# Patient Record
Sex: Male | Born: 1950 | Race: Black or African American | Hispanic: No | Marital: Single | Smoking: Former smoker
Health system: Southern US, Community
[De-identification: ages and names within clinical notes are randomized; demographics above are authoritative.]

## PROBLEM LIST (undated history)

## (undated) DIAGNOSIS — N2889 Other specified disorders of kidney and ureter: Secondary | ICD-10-CM

## (undated) DIAGNOSIS — C3490 Malignant neoplasm of unspecified part of unspecified bronchus or lung: Secondary | ICD-10-CM

## (undated) DIAGNOSIS — C189 Malignant neoplasm of colon, unspecified: Secondary | ICD-10-CM

## (undated) DIAGNOSIS — E119 Type 2 diabetes mellitus without complications: Secondary | ICD-10-CM

## (undated) DIAGNOSIS — J449 Chronic obstructive pulmonary disease, unspecified: Secondary | ICD-10-CM

## (undated) DIAGNOSIS — I639 Cerebral infarction, unspecified: Secondary | ICD-10-CM

## (undated) DIAGNOSIS — K219 Gastro-esophageal reflux disease without esophagitis: Secondary | ICD-10-CM

## (undated) DIAGNOSIS — J9690 Respiratory failure, unspecified, unspecified whether with hypoxia or hypercapnia: Secondary | ICD-10-CM

## (undated) DIAGNOSIS — Z8709 Personal history of other diseases of the respiratory system: Secondary | ICD-10-CM

## (undated) DIAGNOSIS — H409 Unspecified glaucoma: Secondary | ICD-10-CM

## (undated) DIAGNOSIS — I10 Essential (primary) hypertension: Secondary | ICD-10-CM

## (undated) DIAGNOSIS — C801 Malignant (primary) neoplasm, unspecified: Secondary | ICD-10-CM

## (undated) DIAGNOSIS — C8590 Non-Hodgkin lymphoma, unspecified, unspecified site: Secondary | ICD-10-CM

## (undated) DIAGNOSIS — G479 Sleep disorder, unspecified: Secondary | ICD-10-CM

## (undated) DIAGNOSIS — IMO0001 Reserved for inherently not codable concepts without codable children: Secondary | ICD-10-CM

## (undated) DIAGNOSIS — I499 Cardiac arrhythmia, unspecified: Secondary | ICD-10-CM

## (undated) DIAGNOSIS — C649 Malignant neoplasm of unspecified kidney, except renal pelvis: Secondary | ICD-10-CM

## (undated) DIAGNOSIS — Z9981 Dependence on supplemental oxygen: Secondary | ICD-10-CM

## (undated) DIAGNOSIS — J45909 Unspecified asthma, uncomplicated: Secondary | ICD-10-CM

## (undated) HISTORY — DX: Malignant (primary) neoplasm, unspecified: C80.1

## (undated) HISTORY — DX: Essential (primary) hypertension: I10

## (undated) HISTORY — DX: Unspecified glaucoma: H40.9

## (undated) HISTORY — DX: Non-Hodgkin lymphoma, unspecified, unspecified site: C85.90

## (undated) HISTORY — DX: Respiratory failure, unspecified, unspecified whether with hypoxia or hypercapnia: J96.90

## (undated) HISTORY — DX: Malignant neoplasm of unspecified part of unspecified bronchus or lung: C34.90

## (undated) HISTORY — DX: Malignant neoplasm of colon, unspecified: C18.9

## (undated) HISTORY — DX: Chronic obstructive pulmonary disease, unspecified: J44.9

## (undated) HISTORY — DX: Malignant neoplasm of unspecified kidney, except renal pelvis: C64.9

## (undated) HISTORY — DX: Type 2 diabetes mellitus without complications: E11.9

## (undated) HISTORY — DX: Cerebral infarction, unspecified: I63.9

---

## 1989-06-15 DIAGNOSIS — I639 Cerebral infarction, unspecified: Secondary | ICD-10-CM

## 1989-06-15 HISTORY — DX: Cerebral infarction, unspecified: I63.9

## 2004-10-18 ENCOUNTER — Ambulatory Visit: Payer: Self-pay | Admitting: Internal Medicine

## 2004-10-31 ENCOUNTER — Emergency Department (HOSPITAL_COMMUNITY): Admission: EM | Admit: 2004-10-31 | Discharge: 2004-10-31 | Payer: Self-pay | Admitting: Emergency Medicine

## 2005-01-04 ENCOUNTER — Emergency Department: Payer: Self-pay | Admitting: Internal Medicine

## 2005-06-12 ENCOUNTER — Emergency Department: Payer: Self-pay | Admitting: Internal Medicine

## 2005-06-12 ENCOUNTER — Other Ambulatory Visit: Payer: Self-pay

## 2006-05-23 ENCOUNTER — Other Ambulatory Visit: Payer: Self-pay

## 2006-05-23 ENCOUNTER — Emergency Department: Payer: Self-pay | Admitting: Emergency Medicine

## 2007-03-31 ENCOUNTER — Ambulatory Visit: Payer: Self-pay | Admitting: Internal Medicine

## 2007-03-31 ENCOUNTER — Inpatient Hospital Stay: Payer: Self-pay | Admitting: Surgery

## 2007-07-30 ENCOUNTER — Emergency Department (HOSPITAL_COMMUNITY): Admission: EM | Admit: 2007-07-30 | Discharge: 2007-07-30 | Payer: Self-pay | Admitting: Emergency Medicine

## 2007-09-01 ENCOUNTER — Emergency Department: Payer: Self-pay | Admitting: Emergency Medicine

## 2007-11-07 ENCOUNTER — Ambulatory Visit: Payer: Self-pay | Admitting: Gastroenterology

## 2010-01-20 ENCOUNTER — Ambulatory Visit: Payer: Self-pay | Admitting: Family Medicine

## 2010-01-26 ENCOUNTER — Ambulatory Visit: Payer: Self-pay | Admitting: Family Medicine

## 2010-02-06 ENCOUNTER — Ambulatory Visit: Payer: Self-pay | Admitting: Gastroenterology

## 2010-02-22 ENCOUNTER — Ambulatory Visit: Payer: Self-pay | Admitting: Specialist

## 2010-03-15 ENCOUNTER — Ambulatory Visit: Payer: Self-pay | Admitting: Cardiothoracic Surgery

## 2010-04-10 ENCOUNTER — Ambulatory Visit: Payer: Self-pay | Admitting: Specialist

## 2010-04-12 ENCOUNTER — Ambulatory Visit: Payer: Self-pay | Admitting: Cardiothoracic Surgery

## 2010-04-14 ENCOUNTER — Ambulatory Visit: Payer: Self-pay | Admitting: Cardiothoracic Surgery

## 2010-04-19 ENCOUNTER — Ambulatory Visit: Payer: Self-pay | Admitting: Specialist

## 2010-04-19 ENCOUNTER — Ambulatory Visit: Payer: Self-pay | Admitting: Oncology

## 2010-04-20 ENCOUNTER — Ambulatory Visit: Payer: Self-pay | Admitting: Cardiothoracic Surgery

## 2010-04-25 ENCOUNTER — Ambulatory Visit: Payer: Self-pay | Admitting: Oncology

## 2010-05-15 ENCOUNTER — Ambulatory Visit: Payer: Self-pay | Admitting: Cardiothoracic Surgery

## 2010-05-15 ENCOUNTER — Ambulatory Visit: Payer: Self-pay | Admitting: Oncology

## 2010-05-22 ENCOUNTER — Ambulatory Visit: Payer: Self-pay | Admitting: Surgery

## 2010-05-26 ENCOUNTER — Ambulatory Visit: Payer: Self-pay | Admitting: Surgery

## 2010-06-14 ENCOUNTER — Emergency Department: Payer: Self-pay | Admitting: Emergency Medicine

## 2010-06-15 ENCOUNTER — Ambulatory Visit: Payer: Self-pay | Admitting: Oncology

## 2010-06-15 ENCOUNTER — Ambulatory Visit: Payer: Self-pay | Admitting: Cardiothoracic Surgery

## 2010-07-12 ENCOUNTER — Inpatient Hospital Stay: Payer: Self-pay | Admitting: Internal Medicine

## 2010-07-12 DIAGNOSIS — J9690 Respiratory failure, unspecified, unspecified whether with hypoxia or hypercapnia: Secondary | ICD-10-CM

## 2010-07-12 HISTORY — DX: Respiratory failure, unspecified, unspecified whether with hypoxia or hypercapnia: J96.90

## 2010-07-15 ENCOUNTER — Ambulatory Visit: Payer: Self-pay | Admitting: Oncology

## 2010-07-15 ENCOUNTER — Ambulatory Visit: Payer: Self-pay | Admitting: Cardiothoracic Surgery

## 2010-08-15 ENCOUNTER — Ambulatory Visit: Payer: Self-pay | Admitting: Oncology

## 2010-08-15 ENCOUNTER — Ambulatory Visit: Payer: Self-pay | Admitting: Cardiothoracic Surgery

## 2010-09-14 ENCOUNTER — Ambulatory Visit: Payer: Self-pay | Admitting: Cardiothoracic Surgery

## 2010-09-14 ENCOUNTER — Ambulatory Visit: Payer: Self-pay | Admitting: Oncology

## 2010-10-15 ENCOUNTER — Ambulatory Visit: Payer: Self-pay | Admitting: Cardiothoracic Surgery

## 2010-10-15 ENCOUNTER — Ambulatory Visit: Payer: Self-pay | Admitting: Oncology

## 2010-11-15 ENCOUNTER — Ambulatory Visit: Payer: Self-pay | Admitting: Oncology

## 2010-12-14 ENCOUNTER — Ambulatory Visit: Payer: Self-pay | Admitting: Oncology

## 2011-01-14 ENCOUNTER — Ambulatory Visit: Payer: Self-pay | Admitting: Oncology

## 2011-01-14 ENCOUNTER — Ambulatory Visit: Payer: Self-pay

## 2011-02-13 ENCOUNTER — Ambulatory Visit: Payer: Self-pay | Admitting: Oncology

## 2011-03-16 ENCOUNTER — Ambulatory Visit: Payer: Self-pay | Admitting: Oncology

## 2011-04-03 ENCOUNTER — Ambulatory Visit: Payer: Self-pay | Admitting: Oncology

## 2011-04-15 ENCOUNTER — Ambulatory Visit: Payer: Self-pay | Admitting: Oncology

## 2011-05-17 ENCOUNTER — Ambulatory Visit: Payer: Self-pay | Admitting: Oncology

## 2011-06-16 ENCOUNTER — Ambulatory Visit: Payer: Self-pay | Admitting: Oncology

## 2011-06-26 ENCOUNTER — Encounter: Payer: Self-pay | Admitting: Specialist

## 2011-07-16 ENCOUNTER — Ambulatory Visit: Payer: Self-pay | Admitting: Oncology

## 2011-07-16 ENCOUNTER — Encounter: Payer: Self-pay | Admitting: Specialist

## 2011-07-17 ENCOUNTER — Emergency Department: Payer: Self-pay | Admitting: Emergency Medicine

## 2011-08-16 ENCOUNTER — Ambulatory Visit: Payer: Self-pay | Admitting: Oncology

## 2011-08-16 ENCOUNTER — Encounter: Payer: Self-pay | Admitting: Specialist

## 2011-09-15 ENCOUNTER — Ambulatory Visit: Payer: Self-pay | Admitting: Oncology

## 2011-09-15 ENCOUNTER — Encounter: Payer: Self-pay | Admitting: Specialist

## 2011-10-16 ENCOUNTER — Ambulatory Visit: Payer: Self-pay | Admitting: Oncology

## 2011-10-16 ENCOUNTER — Encounter: Payer: Self-pay | Admitting: Specialist

## 2011-11-16 ENCOUNTER — Ambulatory Visit: Payer: Self-pay | Admitting: Oncology

## 2011-11-18 ENCOUNTER — Emergency Department: Payer: Self-pay | Admitting: *Deleted

## 2011-11-18 LAB — TROPONIN I: Troponin-I: <0.02 ng/mL

## 2011-11-18 LAB — CBC
HCT: 30.6 % — ABNORMAL LOW (ref 40.0–52.0)
HGB: 10.5 g/dL — ABNORMAL LOW (ref 13.0–18.0)
MCH: 28.7 pg (ref 26.0–34.0)
RBC: 3.65 10*6/uL — ABNORMAL LOW (ref 4.40–5.90)

## 2011-11-18 LAB — COMPREHENSIVE METABOLIC PANEL
Albumin: 2.9 g/dL — ABNORMAL LOW (ref 3.4–5.0)
BUN: 17 mg/dL (ref 7–18)
Bilirubin,Total: 0.3 mg/dL (ref 0.2–1.0)
Chloride: 101 mmol/L (ref 98–107)
EGFR (African American): >60
EGFR (Non-African Amer.): >60
Glucose: 154 mg/dL — ABNORMAL HIGH (ref 65–99)
Osmolality: 280 (ref 275–301)
Potassium: 3 mmol/L — ABNORMAL LOW (ref 3.5–5.1)
Sodium: 138 mmol/L (ref 136–145)
Total Protein: 6.7 g/dL (ref 6.4–8.2)

## 2011-11-18 LAB — CK TOTAL AND CKMB (NOT AT ARMC): CK, Total: 681 U/L — ABNORMAL HIGH (ref 35–232)

## 2011-11-18 LAB — PRO B NATRIURETIC PEPTIDE: B-Type Natriuretic Peptide: 1297 pg/mL — ABNORMAL HIGH (ref 0–125)

## 2011-12-27 ENCOUNTER — Ambulatory Visit: Payer: Self-pay | Admitting: Oncology

## 2011-12-27 LAB — COMPREHENSIVE METABOLIC PANEL
Alkaline Phosphatase: 89 U/L (ref 50–136)
Bilirubin,Total: 0.3 mg/dL (ref 0.2–1.0)
Calcium, Total: 8.7 mg/dL (ref 8.5–10.1)
Glucose: 119 mg/dL — ABNORMAL HIGH (ref 65–99)
Osmolality: 279 (ref 275–301)
Potassium: 3.7 mmol/L (ref 3.5–5.1)
SGOT(AST): 16 U/L (ref 15–37)
SGPT (ALT): 18 U/L
Sodium: 138 mmol/L (ref 136–145)

## 2011-12-27 LAB — CBC CANCER CENTER
Comment - H1-Com2: NORMAL
Eosinophil: 5 %
HGB: 10.4 g/dL — ABNORMAL LOW (ref 13.0–18.0)
MCV: 83 fL (ref 80–100)
Monocytes: 8 %
Platelet: 231 x10 3/mm (ref 150–440)
RBC: 3.85 10*6/uL — ABNORMAL LOW (ref 4.40–5.90)
Segmented Neutrophils: 60 %
Variant Lymphocyte: 3 %
WBC: 5.6 x10 3/mm (ref 3.8–10.6)

## 2011-12-31 ENCOUNTER — Ambulatory Visit: Payer: Self-pay | Admitting: Oncology

## 2012-01-01 LAB — FERRITIN: Ferritin (ARMC): 255 ng/mL (ref 8–388)

## 2012-01-01 LAB — IRON AND TIBC: Unbound Iron-Bind.Cap.: 225 ug/dL

## 2012-01-14 ENCOUNTER — Ambulatory Visit: Payer: Self-pay | Admitting: Oncology

## 2012-01-15 LAB — COMPREHENSIVE METABOLIC PANEL
Alkaline Phosphatase: 78 U/L (ref 50–136)
Bilirubin,Total: 0.4 mg/dL (ref 0.2–1.0)
Co2: 31 mmol/L (ref 21–32)
EGFR (African American): >60
EGFR (Non-African Amer.): >60
Potassium: 3.8 mmol/L (ref 3.5–5.1)
Sodium: 140 mmol/L (ref 136–145)
Total Protein: 7.3 g/dL (ref 6.4–8.2)

## 2012-01-15 LAB — CBC CANCER CENTER
Eosinophil: 3 %
HGB: 10.4 g/dL — ABNORMAL LOW (ref 13.0–18.0)
Lymphocytes: 20 %
MCH: 27.5 pg (ref 26.0–34.0)
MCHC: 33.4 g/dL (ref 32.0–36.0)
MCV: 83 fL (ref 80–100)
Platelet: 199 x10 3/mm (ref 150–440)
RDW: 16.6 % — ABNORMAL HIGH (ref 11.5–14.5)
Segmented Neutrophils: 69 %

## 2012-01-29 LAB — CBC CANCER CENTER
Basophil #: 0 x10 3/mm (ref 0.0–0.1)
HGB: 10.9 g/dL — ABNORMAL LOW (ref 13.0–18.0)
Lymphocyte %: 43.9 %
MCHC: 31.9 g/dL — ABNORMAL LOW (ref 32.0–36.0)
Monocyte #: 0.2 x10 3/mm (ref 0.2–1.0)
Monocyte %: 4.5 %
Platelet: 136 x10 3/mm — ABNORMAL LOW (ref 150–440)

## 2012-02-05 LAB — CBC CANCER CENTER
Basophil #: 0 x10 3/mm (ref 0.0–0.1)
Eosinophil #: 0.1 x10 3/mm (ref 0.0–0.7)
Eosinophil %: 2.2 %
HCT: 33.2 % — ABNORMAL LOW (ref 40.0–52.0)
HGB: 10.6 g/dL — ABNORMAL LOW (ref 13.0–18.0)
Lymphocyte #: 1.3 x10 3/mm (ref 1.0–3.6)
Lymphocyte %: 32.4 %
MCHC: 32 g/dL (ref 32.0–36.0)
MCV: 84 fL (ref 80–100)
Neutrophil #: 2.1 x10 3/mm (ref 1.4–6.5)
Neutrophil %: 52 %
Platelet: 116 x10 3/mm — ABNORMAL LOW (ref 150–440)
RDW: 17.2 % — ABNORMAL HIGH (ref 11.5–14.5)
WBC: 4.1 x10 3/mm (ref 3.8–10.6)

## 2012-02-05 LAB — COMPREHENSIVE METABOLIC PANEL WITH GFR
Albumin: 3.5 g/dL
Alkaline Phosphatase: 87 U/L
Anion Gap: 10
BUN: 20 mg/dL — ABNORMAL HIGH
Bilirubin,Total: 0.2 mg/dL
Calcium, Total: 8.6 mg/dL
Chloride: 101 mmol/L
Co2: 30 mmol/L
Creatinine: 1.38 mg/dL — ABNORMAL HIGH
EGFR (African American): >60
EGFR (Non-African Amer.): 55 — ABNORMAL LOW
Glucose: 121 mg/dL — ABNORMAL HIGH
Osmolality: 285
Potassium: 3.7 mmol/L
SGOT(AST): 14 U/L — ABNORMAL LOW
SGPT (ALT): 19 U/L
Sodium: 141 mmol/L
Total Protein: 7.2 g/dL

## 2012-02-05 LAB — HEMOGLOBIN A1C: Hemoglobin A1C: 6.9 % — ABNORMAL HIGH

## 2012-02-12 LAB — CBC CANCER CENTER
Basophil #: 0 x10 3/mm (ref 0.0–0.1)
Basophil %: 0.9 %
Eosinophil #: 0.1 x10 3/mm (ref 0.0–0.7)
Eosinophil %: 1.8 %
HCT: 31.9 % — ABNORMAL LOW (ref 40.0–52.0)
HGB: 10.3 g/dL — ABNORMAL LOW (ref 13.0–18.0)
Lymphocyte #: 1.4 x10 3/mm (ref 1.0–3.6)
Lymphocyte %: 40.2 %
MCH: 26.9 pg (ref 26.0–34.0)
MCHC: 32.2 g/dL (ref 32.0–36.0)
MCV: 84 fL (ref 80–100)
Monocyte #: 0.4 x10 3/mm (ref 0.2–1.0)
Monocyte %: 11.9 %
Neutrophil #: 1.5 x10 3/mm (ref 1.4–6.5)
Neutrophil %: 45.2 %
Platelet: 247 x10 3/mm (ref 150–440)
RBC: 3.81 10*6/uL — ABNORMAL LOW (ref 4.40–5.90)
RDW: 17.2 % — ABNORMAL HIGH (ref 11.5–14.5)
WBC: 3.4 x10 3/mm — ABNORMAL LOW (ref 3.8–10.6)

## 2012-02-12 LAB — BASIC METABOLIC PANEL
Anion Gap: 7 (ref 7–16)
BUN: 16 mg/dL (ref 7–18)
Calcium, Total: 8.7 mg/dL (ref 8.5–10.1)
Chloride: 102 mmol/L (ref 98–107)
Co2: 32 mmol/L (ref 21–32)
Creatinine: 1.18 mg/dL (ref 0.60–1.30)
EGFR (African American): >60
EGFR (Non-African Amer.): >60
Glucose: 141 mg/dL — ABNORMAL HIGH (ref 65–99)
Osmolality: 285 (ref 275–301)
Potassium: 3.7 mmol/L (ref 3.5–5.1)
Sodium: 141 mmol/L (ref 136–145)

## 2012-02-13 ENCOUNTER — Ambulatory Visit: Payer: Self-pay | Admitting: Oncology

## 2012-02-19 ENCOUNTER — Emergency Department: Payer: Self-pay | Admitting: Emergency Medicine

## 2012-02-19 LAB — CBC CANCER CENTER
HGB: 9.8 g/dL — ABNORMAL LOW (ref 13.0–18.0)
Lymphocyte #: 1 x10 3/mm (ref 1.0–3.6)
Lymphocyte %: 26.7 %
MCV: 85 fL (ref 80–100)
Monocyte #: 0.5 x10 3/mm (ref 0.2–1.0)
Monocyte %: 13 %
RBC: 3.62 10*6/uL — ABNORMAL LOW (ref 4.40–5.90)
RDW: 18.2 % — ABNORMAL HIGH (ref 11.5–14.5)

## 2012-02-19 LAB — COMPREHENSIVE METABOLIC PANEL
Anion Gap: 8 (ref 7–16)
BUN: 17 mg/dL (ref 7–18)
Bilirubin,Total: 0.2 mg/dL (ref 0.2–1.0)
Creatinine: 1.06 mg/dL (ref 0.60–1.30)
EGFR (African American): >60
EGFR (Non-African Amer.): >60
Glucose: 137 mg/dL — ABNORMAL HIGH (ref 65–99)
SGOT(AST): 20 U/L (ref 15–37)
SGPT (ALT): 25 U/L

## 2012-02-26 LAB — CBC CANCER CENTER
Basophil %: 0.4 %
Eosinophil #: 0.1 x10 3/mm (ref 0.0–0.7)
HGB: 10 g/dL — ABNORMAL LOW (ref 13.0–18.0)
Lymphocyte #: 0.7 x10 3/mm — ABNORMAL LOW (ref 1.0–3.6)
Lymphocyte %: 12.9 %
MCH: 27.1 pg (ref 26.0–34.0)
MCHC: 32.2 g/dL (ref 32.0–36.0)
MCV: 84 fL (ref 80–100)
Monocyte %: 5.9 %
Platelet: 121 x10 3/mm — ABNORMAL LOW (ref 150–440)
RBC: 3.71 10*6/uL — ABNORMAL LOW (ref 4.40–5.90)
RDW: 18.3 % — ABNORMAL HIGH (ref 11.5–14.5)
WBC: 5.1 x10 3/mm (ref 3.8–10.6)

## 2012-03-04 LAB — CBC CANCER CENTER
Basophil #: 0 x10 3/mm (ref 0.0–0.1)
Basophil %: 0.2 %
Eosinophil %: 1 %
HCT: 30.8 % — ABNORMAL LOW (ref 40.0–52.0)
Lymphocyte #: 1.3 x10 3/mm (ref 1.0–3.6)
Lymphocyte %: 22 %
Monocyte #: 0.7 x10 3/mm (ref 0.2–1.0)
Neutrophil #: 3.8 x10 3/mm (ref 1.4–6.5)
Neutrophil %: 64.5 %
Platelet: 171 x10 3/mm (ref 150–440)
WBC: 5.9 x10 3/mm (ref 3.8–10.6)

## 2012-03-11 LAB — CBC CANCER CENTER
Basophil #: 0 x10 3/mm (ref 0.0–0.1)
Basophil %: 0.9 %
Neutrophil #: 1.8 x10 3/mm (ref 1.4–6.5)
Neutrophil %: 44.6 %
Platelet: 232 x10 3/mm (ref 150–440)

## 2012-03-15 ENCOUNTER — Ambulatory Visit: Payer: Self-pay | Admitting: Oncology

## 2012-03-18 LAB — CBC CANCER CENTER
Basophil #: 0.1 x10 3/mm (ref 0.0–0.1)
Basophil %: 2.4 %
Eosinophil #: 0.1 x10 3/mm (ref 0.0–0.7)
HCT: 32.1 % — ABNORMAL LOW (ref 40.0–52.0)
Lymphocyte %: 33.8 %
MCH: 27.4 pg (ref 26.0–34.0)
MCV: 86 fL (ref 80–100)
Monocyte #: 0.5 x10 3/mm (ref 0.2–1.0)
Monocyte %: 11.1 %
Neutrophil %: 50 %
Platelet: 190 x10 3/mm (ref 150–440)
RBC: 3.75 10*6/uL — ABNORMAL LOW (ref 4.40–5.90)
RDW: 19.2 % — ABNORMAL HIGH (ref 11.5–14.5)
WBC: 4.2 x10 3/mm (ref 3.8–10.6)

## 2012-03-25 LAB — CBC CANCER CENTER
Basophil %: 0.7 %
Eosinophil %: 2.4 %
HCT: 30.3 % — ABNORMAL LOW (ref 40.0–52.0)
Lymphocyte %: 36.2 %
MCH: 27.5 pg (ref 26.0–34.0)
MCHC: 31.9 g/dL — ABNORMAL LOW (ref 32.0–36.0)
MCV: 86 fL (ref 80–100)
Monocyte #: 0.2 x10 3/mm (ref 0.2–1.0)
Neutrophil %: 52.1 %
Platelet: 117 x10 3/mm — ABNORMAL LOW (ref 150–440)
RBC: 3.51 10*6/uL — ABNORMAL LOW (ref 4.40–5.90)
RDW: 18.5 % — ABNORMAL HIGH (ref 11.5–14.5)
WBC: 2.8 x10 3/mm — ABNORMAL LOW (ref 3.8–10.6)

## 2012-04-01 LAB — CBC CANCER CENTER
Basophil #: 0 x10 3/mm (ref 0.0–0.1)
Eosinophil %: 2.6 %
HCT: 27.2 % — ABNORMAL LOW (ref 40.0–52.0)
HGB: 8.9 g/dL — ABNORMAL LOW (ref 13.0–18.0)
Lymphocyte #: 1 x10 3/mm (ref 1.0–3.6)
MCHC: 32.7 g/dL (ref 32.0–36.0)
Monocyte #: 1 x10 3/mm (ref 0.2–1.0)
Neutrophil #: 2.3 x10 3/mm (ref 1.4–6.5)
Neutrophil %: 52.4 %
Platelet: 205 x10 3/mm (ref 150–440)
WBC: 4.5 x10 3/mm (ref 3.8–10.6)

## 2012-04-04 LAB — COMPREHENSIVE METABOLIC PANEL
Anion Gap: 9 (ref 7–16)
BUN: 17 mg/dL (ref 7–18)
Chloride: 99 mmol/L (ref 98–107)
EGFR (African American): 54 — ABNORMAL LOW
EGFR (Non-African Amer.): 46 — ABNORMAL LOW
Glucose: 164 mg/dL — ABNORMAL HIGH (ref 65–99)
Osmolality: 279 (ref 275–301)
Potassium: 3 mmol/L — ABNORMAL LOW (ref 3.5–5.1)
SGOT(AST): 26 U/L (ref 15–37)
SGPT (ALT): 19 U/L
Sodium: 137 mmol/L (ref 136–145)
Total Protein: 7.5 g/dL (ref 6.4–8.2)

## 2012-04-04 LAB — CBC
HCT: 28.6 % — ABNORMAL LOW (ref 40.0–52.0)
HGB: 9.1 g/dL — ABNORMAL LOW (ref 13.0–18.0)
MCH: 27.4 pg (ref 26.0–34.0)
MCHC: 31.8 g/dL — ABNORMAL LOW (ref 32.0–36.0)
Platelet: 322 10*3/uL (ref 150–440)
RBC: 3.32 10*6/uL — ABNORMAL LOW (ref 4.40–5.90)

## 2012-04-04 LAB — TROPONIN I: Troponin-I: 0.04 ng/mL

## 2012-04-05 ENCOUNTER — Inpatient Hospital Stay: Payer: Self-pay | Admitting: Specialist

## 2012-04-05 LAB — CK TOTAL AND CKMB (NOT AT ARMC)
CK, Total: 415 U/L — ABNORMAL HIGH (ref 35–232)
CK, Total: 430 U/L — ABNORMAL HIGH (ref 35–232)
CK-MB: 4.2 ng/mL — ABNORMAL HIGH (ref 0.5–3.6)
CK-MB: 7.3 ng/mL — ABNORMAL HIGH (ref 0.5–3.6)

## 2012-04-05 LAB — TROPONIN I
Troponin-I: <0.02 ng/mL
Troponin-I: <0.02 ng/mL

## 2012-04-06 LAB — CBC WITH DIFFERENTIAL/PLATELET
Basophil #: 0 10*3/uL (ref 0.0–0.1)
Basophil %: 0.3 %
Eosinophil #: 0 10*3/uL (ref 0.0–0.7)
Lymphocyte #: 0.1 10*3/uL — ABNORMAL LOW (ref 1.0–3.6)
Lymphocyte %: 0.9 %
MCH: 27.5 pg (ref 26.0–34.0)
MCV: 85 fL (ref 80–100)
Monocyte #: 1.1 x10 3/mm — ABNORMAL HIGH (ref 0.2–1.0)
Monocyte %: 12.2 %
Neutrophil #: 8 10*3/uL — ABNORMAL HIGH (ref 1.4–6.5)
Neutrophil %: 86.5 %
Platelet: 288 10*3/uL (ref 150–440)
RBC: 2.88 10*6/uL — ABNORMAL LOW (ref 4.40–5.90)
RDW: 18.3 % — ABNORMAL HIGH (ref 11.5–14.5)
WBC: 9.3 10*3/uL (ref 3.8–10.6)

## 2012-04-06 LAB — BASIC METABOLIC PANEL
BUN: 18 mg/dL (ref 7–18)
Calcium, Total: 7.6 mg/dL — ABNORMAL LOW (ref 8.5–10.1)
Chloride: 101 mmol/L (ref 98–107)
Glucose: 167 mg/dL — ABNORMAL HIGH (ref 65–99)
Osmolality: 283 (ref 275–301)
Sodium: 139 mmol/L (ref 136–145)

## 2012-04-06 LAB — MAGNESIUM: Magnesium: 1.2 mg/dL — ABNORMAL LOW

## 2012-04-10 LAB — CULTURE, BLOOD (SINGLE)

## 2012-04-14 ENCOUNTER — Ambulatory Visit: Payer: Self-pay | Admitting: Oncology

## 2012-04-15 LAB — CBC CANCER CENTER
Bands: 2 %
MCH: 27.9 pg (ref 26.0–34.0)
MCHC: 32 g/dL (ref 32.0–36.0)
MCV: 87 fL (ref 80–100)
Platelet: 190 x10 3/mm (ref 150–440)
RBC: 3.55 10*6/uL — ABNORMAL LOW (ref 4.40–5.90)
RDW: 19.8 % — ABNORMAL HIGH (ref 11.5–14.5)

## 2012-04-15 LAB — COMPREHENSIVE METABOLIC PANEL
Albumin: 3.3 g/dL — ABNORMAL LOW (ref 3.4–5.0)
Anion Gap: 7 (ref 7–16)
BUN: 11 mg/dL (ref 7–18)
Glucose: 121 mg/dL — ABNORMAL HIGH (ref 65–99)
Osmolality: 274 (ref 275–301)
Potassium: 3.4 mmol/L — ABNORMAL LOW (ref 3.5–5.1)
SGOT(AST): 16 U/L (ref 15–37)
Sodium: 137 mmol/L (ref 136–145)
Total Protein: 7.1 g/dL (ref 6.4–8.2)

## 2012-04-15 LAB — MAGNESIUM: Magnesium: 1.4 mg/dL — ABNORMAL LOW

## 2012-05-06 LAB — CBC CANCER CENTER
Basophil #: 0 10*3/uL
Basophil %: 0.8 %
Eosinophil #: 0.4 10*3/uL
Eosinophil %: 8.7 %
HCT: 29.9 % — ABNORMAL LOW
HGB: 9.5 g/dL — ABNORMAL LOW
Lymphocyte %: 33.3 %
Lymphs Abs: 1.4 10*3/uL
MCH: 28.2 pg
MCHC: 31.8 g/dL — ABNORMAL LOW
MCV: 89 fL
Monocyte #: 0.4 10*3/uL
Monocyte %: 9.8 %
Neutrophil #: 2 10*3/uL
Neutrophil %: 47.4 %
Platelet: 228 10*3/uL
RBC: 3.36 10*6/uL — ABNORMAL LOW
RDW: 17.8 % — ABNORMAL HIGH
WBC: 4.1 10*3/uL

## 2012-05-06 LAB — COMPREHENSIVE METABOLIC PANEL
Albumin: 3.5 g/dL (ref 3.4–5.0)
BUN: 16 mg/dL (ref 7–18)
Calcium, Total: 9.1 mg/dL (ref 8.5–10.1)
EGFR (African American): >60
EGFR (Non-African Amer.): 54 — ABNORMAL LOW
Glucose: 183 mg/dL — ABNORMAL HIGH (ref 65–99)
SGOT(AST): 19 U/L (ref 15–37)
SGPT (ALT): 16 U/L
Total Protein: 7.4 g/dL (ref 6.4–8.2)

## 2012-05-15 ENCOUNTER — Ambulatory Visit: Payer: Self-pay | Admitting: Oncology

## 2012-06-03 LAB — COMPREHENSIVE METABOLIC PANEL
Anion Gap: 8 (ref 7–16)
Bilirubin,Total: 0.2 mg/dL (ref 0.2–1.0)
Chloride: 100 mmol/L (ref 98–107)
EGFR (African American): >60
EGFR (Non-African Amer.): 57 — ABNORMAL LOW
Osmolality: 282 (ref 275–301)
Potassium: 3.7 mmol/L (ref 3.5–5.1)
Sodium: 140 mmol/L (ref 136–145)
Total Protein: 7.8 g/dL (ref 6.4–8.2)

## 2012-06-03 LAB — CBC CANCER CENTER
Basophil #: 0 x10 3/mm (ref 0.0–0.1)
Eosinophil #: 0.2 x10 3/mm (ref 0.0–0.7)
HGB: 10.2 g/dL — ABNORMAL LOW (ref 13.0–18.0)
Lymphocyte %: 27.2 %
MCHC: 31.4 g/dL — ABNORMAL LOW (ref 32.0–36.0)
Neutrophil %: 58 %
Platelet: 181 x10 3/mm (ref 150–440)
RDW: 15.7 % — ABNORMAL HIGH (ref 11.5–14.5)

## 2012-06-15 ENCOUNTER — Ambulatory Visit: Payer: Self-pay | Admitting: Oncology

## 2012-07-15 ENCOUNTER — Ambulatory Visit: Payer: Self-pay | Admitting: Oncology

## 2012-08-15 ENCOUNTER — Ambulatory Visit: Payer: Self-pay | Admitting: Oncology

## 2012-09-03 LAB — CBC CANCER CENTER
Basophil #: 0 x10 3/mm (ref 0.0–0.1)
HCT: 34.4 % — ABNORMAL LOW (ref 40.0–52.0)
Lymphocyte %: 41.2 %
Monocyte %: 10.1 %
Platelet: 194 x10 3/mm (ref 150–440)
RDW: 16.2 % — ABNORMAL HIGH (ref 11.5–14.5)
WBC: 5.4 x10 3/mm (ref 3.8–10.6)

## 2012-09-03 LAB — COMPREHENSIVE METABOLIC PANEL
Anion Gap: 10 (ref 7–16)
BUN: 18 mg/dL (ref 7–18)
Bilirubin,Total: 0.3 mg/dL (ref 0.2–1.0)
Calcium, Total: 9 mg/dL (ref 8.5–10.1)
Chloride: 99 mmol/L (ref 98–107)
Co2: 28 mmol/L (ref 21–32)
EGFR (African American): 57 — ABNORMAL LOW
EGFR (Non-African Amer.): 49 — ABNORMAL LOW
Glucose: 190 mg/dL — ABNORMAL HIGH (ref 65–99)
Osmolality: 281 (ref 275–301)

## 2012-09-14 ENCOUNTER — Ambulatory Visit: Payer: Self-pay | Admitting: Oncology

## 2012-10-15 ENCOUNTER — Ambulatory Visit: Payer: Self-pay | Admitting: Oncology

## 2012-10-22 ENCOUNTER — Observation Stay: Payer: Self-pay | Admitting: Internal Medicine

## 2012-10-22 LAB — COMPREHENSIVE METABOLIC PANEL
Alkaline Phosphatase: 85 U/L (ref 50–136)
Anion Gap: 7 (ref 7–16)
BUN: 19 mg/dL — ABNORMAL HIGH (ref 7–18)
Bilirubin,Total: 0.2 mg/dL (ref 0.2–1.0)
Co2: 29 mmol/L (ref 21–32)
Creatinine: 1.17 mg/dL (ref 0.60–1.30)
EGFR (Non-African Amer.): >60
Osmolality: 282 (ref 275–301)
Potassium: 4.1 mmol/L (ref 3.5–5.1)

## 2012-10-22 LAB — CBC
MCV: 83 fL (ref 80–100)
Platelet: 182 10*3/uL (ref 150–440)
RBC: 4.02 10*6/uL — ABNORMAL LOW (ref 4.40–5.90)
WBC: 11.4 10*3/uL — ABNORMAL HIGH (ref 3.8–10.6)

## 2012-10-23 LAB — THEOPHYLLINE LEVEL: Theophylline: 3.3 ug/mL — ABNORMAL LOW (ref 10.0–20.0)

## 2012-11-15 ENCOUNTER — Ambulatory Visit: Payer: Self-pay | Admitting: Oncology

## 2012-11-17 LAB — CBC CANCER CENTER
Basophil %: 0.1 %
Eosinophil #: 0 x10 3/mm (ref 0.0–0.7)
Eosinophil %: 0.1 %
HGB: 11.6 g/dL — ABNORMAL LOW (ref 13.0–18.0)
MCH: 27.6 pg (ref 26.0–34.0)
MCV: 84 fL (ref 80–100)
Monocyte #: 1.1 x10 3/mm — ABNORMAL HIGH (ref 0.2–1.0)
Platelet: 216 x10 3/mm (ref 150–440)
RDW: 17.5 % — ABNORMAL HIGH (ref 11.5–14.5)
WBC: 13.3 x10 3/mm — ABNORMAL HIGH (ref 3.8–10.6)

## 2012-11-17 LAB — COMPREHENSIVE METABOLIC PANEL
Albumin: 3.4 g/dL (ref 3.4–5.0)
Alkaline Phosphatase: 77 U/L (ref 50–136)
BUN: 21 mg/dL — ABNORMAL HIGH (ref 7–18)
Bilirubin,Total: 0.4 mg/dL (ref 0.2–1.0)
Co2: 33 mmol/L — ABNORMAL HIGH (ref 21–32)
Creatinine: 1.18 mg/dL (ref 0.60–1.30)
EGFR (Non-African Amer.): >60
Glucose: 147 mg/dL — ABNORMAL HIGH (ref 65–99)
Osmolality: 281 (ref 275–301)
Potassium: 4 mmol/L (ref 3.5–5.1)
SGPT (ALT): 20 U/L (ref 12–78)
Sodium: 138 mmol/L (ref 136–145)
Total Protein: 7.4 g/dL (ref 6.4–8.2)

## 2012-12-13 ENCOUNTER — Ambulatory Visit: Payer: Self-pay | Admitting: Oncology

## 2012-12-23 ENCOUNTER — Emergency Department: Payer: Self-pay | Admitting: Emergency Medicine

## 2012-12-23 LAB — CBC CANCER CENTER
HCT: 33.8 % — ABNORMAL LOW (ref 40.0–52.0)
HGB: 11.2 g/dL — ABNORMAL LOW (ref 13.0–18.0)
MCH: 28.3 pg (ref 26.0–34.0)
MCV: 85 fL (ref 80–100)
Monocyte #: 0.6 x10 3/mm (ref 0.2–1.0)
Neutrophil #: 3.9 x10 3/mm (ref 1.4–6.5)
Neutrophil %: 51.3 %
WBC: 7.5 x10 3/mm (ref 3.8–10.6)

## 2012-12-23 LAB — COMPREHENSIVE METABOLIC PANEL
Albumin: 3.4 g/dL (ref 3.4–5.0)
Alkaline Phosphatase: 72 U/L (ref 50–136)
BUN: 9 mg/dL (ref 7–18)
Bilirubin,Total: 0.3 mg/dL (ref 0.2–1.0)
Calcium, Total: 8.8 mg/dL (ref 8.5–10.1)
Chloride: 100 mmol/L (ref 98–107)
Creatinine: 1.3 mg/dL (ref 0.60–1.30)
EGFR (African American): >60
EGFR (Non-African Amer.): 59 — ABNORMAL LOW
Glucose: 129 mg/dL — ABNORMAL HIGH (ref 65–99)
Sodium: 140 mmol/L (ref 136–145)
Total Protein: 6.9 g/dL (ref 6.4–8.2)

## 2013-01-13 ENCOUNTER — Ambulatory Visit: Payer: Self-pay | Admitting: Oncology

## 2013-01-14 LAB — CBC CANCER CENTER
Eosinophil %: 0.3 %
HGB: 11.3 g/dL — ABNORMAL LOW (ref 13.0–18.0)
Lymphocyte #: 3.4 x10 3/mm (ref 1.0–3.6)
Lymphocyte %: 42.7 %
MCH: 27.5 pg (ref 26.0–34.0)
MCV: 86 fL (ref 80–100)
Monocyte %: 5.6 %
Neutrophil %: 51.3 %
Platelet: 177 x10 3/mm (ref 150–440)
RBC: 4.12 10*6/uL — ABNORMAL LOW (ref 4.40–5.90)
RDW: 16.1 % — ABNORMAL HIGH (ref 11.5–14.5)
WBC: 7.8 x10 3/mm (ref 3.8–10.6)

## 2013-01-14 LAB — COMPREHENSIVE METABOLIC PANEL
Alkaline Phosphatase: 73 U/L (ref 50–136)
BUN: 15 mg/dL (ref 7–18)
Bilirubin,Total: 0.4 mg/dL (ref 0.2–1.0)
Calcium, Total: 8.7 mg/dL (ref 8.5–10.1)
Chloride: 98 mmol/L (ref 98–107)
Creatinine: 1.45 mg/dL — ABNORMAL HIGH (ref 0.60–1.30)
EGFR (African American): 60 — ABNORMAL LOW
EGFR (Non-African Amer.): 52 — ABNORMAL LOW
Osmolality: 274 (ref 275–301)
SGOT(AST): 15 U/L (ref 15–37)
SGPT (ALT): 20 U/L (ref 12–78)
Total Protein: 7.3 g/dL (ref 6.4–8.2)

## 2013-02-12 ENCOUNTER — Ambulatory Visit: Payer: Self-pay | Admitting: Oncology

## 2013-02-12 LAB — COMPREHENSIVE METABOLIC PANEL
Albumin: 3.5 g/dL (ref 3.4–5.0)
Anion Gap: 10 (ref 7–16)
Calcium, Total: 9.5 mg/dL (ref 8.5–10.1)
Chloride: 97 mmol/L — ABNORMAL LOW (ref 98–107)
Co2: 30 mmol/L (ref 21–32)
Osmolality: 281 (ref 275–301)
SGPT (ALT): 18 U/L (ref 12–78)
Sodium: 137 mmol/L (ref 136–145)

## 2013-02-12 LAB — CBC CANCER CENTER
Basophil %: 0.8 %
Lymphocyte %: 38.7 %
MCH: 27.5 pg (ref 26.0–34.0)
Monocyte #: 0.4 x10 3/mm (ref 0.2–1.0)
Monocyte %: 4.6 %
Neutrophil %: 55.4 %
RDW: 16.6 % — ABNORMAL HIGH (ref 11.5–14.5)
WBC: 8.7 x10 3/mm (ref 3.8–10.6)

## 2013-03-10 ENCOUNTER — Ambulatory Visit: Payer: Self-pay | Admitting: Oncology

## 2013-03-12 LAB — CBC CANCER CENTER
Basophil #: 0.1 x10 3/mm (ref 0.0–0.1)
Eosinophil %: 0.3 %
HGB: 11.6 g/dL — ABNORMAL LOW (ref 13.0–18.0)
MCH: 28.4 pg (ref 26.0–34.0)
Monocyte %: 4.9 %
Neutrophil %: 67.1 %
Platelet: 197 x10 3/mm (ref 150–440)
RBC: 4.07 10*6/uL — ABNORMAL LOW (ref 4.40–5.90)
RDW: 16.4 % — ABNORMAL HIGH (ref 11.5–14.5)

## 2013-03-12 LAB — COMPREHENSIVE METABOLIC PANEL
Albumin: 3.4 g/dL (ref 3.4–5.0)
BUN: 16 mg/dL (ref 7–18)
Co2: 31 mmol/L (ref 21–32)
Osmolality: 278 (ref 275–301)
Potassium: 3.9 mmol/L (ref 3.5–5.1)
SGPT (ALT): 18 U/L (ref 12–78)
Sodium: 135 mmol/L — ABNORMAL LOW (ref 136–145)

## 2013-03-15 ENCOUNTER — Ambulatory Visit: Payer: Self-pay | Admitting: Oncology

## 2013-04-14 ENCOUNTER — Ambulatory Visit: Payer: Self-pay | Admitting: Oncology

## 2013-04-30 LAB — COMPREHENSIVE METABOLIC PANEL
Anion Gap: 4 — ABNORMAL LOW (ref 7–16)
BUN: 19 mg/dL — ABNORMAL HIGH (ref 7–18)
Bilirubin,Total: 0.3 mg/dL (ref 0.2–1.0)
Calcium, Total: 9.3 mg/dL (ref 8.5–10.1)
Chloride: 101 mmol/L (ref 98–107)
EGFR (African American): >60
Glucose: 165 mg/dL — ABNORMAL HIGH (ref 65–99)
Osmolality: 280 (ref 275–301)
Potassium: 3.4 mmol/L — ABNORMAL LOW (ref 3.5–5.1)
SGOT(AST): 11 U/L — ABNORMAL LOW (ref 15–37)
SGPT (ALT): 15 U/L (ref 12–78)
Sodium: 137 mmol/L (ref 136–145)
Total Protein: 7.2 g/dL (ref 6.4–8.2)

## 2013-04-30 LAB — CBC CANCER CENTER
Basophil #: 0.1 x10 3/mm (ref 0.0–0.1)
Eosinophil %: 0.7 %
HGB: 10.9 g/dL — ABNORMAL LOW (ref 13.0–18.0)
Lymphocyte #: 2.6 x10 3/mm (ref 1.0–3.6)
MCH: 27.7 pg (ref 26.0–34.0)
MCV: 85 fL (ref 80–100)
Monocyte #: 0.9 x10 3/mm (ref 0.2–1.0)
Monocyte %: 6.7 %
Neutrophil #: 9.4 x10 3/mm — ABNORMAL HIGH (ref 1.4–6.5)
Neutrophil %: 72.2 %
Platelet: 272 x10 3/mm (ref 150–440)
RBC: 3.95 10*6/uL — ABNORMAL LOW (ref 4.40–5.90)
WBC: 13.1 x10 3/mm — ABNORMAL HIGH (ref 3.8–10.6)

## 2013-05-15 ENCOUNTER — Ambulatory Visit: Payer: Self-pay | Admitting: Oncology

## 2013-06-11 LAB — COMPREHENSIVE METABOLIC PANEL
Albumin: 3.1 g/dL — ABNORMAL LOW (ref 3.4–5.0)
Alkaline Phosphatase: 87 U/L (ref 50–136)
Anion Gap: 6 — ABNORMAL LOW (ref 7–16)
BUN: 14 mg/dL (ref 7–18)
Bilirubin,Total: 0.3 mg/dL (ref 0.2–1.0)
Calcium, Total: 9 mg/dL (ref 8.5–10.1)
Chloride: 102 mmol/L (ref 98–107)
Co2: 33 mmol/L — ABNORMAL HIGH (ref 21–32)
Creatinine: 1.27 mg/dL (ref 0.60–1.30)
EGFR (African American): >60
EGFR (Non-African Amer.): >60
Glucose: 165 mg/dL — ABNORMAL HIGH (ref 65–99)
Osmolality: 285 (ref 275–301)
Potassium: 3.3 mmol/L — ABNORMAL LOW (ref 3.5–5.1)
SGOT(AST): 11 U/L — ABNORMAL LOW (ref 15–37)
SGPT (ALT): 18 U/L (ref 12–78)
Sodium: 141 mmol/L (ref 136–145)
Total Protein: 6.7 g/dL (ref 6.4–8.2)

## 2013-06-11 LAB — CBC CANCER CENTER
Basophil #: 0 x10 3/mm (ref 0.0–0.1)
Basophil %: 0.3 %
Eosinophil #: 0.2 x10 3/mm (ref 0.0–0.7)
Eosinophil %: 1.9 %
HCT: 33.2 % — ABNORMAL LOW (ref 40.0–52.0)
HGB: 11.1 g/dL — ABNORMAL LOW (ref 13.0–18.0)
Lymphocyte #: 3.5 x10 3/mm (ref 1.0–3.6)
Lymphocyte %: 35.8 %
MCH: 28.5 pg (ref 26.0–34.0)
MCHC: 33.5 g/dL (ref 32.0–36.0)
MCV: 85 fL (ref 80–100)
Monocyte #: 0.8 x10 3/mm (ref 0.2–1.0)
Monocyte %: 8.6 %
Neutrophil #: 5.2 x10 3/mm (ref 1.4–6.5)
Neutrophil %: 53.4 %
Platelet: 231 x10 3/mm (ref 150–440)
RBC: 3.91 10*6/uL — ABNORMAL LOW (ref 4.40–5.90)
RDW: 16.2 % — ABNORMAL HIGH (ref 11.5–14.5)
WBC: 9.7 x10 3/mm (ref 3.8–10.6)

## 2013-06-15 ENCOUNTER — Ambulatory Visit: Payer: Self-pay | Admitting: Oncology

## 2013-06-15 ENCOUNTER — Ambulatory Visit: Payer: Self-pay

## 2013-07-15 ENCOUNTER — Ambulatory Visit: Payer: Self-pay | Admitting: Oncology

## 2013-08-15 ENCOUNTER — Ambulatory Visit: Payer: Self-pay | Admitting: Oncology

## 2013-08-17 LAB — CBC CANCER CENTER
Basophil #: 0 x10 3/mm (ref 0.0–0.1)
Basophil %: 0.4 %
Eosinophil %: 0.4 %
HCT: 34.8 % — ABNORMAL LOW (ref 40.0–52.0)
HGB: 11.1 g/dL — ABNORMAL LOW (ref 13.0–18.0)
Lymphocyte #: 3 x10 3/mm (ref 1.0–3.6)
MCH: 26.9 pg (ref 26.0–34.0)
Monocyte #: 0.6 x10 3/mm (ref 0.2–1.0)
Monocyte %: 6 %
Platelet: 242 x10 3/mm (ref 150–440)

## 2013-09-08 LAB — COMPREHENSIVE METABOLIC PANEL
Albumin: 3.5 g/dL (ref 3.4–5.0)
Anion Gap: 9 (ref 7–16)
BUN: 18 mg/dL (ref 7–18)
Bilirubin,Total: 0.2 mg/dL (ref 0.2–1.0)
Calcium, Total: 9.7 mg/dL (ref 8.5–10.1)
Co2: 31 mmol/L (ref 21–32)
EGFR (African American): >60
EGFR (Non-African Amer.): >60
Glucose: 182 mg/dL — ABNORMAL HIGH (ref 65–99)
Osmolality: 280 (ref 275–301)
Potassium: 3.6 mmol/L (ref 3.5–5.1)
SGPT (ALT): 19 U/L (ref 12–78)
Sodium: 137 mmol/L (ref 136–145)

## 2013-09-08 LAB — CBC CANCER CENTER
Basophil #: 0 x10 3/mm (ref 0.0–0.1)
Eosinophil #: 0 x10 3/mm (ref 0.0–0.7)
HCT: 32.1 % — ABNORMAL LOW (ref 40.0–52.0)
Lymphocyte #: 4.6 x10 3/mm — ABNORMAL HIGH (ref 1.0–3.6)
MCH: 27 pg (ref 26.0–34.0)
MCHC: 32 g/dL (ref 32.0–36.0)
MCV: 84 fL (ref 80–100)
Monocyte #: 1 x10 3/mm (ref 0.2–1.0)
Monocyte %: 7.3 %
Neutrophil #: 8.1 x10 3/mm — ABNORMAL HIGH (ref 1.4–6.5)
Neutrophil %: 59.1 %
Platelet: 273 x10 3/mm (ref 150–440)
RBC: 3.8 10*6/uL — ABNORMAL LOW (ref 4.40–5.90)

## 2013-09-14 ENCOUNTER — Ambulatory Visit: Payer: Self-pay | Admitting: Oncology

## 2013-09-24 LAB — CBC CANCER CENTER
Basophil #: 0 x10 3/mm (ref 0.0–0.1)
HCT: 34 % — ABNORMAL LOW (ref 40.0–52.0)
HGB: 10.9 g/dL — ABNORMAL LOW (ref 13.0–18.0)
Lymphocyte %: 30.1 %
MCHC: 32 g/dL (ref 32.0–36.0)
MCV: 85 fL (ref 80–100)
Neutrophil #: 7.9 x10 3/mm — ABNORMAL HIGH (ref 1.4–6.5)
Neutrophil %: 60.1 %
RBC: 4 10*6/uL — ABNORMAL LOW (ref 4.40–5.90)
RDW: 16.8 % — ABNORMAL HIGH (ref 11.5–14.5)
WBC: 13.2 x10 3/mm — ABNORMAL HIGH (ref 3.8–10.6)

## 2013-09-24 LAB — COMPREHENSIVE METABOLIC PANEL
Albumin: 3.1 g/dL — ABNORMAL LOW (ref 3.4–5.0)
Alkaline Phosphatase: 78 U/L
Anion Gap: 7 (ref 7–16)
BUN: 13 mg/dL (ref 7–18)
Bilirubin,Total: 0.5 mg/dL (ref 0.2–1.0)
Calcium, Total: 9 mg/dL (ref 8.5–10.1)
Co2: 31 mmol/L (ref 21–32)
Creatinine: 1.13 mg/dL (ref 0.60–1.30)
EGFR (African American): >60
EGFR (Non-African Amer.): >60
Glucose: 181 mg/dL — ABNORMAL HIGH (ref 65–99)
Osmolality: 277 (ref 275–301)
SGOT(AST): 10 U/L — ABNORMAL LOW (ref 15–37)
SGPT (ALT): 16 U/L (ref 12–78)
Total Protein: 7.2 g/dL (ref 6.4–8.2)

## 2013-10-15 ENCOUNTER — Ambulatory Visit: Payer: Self-pay | Admitting: Oncology

## 2013-10-22 LAB — COMPREHENSIVE METABOLIC PANEL
ALK PHOS: 86 U/L
ALT: 15 U/L (ref 12–78)
Albumin: 3.3 g/dL — ABNORMAL LOW (ref 3.4–5.0)
Anion Gap: 7 (ref 7–16)
BILIRUBIN TOTAL: 0.2 mg/dL (ref 0.2–1.0)
BUN: 19 mg/dL — ABNORMAL HIGH (ref 7–18)
CALCIUM: 9.1 mg/dL (ref 8.5–10.1)
CO2: 32 mmol/L (ref 21–32)
Chloride: 99 mmol/L (ref 98–107)
Creatinine: 1.25 mg/dL (ref 0.60–1.30)
EGFR (African American): >60
Glucose: 240 mg/dL — ABNORMAL HIGH (ref 65–99)
OSMOLALITY: 286 (ref 275–301)
POTASSIUM: 4 mmol/L (ref 3.5–5.1)
SGOT(AST): 10 U/L — ABNORMAL LOW (ref 15–37)
SODIUM: 138 mmol/L (ref 136–145)
TOTAL PROTEIN: 6.7 g/dL (ref 6.4–8.2)

## 2013-10-22 LAB — CBC CANCER CENTER
BASOS PCT: 0.1 %
Basophil #: 0 x10 3/mm (ref 0.0–0.1)
EOS PCT: 0.1 %
Eosinophil #: 0 x10 3/mm (ref 0.0–0.7)
HCT: 30.1 % — ABNORMAL LOW (ref 40.0–52.0)
HGB: 9.3 g/dL — AB (ref 13.0–18.0)
Lymphocyte #: 4.1 x10 3/mm — ABNORMAL HIGH (ref 1.0–3.6)
Lymphocyte %: 26.7 %
MCH: 26.2 pg (ref 26.0–34.0)
MCHC: 31 g/dL — ABNORMAL LOW (ref 32.0–36.0)
MCV: 85 fL (ref 80–100)
MONO ABS: 0.7 x10 3/mm (ref 0.2–1.0)
Monocyte %: 4.2 %
NEUTROS ABS: 10.6 x10 3/mm — AB (ref 1.4–6.5)
Neutrophil %: 68.9 %
PLATELETS: 262 x10 3/mm (ref 150–440)
RBC: 3.56 10*6/uL — ABNORMAL LOW (ref 4.40–5.90)
RDW: 17.4 % — ABNORMAL HIGH (ref 11.5–14.5)
WBC: 15.4 x10 3/mm — AB (ref 3.8–10.6)

## 2013-10-29 LAB — CBC CANCER CENTER
BASOS PCT: 0.2 %
Basophil #: 0 x10 3/mm (ref 0.0–0.1)
EOS ABS: 0 x10 3/mm (ref 0.0–0.7)
Eosinophil %: 0.2 %
HCT: 28.6 % — ABNORMAL LOW (ref 40.0–52.0)
HGB: 9.2 g/dL — ABNORMAL LOW (ref 13.0–18.0)
Lymphocyte #: 4.4 x10 3/mm — ABNORMAL HIGH (ref 1.0–3.6)
Lymphocyte %: 43.2 %
MCH: 26.6 pg (ref 26.0–34.0)
MCHC: 32 g/dL (ref 32.0–36.0)
MCV: 83 fL (ref 80–100)
MONO ABS: 0.4 x10 3/mm (ref 0.2–1.0)
Monocyte %: 3.4 %
Neutrophil #: 5.4 x10 3/mm (ref 1.4–6.5)
Neutrophil %: 53 %
PLATELETS: 245 x10 3/mm (ref 150–440)
RBC: 3.44 10*6/uL — ABNORMAL LOW (ref 4.40–5.90)
RDW: 17.3 % — ABNORMAL HIGH (ref 11.5–14.5)
WBC: 10.3 x10 3/mm (ref 3.8–10.6)

## 2013-10-29 LAB — COMPREHENSIVE METABOLIC PANEL
ALT: 15 U/L (ref 12–78)
ANION GAP: 9 (ref 7–16)
AST: 10 U/L — AB (ref 15–37)
Albumin: 3.2 g/dL — ABNORMAL LOW (ref 3.4–5.0)
Alkaline Phosphatase: 76 U/L
BILIRUBIN TOTAL: 0.3 mg/dL (ref 0.2–1.0)
BUN: 20 mg/dL — ABNORMAL HIGH (ref 7–18)
Calcium, Total: 8.8 mg/dL (ref 8.5–10.1)
Chloride: 98 mmol/L (ref 98–107)
Co2: 30 mmol/L (ref 21–32)
Creatinine: 1.15 mg/dL (ref 0.60–1.30)
EGFR (African American): >60
EGFR (Non-African Amer.): >60
GLUCOSE: 199 mg/dL — AB (ref 65–99)
Osmolality: 282 (ref 275–301)
Potassium: 3.9 mmol/L (ref 3.5–5.1)
Sodium: 137 mmol/L (ref 136–145)
TOTAL PROTEIN: 6.9 g/dL (ref 6.4–8.2)

## 2013-11-05 ENCOUNTER — Ambulatory Visit: Payer: Self-pay | Admitting: Gastroenterology

## 2013-11-10 LAB — PATHOLOGY REPORT

## 2013-11-12 LAB — COMPREHENSIVE METABOLIC PANEL
ALK PHOS: 64 U/L
ALT: 11 U/L — AB (ref 12–78)
AST: 10 U/L — AB (ref 15–37)
Albumin: 2.8 g/dL — ABNORMAL LOW (ref 3.4–5.0)
Anion Gap: 7 (ref 7–16)
BUN: 9 mg/dL (ref 7–18)
Bilirubin,Total: 0.2 mg/dL (ref 0.2–1.0)
CALCIUM: 8 mg/dL — AB (ref 8.5–10.1)
CO2: 32 mmol/L (ref 21–32)
CREATININE: 1.14 mg/dL (ref 0.60–1.30)
Chloride: 100 mmol/L (ref 98–107)
EGFR (African American): >60
Glucose: 257 mg/dL — ABNORMAL HIGH (ref 65–99)
Osmolality: 285 (ref 275–301)
Potassium: 3.3 mmol/L — ABNORMAL LOW (ref 3.5–5.1)
Sodium: 139 mmol/L (ref 136–145)
Total Protein: 6.3 g/dL — ABNORMAL LOW (ref 6.4–8.2)

## 2013-11-12 LAB — CBC CANCER CENTER
BASOS ABS: 0 x10 3/mm (ref 0.0–0.1)
BASOS PCT: 0.2 %
Eosinophil #: 0 x10 3/mm (ref 0.0–0.7)
Eosinophil %: 0.2 %
HCT: 28.4 % — ABNORMAL LOW (ref 40.0–52.0)
HGB: 9.1 g/dL — AB (ref 13.0–18.0)
LYMPHS PCT: 24.4 %
Lymphocyte #: 3.8 x10 3/mm — ABNORMAL HIGH (ref 1.0–3.6)
MCH: 26.8 pg (ref 26.0–34.0)
MCHC: 32.1 g/dL (ref 32.0–36.0)
MCV: 84 fL (ref 80–100)
MONOS PCT: 4.1 %
Monocyte #: 0.6 x10 3/mm (ref 0.2–1.0)
Neutrophil #: 11.1 x10 3/mm — ABNORMAL HIGH (ref 1.4–6.5)
Neutrophil %: 71.1 %
Platelet: 303 x10 3/mm (ref 150–440)
RBC: 3.4 10*6/uL — ABNORMAL LOW (ref 4.40–5.90)
RDW: 17.8 % — ABNORMAL HIGH (ref 11.5–14.5)
WBC: 15.6 x10 3/mm — AB (ref 3.8–10.6)

## 2013-11-15 ENCOUNTER — Ambulatory Visit: Payer: Self-pay | Admitting: Oncology

## 2013-11-30 ENCOUNTER — Ambulatory Visit: Payer: Self-pay | Admitting: Oncology

## 2013-12-03 LAB — CBC CANCER CENTER
Basophil #: 0 x10 3/mm (ref 0.0–0.1)
Basophil %: 0.3 %
Eosinophil #: 0 x10 3/mm (ref 0.0–0.7)
Eosinophil %: 0.4 %
HCT: 30.8 % — ABNORMAL LOW (ref 40.0–52.0)
HGB: 9.6 g/dL — ABNORMAL LOW (ref 13.0–18.0)
Lymphocyte #: 4.4 x10 3/mm — ABNORMAL HIGH (ref 1.0–3.6)
Lymphocyte %: 34.7 %
MCH: 26.4 pg (ref 26.0–34.0)
MCHC: 31.3 g/dL — ABNORMAL LOW (ref 32.0–36.0)
MCV: 85 fL (ref 80–100)
Monocyte #: 1.3 x10 3/mm — ABNORMAL HIGH (ref 0.2–1.0)
Monocyte %: 10.2 %
Neutrophil #: 6.9 x10 3/mm — ABNORMAL HIGH (ref 1.4–6.5)
Neutrophil %: 54.4 %
Platelet: 267 x10 3/mm (ref 150–440)
RBC: 3.64 10*6/uL — ABNORMAL LOW (ref 4.40–5.90)
RDW: 17.8 % — ABNORMAL HIGH (ref 11.5–14.5)
WBC: 12.7 x10 3/mm — ABNORMAL HIGH (ref 3.8–10.6)

## 2013-12-03 LAB — COMPREHENSIVE METABOLIC PANEL
Albumin: 3.1 g/dL — ABNORMAL LOW (ref 3.4–5.0)
Alkaline Phosphatase: 73 U/L
Anion Gap: 12 (ref 7–16)
BUN: 11 mg/dL (ref 7–18)
Bilirubin,Total: 0.4 mg/dL (ref 0.2–1.0)
Calcium, Total: 9.1 mg/dL (ref 8.5–10.1)
Chloride: 101 mmol/L (ref 98–107)
Co2: 27 mmol/L (ref 21–32)
Creatinine: 1.18 mg/dL (ref 0.60–1.30)
EGFR (African American): >60
EGFR (Non-African Amer.): >60
Glucose: 211 mg/dL — ABNORMAL HIGH (ref 65–99)
Osmolality: 285 (ref 275–301)
Potassium: 3.4 mmol/L — ABNORMAL LOW (ref 3.5–5.1)
SGOT(AST): 11 U/L — ABNORMAL LOW (ref 15–37)
SGPT (ALT): 13 U/L (ref 12–78)
Sodium: 140 mmol/L (ref 136–145)
Total Protein: 6.8 g/dL (ref 6.4–8.2)

## 2013-12-13 ENCOUNTER — Ambulatory Visit: Payer: Self-pay | Admitting: Oncology

## 2013-12-31 LAB — CBC CANCER CENTER
Basophil #: 0 x10 3/mm (ref 0.0–0.1)
Basophil %: 0.3 %
EOS ABS: 0 x10 3/mm (ref 0.0–0.7)
EOS PCT: 0.3 %
HCT: 29.8 % — AB (ref 40.0–52.0)
HGB: 9.3 g/dL — AB (ref 13.0–18.0)
LYMPHS ABS: 3.9 x10 3/mm — AB (ref 1.0–3.6)
LYMPHS PCT: 30.4 %
MCH: 26.4 pg (ref 26.0–34.0)
MCHC: 31.2 g/dL — ABNORMAL LOW (ref 32.0–36.0)
MCV: 85 fL (ref 80–100)
MONO ABS: 0.7 x10 3/mm (ref 0.2–1.0)
Monocyte %: 5.4 %
NEUTROS PCT: 63.6 %
Neutrophil #: 8.2 x10 3/mm — ABNORMAL HIGH (ref 1.4–6.5)
PLATELETS: 261 x10 3/mm (ref 150–440)
RBC: 3.53 10*6/uL — ABNORMAL LOW (ref 4.40–5.90)
RDW: 17.8 % — AB (ref 11.5–14.5)
WBC: 12.9 x10 3/mm — ABNORMAL HIGH (ref 3.8–10.6)

## 2013-12-31 LAB — COMPREHENSIVE METABOLIC PANEL
ALT: 10 U/L — AB (ref 12–78)
AST: 7 U/L — AB (ref 15–37)
Albumin: 2.9 g/dL — ABNORMAL LOW (ref 3.4–5.0)
Alkaline Phosphatase: 86 U/L
Anion Gap: 9 (ref 7–16)
BUN: 20 mg/dL — ABNORMAL HIGH (ref 7–18)
Bilirubin,Total: 0.2 mg/dL (ref 0.2–1.0)
CHLORIDE: 98 mmol/L (ref 98–107)
Calcium, Total: 8.7 mg/dL (ref 8.5–10.1)
Co2: 30 mmol/L (ref 21–32)
Creatinine: 1.25 mg/dL (ref 0.60–1.30)
EGFR (Non-African Amer.): >60
Glucose: 313 mg/dL — ABNORMAL HIGH (ref 65–99)
Osmolality: 288 (ref 275–301)
Potassium: 3.5 mmol/L (ref 3.5–5.1)
Sodium: 137 mmol/L (ref 136–145)
TOTAL PROTEIN: 6.7 g/dL (ref 6.4–8.2)

## 2014-01-13 ENCOUNTER — Ambulatory Visit: Payer: Self-pay | Admitting: Oncology

## 2014-01-28 LAB — COMPREHENSIVE METABOLIC PANEL
ALT: 9 U/L — AB (ref 12–78)
AST: 6 U/L — AB (ref 15–37)
Albumin: 3 g/dL — ABNORMAL LOW (ref 3.4–5.0)
Alkaline Phosphatase: 102 U/L
Anion Gap: 8 (ref 7–16)
BUN: 14 mg/dL (ref 7–18)
Bilirubin,Total: 0.5 mg/dL (ref 0.2–1.0)
Calcium, Total: 9.4 mg/dL (ref 8.5–10.1)
Chloride: 98 mmol/L (ref 98–107)
Co2: 33 mmol/L — ABNORMAL HIGH (ref 21–32)
Creatinine: 1.06 mg/dL (ref 0.60–1.30)
Glucose: 175 mg/dL — ABNORMAL HIGH (ref 65–99)
Osmolality: 282 (ref 275–301)
Potassium: 4 mmol/L (ref 3.5–5.1)
Sodium: 139 mmol/L (ref 136–145)
TOTAL PROTEIN: 7.1 g/dL (ref 6.4–8.2)

## 2014-01-28 LAB — CBC CANCER CENTER
BASOS PCT: 1.1 %
Basophil #: 0.1 x10 3/mm (ref 0.0–0.1)
Eosinophil #: 0 x10 3/mm (ref 0.0–0.7)
Eosinophil %: 0.4 %
HCT: 30.5 % — ABNORMAL LOW (ref 40.0–52.0)
HGB: 9.5 g/dL — AB (ref 13.0–18.0)
LYMPHS PCT: 24.4 %
Lymphocyte #: 2.8 x10 3/mm (ref 1.0–3.6)
MCH: 26.1 pg (ref 26.0–34.0)
MCHC: 31.2 g/dL — ABNORMAL LOW (ref 32.0–36.0)
MCV: 84 fL (ref 80–100)
Monocyte #: 1.2 x10 3/mm — ABNORMAL HIGH (ref 0.2–1.0)
Monocyte %: 10.5 %
NEUTROS PCT: 63.6 %
Neutrophil #: 7.4 x10 3/mm — ABNORMAL HIGH (ref 1.4–6.5)
Platelet: 298 x10 3/mm (ref 150–440)
RBC: 3.65 10*6/uL — ABNORMAL LOW (ref 4.40–5.90)
RDW: 17.1 % — AB (ref 11.5–14.5)
WBC: 11.6 x10 3/mm — AB (ref 3.8–10.6)

## 2014-02-12 ENCOUNTER — Ambulatory Visit: Payer: Self-pay | Admitting: Oncology

## 2014-03-25 ENCOUNTER — Ambulatory Visit: Payer: Self-pay | Admitting: Oncology

## 2014-03-25 LAB — CBC CANCER CENTER
Basophil #: 0 x10 3/mm (ref 0.0–0.1)
Basophil %: 0.2 %
EOS ABS: 0 x10 3/mm (ref 0.0–0.7)
Eosinophil %: 0.3 %
HCT: 29.2 % — ABNORMAL LOW (ref 40.0–52.0)
HGB: 9.1 g/dL — ABNORMAL LOW (ref 13.0–18.0)
LYMPHS ABS: 3.4 x10 3/mm (ref 1.0–3.6)
Lymphocyte %: 28.6 %
MCH: 25.5 pg — ABNORMAL LOW (ref 26.0–34.0)
MCHC: 31.3 g/dL — AB (ref 32.0–36.0)
MCV: 82 fL (ref 80–100)
MONO ABS: 0.4 x10 3/mm (ref 0.2–1.0)
Monocyte %: 3.3 %
Neutrophil #: 8.1 x10 3/mm — ABNORMAL HIGH (ref 1.4–6.5)
Neutrophil %: 67.6 %
PLATELETS: 296 x10 3/mm (ref 150–440)
RBC: 3.58 10*6/uL — AB (ref 4.40–5.90)
RDW: 18.3 % — ABNORMAL HIGH (ref 11.5–14.5)
WBC: 12 x10 3/mm — ABNORMAL HIGH (ref 3.8–10.6)

## 2014-03-25 LAB — COMPREHENSIVE METABOLIC PANEL
ALK PHOS: 118 U/L — AB
ANION GAP: 10 (ref 7–16)
Albumin: 3 g/dL — ABNORMAL LOW (ref 3.4–5.0)
BILIRUBIN TOTAL: 0.3 mg/dL (ref 0.2–1.0)
BUN: 23 mg/dL — ABNORMAL HIGH (ref 7–18)
CO2: 27 mmol/L (ref 21–32)
CREATININE: 1.26 mg/dL (ref 0.60–1.30)
Calcium, Total: 9.3 mg/dL (ref 8.5–10.1)
Chloride: 99 mmol/L (ref 98–107)
EGFR (African American): >60
GLUCOSE: 301 mg/dL — AB (ref 65–99)
Osmolality: 287 (ref 275–301)
Potassium: 3.9 mmol/L (ref 3.5–5.1)
SGOT(AST): 7 U/L — ABNORMAL LOW (ref 15–37)
SGPT (ALT): 14 U/L (ref 12–78)
SODIUM: 136 mmol/L (ref 136–145)
Total Protein: 7.1 g/dL (ref 6.4–8.2)

## 2014-04-14 ENCOUNTER — Ambulatory Visit: Payer: Self-pay | Admitting: Oncology

## 2014-05-06 LAB — CBC CANCER CENTER
BASOS ABS: 0 x10 3/mm (ref 0.0–0.1)
Basophil %: 0.4 %
EOS ABS: 0.1 x10 3/mm (ref 0.0–0.7)
Eosinophil %: 1.1 %
HCT: 29 % — ABNORMAL LOW (ref 40.0–52.0)
HGB: 9 g/dL — ABNORMAL LOW (ref 13.0–18.0)
LYMPHS ABS: 3.6 x10 3/mm (ref 1.0–3.6)
LYMPHS PCT: 31.6 %
MCH: 25 pg — ABNORMAL LOW (ref 26.0–34.0)
MCHC: 31 g/dL — ABNORMAL LOW (ref 32.0–36.0)
MCV: 81 fL (ref 80–100)
MONOS PCT: 6 %
Monocyte #: 0.7 x10 3/mm (ref 0.2–1.0)
NEUTROS PCT: 60.9 %
Neutrophil #: 6.9 x10 3/mm — ABNORMAL HIGH (ref 1.4–6.5)
Platelet: 313 x10 3/mm (ref 150–440)
RBC: 3.59 10*6/uL — ABNORMAL LOW (ref 4.40–5.90)
RDW: 18.7 % — ABNORMAL HIGH (ref 11.5–14.5)
WBC: 11.3 x10 3/mm — AB (ref 3.8–10.6)

## 2014-05-06 LAB — COMPREHENSIVE METABOLIC PANEL
AST: 7 U/L — AB (ref 15–37)
Albumin: 2.9 g/dL — ABNORMAL LOW (ref 3.4–5.0)
Alkaline Phosphatase: 98 U/L
Anion Gap: 5 — ABNORMAL LOW (ref 7–16)
BUN: 17 mg/dL (ref 7–18)
Bilirubin,Total: 0.2 mg/dL (ref 0.2–1.0)
CREATININE: 0.96 mg/dL (ref 0.60–1.30)
Calcium, Total: 9.2 mg/dL (ref 8.5–10.1)
Chloride: 102 mmol/L (ref 98–107)
Co2: 34 mmol/L — ABNORMAL HIGH (ref 21–32)
EGFR (African American): >60
Glucose: 192 mg/dL — ABNORMAL HIGH (ref 65–99)
Osmolality: 288 (ref 275–301)
POTASSIUM: 3.6 mmol/L (ref 3.5–5.1)
SGPT (ALT): 15 U/L
Sodium: 141 mmol/L (ref 136–145)
TOTAL PROTEIN: 7.2 g/dL (ref 6.4–8.2)

## 2014-05-15 ENCOUNTER — Ambulatory Visit: Payer: Self-pay | Admitting: Oncology

## 2014-06-03 LAB — CBC CANCER CENTER
Basophil #: 0 x10 3/mm (ref 0.0–0.1)
Basophil %: 0.2 %
EOS ABS: 0.1 x10 3/mm (ref 0.0–0.7)
EOS PCT: 1 %
HCT: 29 % — ABNORMAL LOW (ref 40.0–52.0)
HGB: 9.1 g/dL — ABNORMAL LOW (ref 13.0–18.0)
LYMPHS PCT: 31 %
Lymphocyte #: 3.9 x10 3/mm — ABNORMAL HIGH (ref 1.0–3.6)
MCH: 25.4 pg — AB (ref 26.0–34.0)
MCHC: 31.5 g/dL — AB (ref 32.0–36.0)
MCV: 81 fL (ref 80–100)
Monocyte #: 0.8 x10 3/mm (ref 0.2–1.0)
Monocyte %: 6.1 %
Neutrophil #: 7.7 x10 3/mm — ABNORMAL HIGH (ref 1.4–6.5)
Neutrophil %: 61.7 %
PLATELETS: 341 x10 3/mm (ref 150–440)
RBC: 3.6 10*6/uL — ABNORMAL LOW (ref 4.40–5.90)
RDW: 18.5 % — ABNORMAL HIGH (ref 11.5–14.5)
WBC: 12.5 x10 3/mm — ABNORMAL HIGH (ref 3.8–10.6)

## 2014-06-03 LAB — COMPREHENSIVE METABOLIC PANEL
Albumin: 2.6 g/dL — ABNORMAL LOW (ref 3.4–5.0)
Alkaline Phosphatase: 81 U/L
Anion Gap: 8 (ref 7–16)
BUN: 18 mg/dL (ref 7–18)
Bilirubin,Total: 0.2 mg/dL (ref 0.2–1.0)
CHLORIDE: 99 mmol/L (ref 98–107)
Calcium, Total: 9 mg/dL (ref 8.5–10.1)
Co2: 32 mmol/L (ref 21–32)
Creatinine: 1.2 mg/dL (ref 0.60–1.30)
EGFR (African American): >60
EGFR (Non-African Amer.): >60
Glucose: 175 mg/dL — ABNORMAL HIGH (ref 65–99)
Osmolality: 284 (ref 275–301)
Potassium: 3.7 mmol/L (ref 3.5–5.1)
SGOT(AST): 12 U/L — ABNORMAL LOW (ref 15–37)
SGPT (ALT): 18 U/L
Sodium: 139 mmol/L (ref 136–145)
TOTAL PROTEIN: 7.1 g/dL (ref 6.4–8.2)

## 2014-06-15 ENCOUNTER — Ambulatory Visit: Payer: Self-pay | Admitting: Oncology

## 2014-07-15 ENCOUNTER — Ambulatory Visit: Payer: Self-pay | Admitting: Oncology

## 2014-08-03 ENCOUNTER — Ambulatory Visit: Payer: Self-pay | Admitting: Oncology

## 2014-08-03 LAB — CBC WITH DIFFERENTIAL/PLATELET
BASOS PCT: 0.3 %
Basophil #: 0 10*3/uL (ref 0.0–0.1)
Eosinophil #: 0.1 10*3/uL (ref 0.0–0.7)
Eosinophil %: 1 %
HCT: 28.1 % — AB (ref 40.0–52.0)
HGB: 9 g/dL — AB (ref 13.0–18.0)
LYMPHS ABS: 3.8 10*3/uL — AB (ref 1.0–3.6)
Lymphocyte %: 36.8 %
MCH: 26.5 pg (ref 26.0–34.0)
MCHC: 32 g/dL (ref 32.0–36.0)
MCV: 83 fL (ref 80–100)
Monocyte #: 0.8 x10 3/mm (ref 0.2–1.0)
Monocyte %: 7.6 %
NEUTROS PCT: 54.3 %
Neutrophil #: 5.6 10*3/uL (ref 1.4–6.5)
Platelet: 246 10*3/uL (ref 150–440)
RBC: 3.39 10*6/uL — AB (ref 4.40–5.90)
RDW: 18.2 % — ABNORMAL HIGH (ref 11.5–14.5)
WBC: 10.3 10*3/uL (ref 3.8–10.6)

## 2014-08-03 LAB — BASIC METABOLIC PANEL
Anion Gap: 9 (ref 7–16)
BUN: 20 mg/dL — ABNORMAL HIGH (ref 7–18)
CO2: 31 mmol/L (ref 21–32)
CREATININE: 0.96 mg/dL (ref 0.60–1.30)
Calcium, Total: 8.4 mg/dL — ABNORMAL LOW (ref 8.5–10.1)
Chloride: 104 mmol/L (ref 98–107)
EGFR (Non-African Amer.): >60
Glucose: 167 mg/dL — ABNORMAL HIGH (ref 65–99)
Osmolality: 293 (ref 275–301)
Potassium: 3.5 mmol/L (ref 3.5–5.1)
Sodium: 144 mmol/L (ref 136–145)

## 2014-08-03 LAB — PROTIME-INR
INR: 0.9
Prothrombin Time: 12.4 secs (ref 11.5–14.7)

## 2014-08-03 LAB — PATHOLOGY REPORT

## 2014-08-09 LAB — COMPREHENSIVE METABOLIC PANEL
ALT: 15 U/L
Albumin: 2.8 g/dL — ABNORMAL LOW (ref 3.4–5.0)
Alkaline Phosphatase: 86 U/L
Anion Gap: 7 (ref 7–16)
BUN: 21 mg/dL — AB (ref 7–18)
Bilirubin,Total: 0.2 mg/dL (ref 0.2–1.0)
CALCIUM: 8.8 mg/dL (ref 8.5–10.1)
CHLORIDE: 101 mmol/L (ref 98–107)
CREATININE: 1.17 mg/dL (ref 0.60–1.30)
Co2: 32 mmol/L (ref 21–32)
Glucose: 330 mg/dL — ABNORMAL HIGH (ref 65–99)
OSMOLALITY: 295 (ref 275–301)
POTASSIUM: 3.6 mmol/L (ref 3.5–5.1)
SGOT(AST): 9 U/L — ABNORMAL LOW (ref 15–37)
Sodium: 140 mmol/L (ref 136–145)
TOTAL PROTEIN: 6.8 g/dL (ref 6.4–8.2)

## 2014-08-09 LAB — CBC CANCER CENTER
Basophil #: 0 x10 3/mm (ref 0.0–0.1)
Basophil %: 0.2 %
EOS ABS: 0 x10 3/mm (ref 0.0–0.7)
EOS PCT: 0.2 %
HCT: 29.1 % — ABNORMAL LOW (ref 40.0–52.0)
HGB: 9 g/dL — AB (ref 13.0–18.0)
Lymphocyte #: 3.3 x10 3/mm (ref 1.0–3.6)
Lymphocyte %: 24.3 %
MCH: 25.7 pg — AB (ref 26.0–34.0)
MCHC: 31 g/dL — ABNORMAL LOW (ref 32.0–36.0)
MCV: 83 fL (ref 80–100)
Monocyte #: 0.7 x10 3/mm (ref 0.2–1.0)
Monocyte %: 5.3 %
NEUTROS ABS: 9.6 x10 3/mm — AB (ref 1.4–6.5)
Neutrophil %: 70 %
Platelet: 294 x10 3/mm (ref 150–440)
RBC: 3.5 10*6/uL — AB (ref 4.40–5.90)
RDW: 18.7 % — ABNORMAL HIGH (ref 11.5–14.5)
WBC: 13.7 x10 3/mm — AB (ref 3.8–10.6)

## 2014-08-15 ENCOUNTER — Ambulatory Visit: Payer: Self-pay | Admitting: Oncology

## 2014-08-23 LAB — COMPREHENSIVE METABOLIC PANEL
ALK PHOS: 87 U/L
Albumin: 2.9 g/dL — ABNORMAL LOW (ref 3.4–5.0)
Anion Gap: 8 (ref 7–16)
BUN: 23 mg/dL — AB (ref 7–18)
Bilirubin,Total: 0.2 mg/dL (ref 0.2–1.0)
Calcium, Total: 9 mg/dL (ref 8.5–10.1)
Chloride: 101 mmol/L (ref 98–107)
Co2: 32 mmol/L (ref 21–32)
Creatinine: 1.27 mg/dL (ref 0.60–1.30)
EGFR (African American): >60
EGFR (Non-African Amer.): >60
Glucose: 260 mg/dL — ABNORMAL HIGH (ref 65–99)
OSMOLALITY: 294 (ref 275–301)
Potassium: 3.5 mmol/L (ref 3.5–5.1)
SGOT(AST): 9 U/L — ABNORMAL LOW (ref 15–37)
SGPT (ALT): 17 U/L
Sodium: 141 mmol/L (ref 136–145)
TOTAL PROTEIN: 7 g/dL (ref 6.4–8.2)

## 2014-08-23 LAB — CBC CANCER CENTER
Basophil #: 0 x10 3/mm (ref 0.0–0.1)
Basophil %: 0.1 %
EOS ABS: 0.1 x10 3/mm (ref 0.0–0.7)
Eosinophil %: 0.7 %
HCT: 30.8 % — AB (ref 40.0–52.0)
HGB: 9.7 g/dL — ABNORMAL LOW (ref 13.0–18.0)
LYMPHS ABS: 2.6 x10 3/mm (ref 1.0–3.6)
Lymphocyte %: 19.7 %
MCH: 26.3 pg (ref 26.0–34.0)
MCHC: 31.5 g/dL — ABNORMAL LOW (ref 32.0–36.0)
MCV: 84 fL (ref 80–100)
Monocyte #: 0.8 x10 3/mm (ref 0.2–1.0)
Monocyte %: 6.3 %
Neutrophil #: 9.5 x10 3/mm — ABNORMAL HIGH (ref 1.4–6.5)
Neutrophil %: 73.2 %
PLATELETS: 263 x10 3/mm (ref 150–440)
RBC: 3.68 10*6/uL — AB (ref 4.40–5.90)
RDW: 18.6 % — ABNORMAL HIGH (ref 11.5–14.5)
WBC: 12.9 x10 3/mm — ABNORMAL HIGH (ref 3.8–10.6)

## 2014-08-26 ENCOUNTER — Inpatient Hospital Stay: Payer: Self-pay | Admitting: Internal Medicine

## 2014-08-26 LAB — URINALYSIS, COMPLETE
BILIRUBIN, UR: NEGATIVE
Bacteria: NONE SEEN
Glucose,UR: NEGATIVE mg/dL (ref 0–75)
Ketone: NEGATIVE
Leukocyte Esterase: NEGATIVE
Nitrite: NEGATIVE
PROTEIN: NEGATIVE
Ph: 5 (ref 4.5–8.0)
RBC,UR: 5 /HPF (ref 0–5)
Specific Gravity: 1.013 (ref 1.003–1.030)

## 2014-08-26 LAB — CBC
HCT: 31.2 % — ABNORMAL LOW (ref 40.0–52.0)
HGB: 9.9 g/dL — AB (ref 13.0–18.0)
MCH: 26.6 pg (ref 26.0–34.0)
MCHC: 31.7 g/dL — AB (ref 32.0–36.0)
MCV: 84 fL (ref 80–100)
PLATELETS: 216 10*3/uL (ref 150–440)
RBC: 3.71 10*6/uL — ABNORMAL LOW (ref 4.40–5.90)
RDW: 17.9 % — AB (ref 11.5–14.5)
WBC: 14.7 10*3/uL — ABNORMAL HIGH (ref 3.8–10.6)

## 2014-08-26 LAB — BASIC METABOLIC PANEL
ANION GAP: 9 (ref 7–16)
BUN: 14 mg/dL (ref 7–18)
CO2: 29 mmol/L (ref 21–32)
CREATININE: 0.97 mg/dL (ref 0.60–1.30)
Calcium, Total: 8.5 mg/dL (ref 8.5–10.1)
Chloride: 101 mmol/L (ref 98–107)
GLUCOSE: 239 mg/dL — AB (ref 65–99)
Osmolality: 286 (ref 275–301)
Potassium: 3.4 mmol/L — ABNORMAL LOW (ref 3.5–5.1)
Sodium: 139 mmol/L (ref 136–145)

## 2014-08-26 LAB — TROPONIN I: Troponin-I: <0.02 ng/mL

## 2014-08-27 LAB — CBC WITH DIFFERENTIAL/PLATELET
Basophil #: 0 10*3/uL (ref 0.0–0.1)
Basophil %: 0.1 %
EOS ABS: 0 10*3/uL (ref 0.0–0.7)
EOS PCT: 0 %
HCT: 25.5 % — ABNORMAL LOW (ref 40.0–52.0)
HGB: 7.9 g/dL — ABNORMAL LOW (ref 13.0–18.0)
Lymphocyte #: 2 10*3/uL (ref 1.0–3.6)
Lymphocyte %: 16.2 %
MCH: 26.2 pg (ref 26.0–34.0)
MCHC: 31.2 g/dL — AB (ref 32.0–36.0)
MCV: 84 fL (ref 80–100)
Monocyte #: 0.3 x10 3/mm (ref 0.2–1.0)
Monocyte %: 2.7 %
NEUTROS PCT: 81 %
Neutrophil #: 9.7 10*3/uL — ABNORMAL HIGH (ref 1.4–6.5)
Platelet: 188 10*3/uL (ref 150–440)
RBC: 3.03 10*6/uL — ABNORMAL LOW (ref 4.40–5.90)
RDW: 18.2 % — ABNORMAL HIGH (ref 11.5–14.5)
WBC: 12 10*3/uL — ABNORMAL HIGH (ref 3.8–10.6)

## 2014-08-27 LAB — BASIC METABOLIC PANEL
Anion Gap: 7 (ref 7–16)
BUN: 22 mg/dL — ABNORMAL HIGH (ref 7–18)
CALCIUM: 7.8 mg/dL — AB (ref 8.5–10.1)
CREATININE: 1.29 mg/dL (ref 0.60–1.30)
Chloride: 101 mmol/L (ref 98–107)
Co2: 28 mmol/L (ref 21–32)
EGFR (African American): >60
EGFR (Non-African Amer.): 60 — ABNORMAL LOW
Glucose: 278 mg/dL — ABNORMAL HIGH (ref 65–99)
OSMOLALITY: 285 (ref 275–301)
Potassium: 4.4 mmol/L (ref 3.5–5.1)
Sodium: 136 mmol/L (ref 136–145)

## 2014-08-27 LAB — MAGNESIUM: Magnesium: 1 mg/dL — ABNORMAL LOW

## 2014-08-27 LAB — HEMOGLOBIN A1C: Hemoglobin A1C: 9.8 % — ABNORMAL HIGH (ref 4.2–6.3)

## 2014-08-28 LAB — CBC WITH DIFFERENTIAL/PLATELET
Basophil #: 0 10*3/uL (ref 0.0–0.1)
Basophil %: 0 %
Eosinophil #: 0 10*3/uL (ref 0.0–0.7)
Eosinophil %: 0 %
HCT: 25.4 % — ABNORMAL LOW (ref 40.0–52.0)
HGB: 8.2 g/dL — ABNORMAL LOW (ref 13.0–18.0)
LYMPHS ABS: 2.9 10*3/uL (ref 1.0–3.6)
Lymphocyte %: 19.5 %
MCH: 26.6 pg (ref 26.0–34.0)
MCHC: 32.1 g/dL (ref 32.0–36.0)
MCV: 83 fL (ref 80–100)
MONO ABS: 0.3 x10 3/mm (ref 0.2–1.0)
MONOS PCT: 1.8 %
Neutrophil #: 11.5 10*3/uL — ABNORMAL HIGH (ref 1.4–6.5)
Neutrophil %: 78.7 %
Platelet: 190 10*3/uL (ref 150–440)
RBC: 3.07 10*6/uL — AB (ref 4.40–5.90)
RDW: 18.3 % — ABNORMAL HIGH (ref 11.5–14.5)
WBC: 14.7 10*3/uL — AB (ref 3.8–10.6)

## 2014-08-28 LAB — BASIC METABOLIC PANEL
Anion Gap: 8 (ref 7–16)
BUN: 28 mg/dL — ABNORMAL HIGH (ref 7–18)
Calcium, Total: 8 mg/dL — ABNORMAL LOW (ref 8.5–10.1)
Chloride: 101 mmol/L (ref 98–107)
Co2: 29 mmol/L (ref 21–32)
Creatinine: 1.37 mg/dL — ABNORMAL HIGH (ref 0.60–1.30)
GFR CALC NON AF AMER: 56 — AB
Glucose: 173 mg/dL — ABNORMAL HIGH (ref 65–99)
OSMOLALITY: 285 (ref 275–301)
POTASSIUM: 4.1 mmol/L (ref 3.5–5.1)
SODIUM: 138 mmol/L (ref 136–145)

## 2014-08-28 LAB — MAGNESIUM: Magnesium: 1.8 mg/dL

## 2014-08-31 LAB — CULTURE, BLOOD (SINGLE)

## 2014-09-13 LAB — CBC CANCER CENTER
BASOS PCT: 0.9 %
Basophil #: 0.1 x10 3/mm (ref 0.0–0.1)
EOS ABS: 0.1 x10 3/mm (ref 0.0–0.7)
EOS PCT: 0.7 %
HCT: 27.3 % — AB (ref 40.0–52.0)
HGB: 8.7 g/dL — ABNORMAL LOW (ref 13.0–18.0)
LYMPHS ABS: 3.4 x10 3/mm (ref 1.0–3.6)
Lymphocyte %: 26.8 %
MCH: 26.7 pg (ref 26.0–34.0)
MCHC: 32 g/dL (ref 32.0–36.0)
MCV: 83 fL (ref 80–100)
MONO ABS: 1.1 x10 3/mm — AB (ref 0.2–1.0)
Monocyte %: 8.6 %
Neutrophil #: 7.9 x10 3/mm — ABNORMAL HIGH (ref 1.4–6.5)
Neutrophil %: 63 %
Platelet: 275 x10 3/mm (ref 150–440)
RBC: 3.28 10*6/uL — AB (ref 4.40–5.90)
RDW: 17 % — ABNORMAL HIGH (ref 11.5–14.5)
WBC: 12.6 x10 3/mm — ABNORMAL HIGH (ref 3.8–10.6)

## 2014-09-13 LAB — COMPREHENSIVE METABOLIC PANEL
ALBUMIN: 2.6 g/dL — AB (ref 3.4–5.0)
ALT: 13 U/L — AB
ANION GAP: 7 (ref 7–16)
Alkaline Phosphatase: 68 U/L
BILIRUBIN TOTAL: 0.3 mg/dL (ref 0.2–1.0)
BUN: 19 mg/dL — AB (ref 7–18)
CALCIUM: 8.6 mg/dL (ref 8.5–10.1)
Chloride: 98 mmol/L (ref 98–107)
Co2: 32 mmol/L (ref 21–32)
Creatinine: 1.1 mg/dL (ref 0.60–1.30)
EGFR (African American): >60
EGFR (Non-African Amer.): >60
Glucose: 180 mg/dL — ABNORMAL HIGH (ref 65–99)
Osmolality: 281 (ref 275–301)
POTASSIUM: 3.4 mmol/L — AB (ref 3.5–5.1)
SGOT(AST): 9 U/L — ABNORMAL LOW (ref 15–37)
SODIUM: 137 mmol/L (ref 136–145)
Total Protein: 6.7 g/dL (ref 6.4–8.2)

## 2014-09-14 ENCOUNTER — Ambulatory Visit: Payer: Self-pay | Admitting: Oncology

## 2014-09-20 LAB — COMPREHENSIVE METABOLIC PANEL
ALT: 14 U/L
ANION GAP: 7 (ref 7–16)
Albumin: 2.6 g/dL — ABNORMAL LOW (ref 3.4–5.0)
Alkaline Phosphatase: 88 U/L
BILIRUBIN TOTAL: 0.2 mg/dL (ref 0.2–1.0)
BUN: 19 mg/dL — AB (ref 7–18)
CO2: 32 mmol/L (ref 21–32)
Calcium, Total: 8.6 mg/dL (ref 8.5–10.1)
Chloride: 99 mmol/L (ref 98–107)
Creatinine: 1.26 mg/dL (ref 0.60–1.30)
EGFR (African American): >60
EGFR (Non-African Amer.): >60
Glucose: 251 mg/dL — ABNORMAL HIGH (ref 65–99)
Osmolality: 286 (ref 275–301)
Potassium: 3.8 mmol/L (ref 3.5–5.1)
SGOT(AST): 10 U/L — ABNORMAL LOW (ref 15–37)
Sodium: 138 mmol/L (ref 136–145)
Total Protein: 6.6 g/dL (ref 6.4–8.2)

## 2014-09-20 LAB — CBC CANCER CENTER
BASOS ABS: 0.1 x10 3/mm (ref 0.0–0.1)
Basophil %: 0.5 %
Eosinophil #: 0 x10 3/mm (ref 0.0–0.7)
Eosinophil %: 0.3 %
HCT: 29.7 % — ABNORMAL LOW (ref 40.0–52.0)
HGB: 9.6 g/dL — ABNORMAL LOW (ref 13.0–18.0)
Lymphocyte #: 3.3 x10 3/mm (ref 1.0–3.6)
Lymphocyte %: 27.4 %
MCH: 26.2 pg (ref 26.0–34.0)
MCHC: 32.3 g/dL (ref 32.0–36.0)
MCV: 81 fL (ref 80–100)
MONO ABS: 0.8 x10 3/mm (ref 0.2–1.0)
Monocyte %: 6.7 %
NEUTROS ABS: 7.7 x10 3/mm — AB (ref 1.4–6.5)
NEUTROS PCT: 65.1 %
Platelet: 404 x10 3/mm (ref 150–440)
RBC: 3.67 10*6/uL — AB (ref 4.40–5.90)
RDW: 17.6 % — AB (ref 11.5–14.5)
WBC: 11.9 x10 3/mm — ABNORMAL HIGH (ref 3.8–10.6)

## 2014-10-04 LAB — BASIC METABOLIC PANEL
Anion Gap: 11 (ref 7–16)
BUN: 19 mg/dL — ABNORMAL HIGH (ref 7–18)
CALCIUM: 8.6 mg/dL (ref 8.5–10.1)
CHLORIDE: 99 mmol/L (ref 98–107)
Co2: 31 mmol/L (ref 21–32)
Creatinine: 1.24 mg/dL (ref 0.60–1.30)
GLUCOSE: 226 mg/dL — AB (ref 65–99)
OSMOLALITY: 291 (ref 275–301)
Potassium: 3.9 mmol/L (ref 3.5–5.1)
Sodium: 141 mmol/L (ref 136–145)

## 2014-10-04 LAB — CBC CANCER CENTER
Basophil #: 0 x10 3/mm (ref 0.0–0.1)
Basophil %: 0.3 %
Eosinophil #: 0 x10 3/mm (ref 0.0–0.7)
Eosinophil %: 0.2 %
HCT: 30.1 % — ABNORMAL LOW (ref 40.0–52.0)
HGB: 9.6 g/dL — AB (ref 13.0–18.0)
Lymphocyte #: 3.2 x10 3/mm (ref 1.0–3.6)
Lymphocyte %: 29.8 %
MCH: 25.8 pg — ABNORMAL LOW (ref 26.0–34.0)
MCHC: 31.8 g/dL — AB (ref 32.0–36.0)
MCV: 81 fL (ref 80–100)
MONOS PCT: 6.6 %
Monocyte #: 0.7 x10 3/mm (ref 0.2–1.0)
Neutrophil #: 6.7 x10 3/mm — ABNORMAL HIGH (ref 1.4–6.5)
Neutrophil %: 63.1 %
PLATELETS: 312 x10 3/mm (ref 150–440)
RBC: 3.71 10*6/uL — ABNORMAL LOW (ref 4.40–5.90)
RDW: 17.2 % — AB (ref 11.5–14.5)
WBC: 10.6 x10 3/mm (ref 3.8–10.6)

## 2014-10-15 ENCOUNTER — Ambulatory Visit: Payer: Self-pay | Admitting: Oncology

## 2014-10-18 LAB — CBC CANCER CENTER
BASOS ABS: 0 x10 3/mm (ref 0.0–0.1)
Basophil %: 0.2 %
Eosinophil #: 0.1 x10 3/mm (ref 0.0–0.7)
Eosinophil %: 0.8 %
HCT: 32.5 % — ABNORMAL LOW (ref 40.0–52.0)
HGB: 10.2 g/dL — AB (ref 13.0–18.0)
LYMPHS ABS: 3.1 x10 3/mm (ref 1.0–3.6)
LYMPHS PCT: 25 %
MCH: 25.1 pg — ABNORMAL LOW (ref 26.0–34.0)
MCHC: 31.4 g/dL — AB (ref 32.0–36.0)
MCV: 80 fL (ref 80–100)
Monocyte #: 0.9 x10 3/mm (ref 0.2–1.0)
Monocyte %: 7.2 %
NEUTROS PCT: 66.8 %
Neutrophil #: 8.4 x10 3/mm — ABNORMAL HIGH (ref 1.4–6.5)
Platelet: 340 x10 3/mm (ref 150–440)
RBC: 4.07 10*6/uL — ABNORMAL LOW (ref 4.40–5.90)
RDW: 17.2 % — AB (ref 11.5–14.5)
WBC: 12.5 x10 3/mm — ABNORMAL HIGH (ref 3.8–10.6)

## 2014-10-18 LAB — COMPREHENSIVE METABOLIC PANEL
ANION GAP: 9 (ref 7–16)
Albumin: 2.7 g/dL — ABNORMAL LOW (ref 3.4–5.0)
Alkaline Phosphatase: 85 U/L
BUN: 29 mg/dL — ABNORMAL HIGH (ref 7–18)
Bilirubin,Total: 0.3 mg/dL (ref 0.2–1.0)
CALCIUM: 8.8 mg/dL (ref 8.5–10.1)
CHLORIDE: 95 mmol/L — AB (ref 98–107)
CO2: 32 mmol/L (ref 21–32)
CREATININE: 1.34 mg/dL — AB (ref 0.60–1.30)
EGFR (African American): >60
GFR CALC NON AF AMER: 57 — AB
Glucose: 261 mg/dL — ABNORMAL HIGH (ref 65–99)
OSMOLALITY: 287 (ref 275–301)
POTASSIUM: 3.8 mmol/L (ref 3.5–5.1)
SGOT(AST): 11 U/L — ABNORMAL LOW (ref 15–37)
SGPT (ALT): 16 U/L
Sodium: 136 mmol/L (ref 136–145)
Total Protein: 7.2 g/dL (ref 6.4–8.2)

## 2014-11-15 ENCOUNTER — Ambulatory Visit: Payer: Self-pay | Admitting: Oncology

## 2014-11-15 LAB — COMPREHENSIVE METABOLIC PANEL
ALK PHOS: 89 U/L (ref 46–116)
ANION GAP: 7 (ref 7–16)
Albumin: 3.1 g/dL — ABNORMAL LOW (ref 3.4–5.0)
BUN: 24 mg/dL — ABNORMAL HIGH (ref 7–18)
Bilirubin,Total: 0.3 mg/dL (ref 0.2–1.0)
CALCIUM: 8.6 mg/dL (ref 8.5–10.1)
CO2: 34 mmol/L — AB (ref 21–32)
Chloride: 99 mmol/L (ref 98–107)
Creatinine: 1.13 mg/dL (ref 0.60–1.30)
EGFR (African American): >60
EGFR (Non-African Amer.): >60
GLUCOSE: 228 mg/dL — AB (ref 65–99)
OSMOLALITY: 291 (ref 275–301)
POTASSIUM: 3.9 mmol/L (ref 3.5–5.1)
SGOT(AST): 12 U/L — ABNORMAL LOW (ref 15–37)
SGPT (ALT): 16 U/L (ref 14–63)
SODIUM: 140 mmol/L (ref 136–145)
Total Protein: 7.2 g/dL (ref 6.4–8.2)

## 2014-11-15 LAB — CBC CANCER CENTER
Basophil #: 0 x10 3/mm (ref 0.0–0.1)
Basophil %: 0.3 %
Eosinophil #: 0.1 x10 3/mm (ref 0.0–0.7)
Eosinophil %: 0.9 %
HCT: 33 % — AB (ref 40.0–52.0)
HGB: 10.6 g/dL — ABNORMAL LOW (ref 13.0–18.0)
LYMPHS ABS: 3.1 x10 3/mm (ref 1.0–3.6)
LYMPHS PCT: 30.7 %
MCH: 25.9 pg — ABNORMAL LOW (ref 26.0–34.0)
MCHC: 32.2 g/dL (ref 32.0–36.0)
MCV: 81 fL (ref 80–100)
MONOS PCT: 5.1 %
Monocyte #: 0.5 x10 3/mm (ref 0.2–1.0)
NEUTROS ABS: 6.3 x10 3/mm (ref 1.4–6.5)
Neutrophil %: 63 %
Platelet: 285 x10 3/mm (ref 150–440)
RBC: 4.1 10*6/uL — AB (ref 4.40–5.90)
RDW: 18.1 % — ABNORMAL HIGH (ref 11.5–14.5)
WBC: 10 x10 3/mm (ref 3.8–10.6)

## 2014-12-13 ENCOUNTER — Ambulatory Visit: Payer: Self-pay | Admitting: Oncology

## 2014-12-14 ENCOUNTER — Ambulatory Visit
Admit: 2014-12-14 | Disposition: A | Discharge status: Home / Self Care | Payer: Self-pay | Attending: Oncology | Admitting: Oncology

## 2015-01-14 ENCOUNTER — Ambulatory Visit
Admit: 2015-01-14 | Disposition: A | Discharge status: Home / Self Care | Payer: Self-pay | Attending: Oncology | Admitting: Oncology

## 2015-01-17 LAB — COMPREHENSIVE METABOLIC PANEL
ALBUMIN: 3.7 g/dL
ALK PHOS: 76 U/L
ALT: 15 U/L — AB
ANION GAP: 5 — AB (ref 7–16)
BUN: 15 mg/dL
Bilirubin,Total: 0.5 mg/dL
CALCIUM: 8.8 mg/dL — AB
Chloride: 99 mmol/L — ABNORMAL LOW
Co2: 35 mmol/L — ABNORMAL HIGH
Creatinine: 1.23 mg/dL
EGFR (African American): >60
EGFR (Non-African Amer.): >60
Glucose: 169 mg/dL — ABNORMAL HIGH
Potassium: 3.8 mmol/L
SGOT(AST): 20 U/L
SODIUM: 139 mmol/L
Total Protein: 6.6 g/dL

## 2015-01-17 LAB — MAGNESIUM: MAGNESIUM: 1.3 mg/dL — AB

## 2015-01-17 LAB — CBC CANCER CENTER
BASOS PCT: 0.4 %
Basophil #: 0 x10 3/mm (ref 0.0–0.1)
Eosinophil #: 0.2 x10 3/mm (ref 0.0–0.7)
Eosinophil %: 2.9 %
HCT: 32.6 % — ABNORMAL LOW (ref 40.0–52.0)
HGB: 10.5 g/dL — AB (ref 13.0–18.0)
LYMPHS PCT: 32.1 %
Lymphocyte #: 2.2 x10 3/mm (ref 1.0–3.6)
MCH: 26.8 pg (ref 26.0–34.0)
MCHC: 32.2 g/dL (ref 32.0–36.0)
MCV: 83 fL (ref 80–100)
MONO ABS: 0.6 x10 3/mm (ref 0.2–1.0)
Monocyte %: 9.5 %
NEUTROS ABS: 3.7 x10 3/mm (ref 1.4–6.5)
NEUTROS PCT: 55.1 %
Platelet: 157 x10 3/mm (ref 150–440)
RBC: 3.91 10*6/uL — ABNORMAL LOW (ref 4.40–5.90)
RDW: 18.6 % — AB (ref 11.5–14.5)
WBC: 6.8 x10 3/mm (ref 3.8–10.6)

## 2015-01-24 LAB — CREATININE, SERUM: Creatine, Serum: 1.23

## 2015-02-03 ENCOUNTER — Other Ambulatory Visit: Payer: Self-pay | Admitting: Family Medicine

## 2015-02-03 DIAGNOSIS — C641 Malignant neoplasm of right kidney, except renal pelvis: Secondary | ICD-10-CM

## 2015-02-03 DIAGNOSIS — C8300 Small cell B-cell lymphoma, unspecified site: Secondary | ICD-10-CM | POA: Insufficient documentation

## 2015-02-03 DIAGNOSIS — C3411 Malignant neoplasm of upper lobe, right bronchus or lung: Secondary | ICD-10-CM

## 2015-02-03 DIAGNOSIS — C649 Malignant neoplasm of unspecified kidney, except renal pelvis: Secondary | ICD-10-CM | POA: Insufficient documentation

## 2015-02-03 DIAGNOSIS — C3410 Malignant neoplasm of upper lobe, unspecified bronchus or lung: Secondary | ICD-10-CM | POA: Insufficient documentation

## 2015-02-04 NOTE — Consult Note (Signed)
History of Present Illness:   Reason for Consult Hemoptysis and shortness of breath he the patient with carcinoma of lung1. Right upper lobe lung mass biopsies positive for adenocarcinoma.   Based on PET scan patient has T2, N2, M0 tumor stage III a 2. Poor pulmonary function 3. Patient was started on carboplatinum Alimta and Avastin   on april  9th of 2013 4. Patient had an allergic reaction to carboplatinum with acute bronchospasm in May of 2013.  Patient is now being taken off carboplatinum. 5. Started on maintenance Alimta and Avastin  from June of 2013 hold chemotherapy because of increasing shortness of breath (in July, 2013)    HPI   patient came to emergency room with history of increasing shortnes.  Hemoptysis which is gradually   increasing .admitted in the hospital the diagnosis of pneumonia and started on IV antibiotics.  CT scan being planned.  I was asked to  reevaluate patient .antibiotic   and  steroid  arted   was stpatient's general condition is improved.also has been seen by a pulmonologist.  PFSH:   Comments history of colon cancer, doubt, diabetes, tuberculosis    Comments has been a chronic smoker for more than 25 years pack a day history does not drink.  Works in the [employer]    Additional Past Medical and Surgical History diabetes, type II,.  Hypertensin, history of stroke, chronic obstructive pulmonary disease,.  glaucoma.  History of pancreatitis and cholecystitis   Review of Systems:   General chills  weakness    Performance Status (ECOG) 1    HEENT no complaints    Lungs cough  SOB    Cardiac no complaints    GI no complaints    GU no complaints    Musculoskeletal no complaints    Extremities no complaints    Skin no complaints    Neuro no complaints    Endocrine no complaints    Psych no complaints    Review of Systems   general condition has improved after patient was admitted in the hospital  NURSING NOTES: Interdisciplinary  Discharge Planning:   08-Jan-14 14:00    Advance Directive ([place]): no    Advance Directive Information Given: patient refused  NURSING NOTES: **Vital Signs.:   09-Jan-14 08:29    Vital Signs Type: Q 4hr    Temperature Temperature (F): 97.8    Celsius: 36.5    Pulse Pulse: 99    Respirations Respirations: 22    Systolic BP Systolic BP: 251    Diastolic BP (mmHg) Diastolic BP (mmHg): 87    Mean BP: 103    Pulse Ox % Pulse Ox %: 99    Pulse Ox Activity Level: At rest    Oxygen Delivery: 2L   Physical Exam:   General Patient is alert and oriented not in any acute distress    HEENT: normal    Lungs: rales  crepitations  wheezing    Cardiac: regular rate, rhythm    Abdomen: soft  nontender  positive bowel sounds    Skin: intact    Musculoskeletal: swollen/deformed joints    Extremities: No edema, rash or cyanosis    Neuro: AAOx3  cranial nerves intact    Psych: normal appearance    Physical Exam LYMPHATICS:   No cervical, axillary, or inguinal lymphadenopathy     acute resp failure: 12-Jul-2010   lung ca:    CVA/Stroke:    HTN:    Diabetes Mellitus, Type II (NIDD):  Nexium: Headaches  Iodine: N/V/Diarrhea, Resp. Distress    predniSONE 10 mg oral tablet: tab(s) orally once a day tapering per dosepak instructions for 12 days, Active, 12, None   pioglitazone 30 mg oral tablet: 1 tab(s) orally once a day, Active, 0, None   zolpidem 10 mg oral tablet: 1 tab(s) orally once a day (at bedtime), Active, 0, None   theophylline 200 mg oral tablet, extended release: 1 tab(s) orally 2 times a day, Active, 0, None   albuterol 2.5 mg/3 mL (0.083%) solution: 3 milliliter(s) inhaled every 6 hours, As Needed- for Shortness of Breath , Active, 144, None   albuterol CFC free 90 mcg/inh aerosol: 2 puff(s) inhaled every 6 hours, As Needed- for Shortness of Breath , Active, 240, None   Flonase 50 mcg/inh spray: 1 spray(s) nasal once a day, As  Needed for rhinitis, Active, 30, 4   hydrochlorothiazide-losartan 12.5 mg-50 mg oral tablet: 1 tab(s) orally once a day, Active, 0, None   Spiriva 18 mcg inhalation capsule: 1 each inhaled once a day, Active, 0, None   Symbicort 160 mcg-4.5 mcg/inh inhalation aerosol: 2 puff(s) inhaled 2 times a day, Active, 0, None   Travatan Z 0.004% ophthalmic solution: 1 drop(s) to each eye once a day (in the evening), Active, 0, None  Laboratory Results: Hepatic:  08-Jan-14 08:52    Bilirubin, Total 0.2   Alkaline Phosphatase 85   SGPT (ALT) 16   SGOT (AST)  13   Total Protein, Serum 7.6   Albumin, Serum 3.5  TDMs:  09-Jan-14 05:26    Theophylline, Serum  3.3 (Result(s) reported on 23 Oct 2012 at 06:16AM.)  Routine Chem:  08-Jan-14 08:52    BUN  19   Creatinine (comp) 1.17   Sodium, Serum 139   Potassium, Serum 4.1   Chloride, Serum 103   CO2, Serum 29   Calcium (Total), Serum 9.1   Osmolality (calc) 282   eGFR (African American) >60   eGFR (Non-African American) >60 (eGFR values <62mL/min/1.73 m2 may be an indication of chronic kidney disease (CKD). Calculated eGFR is useful in patients with stable renal function. The eGFR calculation will not be reliable in acutely ill patients when serum creatinine is changing rapidly. It is not useful in  patients on dialysis. The eGFR calculation may not be applicable to patients at the low and high extremes of body sizes, pregnant women, and vegetarians.)   Anion Gap 7  Routine Hem:  08-Jan-14 08:52    WBC (CBC)  11.4   RBC (CBC)  4.02   Hemoglobin (CBC)  10.9   Hematocrit (CBC)  33.5   Platelet Count (CBC) 182 (Result(s) reported on 22 Oct 2012 at 09:06AM.)   MCV 83   MCH 27.2   MCHC 32.6   RDW  17.5   Assessment and Plan:  Impression:   1. Carcinoma of lung, adenocarcinoma.  Progressing disease.discuss possibility of radiation thcontinue antibiotics  Hypoxia. Recently discharged from the hospital with acute bronchitis. Patient  remains on 2L oxygen. Will continue previously ordered prednisone and antibiotics. He is also to continue with SVNs around the clock, symbicort and spiriva inhalers.   2.anemia we will follow carefullyis 10.9 stablescan of the chest has been  reviewed  independently. right lower lobe mass has been progressing.mass is stable and may need further evaluation   Plan:   continue IV steroid IV antibioticsfollowed this patient as outpatie further options of treatment as well evaluation regarding kidney mass  Electronic Signatures: Kamoria Lucien,  Martie Lee (MD)  (Signed 09-Jan-14 19:47)  Authored: HISTORY OF PRESENT ILLNESS, PFSH, ROS, NURSING NOTES, PE, PAST MEDICAL HISTORY, ALLERGIES, HOME MEDICATIONS, LABS, ASSESSMENT AND PLAN   Last Updated: 09-Jan-14 19:47 by Jobe Gibbon (MD)

## 2015-02-04 NOTE — H&P (Signed)
PATIENT NAME:  Craig [name], Craig [name] MR#:  [id number] DATE OF BIRTH:  1951/05/10  DATE OF ADMISSION:  10/22/2012  PRIMARY CARE PHYSICIAN: [name], MD  PULMONOLOGIST: Wallene Huh, MD  ONCOLOGIST: Forest Gleason, MD  CHIEF COMPLAINT: Hemoptysis and shortness of breath.   HISTORY OF PRESENT ILLNESS: This is a 64 year old male who presents to the Emergency Room due to worsening shortness of breath and hemoptysis. The patient was treated with and a round of antibiotics and prednisone taper for some shortness of breath in earlier part of last year. He finished his antibiotics just before New Year's and just finished his prednisone taper a couple of days ago. He has noticed that his shortness of breath has not improved and ne now continues to have some hemoptysis where his sputum is mixed with some bright red blood. The hemoptysis continued to get worse so therefore he was a bit worried and he came to urgent care at [facility] and he was then referred to the ER for further evaluation. In the Emergency Room, the patient continued to have hemoptysis. Hemoglobin was noted to be stable, but he was noted to be significantly short of breath. Hospitalist services were contacted for further treatment and evaluation.   REVIEW OF SYSTEMS:  CONSTITUTIONAL: No documented fever. No weight gain. No weight loss.   EYES: No blurred or double vision.   ENT: No tinnitus. No postnasal drip. No redness of the oropharynx.   RESPIRATORY: Positive cough. Positive wheeze. Positive hemoptysis. Positive dyspnea on exertion. Positive COPD.  CARDIOVASCULAR: No chest pain. No orthopnea. No palpitations. No syncope.   GASTROINTESTINAL: No nausea. No vomiting. No diarrhea. No abdominal pain. No melena or hematochezia.   GENITOURINARY: No dysuria. No hematuria.   ENDOCRINE: No polyuria or nocturia. No heat or cold intolerance.   HEME/LYMPH: No anemia. No bruising. No bleeding.   INTEGUMENTARY: No rashes. No  lesions.   MUSCULOSKELETAL: No arthritis. No swelling. No gout.   NEUROLOGIC: No numbness. No tingling. No ataxia. No seizure-type activity.   PSYCH: No anxiety. No insomnia. No ADD.   PAST MEDICAL HISTORY: 1. Hypertension. 2. Diabetes. 3. History of adenocarcinoma of the lung.  4. History of COPD.  DRUG ALLERGIES: Nexium.   SOCIAL HISTORY: He used to be a smoker, quit about 3 to 4 years ago. Does have a 30 to 40 pack-year smoking history. No alcohol abuse. No illicit drug abuse. Lives at home by himself.   FAMILY HISTORY: The patient's mother is alive. She has diabetes and high blood pressure. Father died from complications of tuberculosis.   CURRENT MEDICATIONS: 1. Albuterol nebulizers q. 6 hours as needed.  2. Albuterol inhaler 2 puffs q. 6 hours as needed. 3. Flonase one spray to each nostril daily as needed. 4. HCTZ/losartan 12.5/50 mg 1 tab daily.  5. Actos 30 mg daily.  6. Spiriva 1 puff daily.  7. Symbicort 2 puffs 2 times a day, which is 160 mcg/4.5 mcg.  8. Theophylline 200 mg 2 times a day. 9. Travatan eyedrops 0.004% at bedtime to both eyes. 10. Ambien 10 mg at bedtime as needed.   PHYSICAL EXAMINATION ON ADMISSION:   VITALS: Temperature 98.1, pulse 76, respirations 16, blood pressure 101/64 and saturations 98% on room air.   GENERAL: He is a pleasant appearing male in no apparent distress.   HEENT: Atraumatic, normocephalic. Extraocular muscles are intact. Pupils are equally round and reactive to light. Sclerae anicteric. No conjunctival injection. No pharyngeal erythema.   NECK: Supple. No jugular  venous distention. No bruits, no lymphadenopathy and no thyromegaly.   HEART: Regular rate and rhythm. No murmurs, no rubs and no clicks.   LUNGS: He has some coarse rhonchi and wheezing diffusely, positive end-expiratory wheezing. Negative use of accessory muscles. No dullness to percussion.   ABDOMEN: Soft, flat, nontender and nondistended. He has good bowel  sounds. No hepatosplenomegaly appreciated.   EXTREMITIES: No evidence of any cyanosis, clubbing or peripheral edema. Has +2 pedal and radial pulses bilaterally.   NEUROLOGICAL: The patient is alert, awake and oriented x 3 with no focal motor or sensory deficits appreciated bilaterally.   SKIN: Moist and warm with no rashes appreciated.   LYMPH: There is no cervical or axillary lymphadenopathy.   LABS/RADIOLOGIC STUDIES: Serum glucose 144, BUN 19, creatinine 1.17, sodium 139, potassium 4.1, chloride 103 and bicarbonate 29. LFTs are within normal limits. White cell count 11.4, hemoglobin 10.9, hematocrit 33.5 and platelet count 182.   The patient did have a chest x-ray done which showed right perihilar mass consistent with malignancy.   ASSESSMENT AND PLAN: This is a 64 year old male with a history of COPD, history of adeno CA of the lung, hypertension and diabetes who presents to the hospital with shortness of breath and hemoptysis.  1. Chronic obstructive pulmonary disease exacerbation, likely the cause of the patient's shortness of breath and this is likely the result of acute bronchitis. The patient just finished therapy with a prednisone taper and IV Levaquin, but has not improved. Therefore, I will admit the patient and start him on IV steroids, continue around-the-clock nebulizer treatments, continue his Symbicort and Spiriva, continue IV Levaquin, follow sputum and blood cultures and follow him clinically.  2. Hemoptysis. I think this is likely related to his underlying adeno CA of the lung. His hemoglobin is stable. His chest x-ray is suggestive of lung mass, which is slightly enlarged. I will get pulmonary and oncology consult, the patient is well known to Dr. Raul Del and also Dr. Oliva Bustard, to further evaluate his hemoptysis.  3. Hypertension. Continue HCTZ and losartan.  4. Diabetes. Continue Actos.   CODE STATUS: The patient is a FULL CODE.   TIME SPENT: 45 minutes.   ____________________________ Belia Heman. Verdell Carmine, MD vjs:sb D: 10/22/2012 13:01:19 ET T: 10/22/2012 13:34:58 ET JOB#: [id number]  cc: Belia Heman. Verdell Carmine, MD, <Dictator> Henreitta Leber MD ELECTRONICALLY SIGNED 10/22/2012 17:08

## 2015-02-04 NOTE — Discharge Summary (Signed)
PATIENT NAME:  Craig [name], Craig [name] MR#:  [id number] DATE OF BIRTH:  10/11/1951  DATE OF ADMISSION:  10/22/2012 DATE OF DISCHARGE:  10/24/2012  ADMISSION DIAGNOSIS: Hemoptysis.   DISCHARGE DIAGNOSES: 1. Hemoptysis.  2. Mild chronic obstructive pulmonary disease exacerbation.  3. Hypertension.  4. History of lung cancer.  5. Diabetes.   CONSULTANTS: 1. Wallene Huh, MD. 2. Forest Gleason, MD.   RADIOLOGIC STUDIES: CT of chest without contrast showed slightly decreased size of a right paraspinous mass. There are findings of mild narrowing of the distal right main stem bronchus as well as the bronchus of the right lower lobe and possibly right middle lobe. There is a persistent solid nodule in the right kidney.   HOSPITAL COURSE: This is a 64 year old male who presented with hemoptysis and history of known lung cancer. For further details, please refer to the H and P. 1. Hemoptysis, nonmassive, likely from bronchitis versus lung mass. Pulmonary and oncology were consulted for evaluation. They did not feel that the hemoptysis was largely due to the lung mass. His hemoptysis actually resolved prior to admission. He was treated for COPD and bronchitis.  2. COPD, mild exacerbation. The patient was started on IV steroids, Levaquin and Oxygen. He is actually doing quite well. He has good air movement with minimal wheezing at discharge. He will be discharged with his home oxygen.  3. Hypertension, well controlled and stable.  4. Diabetes. The patient was continued on his outpatient medications.  5. History of lung cancer and CT scan findings of the kidney as above. Dr. Oliva Bustard will see the patient as an outpatient for continuing followup.   DISCHARGE MEDICATIONS: 1. Actos 30 mg daily.  2. Ambien 10 mg at bedtime.  3. Theophylline 200 mg 2 times a day.  4. Albuterol 3 mL q. 6 hours p.r.n. shortness of breath.  5. Albuterol CFC 90 mcg two puffs inhaled q. 6 hours as needed for shortness of breath.   6. Flonase 50 mcg daily as needed for rhinitis.  7. HCTZ/losartan 12.5/50 mg daily.  8. Spiriva 18 mcg daily.  9. Symbicort 2 puffs 2 times a day. 10. Travatan 0.004% one drop to each eye in the evening.  11. Prednisone taper 10 mg tablets 6 tablets daily x 3 days and taper x 10 mg every 3 days.  12. Levaquin 750 mg every 24 hours x 5 days.  Prescriptions were electronically submitted as well as printed and placed on the chart, the prednisone was.   DISCHARGE OXYGEN: 2 liters nasal cannula.   DISCHARGE DIET: Low sodium.  DISCHARGE FOLLOW-UP:  The patient will follow up next week with Dr. Oliva Bustard.  TIME SPENT: Approximately 35 minutes.  ____________________________ Donell Beers. Benjie Karvonen, MD spm:sb D: 10/24/2012 13:06:49 ET T: 10/24/2012 14:42:35 ET JOB#: [id number]  cc: Joelys Staubs P. Benjie Karvonen, MD, <Dictator> Martie Lee. Oliva Bustard, MD Donell Beers Rudolph Daoust MD ELECTRONICALLY SIGNED 10/26/2012 19:47

## 2015-02-05 NOTE — H&P (Signed)
PATIENT NAME:  Craig [name], Craig [name] MR#:  [id number] DATE OF BIRTH:  01-16-1951  DATE OF ADMISSION:  08/26/2014  PRIMARY CARE PHYSICIAN:  Dr. Lovie Macadamia.    REFERRING PHYSICIAN:  Dr. Edd Fabian.   CHIEF COMPLAINT: Shortness of breath, cough for 1 week, worsening for 2 days.   HISTORY OF PRESENT ILLNESS: A 64 year old African-American male with a history of hypertension, diabetes, COPD, lung cancer, presented to the ED with above chief complaint. The patient is alert, awake, oriented, in no acute distress. The patient said that he started to have shortness of breath several weeks ago, but worsening for the past 1-2 days. In addition the patient has a cough, sputum, and wheezing, but the patient denies any fever or chills. The patient denies any chest pain, palpitation, orthopnea, or nocturnal dyspnea. He denies any leg edema. The patient said that he is taking prednisone, the dose was decreased to 10 mg p.o. daily. The patient has high blood sugar above 300 for the past 2 days. The patient denies any other symptoms.   PAST MEDICAL HISTORY: Hypertension, diabetes, COPD, lung cancer. The patient has history of CVA and chronic respiratory failure on home oxygen 2 liters.   SOCIAL HISTORY: Quit smoking 4 years ago. No alcohol drinking or illicit drugs.   FAMILY HISTORY: Mother had diabetes, hypertension. Father died of complication of tuberculosis   PAST SURGICAL HISTORY:  No.    ALLERGIES:  IODINE AND NEXIUM.   REVIEW OF SYSTEMS:  CONSTITUTIONAL: The patient denies any fever or chills. No headache or dizziness. No weakness.  EYES: No double vision or blurred vision.  EARS, NOSE, AND THROAT: No postnasal drip, slurred speech, or dysphagia.  CARDIOVASCULAR: No chest pain, palpitation, orthopnea, or nocturnal dyspnea. No leg edema.  PULMONARY: Positive for cough, sputum, shortness of breath. No hemoptysis.   GASTROINTESTINAL: No abdominal pain, nausea, vomiting, diarrhea. No melena or bloody stool.   GENITOURINARY: No dysuria, hematuria, or incontinence.  SKIN: No rash or jaundice.  NEUROLOGIC: No syncope, loss of consciousness, or seizure.  ENDOCRINE: No polyuria, polydipsia, heat or cold intolerance.  HEMATOLOGY: No easy bruising or bleeding.   PHYSICAL EXAMINATION:   VITAL SIGNS:  Temperature 98.2, blood pressure 134/92, pulse 145, respirations 27, O2 saturation 97% on oxygen 2 liters.  GENERAL: The patient is alert, awake, oriented, in no acute distress.  HEENT: Pupils round, equal, and reactive to light and accommodation. Moist oral mucosa. Clear oropharynx.  NECK: Supple. No JVD or carotid bruit. No lymphadenopathy. No thyromegaly.  CARDIOVASCULAR: S1, S2 regular rate, rhythm. No murmurs or gallops. Tachycardia.  PULMONARY: Bilateral air entry, severe expiratory wheezing and rhonchi. No rales. No use of accessory muscle to breathe.   ABDOMEN: Soft. No distention or tenderness. No organomegaly. Bowel sounds present.  EXTREMITIES: No edema, clubbing, or cyanosis. No calf tenderness. Bilateral pedal present.  SKIN: No rash or jaundice.  NEUROLOGIC: A and O x 3. No focal deficit. Power 5 out of 5. Sensory intact.    LABORATORY DATA:  Lung V/Q scan is low probability for PE, large right upper lobe ventilation perfusion defect consistent with known central right upper lobe mass. Urinalysis is negative. Lactic acid 1.0. Venous pH 7.48, pCO2 of 44. Chest x-ray shows stable right upper lobe mass, Port-A-Cath in stable position, no active cardiopulmonary disease. WBC 14.7, hemoglobin 9.9, platelets 216,000. Troponin less than 0.02. Glucose 239, BUN 14, creatinine 0.97, electrolyte normal except potassium 3.4.   EKG shows sinus tachycardia with frequent PVCs at 129 bpm.  IMPRESSIONS:  1.  Chronic obstructive pulmonary disease exacerbation.  2.  Sepsis with acute bronchitis, need to rule out pneumonia.  3.  Hypokalemia.  4.  Sinus tachycardia.  5.  Hypertension.  6.  Chronic respiratory  failure.  7.  Hypertension.  8.  Diabetes.  9.  History of cerebrovascular accident.   PLAN OF TREATMENT:  1.  The patient will be admitted to medical floor. We will continue telemonitor, start Solu-Medrol IV with Xopenex around-the-clock. In addition we will continue Spiriva and theophylline. 2.  For acute bronchitis with possible pneumonia the patient was treated with vancomycin and Zosyn in ED, we will continue Zosyn and start Levaquin. Follow up sputum culture, blood culture, and also follow up CBC.  3.  Hypertension. We will continue HCTZ and losartan.  4.  For diabetes we will hold glipizide and start a sliding scale and Levemir 10 units subcutaneous at bedtime, monitor blood sugar and adjust the dose depending on the patient's blood sugar.  5.  I discussed the patient's condition and plan of treatment with the patient.  The patient wants DNR.  He said he has DNR paper.    TIME SPENT: About 55 minutes.    ____________________________ Demetrios Loll, MD qc:bu D: 08/26/2014 20:02:18 ET T: 08/26/2014 20:56:46 ET JOB#: [id number]  cc: Demetrios Loll, MD, <Dictator> Demetrios Loll MD ELECTRONICALLY SIGNED 08/31/2014 1:00

## 2015-02-05 NOTE — Discharge Summary (Signed)
PATIENT NAME:  Craig [name], COOMES MR#:  [id number] DATE OF BIRTH:  10/16/1950  DATE OF ADMISSION:  08/26/2014 DATE OF DISCHARGE:  08/29/2014  ADMITTING PHYSICIAN: Demetrios Loll, MD  DISCHARGING PHYSICIAN: Gladstone Lighter, MD  PRIMARY CARE PHYSICIAN: Youlanda Roys. Lovie Macadamia, MD  PRIMARY PULMONOLOGIST: [place] Raul Del, MD  PRIMARY ONCOLOGIST: Martie Lee. Choksi, MD  [place]: Pulmonary consultation by Herbon E. Raul Del, MD.   DISCHARGE DIAGNOSES:  1.  Acute on chronic obstructive pulmonary disease exacerbation.  2.  Acute bronchitis.  3.  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 4.  Renal cell carcinoma.  5.  Hypertension.  6.  History of stroke.  7.  Diabetes mellitus.    DISCHARGE MEDICATIONS:  1.  Actos 30 mg p.o. daily.  2.  Ambien 10 mg p.o. at bedtime.  3.  Theophylline 200 mg p.o. b.i.d.  4.  Albuterol nebulizer 3 mL q. 6 hours p.r.n. for shortness of breath.  5.  Albuterol inhaler 2 puffs q. 6 hours p.r.n.  6.  Losartan/hydrochlorothiazide 50/12.5 mg 1 tablet p.o. daily.   7.  Spiriva inhalation capsule 1 capsule daily.  8.  Symbicort 160/4.5 mcg 2 puffs b.i.d.  9.  Travatan 0.004% ophthalmic solution 1 drop to each eye once a day.  10.  Glipizide 10 mg p.o. daily.  11.  Benadryl 25 mg p.o. daily p.r.n.  12.  Imodium 1 tablet 4 times a day as needed for diarrhea.  13.  Norco 5/325 mg 1 tablet q. 6 hours p.r.n. for pain.  14.  Prednisone taper.  15.  Prednisone 10 mg p.o. daily.  16.  Levaquin 500 mg p.o. daily for 5 days.  17.  Mucinex 600 mg p.o. b.i.d. p.r.n. for cough.  18.  Discharge home oxygen 2 liters.   DISCHARGE DIET: Low sodium and also ADA 1800-calorie diet.   DISCHARGE ACTIVITY: As tolerated.    FOLLOWUP INSTRUCTIONS:  1.  PCP followup in 2 weeks.  2.  Pulmonary followup in 1-2 weeks.  3.  Follow up with Dr. Oliva Bustard as prior scheduled.   LABORATORY AND IMAGING STUDIES: Prior to discharge,  WBC 14.7, hemoglobin 8.8, hematocrit 25.4, platelet count 190,000. Sodium 138, potassium 4.1, chloride 101, bicarbonate 29, BUN 28, creatinine 1.37, glucose 173, calcium of 8.0, magnesium 1.8. HbA1c 9.8. Lung V/Q scan showing low probability for PE, large right upper lobe ventilation perfusion defect consistent with central right upper lobe mass. Urinalysis negative for any infection. Chest x-ray on admission showed a stable right upper lobe mass.   BRIEF HOSPITAL COURSE: Mr. Strausser is a 64 year old African American male with known history of COPD, lung cancer, currently on chemotherapy, and chronic home oxygen 2 liters at home, diabetes, hypertension, presented to the hospital secondary to worsening shortness of breath and wheezing.  1.  Acute on chronic COPD exacerbation, significant wheezing on examination. Started on IV Solu-Medrol, nebulizers, and inhalers, and also started on Levaquin for bronchitis. Seen by pulmonary, Dr. Raul Del, in the hospital. The patient already on home inhalers and theophylline. Symptoms have improved on steroids, so he is being discharged home on p.o. steroid taper and also nebulizers, inhalers, and antibiotics. He was recommended to follow up with Dr. Raul Del as an outpatient.  2.  Lung cancer, right upper lobe lung mass, locally advanced recurrent non-small cell carcinoma of right lung, started on chemotherapy, following with Dr. Oliva Bustard as an outpatient.  3.  Renal cell carcinoma. Small renal mass diagnosed by biopsy  confirmed to be adenocarcinoma; being discussed in team conference about cryoablation.  4.  Non-insulin-dependent diabetes mellitus: Continue his home medications.  5.  Hypertension: Home medications.   His course has been otherwise uneventful in the hospital.   DISCHARGE CONDITION: Stable.   DISCHARGE DISPOSITION: Home.   TIME SPENT ON DISCHARGE: 40 minutes.    ____________________________ Gladstone Lighter, MD rk:ts D: 08/29/2014 11:57:00  ET T: 08/29/2014 18:48:30 ET JOB#: [id number]  cc: Gladstone Lighter, MD, <Dictator> Janak K. Choksi, MD Herbon E. Raul Del, MD Youlanda Roys. Lovie Macadamia, MD Gladstone Lighter MD ELECTRONICALLY SIGNED 09/04/2014 15:03

## 2015-02-06 NOTE — Discharge Summary (Signed)
PATIENT NAME:  Craig [name], Craig [name] MR#:  [id number] DATE OF BIRTH:  1950/11/15  DATE OF ADMISSION:  04/05/2012 DATE OF DISCHARGE:  04/07/2012  For a detailed note, please take a look at the history and physical done on admission by Dr. Inez Catalina.   DIAGNOSES AT DISCHARGE:  1. Chronic obstructive pulmonary disease exacerbation.  2. History of lung cancer.  3. Diabetes.  4. Hypertension.  5. Gastroesophageal reflux disease.   DIET: The patient is being discharged on an American Diabetic Association low sodium, low fat diet.   ACTIVITY: As tolerated.   FOLLOW-UP: Follow-up with Dr. Oliva Bustard in the next 1 to 2 weeks.   DISCHARGE MEDICATIONS:  1. Spiriva 1 puff daily.  2. Travatan eyedrops one drop to each eye daily.  3. Symbicort 2 puffs b.i.d.  4. Losartan/HCTZ 50/12.5 2 tabs daily.  5. Actos 30 mg daily.  6. Flonase one spray to each nostril daily. 7. Albuterol nebulizer q.6 hours as needed.  8. Albuterol inhaler 2 puffs q.4 hours as needed. 9. Folic acid 1 tab daily.  10. Ambien 10 mg at bedtime.  11. Prednisone taper starting at 50 mg to be tapered over the next 10 days.   PERTINENT STUDIES DONE DURING THE HOSPITAL COURSE: Chest x-ray done on admission showing COPD with right suprahilar ill-defined density at the site of the patient's known underlying malignancy.   Sputum cultures appeared to be consistent with normal flora. Blood cultures negative.   HOSPITAL COURSE: This is a 64 year old male with medical problems as mentioned above who presented to the hospital with shortness of breath and hypoxia.  1. Chronic obstructive pulmonary disease exacerbation. This was likely the cause of the patient's shortness of breath. The patient apparently was being treated as an outpatient on p.o. antibiotics and some nebulizer treatments but was not improving and, therefore, admitted and started on IV steroids and also Zithromax. After being in the hospital for the past few days, the patient's  clinical symptoms have significantly improved. He is now back down to nasal cannula. He was originally started on BiPAP when he first presented. His respiratory failure has significantly improved. He is, therefore, being discharged back on his maintenance inhalers along with home oxygen therapy and also a prednisone taper as stated. The patient did have a chest x-ray done which showed no evidence of any acute pneumonia but chronic changes consistent with chronic obstructive pulmonary disease and his previous lung cancer.  2. Acute renal failure. This was likely secondary to prerenal azotemia. It improved with some gentle IV fluid hydration. His losartan and hydrochlorothiazide were held but they can be resumed upon discharge.  3. Hypertension. As mentioned, the patient's antihypertensives were held due to acute renal failure. He can now resume that upon discharge as his renal function is improved.  4. Glaucoma. The patient was maintained on his Travatan eyedrops. He will resume that.  5. Diabetes. The patient was maintained on his Actos and sliding scale insulin and will resume his Actos upon discharge.   CODE STATUS: The patient is a FULL CODE.   DISPOSITION: He is being discharged back home with close follow-up with his oncologist, Dr. Oliva Bustard, and also Dr. Raul Del, his pulmonologist.   TIME SPENT WITH THE DISCHARGE: 35 minutes.   ____________________________ Belia Heman. Verdell Carmine, MD vjs:drc D: 04/07/2012 14:44:57 ET T: 04/08/2012 11:23:28 ET JOB#: [id number] cc: Belia Heman. Verdell Carmine, MD, <Dictator>, Martie Lee. Oliva Bustard, MD Henreitta Leber MD ELECTRONICALLY SIGNED 04/23/2012 12:23

## 2015-02-06 NOTE — H&P (Signed)
PATIENT NAME:  Craig [name], Craig [name] MR#:  [id number] DATE OF BIRTH:  1951-01-31  DATE OF ADMISSION:  04/05/2012  REFERRING PHYSICIAN: Dr. Beather Arbour PRIMARY CARE PHYSICIAN: Dr. Lovie Macadamia  PRIMARY ONCOLOGIST: Dr. Oliva Bustard  PRESENTING COMPLAINT: Cough, shortness of breath, chills.   HISTORY OF PRESENT ILLNESS: Craig [name] is a 64 year old gentleman with history of lung adenocarcinoma status post chemoradiation, prior history of tobacco use, CVA, hypertension, diabetes, chronic obstructive pulmonary disease on chronic oxygen of at least 2 liters who presents with reports of developing cough that has been productive yellow sputum since five days ago. He denies any chest pain, palpitations or syncope. Reports that he was seen at oncology clinic and was administered what sounds like Levaquin, however, patient has not yet completed Levaquin. His symptoms actually have not improved and was switched to a new antibiotic the name of which patient cannot recall.   PAST MEDICAL HISTORY:  1. Last admitted September 2011 for management of chronic obstructive pulmonary disease exacerbation.  2. Chronic obstructive pulmonary disease on chronic oxygen of at least 2 liters.  3. 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  4. History of CVA. 5. Hypertension. 6. Diabetes.  7. Glaucoma.  8. History of pancreatitis.   PAST SURGICAL HISTORY: Port-A-Cath placement.   ALLERGIES: Nexium, which causes headaches.   MEDICATIONS:  1. Albuterol nebulizers every six hours as needed.  2. Albuterol inhaler 2 puffs q.i.d. as needed.  3. Flonase 50 mcg inhaler spray daily.  4. Folic acid 0.8 mg daily.  5. Losartan/HCTZ 50/12.5 mg b.i.d.  6. Pioglitazone 30 mg daily.  7. ProAir 90 mcg inhaled daily.  8. Symbicort 160/4.5 b.i.d.  9. Travatan 0.004% one drop to each eye daily.  10. Zolpidem 10 mg daily.   SOCIAL HISTORY: Lives in [place] with a friend. He quit tobacco in 2011. No alcohol or drug  use.   FAMILY HISTORY: Leukemia, lung cancer, colon cancer.   REVIEW OF SYSTEMS: CONSTITUTIONAL: Endorses chills. No nausea, vomiting. EYES: History of glaucoma. ENT: No epistaxis, discharge. RESPIRATORY: As per history of present illness. CARDIOVASCULAR: No chest pain, orthopnea, palpitations, syncope. GASTROINTESTINAL: No nausea, vomiting, diarrhea. GENITOURINARY: No dysuria, hematuria. ENDO: No polyuria or polydipsia. HEMATOLOGIC: No bleeding. SKIN: No ulcers. NEUROLOGIC: No dysarthria or aphasia. Symmetrical strength. No focal deficits. PSYCH: He is alert and oriented. Patient is anxious.   LABORATORY, DIAGNOSTIC AND RADIOLOGICAL DATA: ABG with pH 7.37, pCO2 51, pO2 87 on BiPAP. WBC 8.1, hemoglobin 9.1, hematocrit 28.6, platelets 322, MCV 86, glucose 164, BUN 17, creatinine 1.59, sodium 137, potassium 3, chloride 99, carbon dioxide 29, calcium 8.2, alkaline phosphatase 74, ALT 19, AST 26, total protein 7.5. Troponin 0.04. EKG with sinus rate of 118. No ST elevation or depression. No Q waves noted.   ASSESSMENT AND PLAN: Craig [name] is a 64 year old gentleman with history of lung adenocarcinoma status post chemoradiation, prior tobacco use, chronic obstructive pulmonary disease on chronic oxygen, diabetes, CVA, hypertension presenting with complaints of productive cough, chills, shortness of breath. 1. Acute on chronic respiratory failure/chronic obstructive pulmonary disease exacerbation. No evidence of infection on x-ray. Will continue on BiPAP. Sputum culture, blood culture have been sent. Will continue azithromycin given his recent initiation on Levaquin. Continue Solu-Medrol, SVNs, Spiriva, Symbicort.  2. Dehydration/acute renal failure. Gentle IV fluids for the next 24 hours. Hold his losartan/HCT.  3. Hypokalemia. Will send magnesium level. Replace as needed.  4. Anemia, stable.  5. Diabetes. Resume pioglitazone and start sliding scale insulin.  6. Prophylaxis. Aspirin, TEDs and SCDs.   TIME  SPENT: Approximately 50 minutes spent on patient care.    ____________________________ Rita Ohara, MD ap:cms D: 04/05/2012 04:55:22 ET T: 04/05/2012 09:19:19 ET JOB#: [id number]  cc: Brien Few Kosisochukwu Goldberg, MD, <Dictator> Youlanda Roys. Lovie Macadamia, MD Rita Ohara MD ELECTRONICALLY SIGNED 04/08/2012 6:14

## 2015-02-07 LAB — SURGICAL PATHOLOGY

## 2015-02-08 ENCOUNTER — Other Ambulatory Visit: Payer: Self-pay | Admitting: Oncology

## 2015-02-09 ENCOUNTER — Other Ambulatory Visit: Payer: Self-pay

## 2015-02-09 DIAGNOSIS — C3411 Malignant neoplasm of upper lobe, right bronchus or lung: Secondary | ICD-10-CM

## 2015-02-14 ENCOUNTER — Inpatient Hospital Stay: Payer: Federal, State, Local not specified - PPO | Admitting: *Deleted

## 2015-02-14 ENCOUNTER — Inpatient Hospital Stay (HOSPITAL_BASED_OUTPATIENT_CLINIC_OR_DEPARTMENT_OTHER): Payer: Federal, State, Local not specified - PPO | Admitting: Oncology

## 2015-02-14 ENCOUNTER — Other Ambulatory Visit: Payer: Self-pay | Admitting: *Deleted

## 2015-02-14 ENCOUNTER — Inpatient Hospital Stay: Payer: Federal, State, Local not specified - PPO | Attending: Oncology | Admitting: *Deleted

## 2015-02-14 VITALS — BP 157/77 | HR 107 | Temp 98.3°F | Wt 160.1 lb

## 2015-02-14 DIAGNOSIS — Z79899 Other long term (current) drug therapy: Secondary | ICD-10-CM | POA: Insufficient documentation

## 2015-02-14 DIAGNOSIS — I10 Essential (primary) hypertension: Secondary | ICD-10-CM

## 2015-02-14 DIAGNOSIS — E119 Type 2 diabetes mellitus without complications: Secondary | ICD-10-CM | POA: Diagnosis not present

## 2015-02-14 DIAGNOSIS — C649 Malignant neoplasm of unspecified kidney, except renal pelvis: Secondary | ICD-10-CM | POA: Diagnosis not present

## 2015-02-14 DIAGNOSIS — C3411 Malignant neoplasm of upper lobe, right bronchus or lung: Secondary | ICD-10-CM | POA: Diagnosis not present

## 2015-02-14 DIAGNOSIS — Z9981 Dependence on supplemental oxygen: Secondary | ICD-10-CM | POA: Insufficient documentation

## 2015-02-14 DIAGNOSIS — C9110 Chronic lymphocytic leukemia of B-cell type not having achieved remission: Secondary | ICD-10-CM

## 2015-02-14 DIAGNOSIS — Z5111 Encounter for antineoplastic chemotherapy: Secondary | ICD-10-CM | POA: Insufficient documentation

## 2015-02-14 DIAGNOSIS — J449 Chronic obstructive pulmonary disease, unspecified: Secondary | ICD-10-CM

## 2015-02-14 DIAGNOSIS — Z8673 Personal history of transient ischemic attack (TIA), and cerebral infarction without residual deficits: Secondary | ICD-10-CM | POA: Insufficient documentation

## 2015-02-14 DIAGNOSIS — Z87891 Personal history of nicotine dependence: Secondary | ICD-10-CM | POA: Diagnosis present

## 2015-02-14 DIAGNOSIS — C3492 Malignant neoplasm of unspecified part of left bronchus or lung: Secondary | ICD-10-CM

## 2015-02-14 LAB — COMPREHENSIVE METABOLIC PANEL
ALT: 14 U/L — AB (ref 17–63)
AST: 17 U/L (ref 15–41)
Albumin: 3.8 g/dL (ref 3.5–5.0)
Alkaline Phosphatase: 60 U/L (ref 38–126)
Anion gap: 4 — ABNORMAL LOW (ref 5–15)
BILIRUBIN TOTAL: 0.5 mg/dL (ref 0.3–1.2)
BUN: 27 mg/dL — AB (ref 6–20)
CO2: 35 mmol/L — ABNORMAL HIGH (ref 22–32)
CREATININE: 1.05 mg/dL (ref 0.61–1.24)
Calcium: 8.9 mg/dL (ref 8.9–10.3)
Chloride: 99 mmol/L — ABNORMAL LOW (ref 101–111)
GFR calc non Af Amer: >60 mL/min (ref >60)
Glucose, Bld: 255 mg/dL — ABNORMAL HIGH (ref 65–99)
Potassium: 3.8 mmol/L (ref 3.5–5.1)
SODIUM: 138 mmol/L (ref 135–145)
Total Protein: 6.9 g/dL (ref 6.5–8.1)

## 2015-02-14 LAB — CBC WITH DIFFERENTIAL/PLATELET
BASOS ABS: 0 10*3/uL (ref 0–0.1)
Basophils Relative: 0 %
Eosinophils Absolute: 0.2 10*3/uL (ref 0–0.7)
HCT: 33.2 % — ABNORMAL LOW (ref 40.0–52.0)
Hemoglobin: 10.6 g/dL — ABNORMAL LOW (ref 13.0–18.0)
Lymphocytes Relative: 21 %
Lymphs Abs: 1.7 10*3/uL (ref 1.0–3.6)
MCH: 27.2 pg (ref 26.0–34.0)
MCHC: 31.8 g/dL — AB (ref 32.0–36.0)
MCV: 85.5 fL (ref 80.0–100.0)
MONO ABS: 0.5 10*3/uL (ref 0.2–1.0)
Monocytes Relative: 7 %
NEUTROS ABS: 5.7 10*3/uL (ref 1.4–6.5)
Neutrophils Relative %: 70 %
PLATELETS: 144 10*3/uL — AB (ref 150–440)
RBC: 3.88 MIL/uL — AB (ref 4.40–5.90)
RDW: 17.7 % — AB (ref 11.5–14.5)
WBC: 8.2 10*3/uL (ref 3.8–10.6)

## 2015-02-14 MED ORDER — SODIUM CHLORIDE 0.9 % IV SOLN
Freq: Once | INTRAVENOUS | Status: AC
  Administered 2015-02-14: 12:00:00 via INTRAVENOUS
  Filled 2015-02-14: qty 250

## 2015-02-14 MED ORDER — HYDROCODONE-ACETAMINOPHEN 5-325 MG PO TABS: 1.0000 | ORAL_TABLET | Freq: Four times a day (QID) | ORAL | Status: DC | PRN

## 2015-02-14 MED ORDER — FAMOTIDINE IN NACL 20-0.9 MG/50ML-% IV SOLN
20.0000 mg | Freq: Once | INTRAVENOUS | Status: AC | PRN
  Administered 2015-02-14: 20 mg via INTRAVENOUS
  Filled 2015-02-14: qty 50

## 2015-02-14 MED ORDER — HEPARIN SOD (PORK) LOCK FLUSH 100 UNIT/ML IV SOLN
500.0000 [IU] | Freq: Once | INTRAVENOUS | Status: AC | PRN
  Administered 2015-02-14: 500 [IU]
  Filled 2015-02-14: qty 5

## 2015-02-14 MED ORDER — DIPHENHYDRAMINE HCL 50 MG/ML IJ SOLN
25.0000 mg | Freq: Once | INTRAMUSCULAR | Status: AC | PRN
  Administered 2015-02-14: 25 mg via INTRAVENOUS
  Filled 2015-02-14: qty 1

## 2015-02-14 MED ORDER — SODIUM CHLORIDE 0.9 % IV SOLN
200.0000 mg | Freq: Once | INTRAVENOUS | Status: AC
  Administered 2015-02-14: 200 mg via INTRAVENOUS
  Filled 2015-02-14: qty 20

## 2015-02-26 ENCOUNTER — Encounter: Payer: Self-pay | Admitting: Oncology

## 2015-02-26 NOTE — Progress Notes (Signed)
[name] @ [facility] [phone]  [phone]     Craig [name]. OB: 12-17-1950  MR#: [id number]  NTI#:[id number]  Patient Care Team: Craig Pitch, MD as PCP - General (Family Medicine)  CHIEF COMPLAINT:  Chief Complaint  Patient presents with  . Follow-up    Lung cancer    Oncology History   1. Right upper lobe lung mass biopsies positive for adenocarcinoma.   Based on PET scan patient has T2, N2, M0 tumor stage III a 2. Poor pulmonary function 3. Patient was started on carboplatinum Alimta and Avastin   on april  9th of 2013 4. Patient had an allergic reaction to carboplatinum with acute bronchospasm in May of 2013.  Patient is now being taken off carboplatinum. 5. Started on maintenance Alimta and Avastin  from June of 2013 hold chemotherapy because of increasing shortness of breath (in July, 2013) 6.VARISTRAT test is good started on Tarceva February 3 about 2014 diarrhea 4 days after Tarceva was started.  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 9.bnormal CT and MRI scan of the right kidneywith solid massin the upper pole right kidney 10.biopsy of renal masses consistent with primary renal cell cancer 11.  Patient started on  [facility] August 09, 2014     Lung cancer, upper lobe   02/03/2015 Initial Diagnosis Lung cancer, upper lobe    Oncology Flowsheet 02/14/2015  nivolumab (OPDIVO) IV 200 mg    INTERVAL HISTORY: 64 year old gentleman with multiple malignancy presently being treated for recurrent carcinoma of lung.  On oxygen.  No diarrhea.  No rash.  Patient has been gradually improving.  Shortness of breath is improved.  No nausea or vomiting.  Appetite is improved.  Here for further follow-up and treatment consideration  REVIEW OF SYSTEMS:   Chest status: Improved performance status.  Lungs: Shortness of  breath on exertion dry hacking cough no hemoptysis HEENT: No difficulty swallowing.  No soreness in the mouth cardiac: No chest pain.  GI: No nausea no vomiting no diarrhea.  GU: No dysuria or hematuria.  Skin: No rash.  Neurological system no headache no dizziness.  Lower extremity no swelling.  Musculoskeletal system within normal limit.  As per HPI. Otherwise, a complete review of systems is negatve.  PAST MEDICAL HISTORY: Past Medical History  Diagnosis Date  . Lung cancer     right upper lobe mass positive for adenocarcinoma  . Cancer   . Renal cancer   . Lymphoma     low grade  . COPD (chronic obstructive pulmonary disease)   . Stroke   . Hypertension   . Diabetes mellitus without complication   . Respiratory failure 07/12/2010    acute  . Colon cancer   . Tuberculosis   . Gout   . Glaucoma   . Pancreatitis   . Cholecystitis     PAST SURGICAL HIST0RY   COPD:    acute resp failure: 12-Jul-2010   lung ca:    CVA/Stroke:    HTN:    Diabetes Mellitus, Type II (NIDD):   Smoking History: Smoking History 3(1)quit Sept. 2011(1)  PFSH: Comments: history of colon cancer, doubt, diabetes, tuberculosis  Comments: has been a chronic smoker for more than 25 years pack a day history does not drink.  Works in the post [employer]  Additional Past Medical and Surgical History: diabetes, type II,.  Hypertensin, history  of stroke, chronic obstructive pulmonary disease,.  glaucoma.  History of pancreatitis and cholecystitis       ADVANCED DIRECTIVES: Patient does have advanced care directive   HEALTH MAINTENANCE: History  Substance Use Topics  . Smoking status: Former Smoker -- 1.00 packs/day for 25 years    Quit date: 01/13/2010  . Smokeless tobacco: Not on file  . Alcohol Use: Not on file     Colonoscopy:  PAP:  Bone density:  Lipid panel:  Allergies  Allergen Reactions  . Iodine Shortness Of Breath, Diarrhea and Nausea And Vomiting  . Nexium [Esomeprazole  Magnesium] Other (See Comments)    headache    Current Outpatient Prescriptions  Medication Sig Dispense Refill  . albuterol (PROVENTIL HFA;VENTOLIN HFA) 108 (90 BASE) MCG/ACT inhaler Inhale 2 puffs into the lungs every 6 (six) hours as needed for wheezing or shortness of breath.    . budesonide-formoterol (SYMBICORT) 160-4.5 MCG/ACT inhaler Inhale 2 puffs into the lungs 2 (two) times daily.    . diphenhydrAMINE (BENADRYL) 12.5 MG chewable tablet Chew 25 mg by mouth daily as needed for allergies.    Marland Kitchen glipiZIDE (GLUCOTROL) 10 MG tablet Take 10 mg by mouth daily before breakfast.    . HYDROcodone-acetaminophen (NORCO/VICODIN) 5-325 MG per tablet Take 1-2 tablets by mouth every 6 (six) hours as needed for moderate pain. 30 tablet 0  . ipratropium-albuterol (DUONEB) 0.5-2.5 (3) MG/3ML SOLN Take 3 mLs by nebulization every 6 (six) hours as needed (shortness of breath).    . loperamide (IMODIUM A-D) 2 MG tablet Take 2 mg by mouth 4 (four) times daily as needed for diarrhea or loose stools.    Marland Kitchen losartan-hydrochlorothiazide (HYZAAR) 50-12.5 MG per tablet Take 1 tablet by mouth daily.    . pioglitazone (ACTOS) 30 MG tablet Take 30 mg by mouth daily.    Marland Kitchen tiotropium (SPIRIVA) 18 MCG inhalation capsule Place 18 mcg into inhaler and inhale daily.    . Travoprost, BAK Free, (TRAVATAN) 0.004 % SOLN ophthalmic solution 1 drop at bedtime.    Marland Kitchen zolpidem (AMBIEN) 10 MG tablet Take 10 mg by mouth at bedtime as needed for sleep.     No current facility-administered medications for this visit.    OBJECTIVE:  Filed Vitals:   02/14/15 1019  BP: 157/77  Pulse: 107  Temp: 98.3 F (36.8 C)     Body mass index is 24.34 kg/(m^2).    ECOG FS:1 - Symptomatic but completely ambulatory  PHYSICAL EXAM:  Gen. status: Patient is alert oriented not any acute distress using oxygen. HEENT: No evidence of stomatitis. Lymphatic system: No palpable supraclavicular cervical axillary lymphadenopathy Lungs: Diminished at  [facility] both sides O rhonchi coarse crepitation Cardiac: Soft systolic murmur.  Tachycardia Abdominal exam revealed normal bowel sounds. The abdomen was soft, non-tender, and without masses, organomegaly, or appreciable enlargement of the abdominal aorta. Skin: No ecchymosis no rash Neurological system no localizing sign All other systems has been examined no abnormality detected  LAB RESULTS:  Appointment on 02/14/2015  Component Date Value Ref Range Status  . WBC 02/14/2015 8.2  3.8 - 10.6 K/uL Final  . RBC 02/14/2015 3.88* 4.40 - 5.90 MIL/uL Final  . Hemoglobin 02/14/2015 10.6* 13.0 - 18.0 g/dL Final  . HCT 02/14/2015 33.2* 40.0 - 52.0 % Final  . MCV 02/14/2015 85.5  80.0 - 100.0 fL Final  . MCH 02/14/2015 27.2  26.0 - 34.0 pg Final  . MCHC 02/14/2015 31.8* 32.0 - 36.0 g/dL Final  . RDW 02/14/2015  17.7* 11.5 - 14.5 % Final  . Platelets 02/14/2015 144* 150 - 440 K/uL Final  . Neutrophils Relative % 02/14/2015 70%   Final  . Neutro Abs 02/14/2015 5.7  1.4 - 6.5 K/uL Final  . Lymphocytes Relative 02/14/2015 21%   Final  . Lymphs Abs 02/14/2015 1.7  1.0 - 3.6 K/uL Final  . Monocytes Relative 02/14/2015 7%   Final  . Monocytes Absolute 02/14/2015 0.5  0.2 - 1.0 K/uL Final  . Eosinophils Relative 02/14/2015 2%   Final  . Eosinophils Absolute 02/14/2015 0.2  0 - 0.7 K/uL Final  . Basophils Relative 02/14/2015 0%   Final  . Basophils Absolute 02/14/2015 0.0  0 - 0.1 K/uL Final  . Sodium 02/14/2015 138  135 - 145 mmol/L Final  . Potassium 02/14/2015 3.8  3.5 - 5.1 mmol/L Final  . Chloride 02/14/2015 99* 101 - 111 mmol/L Final  . CO2 02/14/2015 35* 22 - 32 mmol/L Final  . Glucose, Bld 02/14/2015 255* 65 - 99 mg/dL Final  . BUN 02/14/2015 27* 6 - 20 mg/dL Final  . Creatinine, Ser 02/14/2015 1.05  0.61 - 1.24 mg/dL Final  . Calcium 02/14/2015 8.9  8.9 - 10.3 mg/dL Final  . Total Protein 02/14/2015 6.9  6.5 - 8.1 g/dL Final  . Albumin 02/14/2015 3.8  3.5 - 5.0 g/dL Final  . AST  02/14/2015 17  15 - 41 U/L Final  . ALT 02/14/2015 14* 17 - 63 U/L Final  . Alkaline Phosphatase 02/14/2015 60  38 - 126 U/L Final  . Total Bilirubin 02/14/2015 0.5  0.3 - 1.2 mg/dL Final  . GFR calc non Af Amer 02/14/2015 >60  >60 mL/min Final  . GFR calc Af Amer 02/14/2015 >60  >60 mL/min Final   Comment: (NOTE) The eGFR has been calculated using the CKD EPI equation. This calculation has not been validated in all clinical situations. eGFR's persistently <90 mL/min signify possible Chronic Kidney Disease.   . Anion gap 02/14/2015 4* 5 - 15 Final      STUDIES: No results found.  ASSESSMENT: CARCINOMA of lung stage IV disease on NIVOLULAMAB stable disease based on CT scan and a PET scan Carcinoma of kidney Retroperitoneal lymphadenopathy low-grade lymphoma  COPD MEDICAL DECISION MAKING:  All lab data has been reviewed.  Patient does not have a significant side effect on NIVOLULAMAB.  Based on x-ray patient is responding to the treatment will continue NIVOLULAMAB reevaluate patient in one month   Patient expressed understanding and was in agreement with this plan. He also understands that He can call clinic at any time with any questions, concerns, or complaints.    Lung cancer, upper lobe   Staging form: Lung, AJCC 7th Edition     Clinical stage from 06/15/2010: yT2, N2, M0 - Signed by [name], NP on 02/03/2015     Pathologic stage from 04/04/2014: yT2, N2, M1 - Signed by [name], NP on 02/03/2015 Lymphoma, small lymphocytic   Staging form: Lymphoid Neoplasms, AJCC 6th Edition     Clinical stage from 10/15/2013: Stage II - Signed by [name], NP on 02/03/2015 Renal cell cancer   Staging form: Kidney, AJCC 7th Edition     Clinical stage from 02/03/2015: Stage I (yT1a, N0, M0) - Signed by [name], NP on 02/03/2015   Forest Gleason, MD   02/26/2015 3:40 PM

## 2015-02-28 ENCOUNTER — Emergency Department: Payer: Federal, State, Local not specified - PPO

## 2015-02-28 ENCOUNTER — Encounter: Payer: Self-pay | Admitting: General Practice

## 2015-02-28 ENCOUNTER — Inpatient Hospital Stay: Payer: Federal, State, Local not specified - PPO

## 2015-02-28 ENCOUNTER — Emergency Department
Admission: EM | Admit: 2015-02-28 | Discharge: 2015-02-28 | Discharge status: Against Medical Advice | Payer: Federal, State, Local not specified - PPO | Attending: Emergency Medicine | Admitting: Emergency Medicine

## 2015-02-28 DIAGNOSIS — Z87891 Personal history of nicotine dependence: Secondary | ICD-10-CM | POA: Insufficient documentation

## 2015-02-28 DIAGNOSIS — E119 Type 2 diabetes mellitus without complications: Secondary | ICD-10-CM | POA: Insufficient documentation

## 2015-02-28 DIAGNOSIS — C3490 Malignant neoplasm of unspecified part of unspecified bronchus or lung: Secondary | ICD-10-CM | POA: Insufficient documentation

## 2015-02-28 DIAGNOSIS — C3411 Malignant neoplasm of upper lobe, right bronchus or lung: Secondary | ICD-10-CM | POA: Diagnosis not present

## 2015-02-28 DIAGNOSIS — R0602 Shortness of breath: Secondary | ICD-10-CM | POA: Insufficient documentation

## 2015-02-28 DIAGNOSIS — I10 Essential (primary) hypertension: Secondary | ICD-10-CM | POA: Diagnosis not present

## 2015-02-28 LAB — BASIC METABOLIC PANEL
Anion gap: 7 (ref 5–15)
BUN: 15 mg/dL (ref 6–20)
CALCIUM: 9.5 mg/dL (ref 8.9–10.3)
CO2: 35 mmol/L — ABNORMAL HIGH (ref 22–32)
Chloride: 101 mmol/L (ref 101–111)
Creatinine, Ser: 1.08 mg/dL (ref 0.61–1.24)
GFR calc Af Amer: >60 mL/min (ref >60)
GLUCOSE: 125 mg/dL — AB (ref 65–99)
POTASSIUM: 3.6 mmol/L (ref 3.5–5.1)
Sodium: 143 mmol/L (ref 135–145)

## 2015-02-28 LAB — COMPREHENSIVE METABOLIC PANEL
ALBUMIN: 3.8 g/dL (ref 3.5–5.0)
ALT: 13 U/L — AB (ref 17–63)
AST: 16 U/L (ref 15–41)
Alkaline Phosphatase: 68 U/L (ref 38–126)
Anion gap: 5 (ref 5–15)
BILIRUBIN TOTAL: 0.4 mg/dL (ref 0.3–1.2)
BUN: 18 mg/dL (ref 6–20)
CALCIUM: 8.8 mg/dL — AB (ref 8.9–10.3)
CO2: 35 mmol/L — ABNORMAL HIGH (ref 22–32)
Chloride: 99 mmol/L — ABNORMAL LOW (ref 101–111)
Creatinine, Ser: 1.23 mg/dL (ref 0.61–1.24)
GFR calc Af Amer: >60 mL/min (ref >60)
Glucose, Bld: 124 mg/dL — ABNORMAL HIGH (ref 65–99)
Potassium: 3.5 mmol/L (ref 3.5–5.1)
Sodium: 139 mmol/L (ref 135–145)
TOTAL PROTEIN: 6.7 g/dL (ref 6.5–8.1)

## 2015-02-28 LAB — CBC WITH DIFFERENTIAL/PLATELET
Basophils Absolute: 0 10*3/uL (ref 0–0.1)
Basophils Relative: 1 %
EOS PCT: 7 %
Eosinophils Absolute: 0.4 10*3/uL (ref 0–0.7)
HCT: 34.4 % — ABNORMAL LOW (ref 40.0–52.0)
HEMOGLOBIN: 10.8 g/dL — AB (ref 13.0–18.0)
LYMPHS ABS: 1.8 10*3/uL (ref 1.0–3.6)
Lymphocytes Relative: 33 %
MCH: 26.7 pg (ref 26.0–34.0)
MCHC: 31.4 g/dL — ABNORMAL LOW (ref 32.0–36.0)
MCV: 85.1 fL (ref 80.0–100.0)
MONOS PCT: 8 %
Monocytes Absolute: 0.4 10*3/uL (ref 0.2–1.0)
Neutro Abs: 2.7 10*3/uL (ref 1.4–6.5)
Neutrophils Relative %: 51 %
Platelets: 183 10*3/uL (ref 150–440)
RBC: 4.04 MIL/uL — AB (ref 4.40–5.90)
RDW: 16.8 % — ABNORMAL HIGH (ref 11.5–14.5)
WBC: 5.3 10*3/uL (ref 3.8–10.6)

## 2015-02-28 LAB — CBC
HCT: 35.4 % — ABNORMAL LOW (ref 40.0–52.0)
Hemoglobin: 11.2 g/dL — ABNORMAL LOW (ref 13.0–18.0)
MCH: 27.2 pg (ref 26.0–34.0)
MCHC: 31.6 g/dL — ABNORMAL LOW (ref 32.0–36.0)
MCV: 86.2 fL (ref 80.0–100.0)
Platelets: 193 10*3/uL (ref 150–440)
RBC: 4.11 MIL/uL — ABNORMAL LOW (ref 4.40–5.90)
RDW: 16.6 % — ABNORMAL HIGH (ref 11.5–14.5)
WBC: 7.1 10*3/uL (ref 3.8–10.6)

## 2015-02-28 LAB — TROPONIN I

## 2015-02-28 MED ORDER — SODIUM CHLORIDE 0.9 % IV SOLN
200.0000 mg | Freq: Once | INTRAVENOUS | Status: AC
  Administered 2015-02-28: 200 mg via INTRAVENOUS
  Filled 2015-02-28: qty 20

## 2015-02-28 MED ORDER — SODIUM CHLORIDE 0.9 % IV SOLN
Freq: Once | INTRAVENOUS | Status: AC
  Administered 2015-02-28: 16:00:00 via INTRAVENOUS
  Filled 2015-02-28: qty 250

## 2015-02-28 MED ORDER — SODIUM CHLORIDE 0.9 % IJ SOLN
10.0000 mL | INTRAMUSCULAR | Status: DC | PRN
  Administered 2015-02-28: 10 mL
  Filled 2015-02-28: qty 10

## 2015-02-28 MED ORDER — HEPARIN SOD (PORK) LOCK FLUSH 100 UNIT/ML IV SOLN
500.0000 [IU] | Freq: Once | INTRAVENOUS | Status: AC | PRN
  Administered 2015-02-28: 500 [IU]
  Filled 2015-02-28: qty 5

## 2015-02-28 NOTE — ED Notes (Signed)
Pt. Arrived to ed from Cancer center due to SOB. Pt reports he was receiving chemo and started experiencing SOB. Pt. Reports he currently has Lung CA.  Wears 2L home oxygen. Pt reports "i dont usually feel that short of breath".  Pt alert and oriented. Denies Chest Pain at this time.

## 2015-03-02 ENCOUNTER — Telehealth: Payer: Self-pay | Admitting: Emergency Medicine

## 2015-03-02 ENCOUNTER — Telehealth: Payer: Self-pay | Admitting: *Deleted

## 2015-03-02 DIAGNOSIS — G4700 Insomnia, unspecified: Secondary | ICD-10-CM | POA: Insufficient documentation

## 2015-03-02 DIAGNOSIS — E119 Type 2 diabetes mellitus without complications: Secondary | ICD-10-CM | POA: Insufficient documentation

## 2015-03-02 DIAGNOSIS — I10 Essential (primary) hypertension: Secondary | ICD-10-CM | POA: Insufficient documentation

## 2015-03-02 NOTE — Telephone Encounter (Signed)
Patient had labs and xray done when sent to ER on 02/28/15, but left before being evaluated by MD.

## 2015-03-15 ENCOUNTER — Telehealth: Payer: Self-pay | Admitting: *Deleted

## 2015-03-15 DIAGNOSIS — C3492 Malignant neoplasm of unspecified part of left bronchus or lung: Secondary | ICD-10-CM

## 2015-03-15 MED ORDER — HYDROCODONE-ACETAMINOPHEN 5-325 MG PO TABS: 1.0000 | ORAL_TABLET | Freq: Four times a day (QID) | ORAL | Status: DC | PRN

## 2015-03-15 NOTE — Telephone Encounter (Signed)
Informed that prescription is ready to pick up  

## 2015-03-16 ENCOUNTER — Other Ambulatory Visit: Payer: Self-pay | Admitting: Oncology

## 2015-03-21 ENCOUNTER — Other Ambulatory Visit: Payer: Self-pay | Admitting: Oncology

## 2015-03-22 ENCOUNTER — Other Ambulatory Visit: Payer: Self-pay | Admitting: Oncology

## 2015-03-22 ENCOUNTER — Other Ambulatory Visit: Payer: Self-pay | Admitting: *Deleted

## 2015-03-22 DIAGNOSIS — C3411 Malignant neoplasm of upper lobe, right bronchus or lung: Secondary | ICD-10-CM

## 2015-03-24 ENCOUNTER — Inpatient Hospital Stay: Payer: Federal, State, Local not specified - PPO | Attending: Oncology

## 2015-03-24 VITALS — BP 127/69 | HR 108 | Temp 96.7°F | Resp 20

## 2015-03-24 DIAGNOSIS — Z7952 Long term (current) use of systemic steroids: Secondary | ICD-10-CM | POA: Diagnosis not present

## 2015-03-24 DIAGNOSIS — C649 Malignant neoplasm of unspecified kidney, except renal pelvis: Secondary | ICD-10-CM | POA: Insufficient documentation

## 2015-03-24 DIAGNOSIS — Z85038 Personal history of other malignant neoplasm of large intestine: Secondary | ICD-10-CM | POA: Diagnosis not present

## 2015-03-24 DIAGNOSIS — Z87891 Personal history of nicotine dependence: Secondary | ICD-10-CM | POA: Insufficient documentation

## 2015-03-24 DIAGNOSIS — J449 Chronic obstructive pulmonary disease, unspecified: Secondary | ICD-10-CM | POA: Insufficient documentation

## 2015-03-24 DIAGNOSIS — Z79899 Other long term (current) drug therapy: Secondary | ICD-10-CM | POA: Diagnosis not present

## 2015-03-24 DIAGNOSIS — C3411 Malignant neoplasm of upper lobe, right bronchus or lung: Secondary | ICD-10-CM

## 2015-03-24 DIAGNOSIS — E119 Type 2 diabetes mellitus without complications: Secondary | ICD-10-CM | POA: Insufficient documentation

## 2015-03-24 DIAGNOSIS — C9110 Chronic lymphocytic leukemia of B-cell type not having achieved remission: Secondary | ICD-10-CM | POA: Insufficient documentation

## 2015-03-24 DIAGNOSIS — Z8673 Personal history of transient ischemic attack (TIA), and cerebral infarction without residual deficits: Secondary | ICD-10-CM | POA: Diagnosis not present

## 2015-03-24 DIAGNOSIS — C3492 Malignant neoplasm of unspecified part of left bronchus or lung: Secondary | ICD-10-CM

## 2015-03-24 DIAGNOSIS — I10 Essential (primary) hypertension: Secondary | ICD-10-CM | POA: Insufficient documentation

## 2015-03-24 DIAGNOSIS — Z5111 Encounter for antineoplastic chemotherapy: Secondary | ICD-10-CM | POA: Insufficient documentation

## 2015-03-24 MED ORDER — SODIUM CHLORIDE 0.9 % IV SOLN
Freq: Once | INTRAVENOUS | Status: AC
  Administered 2015-03-24: 15:00:00 via INTRAVENOUS
  Filled 2015-03-24: qty 1000

## 2015-03-24 MED ORDER — SODIUM CHLORIDE 0.9 % IJ SOLN
10.0000 mL | INTRAMUSCULAR | Status: DC | PRN
  Administered 2015-03-24: 10 mL via INTRAVENOUS
  Filled 2015-03-24: qty 10

## 2015-03-24 MED ORDER — HYDROCODONE-ACETAMINOPHEN 5-325 MG PO TABS: 1.0000 | ORAL_TABLET | Freq: Four times a day (QID) | ORAL | Status: DC | PRN

## 2015-03-24 MED ORDER — SODIUM CHLORIDE 0.9 % IV SOLN
200.0000 mg | Freq: Once | INTRAVENOUS | Status: AC
  Administered 2015-03-24: 200 mg via INTRAVENOUS
  Filled 2015-03-24: qty 20

## 2015-03-24 MED ORDER — HEPARIN SOD (PORK) LOCK FLUSH 100 UNIT/ML IV SOLN
500.0000 [IU] | Freq: Once | INTRAVENOUS | Status: AC
  Administered 2015-03-24: 500 [IU] via INTRAVENOUS
  Filled 2015-03-24: qty 5

## 2015-04-04 ENCOUNTER — Other Ambulatory Visit: Payer: Self-pay | Admitting: *Deleted

## 2015-04-04 DIAGNOSIS — C3492 Malignant neoplasm of unspecified part of left bronchus or lung: Secondary | ICD-10-CM

## 2015-04-04 MED ORDER — HYDROCODONE-ACETAMINOPHEN 5-325 MG PO TABS: 1.0000 | ORAL_TABLET | Freq: Four times a day (QID) | ORAL | Status: DC | PRN

## 2015-04-04 NOTE — Telephone Encounter (Signed)
Informed that prescription is ready to pick up  

## 2015-04-07 ENCOUNTER — Inpatient Hospital Stay: Payer: Federal, State, Local not specified - PPO | Admitting: Oncology

## 2015-04-07 ENCOUNTER — Inpatient Hospital Stay: Payer: Federal, State, Local not specified - PPO

## 2015-04-14 ENCOUNTER — Inpatient Hospital Stay: Payer: Federal, State, Local not specified - PPO

## 2015-04-14 ENCOUNTER — Ambulatory Visit
Admission: RE | Admit: 2015-04-14 | Discharge: 2015-04-14 | Disposition: A | Discharge status: Home / Self Care | Payer: Federal, State, Local not specified - PPO | Source: Ambulatory Visit | Attending: Oncology | Admitting: Oncology

## 2015-04-14 ENCOUNTER — Inpatient Hospital Stay (HOSPITAL_BASED_OUTPATIENT_CLINIC_OR_DEPARTMENT_OTHER): Payer: Federal, State, Local not specified - PPO | Admitting: Oncology

## 2015-04-14 VITALS — BP 131/86 | HR 77 | Temp 96.8°F | Wt 149.5 lb

## 2015-04-14 DIAGNOSIS — Z8673 Personal history of transient ischemic attack (TIA), and cerebral infarction without residual deficits: Secondary | ICD-10-CM

## 2015-04-14 DIAGNOSIS — C9110 Chronic lymphocytic leukemia of B-cell type not having achieved remission: Secondary | ICD-10-CM

## 2015-04-14 DIAGNOSIS — Z79899 Other long term (current) drug therapy: Secondary | ICD-10-CM

## 2015-04-14 DIAGNOSIS — C3411 Malignant neoplasm of upper lobe, right bronchus or lung: Secondary | ICD-10-CM

## 2015-04-14 DIAGNOSIS — C649 Malignant neoplasm of unspecified kidney, except renal pelvis: Secondary | ICD-10-CM

## 2015-04-14 DIAGNOSIS — Z08 Encounter for follow-up examination after completed treatment for malignant neoplasm: Secondary | ICD-10-CM | POA: Diagnosis present

## 2015-04-14 DIAGNOSIS — J449 Chronic obstructive pulmonary disease, unspecified: Secondary | ICD-10-CM

## 2015-04-14 DIAGNOSIS — Z87891 Personal history of nicotine dependence: Secondary | ICD-10-CM

## 2015-04-14 DIAGNOSIS — Z85038 Personal history of other malignant neoplasm of large intestine: Secondary | ICD-10-CM

## 2015-04-14 DIAGNOSIS — C3492 Malignant neoplasm of unspecified part of left bronchus or lung: Secondary | ICD-10-CM

## 2015-04-14 DIAGNOSIS — E119 Type 2 diabetes mellitus without complications: Secondary | ICD-10-CM

## 2015-04-14 DIAGNOSIS — I10 Essential (primary) hypertension: Secondary | ICD-10-CM

## 2015-04-14 DIAGNOSIS — Z7952 Long term (current) use of systemic steroids: Secondary | ICD-10-CM

## 2015-04-14 LAB — CBC WITH DIFFERENTIAL/PLATELET
Basophils Absolute: 0 10*3/uL (ref 0–0.1)
Basophils Relative: 0 %
Eosinophils Absolute: 0.3 10*3/uL (ref 0–0.7)
Eosinophils Relative: 4 %
HCT: 34.5 % — ABNORMAL LOW (ref 40.0–52.0)
Hemoglobin: 11 g/dL — ABNORMAL LOW (ref 13.0–18.0)
Lymphocytes Relative: 28 %
Lymphs Abs: 2.1 10*3/uL (ref 1.0–3.6)
MCH: 27.5 pg (ref 26.0–34.0)
MCHC: 31.9 g/dL — AB (ref 32.0–36.0)
MCV: 86.3 fL (ref 80.0–100.0)
MONO ABS: 0.9 10*3/uL (ref 0.2–1.0)
MONOS PCT: 12 %
Neutro Abs: 4.2 10*3/uL (ref 1.4–6.5)
Neutrophils Relative %: 56 %
Platelets: 146 10*3/uL — ABNORMAL LOW (ref 150–440)
RBC: 4 MIL/uL — ABNORMAL LOW (ref 4.40–5.90)
RDW: 16 % — ABNORMAL HIGH (ref 11.5–14.5)
WBC: 7.6 10*3/uL (ref 3.8–10.6)

## 2015-04-14 LAB — COMPREHENSIVE METABOLIC PANEL
ALBUMIN: 4.1 g/dL (ref 3.5–5.0)
ALK PHOS: 64 U/L (ref 38–126)
ALT: 12 U/L — AB (ref 17–63)
AST: 17 U/L (ref 15–41)
Anion gap: 7 (ref 5–15)
BUN: 25 mg/dL — ABNORMAL HIGH (ref 6–20)
CO2: 33 mmol/L — ABNORMAL HIGH (ref 22–32)
Calcium: 8.8 mg/dL — ABNORMAL LOW (ref 8.9–10.3)
Chloride: 98 mmol/L — ABNORMAL LOW (ref 101–111)
Creatinine, Ser: 1.22 mg/dL (ref 0.61–1.24)
GFR calc Af Amer: >60 mL/min (ref >60)
GFR calc non Af Amer: >60 mL/min (ref >60)
Glucose, Bld: 150 mg/dL — ABNORMAL HIGH (ref 65–99)
POTASSIUM: 3.8 mmol/L (ref 3.5–5.1)
Sodium: 138 mmol/L (ref 135–145)
TOTAL PROTEIN: 7.3 g/dL (ref 6.5–8.1)
Total Bilirubin: 0.5 mg/dL (ref 0.3–1.2)

## 2015-04-14 LAB — TSH: TSH: 1.038 u[IU]/mL (ref 0.350–4.500)

## 2015-04-14 LAB — MAGNESIUM: MAGNESIUM: 1.5 mg/dL — AB (ref 1.7–2.4)

## 2015-04-14 MED ORDER — SODIUM CHLORIDE 0.9 % IV SOLN
Freq: Once | INTRAVENOUS | Status: AC
  Administered 2015-04-14: 15:00:00 via INTRAVENOUS
  Filled 2015-04-14: qty 1000

## 2015-04-14 MED ORDER — HEPARIN SOD (PORK) LOCK FLUSH 100 UNIT/ML IV SOLN
500.0000 [IU] | Freq: Once | INTRAVENOUS | Status: AC | PRN
  Administered 2015-04-14: 500 [IU]
  Filled 2015-04-14: qty 5

## 2015-04-14 MED ORDER — SODIUM CHLORIDE 0.9 % IV SOLN
200.0000 mg | Freq: Once | INTRAVENOUS | Status: AC
  Administered 2015-04-14: 200 mg via INTRAVENOUS
  Filled 2015-04-14: qty 20

## 2015-04-14 NOTE — Progress Notes (Signed)
Patient does have living will.  Former smoker.

## 2015-04-15 ENCOUNTER — Encounter: Payer: Self-pay | Admitting: Oncology

## 2015-04-15 DIAGNOSIS — H409 Unspecified glaucoma: Secondary | ICD-10-CM | POA: Insufficient documentation

## 2015-04-15 DIAGNOSIS — B029 Zoster without complications: Secondary | ICD-10-CM | POA: Insufficient documentation

## 2015-04-15 LAB — T4: T4, Total: 7.5 ug/dL (ref 4.5–12.0)

## 2015-04-15 NOTE — Progress Notes (Signed)
Informed that prescription is ready to pick up  Craig [name] @ [facility] [phone]  [phone] [place]. OB: 11/02/1950  MR#: [id number]  RKY#:[id number]  Patient Care Team: [name], MD as PCP - General (Family Medicine)  CHIEF COMPLAINT:  Chief Complaint  Patient presents with  . Follow-up    Oncology History   1. Right upper lobe lung mass biopsies positive for adenocarcinoma.   Based on PET scan patient has T2, N2, M0 tumor stage III a 2. Poor pulmonary function 3. Patient was started on carboplatinum Alimta and Avastin   on april  9th of 2013 4. Patient had an allergic reaction to carboplatinum with acute bronchospasm in May of 2013.  Patient is now being taken off carboplatinum. 5. Started on maintenance Alimta and Avastin  from June of 2013 hold chemotherapy because of increasing shortness of breath (in July, 2013) 6.VARISTRAT test is good started on Tarceva February 3 about 2014 diarrhea 4 days after Tarceva was started.  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 9.bnormal CT and MRI scan of the right kidneywith solid massin the upper pole right kidney 10.biopsy of renal masses consistent with primary renal cell cancer 11.  Patient started on  Navos August 09, 2014     Lung cancer, upper lobe   02/03/2015 Initial Diagnosis Lung cancer, upper lobe    Oncology Flowsheet 02/14/2015 02/28/2015 03/24/2015 04/14/2015  nivolumab (OPDIVO) IV 200 mg 200 mg 200 mg 200 mg    INTERVAL HISTORY: 64 year old gentleman with multiple malignancy presently being treated for recurrent carcinoma of lung.  On oxygen.  No diarrhea.  No rash.  Patient has been gradually improving.  Shortness of breath is improved.  No nausea or vomiting.  Appetite is improved.  Here for further follow-up and treatment consideration April 14, 2015 Patient is here for ongoing evaluation and continuation of chemotherapy withn ivolulamab.  Tolerating treatment very well.  No diarrhea.  No nausea no vomiting.  Had a chest x-ray which has been reviewed and was stable  REVIEW OF SYSTEMS:   Gen. status: Patient's performance status is stable Lungs: On oxygen no shortness of breath at rest shortness of breath on exertion no cough no hemoptysis Cardiac: No chest pain or paroxysmal nocturnal dyspnea Abdomen: No nausea no vomiting no diarrhea GU: No hematuria dysuria Lower extremity no swelling Skin: No rash Neurological system no headache no dizziness Pain has improved taking Vicodin  As per HPI. Otherwise, a complete review of systems is negatve.  PAST MEDICAL HISTORY: Past Medical History  Diagnosis Date  . Lung cancer     right upper lobe mass positive for adenocarcinoma  . Cancer   . Renal cancer   . Lymphoma     low grade  . COPD (chronic obstructive pulmonary disease)   . Stroke   . Hypertension   . Diabetes mellitus without complication   . Respiratory failure 07/12/2010    acute  . Colon cancer   . Tuberculosis   . Gout   . Glaucoma   . Pancreatitis   . Cholecystitis     PAST SURGICAL HIST0RY   COPD:    acute resp failure: 12-Jul-2010   lung ca:    CVA/Stroke:    HTN:    Diabetes Mellitus, Type II (NIDD):   Smoking History: Smoking History 3(1)quit Sept. 2011(1)  PFSH: Comments: history of colon cancer, doubt, diabetes, tuberculosis  Comments: has been a chronic smoker for more than 25 years pack a day history does not drink.  Works in the [employer]  Additional Past Medical and Surgical History: diabetes, type II,.  Hypertensin, history of stroke, chronic obstructive pulmonary disease,.  glaucoma.  History of pancreatitis and cholecystitis       ADVANCED DIRECTIVES: Patient does have advanced care directive   HEALTH MAINTENANCE: History  Substance Use Topics  . Smoking status: Former  Smoker -- 1.00 packs/day for 25 years    Quit date: 01/13/2010  . Smokeless tobacco: Not on file  . Alcohol Use: No       Allergies  Allergen Reactions  . Iodine Shortness Of Breath, Diarrhea and Nausea And Vomiting  . Nexium [Esomeprazole Magnesium] Other (See Comments)    headache    Current Outpatient Prescriptions  Medication Sig Dispense Refill  . albuterol (PROVENTIL HFA;VENTOLIN HFA) 108 (90 BASE) MCG/ACT inhaler Inhale 2 puffs into the lungs every 6 (six) hours as needed for wheezing or shortness of breath.    Marland Kitchen albuterol (PROVENTIL) (2.5 MG/3ML) 0.083% nebulizer solution USE 1 VIAL IN NEBULIZER EVERY 3-4 HOURS AS NEEDED 75 mL 3  . budesonide-formoterol (SYMBICORT) 160-4.5 MCG/ACT inhaler Inhale 2 puffs into the lungs 2 (two) times daily.    . diphenhydrAMINE (BENADRYL) 12.5 MG chewable tablet Chew 25 mg by mouth daily as needed for allergies.    Marland Kitchen glipiZIDE (GLUCOTROL) 10 MG tablet Take 10 mg by mouth daily before breakfast.    . HYDROcodone-acetaminophen (NORCO/VICODIN) 5-325 MG per tablet Take 1-2 tablets by mouth every 6 (six) hours as needed for moderate pain. 90 tablet 0  . ipratropium-albuterol (DUONEB) 0.5-2.5 (3) MG/3ML SOLN Take 3 mLs by nebulization every 6 (six) hours as needed (shortness of breath).    . loperamide (IMODIUM A-D) 2 MG tablet Take 2 mg by mouth 4 (four) times daily as needed for diarrhea or loose stools.    Marland Kitchen losartan-hydrochlorothiazide (HYZAAR) 50-12.5 MG per tablet Take 1 tablet by mouth daily.    . pioglitazone (ACTOS) 30 MG tablet Take 30 mg by mouth daily.    . predniSONE (DELTASONE) 10 MG tablet TAKE 1 TABLET ONCE A DAY 30 tablet 0  . predniSONE (STERAPRED UNI-PAK 21 TAB) 10 MG (21) TBPK tablet TAKE 6 TABLETS (60 MG) BY MOUTH ON DAY 1, AND DECREASE BY 1 TABLET (10 MG DAILY) 21 tablet 0  . tiotropium (SPIRIVA) 18 MCG inhalation capsule Place 18 mcg into inhaler and inhale daily.    . Travoprost, BAK Free, (TRAVATAN) 0.004 % SOLN ophthalmic  solution 1 drop at bedtime.    Marland Kitchen zolpidem (AMBIEN) 10 MG tablet Take 10 mg by mouth at bedtime as needed for sleep.     No current facility-administered medications for this visit.    OBJECTIVE:  Filed Vitals:   04/14/15 1414  BP: 131/86  Pulse: 77  Temp: 96.8 F (36 C)     Body mass index is 22.73 kg/(m^2).    ECOG FS:1 - Symptomatic but completely ambulatory  PHYSICAL EXAM:  Gen. status: Patient is alert oriented not any acute distress using oxygen. HEENT: No evidence of stomatitis. Lymphatic system: No palpable supraclavicular cervical axillary lymphadenopathy Lungs: Diminished at [facility] both sides O rhonchi coarse crepitation Cardiac: Soft systolic murmur.  Tachycardia Abdominal exam revealed normal bowel sounds. The abdomen was soft, non-tender, and without masses, organomegaly, or appreciable enlargement of the abdominal aorta. Skin: No  ecchymosis no rash Neurological system no localizing sign All other systems has been examined no abnormality detected  LAB RESULTS:  Infusion on 04/14/2015  Component Date Value Ref Range Status  . WBC 04/14/2015 7.6  3.8 - 10.6 K/uL Final   A-LINE DRAW  . RBC 04/14/2015 4.00* 4.40 - 5.90 MIL/uL Final  . Hemoglobin 04/14/2015 11.0* 13.0 - 18.0 g/dL Final  . HCT 04/14/2015 34.5* 40.0 - 52.0 % Final  . MCV 04/14/2015 86.3  80.0 - 100.0 fL Final  . MCH 04/14/2015 27.5  26.0 - 34.0 pg Final  . MCHC 04/14/2015 31.9* 32.0 - 36.0 g/dL Final  . RDW 04/14/2015 16.0* 11.5 - 14.5 % Final  . Platelets 04/14/2015 146* 150 - 440 K/uL Final  . Neutrophils Relative % 04/14/2015 56   Final  . Neutro Abs 04/14/2015 4.2  1.4 - 6.5 K/uL Final  . Lymphocytes Relative 04/14/2015 28   Final  . Lymphs Abs 04/14/2015 2.1  1.0 - 3.6 K/uL Final  . Monocytes Relative 04/14/2015 12   Final  . Monocytes Absolute 04/14/2015 0.9  0.2 - 1.0 K/uL Final  . Eosinophils Relative 04/14/2015 4   Final  . Eosinophils Absolute 04/14/2015 0.3  0 - 0.7 K/uL Final  .  Basophils Relative 04/14/2015 0   Final  . Basophils Absolute 04/14/2015 0.0  0 - 0.1 K/uL Final  . Sodium 04/14/2015 138  135 - 145 mmol/L Final  . Potassium 04/14/2015 3.8  3.5 - 5.1 mmol/L Final  . Chloride 04/14/2015 98* 101 - 111 mmol/L Final  . CO2 04/14/2015 33* 22 - 32 mmol/L Final  . Glucose, Bld 04/14/2015 150* 65 - 99 mg/dL Final  . BUN 04/14/2015 25* 6 - 20 mg/dL Final  . Creatinine, Ser 04/14/2015 1.22  0.61 - 1.24 mg/dL Final  . Calcium 04/14/2015 8.8* 8.9 - 10.3 mg/dL Final  . Total Protein 04/14/2015 7.3  6.5 - 8.1 g/dL Final  . Albumin 04/14/2015 4.1  3.5 - 5.0 g/dL Final  . AST 04/14/2015 17  15 - 41 U/L Final  . ALT 04/14/2015 12* 17 - 63 U/L Final  . Alkaline Phosphatase 04/14/2015 64  38 - 126 U/L Final  . Total Bilirubin 04/14/2015 0.5  0.3 - 1.2 mg/dL Final  . GFR calc non Af Amer 04/14/2015 >60  >60 mL/min Final  . GFR calc Af Amer 04/14/2015 >60  >60 mL/min Final   Comment: (NOTE) The eGFR has been calculated using the CKD EPI equation. This calculation has not been validated in all clinical situations. eGFR's persistently <60 mL/min signify possible Chronic Kidney Disease.   . Anion gap 04/14/2015 7  5 - 15 Final  . TSH 04/14/2015 1.038  0.350 - 4.500 uIU/mL Final  . Magnesium 04/14/2015 1.5* 1.7 - 2.4 mg/dL Final      STUDIES: Dg Chest 2 View  04/14/2015   CLINICAL DATA:  Malignant neoplasm of the right upper lobe. Followup.  EXAM: CHEST  2 VIEW  COMPARISON:  02/28/2015.  FINDINGS: Left chest wall port a catheter noted with tip in the SVC. Heart size and mediastinal contours appear normal. The lungs are hyperinflated with coarsened interstitial markings of emphysema. The right lung perihilar scarring and fibrosis is again noted. No superimposed airspace consolidation. No specific features identified to suggest progression of right lung cancer.  IMPRESSION: 1. No acute cardiopulmonary abnormalities and no significant change compared with 11/15/2014.    Electronically Signed   By: Kerby Moors M.D.   On: 04/14/2015  14:54    ASSESSMENT: CARCINOMA of lung stage IV disease on NIVOLULAMAB stable disease based on CT scan and a PET scan Carcinoma of kidney Retroperitoneal lymphadenopathy low-grade lymphoma  COPD MEDICAL DECISION MAKING:  Chest x-ray has been reviewed independently appears to be stable.  Proceed with another PET scan prior to next treatment continue nivolulamab2 h  Lab data has been reviewed Patient expressed understanding and was in agreement with this plan. He also understands that He can call clinic at any time with any questions, concerns, or complaints.    Lung cancer, upper lobe   Staging form: Lung, AJCC 7th Edition     Clinical stage from 06/15/2010: yT2, N2, M0 - Signed by [name], NP on 02/03/2015     Pathologic stage from 04/04/2014: yT2, N2, M1 - Signed by [name], NP on 02/03/2015 Lymphoma, small lymphocytic   Staging form: Lymphoid Neoplasms, AJCC 6th Edition     Clinical stage from 10/15/2013: Stage II - Signed by [name], NP on 02/03/2015 Renal cell cancer   Staging form: Kidney, AJCC 7th Edition     Clinical stage from 02/03/2015: Stage I (yT1a, N0, M0) - Signed by [name], NP on 02/03/2015   Forest Gleason, MD   04/15/2015 7:07 AM

## 2015-04-28 ENCOUNTER — Telehealth: Payer: Self-pay | Admitting: *Deleted

## 2015-04-28 ENCOUNTER — Inpatient Hospital Stay: Payer: Federal, State, Local not specified - PPO | Attending: Oncology

## 2015-04-28 ENCOUNTER — Other Ambulatory Visit: Payer: Self-pay | Admitting: Oncology

## 2015-04-28 VITALS — BP 117/74 | HR 106 | Temp 96.4°F | Resp 22

## 2015-04-28 DIAGNOSIS — J449 Chronic obstructive pulmonary disease, unspecified: Secondary | ICD-10-CM | POA: Insufficient documentation

## 2015-04-28 DIAGNOSIS — Z79899 Other long term (current) drug therapy: Secondary | ICD-10-CM | POA: Diagnosis not present

## 2015-04-28 DIAGNOSIS — Z5111 Encounter for antineoplastic chemotherapy: Secondary | ICD-10-CM | POA: Diagnosis not present

## 2015-04-28 DIAGNOSIS — C3411 Malignant neoplasm of upper lobe, right bronchus or lung: Secondary | ICD-10-CM | POA: Diagnosis not present

## 2015-04-28 DIAGNOSIS — C9110 Chronic lymphocytic leukemia of B-cell type not having achieved remission: Secondary | ICD-10-CM | POA: Insufficient documentation

## 2015-04-28 DIAGNOSIS — C649 Malignant neoplasm of unspecified kidney, except renal pelvis: Secondary | ICD-10-CM | POA: Insufficient documentation

## 2015-04-28 DIAGNOSIS — R05 Cough: Secondary | ICD-10-CM | POA: Diagnosis not present

## 2015-04-28 DIAGNOSIS — C3492 Malignant neoplasm of unspecified part of left bronchus or lung: Secondary | ICD-10-CM

## 2015-04-28 DIAGNOSIS — Z87891 Personal history of nicotine dependence: Secondary | ICD-10-CM | POA: Diagnosis not present

## 2015-04-28 DIAGNOSIS — R0602 Shortness of breath: Secondary | ICD-10-CM | POA: Diagnosis not present

## 2015-04-28 DIAGNOSIS — Z8673 Personal history of transient ischemic attack (TIA), and cerebral infarction without residual deficits: Secondary | ICD-10-CM | POA: Diagnosis not present

## 2015-04-28 DIAGNOSIS — I10 Essential (primary) hypertension: Secondary | ICD-10-CM | POA: Insufficient documentation

## 2015-04-28 DIAGNOSIS — E119 Type 2 diabetes mellitus without complications: Secondary | ICD-10-CM | POA: Insufficient documentation

## 2015-04-28 DIAGNOSIS — J209 Acute bronchitis, unspecified: Secondary | ICD-10-CM | POA: Diagnosis not present

## 2015-04-28 MED ORDER — NIVOLUMAB CHEMO INJECTION 100 MG/10ML
200.0000 mg | Freq: Once | INTRAVENOUS | Status: AC
  Administered 2015-04-28: 200 mg via INTRAVENOUS
  Filled 2015-04-28: qty 20

## 2015-04-28 MED ORDER — HYDROCODONE-ACETAMINOPHEN 5-325 MG PO TABS: 1.0000 | ORAL_TABLET | Freq: Four times a day (QID) | ORAL | Status: DC | PRN

## 2015-04-28 MED ORDER — SODIUM CHLORIDE 0.9 % IJ SOLN
10.0000 mL | INTRAMUSCULAR | Status: AC | PRN
  Administered 2015-04-28: 10 mL
  Filled 2015-04-28: qty 10

## 2015-04-28 MED ORDER — HEPARIN SOD (PORK) LOCK FLUSH 100 UNIT/ML IV SOLN
500.0000 [IU] | Freq: Once | INTRAVENOUS | Status: AC | PRN
  Administered 2015-04-28: 500 [IU]
  Filled 2015-04-28: qty 5

## 2015-04-28 MED ORDER — SODIUM CHLORIDE 0.9 % IV SOLN
Freq: Once | INTRAVENOUS | Status: AC
  Administered 2015-04-28: 14:00:00 via INTRAVENOUS
  Filled 2015-04-28: qty 1000

## 2015-04-28 NOTE — Telephone Encounter (Signed)
Informed that prescription is ready to pick up  

## 2015-05-05 ENCOUNTER — Ambulatory Visit
Admission: RE | Admit: 2015-05-05 | Discharge: 2015-05-05 | Disposition: A | Discharge status: Home / Self Care | Payer: Federal, State, Local not specified - PPO | Source: Ambulatory Visit | Attending: Oncology | Admitting: Oncology

## 2015-05-05 DIAGNOSIS — C3411 Malignant neoplasm of upper lobe, right bronchus or lung: Secondary | ICD-10-CM | POA: Diagnosis present

## 2015-05-05 DIAGNOSIS — N2889 Other specified disorders of kidney and ureter: Secondary | ICD-10-CM | POA: Insufficient documentation

## 2015-05-05 LAB — GLUCOSE, CAPILLARY: GLUCOSE-CAPILLARY: 109 mg/dL — AB (ref 65–99)

## 2015-05-05 MED ORDER — FLUDEOXYGLUCOSE F - 18 (FDG) INJECTION
12.9600 | Freq: Once | INTRAVENOUS | Status: AC | PRN
  Administered 2015-05-05: 12.96 via INTRAVENOUS

## 2015-05-12 ENCOUNTER — Inpatient Hospital Stay: Payer: Federal, State, Local not specified - PPO

## 2015-05-12 ENCOUNTER — Encounter: Payer: Self-pay | Admitting: Oncology

## 2015-05-12 ENCOUNTER — Inpatient Hospital Stay (HOSPITAL_BASED_OUTPATIENT_CLINIC_OR_DEPARTMENT_OTHER): Payer: Federal, State, Local not specified - PPO | Admitting: Oncology

## 2015-05-12 VITALS — BP 153/78 | HR 115 | Temp 97.1°F | Wt 155.0 lb

## 2015-05-12 DIAGNOSIS — J449 Chronic obstructive pulmonary disease, unspecified: Secondary | ICD-10-CM

## 2015-05-12 DIAGNOSIS — J209 Acute bronchitis, unspecified: Secondary | ICD-10-CM

## 2015-05-12 DIAGNOSIS — R0602 Shortness of breath: Secondary | ICD-10-CM

## 2015-05-12 DIAGNOSIS — Z79899 Other long term (current) drug therapy: Secondary | ICD-10-CM

## 2015-05-12 DIAGNOSIS — E119 Type 2 diabetes mellitus without complications: Secondary | ICD-10-CM

## 2015-05-12 DIAGNOSIS — C3411 Malignant neoplasm of upper lobe, right bronchus or lung: Secondary | ICD-10-CM

## 2015-05-12 DIAGNOSIS — Z8673 Personal history of transient ischemic attack (TIA), and cerebral infarction without residual deficits: Secondary | ICD-10-CM

## 2015-05-12 DIAGNOSIS — C9110 Chronic lymphocytic leukemia of B-cell type not having achieved remission: Secondary | ICD-10-CM

## 2015-05-12 DIAGNOSIS — C3492 Malignant neoplasm of unspecified part of left bronchus or lung: Secondary | ICD-10-CM

## 2015-05-12 DIAGNOSIS — R05 Cough: Secondary | ICD-10-CM

## 2015-05-12 DIAGNOSIS — Z87891 Personal history of nicotine dependence: Secondary | ICD-10-CM

## 2015-05-12 DIAGNOSIS — C649 Malignant neoplasm of unspecified kidney, except renal pelvis: Secondary | ICD-10-CM

## 2015-05-12 DIAGNOSIS — I10 Essential (primary) hypertension: Secondary | ICD-10-CM

## 2015-05-12 LAB — CBC WITH DIFFERENTIAL/PLATELET
BASOS PCT: 1 %
Basophils Absolute: 0.1 10*3/uL (ref 0–0.1)
EOS ABS: 0.4 10*3/uL (ref 0–0.7)
Eosinophils Relative: 5 %
HCT: 32.9 % — ABNORMAL LOW (ref 40.0–52.0)
HEMOGLOBIN: 10.3 g/dL — AB (ref 13.0–18.0)
Lymphocytes Relative: 20 %
Lymphs Abs: 1.6 10*3/uL (ref 1.0–3.6)
MCH: 27.2 pg (ref 26.0–34.0)
MCHC: 31.1 g/dL — ABNORMAL LOW (ref 32.0–36.0)
MCV: 87.3 fL (ref 80.0–100.0)
MONO ABS: 0.6 10*3/uL (ref 0.2–1.0)
MONOS PCT: 7 %
NEUTROS ABS: 5.5 10*3/uL (ref 1.4–6.5)
Neutrophils Relative %: 67 %
PLATELETS: 185 10*3/uL (ref 150–440)
RBC: 3.77 MIL/uL — ABNORMAL LOW (ref 4.40–5.90)
RDW: 15.9 % — AB (ref 11.5–14.5)
WBC: 8.2 10*3/uL (ref 3.8–10.6)

## 2015-05-12 LAB — COMPREHENSIVE METABOLIC PANEL
ALT: 14 U/L — AB (ref 17–63)
AST: 17 U/L (ref 15–41)
Albumin: 3.9 g/dL (ref 3.5–5.0)
Alkaline Phosphatase: 61 U/L (ref 38–126)
Anion gap: 5 (ref 5–15)
BUN: 19 mg/dL (ref 6–20)
CO2: 35 mmol/L — ABNORMAL HIGH (ref 22–32)
Calcium: 8.3 mg/dL — ABNORMAL LOW (ref 8.9–10.3)
Chloride: 98 mmol/L — ABNORMAL LOW (ref 101–111)
Creatinine, Ser: 1.11 mg/dL (ref 0.61–1.24)
GFR calc Af Amer: >60 mL/min (ref >60)
Glucose, Bld: 135 mg/dL — ABNORMAL HIGH (ref 65–99)
Potassium: 3.8 mmol/L (ref 3.5–5.1)
SODIUM: 138 mmol/L (ref 135–145)
TOTAL PROTEIN: 7 g/dL (ref 6.5–8.1)
Total Bilirubin: 0.6 mg/dL (ref 0.3–1.2)

## 2015-05-12 MED ORDER — SODIUM CHLORIDE 0.9 % IJ SOLN
10.0000 mL | INTRAMUSCULAR | Status: AC | PRN
  Administered 2015-05-12: 10 mL via INTRAVENOUS
  Filled 2015-05-12: qty 10

## 2015-05-12 MED ORDER — HEPARIN SOD (PORK) LOCK FLUSH 100 UNIT/ML IV SOLN
500.0000 [IU] | Freq: Once | INTRAVENOUS | Status: AC
Start: 2015-05-12 — End: 2015-05-12
  Administered 2015-05-12: 500 [IU] via INTRAVENOUS

## 2015-05-12 MED ORDER — AMOXICILLIN-POT CLAVULANATE 875-125 MG PO TABS: 1.0000 | ORAL_TABLET | Freq: Two times a day (BID) | ORAL | Status: DC

## 2015-05-12 MED ORDER — HYDROCODONE-ACETAMINOPHEN 5-325 MG PO TABS: 1.0000 | ORAL_TABLET | Freq: Four times a day (QID) | ORAL | Status: DC | PRN

## 2015-05-12 MED ORDER — PREDNISONE 10 MG PO TABS: ORAL_TABLET | ORAL | Status: DC

## 2015-05-14 NOTE — Progress Notes (Signed)
Informed that prescription is ready to pick up  [place] @ [facility] [phone]  [phone] [place]. OB: [birth year]  MR#: [id number]  KPV#:[id number]  Patient Care Team: [name], MD as PCP - General (Family Medicine)  CHIEF COMPLAINT:  Chief Complaint  Patient presents with  . Follow-up    lung cancer    Oncology History   1. Right upper lobe lung mass biopsies positive for adenocarcinoma.   Based on PET scan patient has T2, N2, M0 tumor stage III a 2. Poor pulmonary function 3. Patient was started on carboplatinum Alimta and Avastin   on april  9th of 2013 4. Patient had an allergic reaction to carboplatinum with acute bronchospasm in May of 2013.  Patient is now being taken off carboplatinum. 5. Started on maintenance Alimta and Avastin  from June of 2013 hold chemotherapy because of increasing shortness of breath (in July, 2013) 6.VARISTRAT test is good started on Tarceva February 3 about 2014 diarrhea 4 days after Tarceva was started.  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 9.bnormal CT and MRI scan of the right kidneywith solid massin the upper pole right kidney 10.biopsy of renal masses consistent with primary renal cell cancer 11.  Patient started on  [facility] August 09, 2014     Lung cancer, upper lobe   02/03/2015 Initial Diagnosis Lung cancer, upper lobe    Oncology Flowsheet 02/14/2015 02/28/2015 03/24/2015 04/14/2015 04/28/2015  nivolumab (OPDIVO) IV 200 mg 200 mg 200 mg 200 mg 200 mg    INTERVAL HISTORY: 64 year old gentleman with multiple malignancy presently being treated for recurrent carcinoma of lung.  On oxygen.  No diarrhea.  No rash.  Patient has been gradually improving.  Shortness of breath is improved.  No nausea or vomiting.  Appetite is improved.  Here for further follow-up and  treatment consideration April 14, 2015 Patient is here for ongoing evaluation and continuation of chemotherapy withn ivolulamab.  Tolerating treatment very well.  No diarrhea.  No nausea no vomiting.  Had a chest x-ray which has been reviewed and was stable . May 12, 2015 Patient is here for ongoing evaluation regarding carcinoma of lung, lymphoma, carcinoma of kidney complaints of congestion increasing shortness of breath cough yellowish expectoration no fever patient also had another CT scan for complete evaluation. REVIEW OF SYSTEMS:   Gen. status: Patient's performance status is stable Lungs: Increasing shortness of breath.  Cough.  Yellowish expectoration.  No chills or fever Cardiac: No chest pain or paroxysmal nocturnal dyspnea Abdomen: No nausea no vomiting no diarrhea GU: No hematuria dysuria Lower extremity no swelling Skin: No rash Neurological system no headache no dizziness Pain has improved taking Vicodin  As per HPI. Otherwise, a complete review of systems is negatve.  PAST MEDICAL HISTORY: Past Medical History  Diagnosis Date  . Lung cancer     right upper lobe mass positive for adenocarcinoma  . Cancer   . Renal cancer   . Lymphoma     low grade  . COPD (chronic obstructive pulmonary disease)   . Stroke   . Hypertension   . Diabetes mellitus without complication   . Respiratory failure 07/12/2010    acute  . Colon cancer   . Tuberculosis   . Gout   . Glaucoma   . Pancreatitis   . Cholecystitis  PAST SURGICAL HIST0RY   COPD:    acute resp failure: 12-Jul-2010   lung ca:    CVA/Stroke:    HTN:    Diabetes Mellitus, Type II (NIDD):   Smoking History: Smoking History 3(1)quit Sept. 2011(1)  PFSH: Comments: history of colon cancer, doubt, diabetes, tuberculosis  Comments: has been a chronic smoker for more than 25 years pack a day history does not drink.  Works in the [employer]  Additional Past Medical and Surgical History: diabetes, type  II,.  Hypertensin, history of stroke, chronic obstructive pulmonary disease,.  glaucoma.  History of pancreatitis and cholecystitis       ADVANCED DIRECTIVES: Patient does have advanced care directive   HEALTH MAINTENANCE: History  Substance Use Topics  . Smoking status: Former Smoker -- 1.00 packs/day for 25 years    Quit date: 01/13/2010  . Smokeless tobacco: Not on file  . Alcohol Use: No       Allergies  Allergen Reactions  . Iodine Shortness Of Breath, Diarrhea and Nausea And Vomiting  . Nexium [Esomeprazole Magnesium] Other (See Comments)    headache    Current Outpatient Prescriptions  Medication Sig Dispense Refill  . albuterol (PROVENTIL HFA;VENTOLIN HFA) 108 (90 BASE) MCG/ACT inhaler Inhale 2 puffs into the lungs every 6 (six) hours as needed for wheezing or shortness of breath.    Marland Kitchen albuterol (PROVENTIL) (2.5 MG/3ML) 0.083% nebulizer solution USE 1 VIAL IN NEBULIZER EVERY 3-4 HOURS AS NEEDED 75 mL 3  . amoxicillin-clavulanate (AUGMENTIN) 875-125 MG per tablet Take 1 tablet by mouth 2 (two) times daily. 14 tablet 0  . budesonide-formoterol (SYMBICORT) 160-4.5 MCG/ACT inhaler Inhale 2 puffs into the lungs 2 (two) times daily.    . diphenhydrAMINE (BENADRYL) 12.5 MG chewable tablet Chew 25 mg by mouth daily as needed for allergies.    Marland Kitchen glipiZIDE (GLUCOTROL) 10 MG tablet Take 10 mg by mouth daily before breakfast.    . HYDROcodone-acetaminophen (NORCO/VICODIN) 5-325 MG per tablet Take 1-2 tablets by mouth every 6 (six) hours as needed for moderate pain. 90 tablet 0  . ipratropium-albuterol (DUONEB) 0.5-2.5 (3) MG/3ML SOLN Take 3 mLs by nebulization every 6 (six) hours as needed (shortness of breath).    . latanoprost (XALATAN) 0.005 % ophthalmic solution     . loperamide (IMODIUM A-D) 2 MG tablet Take 2 mg by mouth 4 (four) times daily as needed for diarrhea or loose stools.    Marland Kitchen losartan-hydrochlorothiazide (HYZAAR) 50-12.5 MG per tablet Take 1 tablet by mouth daily.      . pioglitazone (ACTOS) 30 MG tablet Take 30 mg by mouth daily.    . predniSONE (DELTASONE) 10 MG tablet TAKE 1 TABLET BY MOUTH EVERY DAY 30 tablet 3  . predniSONE (DELTASONE) 10 MG tablet 26m taper by 150mPO daily to 0 21 tablet 0  . temazepam (RESTORIL) 7.5 MG capsule Take 1-2 capsules by mouth at bedtime as needed.  2  . tiotropium (SPIRIVA) 18 MCG inhalation capsule Place 18 mcg into inhaler and inhale daily.    . Travoprost, BAK Free, (TRAVATAN) 0.004 % SOLN ophthalmic solution 1 drop at bedtime.    . Marland Kitchenolpidem (AMBIEN) 10 MG tablet Take 10 mg by mouth at bedtime as needed for sleep.     No current facility-administered medications for this visit.   Facility-Administered Medications Ordered in Other Visits  Medication Dose Route Frequency Provider Last Rate Last Dose  . sodium chloride 0.9 % injection 10 mL  10 mL Intracatheter PRN JaDelorise Shiner  Delan Ksiazek, MD   10 mL at 04/28/15 1410  . sodium chloride 0.9 % injection 10 mL  10 mL Intravenous PRN Forest Gleason, MD   10 mL at 05/12/15 1350    OBJECTIVE:  Filed Vitals:   05/12/15 1426  BP: 153/78  Pulse: 115  Temp: 97.1 F (36.2 C)     Body mass index is 23.57 kg/(m^2).    ECOG FS:1 - Symptomatic but completely ambulatory  PHYSICAL EXAM:  Gen. status: Patient is alert oriented not any acute distress using oxygen. HEENT: No evidence of stomatitis. Lymphatic system: No palpable supraclavicular cervical axillary lymphadenopathy Lungs: Diminished at [facility] both sides O rhonchi coarse crepitation Bilateral rhonchi Cardiac: Soft systolic murmur.  Tachycardia Abdominal exam revealed normal bowel sounds. The abdomen was soft, non-tender, and without masses, organomegaly, or appreciable enlargement of the abdominal aorta. Skin: No ecchymosis no rash Neurological system no localizing sign All other systems has been examined no abnormality detected  LAB RESULTS:  Infusion on 05/12/2015  Component Date Value Ref Range Status  . WBC 05/12/2015  8.2  3.8 - 10.6 K/uL Final   A-LINE DRAW  . RBC 05/12/2015 3.77* 4.40 - 5.90 MIL/uL Final  . Hemoglobin 05/12/2015 10.3* 13.0 - 18.0 g/dL Final  . HCT 05/12/2015 32.9* 40.0 - 52.0 % Final  . MCV 05/12/2015 87.3  80.0 - 100.0 fL Final  . MCH 05/12/2015 27.2  26.0 - 34.0 pg Final  . MCHC 05/12/2015 31.1* 32.0 - 36.0 g/dL Final  . RDW 05/12/2015 15.9* 11.5 - 14.5 % Final  . Platelets 05/12/2015 185  150 - 440 K/uL Final  . Neutrophils Relative % 05/12/2015 67   Final  . Neutro Abs 05/12/2015 5.5  1.4 - 6.5 K/uL Final  . Lymphocytes Relative 05/12/2015 20   Final  . Lymphs Abs 05/12/2015 1.6  1.0 - 3.6 K/uL Final  . Monocytes Relative 05/12/2015 7   Final  . Monocytes Absolute 05/12/2015 0.6  0.2 - 1.0 K/uL Final  . Eosinophils Relative 05/12/2015 5   Final  . Eosinophils Absolute 05/12/2015 0.4  0 - 0.7 K/uL Final  . Basophils Relative 05/12/2015 1   Final  . Basophils Absolute 05/12/2015 0.1  0 - 0.1 K/uL Final  . Sodium 05/12/2015 138  135 - 145 mmol/L Final  . Potassium 05/12/2015 3.8  3.5 - 5.1 mmol/L Final  . Chloride 05/12/2015 98* 101 - 111 mmol/L Final  . CO2 05/12/2015 35* 22 - 32 mmol/L Final  . Glucose, Bld 05/12/2015 135* 65 - 99 mg/dL Final  . BUN 05/12/2015 19  6 - 20 mg/dL Final  . Creatinine, Ser 05/12/2015 1.11  0.61 - 1.24 mg/dL Final  . Calcium 05/12/2015 8.3* 8.9 - 10.3 mg/dL Final  . Total Protein 05/12/2015 7.0  6.5 - 8.1 g/dL Final  . Albumin 05/12/2015 3.9  3.5 - 5.0 g/dL Final  . AST 05/12/2015 17  15 - 41 U/L Final  . ALT 05/12/2015 14* 17 - 63 U/L Final  . Alkaline Phosphatase 05/12/2015 61  38 - 126 U/L Final  . Total Bilirubin 05/12/2015 0.6  0.3 - 1.2 mg/dL Final  . GFR calc non Af Amer 05/12/2015 >60  >60 mL/min Final  . GFR calc Af Amer 05/12/2015 >60  >60 mL/min Final   Comment: (NOTE) The eGFR has been calculated using the CKD EPI equation. This calculation has not been validated in all clinical situations. eGFR's persistently <60 mL/min signify  possible Chronic Kidney Disease.   Georgiann Hahn  gap 05/12/2015 5  5 - 15 Final      STUDIES: Nm Pet Image Restag (ps) Skull Base To Thigh  05/05/2015   CLINICAL DATA:  Subsequent treatment strategy for Lung cancer.  EXAM: NUCLEAR MEDICINE PET SKULL BASE TO THIGH  TECHNIQUE: 12.96 mCi F-18 FDG was injected intravenously. Full-ring PET imaging was performed from the skull base to thigh after the radiotracer. CT data was obtained and used for attenuation correction and anatomic localization.  FASTING BLOOD GLUCOSE:  Value: 109 mg/dl  COMPARISON:  12/13/2014  FINDINGS: NECK  No hypermetabolic lymph nodes in the neck.  CHEST  No hypermetabolic mediastinal or hilar lymph nodes. Tumor within the posterior and medial right upper lobe measures 5.5 cm and has an SUV max equal to 2.84. Previously this measured 5.6 cm and had an SUV max equal to 2.58. Previously referenced lingular opacity measures 1.6 cm, image 116 of/series 3. Unchanged from previous exam. No new hypermetabolic pulmonary nodules are mass is noted.  ABDOMEN/PELVIS  No abnormal hypermetabolic activity within the liver, pancreas, adrenal glands, or spleen. No hypermetabolic lymph nodes in the abdomen or pelvis. Prominent upper abdominal lymph nodes are again noted. Index periaortic lymph node measures 1.4 cm, image 171/series 3. Previously 1.2 cm. The index left periaortic lymph node measures 1.3 cm, image 182/series 3. Previously 1 cm. Right kidney mass is is difficult to visualized due to lack of IV contrast material. This exhibits intense FDG uptake within SUV max equal to 10.9. Previously 6.7.  SKELETON  No focal hypermetabolic activity to suggest skeletal metastasis.  IMPRESSION: 1. Stable size and degree of FDG uptake associated with the posterior right upper lobe tumor. 2. Mild increase in size of retroperitoneal adenopathy. This is likely related to patient's known lymphoma/ leukemia. 3. Lingular consolidation and volume loss is stable from previous  exam. 4. Right kidney mass exhibits increased FDG uptake which has progressed from previous study. This would be better assessed with followup contrast enhanced MR of the kidneys.   Electronically Signed   By: Kerby Moors M.D.   On: 05/05/2015 12:22    ASSESSMENT: CARCINOMA of lung stage IV disease on NIVOLULAMAB stable disease based on CT scan and a PET scan Carcinoma of kidney Retroperitoneal lymphadenopathy low-grade lymphoma  COPD MEDICAL DECISION MAKING:  All lab data has been reviewed.  PET scan has been reviewed independently. Lung mass has been stable Increase in retroperitoneal lymphadenopathy Increased activity with a kidney mass Patient also has acute bronchitis In view of that we will hold off any further chemotherapy Start patient on corticosteroid prednisone 60 mg taper by 10-0 Antibiotic We evaluated patient in 1 week or before if patient develops any fever     Lung cancer, upper lobe   Staging form: Lung, AJCC 7th Edition     Clinical stage from 06/15/2010: yT2, N2, M0 - Signed by [name], NP on 02/03/2015     Pathologic stage from 04/04/2014: Maureen Chatters, M1 - Signed by [name], NP on 02/03/2015 Lymphoma, small lymphocytic   Staging form: Lymphoid Neoplasms, AJCC 6th Edition     Clinical stage from 10/15/2013: Stage II - Signed by [name], NP on 02/03/2015 Renal cell cancer   Staging form: Kidney, AJCC 7th Edition     Clinical stage from 02/03/2015: Stage I (yT1a, N0, M0) - Signed by [name], NP on 02/03/2015   Forest Gleason, MD   05/14/2015 1:52 PM

## 2015-05-19 ENCOUNTER — Inpatient Hospital Stay: Payer: Federal, State, Local not specified - PPO

## 2015-05-19 ENCOUNTER — Encounter: Payer: Self-pay | Admitting: Oncology

## 2015-05-19 ENCOUNTER — Inpatient Hospital Stay: Payer: Federal, State, Local not specified - PPO | Attending: Oncology

## 2015-05-19 ENCOUNTER — Inpatient Hospital Stay (HOSPITAL_BASED_OUTPATIENT_CLINIC_OR_DEPARTMENT_OTHER): Payer: Federal, State, Local not specified - PPO | Admitting: Oncology

## 2015-05-19 VITALS — BP 124/82 | HR 111 | Temp 95.5°F | Resp 18 | Wt 151.5 lb

## 2015-05-19 DIAGNOSIS — C3410 Malignant neoplasm of upper lobe, unspecified bronchus or lung: Secondary | ICD-10-CM

## 2015-05-19 DIAGNOSIS — R0602 Shortness of breath: Secondary | ICD-10-CM

## 2015-05-19 DIAGNOSIS — E119 Type 2 diabetes mellitus without complications: Secondary | ICD-10-CM | POA: Insufficient documentation

## 2015-05-19 DIAGNOSIS — C9110 Chronic lymphocytic leukemia of B-cell type not having achieved remission: Secondary | ICD-10-CM

## 2015-05-19 DIAGNOSIS — Z7952 Long term (current) use of systemic steroids: Secondary | ICD-10-CM

## 2015-05-19 DIAGNOSIS — Z85038 Personal history of other malignant neoplasm of large intestine: Secondary | ICD-10-CM | POA: Insufficient documentation

## 2015-05-19 DIAGNOSIS — I10 Essential (primary) hypertension: Secondary | ICD-10-CM | POA: Insufficient documentation

## 2015-05-19 DIAGNOSIS — R05 Cough: Secondary | ICD-10-CM | POA: Diagnosis not present

## 2015-05-19 DIAGNOSIS — C649 Malignant neoplasm of unspecified kidney, except renal pelvis: Secondary | ICD-10-CM | POA: Diagnosis not present

## 2015-05-19 DIAGNOSIS — Z79899 Other long term (current) drug therapy: Secondary | ICD-10-CM | POA: Diagnosis not present

## 2015-05-19 DIAGNOSIS — C3411 Malignant neoplasm of upper lobe, right bronchus or lung: Secondary | ICD-10-CM

## 2015-05-19 DIAGNOSIS — Z87891 Personal history of nicotine dependence: Secondary | ICD-10-CM | POA: Diagnosis not present

## 2015-05-19 DIAGNOSIS — J449 Chronic obstructive pulmonary disease, unspecified: Secondary | ICD-10-CM

## 2015-05-19 DIAGNOSIS — Z9221 Personal history of antineoplastic chemotherapy: Secondary | ICD-10-CM | POA: Diagnosis not present

## 2015-05-19 DIAGNOSIS — Z8673 Personal history of transient ischemic attack (TIA), and cerebral infarction without residual deficits: Secondary | ICD-10-CM

## 2015-05-19 DIAGNOSIS — C3492 Malignant neoplasm of unspecified part of left bronchus or lung: Secondary | ICD-10-CM

## 2015-05-19 LAB — CBC WITH DIFFERENTIAL/PLATELET
BASOS ABS: 0 10*3/uL (ref 0–0.1)
Basophils Relative: 0 %
EOS PCT: 7 %
Eosinophils Absolute: 0.6 10*3/uL (ref 0–0.7)
HCT: 34.4 % — ABNORMAL LOW (ref 40.0–52.0)
Hemoglobin: 11 g/dL — ABNORMAL LOW (ref 13.0–18.0)
Lymphocytes Relative: 36 %
Lymphs Abs: 2.9 10*3/uL (ref 1.0–3.6)
MCH: 27.7 pg (ref 26.0–34.0)
MCHC: 32 g/dL (ref 32.0–36.0)
MCV: 86.5 fL (ref 80.0–100.0)
Monocytes Absolute: 0.7 10*3/uL (ref 0.2–1.0)
Monocytes Relative: 9 %
NEUTROS ABS: 3.7 10*3/uL (ref 1.4–6.5)
Neutrophils Relative %: 48 %
Platelets: 159 10*3/uL (ref 150–440)
RBC: 3.98 MIL/uL — AB (ref 4.40–5.90)
RDW: 16.3 % — ABNORMAL HIGH (ref 11.5–14.5)
WBC: 8 10*3/uL (ref 3.8–10.6)

## 2015-05-19 LAB — COMPREHENSIVE METABOLIC PANEL
ALBUMIN: 4 g/dL (ref 3.5–5.0)
ALT: 17 U/L (ref 17–63)
AST: 17 U/L (ref 15–41)
Alkaline Phosphatase: 59 U/L (ref 38–126)
Anion gap: 5 (ref 5–15)
BILIRUBIN TOTAL: 0.6 mg/dL (ref 0.3–1.2)
BUN: 28 mg/dL — AB (ref 6–20)
CO2: 38 mmol/L — AB (ref 22–32)
CREATININE: 1.14 mg/dL (ref 0.61–1.24)
Calcium: 8.8 mg/dL — ABNORMAL LOW (ref 8.9–10.3)
Chloride: 98 mmol/L — ABNORMAL LOW (ref 101–111)
GFR calc non Af Amer: >60 mL/min (ref >60)
Glucose, Bld: 132 mg/dL — ABNORMAL HIGH (ref 65–99)
POTASSIUM: 4.3 mmol/L (ref 3.5–5.1)
Sodium: 141 mmol/L (ref 135–145)
TOTAL PROTEIN: 7 g/dL (ref 6.5–8.1)

## 2015-05-19 MED ORDER — HEPARIN SOD (PORK) LOCK FLUSH 100 UNIT/ML IV SOLN
500.0000 [IU] | Freq: Once | INTRAVENOUS | Status: AC
  Administered 2015-05-19: 500 [IU] via INTRAVENOUS

## 2015-05-19 MED ORDER — PREDNISONE 10 MG PO TABS: 10.0000 mg | ORAL_TABLET | Freq: Every day | ORAL | Status: DC

## 2015-05-19 MED ORDER — SODIUM CHLORIDE 0.9 % IJ SOLN
10.0000 mL | INTRAMUSCULAR | Status: DC | PRN
  Filled 2015-05-19: qty 10

## 2015-05-19 MED ORDER — HEPARIN SOD (PORK) LOCK FLUSH 100 UNIT/ML IV SOLN
INTRAVENOUS | Status: AC
  Filled 2015-05-19: qty 5

## 2015-05-19 NOTE — Progress Notes (Signed)
Patient does have living will. Former smoker.

## 2015-05-24 NOTE — Progress Notes (Signed)
Informed that prescription is ready to pick up  Cancer Center @ [facility] [phone]  [phone]     Craig [name]. OB: June 15, 1951  MR#: [id number]  AJS#:[id number]  Patient Care Team: Dorothey Baseman, MD as PCP - General (Family Medicine)  CHIEF COMPLAINT:  Chief Complaint  Patient presents with  . Follow-up    Oncology History   1. Right upper lobe lung mass biopsies positive for adenocarcinoma.   Based on PET scan patient has T2, N2, M0 tumor stage III a 2. Poor pulmonary function 3. Patient was started on carboplatinum Alimta and Avastin   on april  9th of 2013 4. Patient had an allergic reaction to carboplatinum with acute bronchospasm in May of 2013.  Patient is now being taken off carboplatinum. 5. Started on maintenance Alimta and Avastin  from June of 2013 hold chemotherapy because of increasing shortness of breath (in July, 2013) 6.VARISTRAT test is good started on Tarceva February 3 about 2014 diarrhea 4 days after Tarceva was started.  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 9.bnormal CT and MRI scan of the right kidneywith solid massin the upper pole right kidney 10.biopsy of renal masses consistent with primary renal cell cancer 11.  Patient started on  [facility] August 09, 2014 12 NIVOLULAMAB has been put on hold from August of 2016 because of   PROGRESSIVE  respiratory difficulty     Lung cancer, upper lobe   02/03/2015 Initial Diagnosis Lung cancer, upper lobe    Oncology Flowsheet 02/14/2015 02/28/2015 03/24/2015 04/14/2015 04/28/2015  nivolumab (OPDIVO) IV 200 mg 200 mg 200 mg 200 mg 200 mg    INTERVAL HISTORY: 64 year old gentleman with multiple malignancy presently being treated for recurrent carcinoma of lung.  On oxygen.  No diarrhea.  No rash.  Patient has been gradually improving.  Shortness of breath is  improved.  No nausea or vomiting.  Appetite is improved.  Here for further follow-up and treatment consideration April 14, 2015 Patient is here for ongoing evaluation and continuation of chemotherapy withn ivolulamab.  Tolerating treatment very well.  No diarrhea.  No nausea no vomiting.  Had a chest x-ray which has been reviewed and was stable . May 12, 2015 Patient is here for ongoing evaluation regarding carcinoma of lung, lymphoma, carcinoma of kidney complaints of congestion increasing shortness of breath cough yellowish expectoration no fever patient also had another CT scan for complete evaluation. May 19, 2015 Recent has been gradually getting off prednisone on therapy.  Congestion is improved.  But performance status continues to remain poor. Shortness of breath persists cough persists.  Appetite has been stable.  No chills.  No fever.  No hemoptysis or chest pain REVIEW OF SYSTEMS:   Gen. status: Patient's performance status is stable Lungs: Increasing shortness of breath.  Cough.  Yellowish expectoration.  No chills or fever Cardiac: No chest pain or paroxysmal nocturnal dyspnea Abdomen: No nausea no vomiting no diarrhea GU: No hematuria dysuria Lower extremity no swelling Skin: No rash Neurological system no headache no dizziness Pain has improved taking Vicodin  As per HPI. Otherwise, a complete review of systems is negatve.  PAST MEDICAL HISTORY: Past Medical History  Diagnosis Date  . Lung cancer     right upper lobe mass positive for adenocarcinoma  . Cancer   . Renal cancer   . Lymphoma  low grade  . COPD (chronic obstructive pulmonary disease)   . Stroke   . Hypertension   . Diabetes mellitus without complication   . Respiratory failure 07/12/2010    acute  . Colon cancer   . Tuberculosis   . Gout   . Glaucoma   . Pancreatitis   . Cholecystitis     PAST SURGICAL HIST0RY   COPD:    acute resp failure: 12-Jul-2010   lung ca:    CVA/Stroke:      HTN:    Diabetes Mellitus, Type II (NIDD):   Smoking History: Smoking History 3(1)quit Sept. 2011(1)  PFSH: Comments: history of colon cancer, doubt, diabetes, tuberculosis  Comments: has been a chronic smoker for more than 25 years pack a day history does not drink.  Works in the [employer]  Additional Past Medical and Surgical History: diabetes, type II,.  Hypertensin, history of stroke, chronic obstructive pulmonary disease,.  glaucoma.  History of pancreatitis and cholecystitis       ADVANCED DIRECTIVES: Patient does have advanced care directive   HEALTH MAINTENANCE: History  Substance Use Topics  . Smoking status: Former Smoker -- 1.00 packs/day for 25 years    Quit date: 01/13/2010  . Smokeless tobacco: Not on file  . Alcohol Use: No       Allergies  Allergen Reactions  . Iodine Shortness Of Breath, Diarrhea and Nausea And Vomiting  . Nexium [Esomeprazole Magnesium] Other (See Comments)    headache    Current Outpatient Prescriptions  Medication Sig Dispense Refill  . albuterol (PROVENTIL HFA;VENTOLIN HFA) 108 (90 BASE) MCG/ACT inhaler Inhale 2 puffs into the lungs every 6 (six) hours as needed for wheezing or shortness of breath.    Marland Kitchen albuterol (PROVENTIL) (2.5 MG/3ML) 0.083% nebulizer solution USE 1 VIAL IN NEBULIZER EVERY 3-4 HOURS AS NEEDED 75 mL 3  . amoxicillin-clavulanate (AUGMENTIN) 875-125 MG per tablet Take 1 tablet by mouth 2 (two) times daily. 14 tablet 0  . budesonide-formoterol (SYMBICORT) 160-4.5 MCG/ACT inhaler Inhale 2 puffs into the lungs 2 (two) times daily.    . diphenhydrAMINE (BENADRYL) 12.5 MG chewable tablet Chew 25 mg by mouth daily as needed for allergies.    Marland Kitchen glipiZIDE (GLUCOTROL) 10 MG tablet Take 10 mg by mouth daily before breakfast.    . HYDROcodone-acetaminophen (NORCO/VICODIN) 5-325 MG per tablet Take 1-2 tablets by mouth every 6 (six) hours as needed for moderate pain. 90 tablet 0  . ipratropium-albuterol (DUONEB) 0.5-2.5 (3)  MG/3ML SOLN Take 3 mLs by nebulization every 6 (six) hours as needed (shortness of breath).    . latanoprost (XALATAN) 0.005 % ophthalmic solution     . loperamide (IMODIUM A-D) 2 MG tablet Take 2 mg by mouth 4 (four) times daily as needed for diarrhea or loose stools.    Marland Kitchen losartan-hydrochlorothiazide (HYZAAR) 50-12.5 MG per tablet Take 1 tablet by mouth daily.    . pioglitazone (ACTOS) 30 MG tablet Take 30 mg by mouth daily.    . predniSONE (DELTASONE) 10 MG tablet $RemoveB'60mg'NlkuwdsC$  taper by $Remove'10mg'ySNbYFX$  PO daily to 0 21 tablet 0  . predniSONE (DELTASONE) 10 MG tablet Take 1 tablet (10 mg total) by mouth daily. 30 tablet 3  . temazepam (RESTORIL) 7.5 MG capsule Take 1-2 capsules by mouth at bedtime as needed.  2  . tiotropium (SPIRIVA) 18 MCG inhalation capsule Place 18 mcg into inhaler and inhale daily.    . Travoprost, BAK Free, (TRAVATAN) 0.004 % SOLN ophthalmic solution 1 drop at bedtime.    Marland Kitchen  zolpidem (AMBIEN) 10 MG tablet Take 10 mg by mouth at bedtime as needed for sleep.    . theophylline (UNIPHYL) 400 MG 24 hr tablet      No current facility-administered medications for this visit.   Facility-Administered Medications Ordered in Other Visits  Medication Dose Route Frequency Provider Last Rate Last Dose  . sodium chloride 0.9 % injection 10 mL  10 mL Intracatheter PRN Forest Gleason, MD   10 mL at 04/28/15 1410  . sodium chloride 0.9 % injection 10 mL  10 mL Intravenous PRN Forest Gleason, MD   10 mL at 05/12/15 1350    OBJECTIVE:  Filed Vitals:   05/19/15 1408  BP: 124/82  Pulse: 111  Temp: 95.5 F (35.3 C)  Resp: 18     Body mass index is 23.04 kg/(m^2).    ECOG FS:1 - Symptomatic but completely ambulatory  PHYSICAL EXAM:  Gen. status: Patient is alert oriented not any acute distress using oxygen. HEENT: No evidence of stomatitis. Lymphatic system: No palpable supraclavicular cervical axillary lymphadenopathy Lungs: Diminished at [facility] both sides O rhonchi coarse crepitation Bilateral  rhonchi Cardiac: Soft systolic murmur.  Tachycardia Abdominal exam revealed normal bowel sounds. The abdomen was soft, non-tender, and without masses, organomegaly, or appreciable enlargement of the abdominal aorta. Skin: No ecchymosis no rash Neurological system no localizing sign All other systems has been examined no abnormality detected  LAB RESULTS:  Infusion on 05/19/2015  Component Date Value Ref Range Status  . WBC 05/19/2015 8.0  3.8 - 10.6 K/uL Final   A-LINE DRAW  . RBC 05/19/2015 3.98* 4.40 - 5.90 MIL/uL Final  . Hemoglobin 05/19/2015 11.0* 13.0 - 18.0 g/dL Final  . HCT 05/19/2015 34.4* 40.0 - 52.0 % Final  . MCV 05/19/2015 86.5  80.0 - 100.0 fL Final  . MCH 05/19/2015 27.7  26.0 - 34.0 pg Final  . MCHC 05/19/2015 32.0  32.0 - 36.0 g/dL Final  . RDW 05/19/2015 16.3* 11.5 - 14.5 % Final  . Platelets 05/19/2015 159  150 - 440 K/uL Final  . Neutrophils Relative % 05/19/2015 48   Final  . Neutro Abs 05/19/2015 3.7  1.4 - 6.5 K/uL Final  . Lymphocytes Relative 05/19/2015 36   Final  . Lymphs Abs 05/19/2015 2.9  1.0 - 3.6 K/uL Final  . Monocytes Relative 05/19/2015 9   Final  . Monocytes Absolute 05/19/2015 0.7  0.2 - 1.0 K/uL Final  . Eosinophils Relative 05/19/2015 7   Final  . Eosinophils Absolute 05/19/2015 0.6  0 - 0.7 K/uL Final  . Basophils Relative 05/19/2015 0   Final  . Basophils Absolute 05/19/2015 0.0  0 - 0.1 K/uL Final  . Sodium 05/19/2015 141  135 - 145 mmol/L Final  . Potassium 05/19/2015 4.3  3.5 - 5.1 mmol/L Final  . Chloride 05/19/2015 98* 101 - 111 mmol/L Final  . CO2 05/19/2015 38* 22 - 32 mmol/L Final  . Glucose, Bld 05/19/2015 132* 65 - 99 mg/dL Final  . BUN 05/19/2015 28* 6 - 20 mg/dL Final  . Creatinine, Ser 05/19/2015 1.14  0.61 - 1.24 mg/dL Final  . Calcium 05/19/2015 8.8* 8.9 - 10.3 mg/dL Final  . Total Protein 05/19/2015 7.0  6.5 - 8.1 g/dL Final  . Albumin 05/19/2015 4.0  3.5 - 5.0 g/dL Final  . AST 05/19/2015 17  15 - 41 U/L Final  . ALT  05/19/2015 17  17 - 63 U/L Final  . Alkaline Phosphatase 05/19/2015 59  38 - 126  U/L Final  . Total Bilirubin 05/19/2015 0.6  0.3 - 1.2 mg/dL Final  . GFR calc non Af Amer 05/19/2015 >60  >60 mL/min Final  . GFR calc Af Amer 05/19/2015 >60  >60 mL/min Final   Comment: (NOTE) The eGFR has been calculated using the CKD EPI equation. This calculation has not been validated in all clinical situations. eGFR's persistently <60 mL/min signify possible Chronic Kidney Disease.   . Anion gap 05/19/2015 5  5 - 15 Final      STUDIES: Nm Pet Image Restag (ps) Skull Base To Thigh  05/05/2015   CLINICAL DATA:  Subsequent treatment strategy for Lung cancer.  EXAM: NUCLEAR MEDICINE PET SKULL BASE TO THIGH  TECHNIQUE: 12.96 mCi F-18 FDG was injected intravenously. Full-ring PET imaging was performed from the skull base to thigh after the radiotracer. CT data was obtained and used for attenuation correction and anatomic localization.  FASTING BLOOD GLUCOSE:  Value: 109 mg/dl  COMPARISON:  12/13/2014  FINDINGS: NECK  No hypermetabolic lymph nodes in the neck.  CHEST  No hypermetabolic mediastinal or hilar lymph nodes. Tumor within the posterior and medial right upper lobe measures 5.5 cm and has an SUV max equal to 2.84. Previously this measured 5.6 cm and had an SUV max equal to 2.58. Previously referenced lingular opacity measures 1.6 cm, image 116 of/series 3. Unchanged from previous exam. No new hypermetabolic pulmonary nodules are mass is noted.  ABDOMEN/PELVIS  No abnormal hypermetabolic activity within the liver, pancreas, adrenal glands, or spleen. No hypermetabolic lymph nodes in the abdomen or pelvis. Prominent upper abdominal lymph nodes are again noted. Index periaortic lymph node measures 1.4 cm, image 171/series 3. Previously 1.2 cm. The index left periaortic lymph node measures 1.3 cm, image 182/series 3. Previously 1 cm. Right kidney mass is is difficult to visualized due to lack of IV contrast  material. This exhibits intense FDG uptake within SUV max equal to 10.9. Previously 6.7.  SKELETON  No focal hypermetabolic activity to suggest skeletal metastasis.  IMPRESSION: 1. Stable size and degree of FDG uptake associated with the posterior right upper lobe tumor. 2. Mild increase in size of retroperitoneal adenopathy. This is likely related to patient's known lymphoma/ leukemia. 3. Lingular consolidation and volume loss is stable from previous exam. 4. Right kidney mass exhibits increased FDG uptake which has progressed from previous study. This would be better assessed with followup contrast enhanced MR of the kidneys.   Electronically Signed   By: Kerby Moors M.D.   On: 05/05/2015 12:22    ASSESSMENT: CARCINOMA of lung stage IV disease on NIVOLULAMAB stable disease based on CT scan and a PET scan Carcinoma of kidney Retroperitoneal lymphadenopathy low-grade lymphoma  COPD MEDICAL DECISION MAKING:  All lab data has been reviewed.  PET scan has been reviewed independently. Lung mass has been stable Increase in retroperitoneal lymphadenopathy Increased activity with a kidney mass In view of PET scan showing some progressive retroperitoneal lymphadenopathy and progressive enlargement of kidney mass we would hold off on further therapy at present time. Continue observing patient and symptomatic management at present time Patient understood that patient has been taken off all treatment and can continue supportive therapy   Lung cancer, upper lobe   Staging form: Lung, AJCC 7th Edition     Clinical stage from 06/15/2010: yT2, N2, M0 - Signed by [name], NP on 02/03/2015     Pathologic stage from 04/04/2014: yT2, N2, M1 - Signed by [name], NP  on 02/03/2015 Lymphoma, small lymphocytic   Staging form: Lymphoid Neoplasms, AJCC 6th Edition     Clinical stage from 10/15/2013: Stage II - Signed by [name], NP on 02/03/2015 Renal cell cancer   Staging form: Kidney, AJCC  7th Edition     Clinical stage from 02/03/2015: Stage I (yT1a, N0, M0) - Signed by [name], NP on 02/03/2015   Forest Gleason, MD   05/24/2015 1:37 PM

## 2015-06-07 ENCOUNTER — Telehealth: Payer: Self-pay | Admitting: *Deleted

## 2015-06-07 DIAGNOSIS — C3492 Malignant neoplasm of unspecified part of left bronchus or lung: Secondary | ICD-10-CM

## 2015-06-07 MED ORDER — HYDROCODONE-ACETAMINOPHEN 5-325 MG PO TABS: 1.0000 | ORAL_TABLET | Freq: Four times a day (QID) | ORAL | Status: DC | PRN

## 2015-06-07 NOTE — Telephone Encounter (Signed)
Informed that prescription is ready to pick up  

## 2015-06-16 ENCOUNTER — Inpatient Hospital Stay: Payer: Federal, State, Local not specified - PPO | Attending: Oncology

## 2015-06-16 ENCOUNTER — Ambulatory Visit
Admission: RE | Admit: 2015-06-16 | Discharge: 2015-06-16 | Disposition: A | Discharge status: Home / Self Care | Payer: Federal, State, Local not specified - PPO | Source: Ambulatory Visit | Attending: Oncology | Admitting: Oncology

## 2015-06-16 ENCOUNTER — Inpatient Hospital Stay (HOSPITAL_BASED_OUTPATIENT_CLINIC_OR_DEPARTMENT_OTHER): Payer: Federal, State, Local not specified - PPO | Admitting: Oncology

## 2015-06-16 ENCOUNTER — Encounter: Payer: Self-pay | Admitting: Oncology

## 2015-06-16 ENCOUNTER — Ambulatory Visit
Admission: RE | Admit: 2015-06-16 | Discharge: 2015-06-16 | Disposition: A | Discharge status: Home / Self Care | Payer: Federal, State, Local not specified - PPO | Source: Ambulatory Visit | Attending: Family Medicine | Admitting: Family Medicine

## 2015-06-16 VITALS — BP 142/82 | HR 111 | Temp 97.6°F | Wt 153.7 lb

## 2015-06-16 DIAGNOSIS — Z8639 Personal history of other endocrine, nutritional and metabolic disease: Secondary | ICD-10-CM | POA: Diagnosis not present

## 2015-06-16 DIAGNOSIS — Z85038 Personal history of other malignant neoplasm of large intestine: Secondary | ICD-10-CM | POA: Diagnosis not present

## 2015-06-16 DIAGNOSIS — Z79899 Other long term (current) drug therapy: Secondary | ICD-10-CM

## 2015-06-16 DIAGNOSIS — C9110 Chronic lymphocytic leukemia of B-cell type not having achieved remission: Secondary | ICD-10-CM

## 2015-06-16 DIAGNOSIS — Z8673 Personal history of transient ischemic attack (TIA), and cerebral infarction without residual deficits: Secondary | ICD-10-CM | POA: Diagnosis not present

## 2015-06-16 DIAGNOSIS — C3410 Malignant neoplasm of upper lobe, unspecified bronchus or lung: Secondary | ICD-10-CM | POA: Diagnosis present

## 2015-06-16 DIAGNOSIS — C649 Malignant neoplasm of unspecified kidney, except renal pelvis: Secondary | ICD-10-CM | POA: Diagnosis not present

## 2015-06-16 DIAGNOSIS — Z87891 Personal history of nicotine dependence: Secondary | ICD-10-CM | POA: Diagnosis not present

## 2015-06-16 DIAGNOSIS — Z8719 Personal history of other diseases of the digestive system: Secondary | ICD-10-CM

## 2015-06-16 DIAGNOSIS — Z9221 Personal history of antineoplastic chemotherapy: Secondary | ICD-10-CM | POA: Insufficient documentation

## 2015-06-16 DIAGNOSIS — Z8611 Personal history of tuberculosis: Secondary | ICD-10-CM | POA: Diagnosis not present

## 2015-06-16 DIAGNOSIS — J449 Chronic obstructive pulmonary disease, unspecified: Secondary | ICD-10-CM | POA: Diagnosis not present

## 2015-06-16 DIAGNOSIS — R05 Cough: Secondary | ICD-10-CM | POA: Diagnosis present

## 2015-06-16 DIAGNOSIS — C3411 Malignant neoplasm of upper lobe, right bronchus or lung: Secondary | ICD-10-CM | POA: Diagnosis not present

## 2015-06-16 DIAGNOSIS — I10 Essential (primary) hypertension: Secondary | ICD-10-CM | POA: Insufficient documentation

## 2015-06-16 DIAGNOSIS — E119 Type 2 diabetes mellitus without complications: Secondary | ICD-10-CM | POA: Diagnosis not present

## 2015-06-16 LAB — BASIC METABOLIC PANEL
ANION GAP: 6 (ref 5–15)
BUN: 26 mg/dL — ABNORMAL HIGH (ref 6–20)
CHLORIDE: 96 mmol/L — AB (ref 101–111)
CO2: 36 mmol/L — ABNORMAL HIGH (ref 22–32)
Calcium: 8.9 mg/dL (ref 8.9–10.3)
Creatinine, Ser: 1.3 mg/dL — ABNORMAL HIGH (ref 0.61–1.24)
GFR calc non Af Amer: 56 mL/min — ABNORMAL LOW (ref >60)
Glucose, Bld: 168 mg/dL — ABNORMAL HIGH (ref 65–99)
Potassium: 3.9 mmol/L (ref 3.5–5.1)
SODIUM: 138 mmol/L (ref 135–145)

## 2015-06-16 LAB — CBC WITH DIFFERENTIAL/PLATELET
BASOS ABS: 0 10*3/uL (ref 0–0.1)
Basophils Relative: 1 %
EOS ABS: 0.3 10*3/uL (ref 0–0.7)
Eosinophils Relative: 3 %
HEMATOCRIT: 34.2 % — AB (ref 40.0–52.0)
HEMOGLOBIN: 11.1 g/dL — AB (ref 13.0–18.0)
Lymphocytes Relative: 32 %
Lymphs Abs: 2.7 10*3/uL (ref 1.0–3.6)
MCH: 27.8 pg (ref 26.0–34.0)
MCHC: 32.6 g/dL (ref 32.0–36.0)
MCV: 85.2 fL (ref 80.0–100.0)
Monocytes Absolute: 0.4 10*3/uL (ref 0.2–1.0)
Monocytes Relative: 5 %
NEUTROS ABS: 5 10*3/uL (ref 1.4–6.5)
NEUTROS PCT: 59 %
Platelets: 215 10*3/uL (ref 150–440)
RBC: 4.01 MIL/uL — AB (ref 4.40–5.90)
RDW: 15.3 % — ABNORMAL HIGH (ref 11.5–14.5)
WBC: 8.4 10*3/uL (ref 3.8–10.6)

## 2015-06-16 NOTE — Progress Notes (Signed)
Patient does have living will.  Former smoker.

## 2015-06-17 ENCOUNTER — Encounter: Payer: Self-pay | Admitting: Oncology

## 2015-06-17 NOTE — Progress Notes (Signed)
Informed that prescription is ready to pick up  [place] @ [facility] [phone]  Fax:(336) [place]. OB: [birth year]  MR#: [id number]  TOI#:[id number]  Patient Care Team: [name], MD as PCP - General (Family Medicine)  CHIEF COMPLAINT:  Chief Complaint  Patient presents with  . Follow-up    Oncology History   1. Right upper lobe lung mass biopsies positive for adenocarcinoma.   Based on PET scan patient has T2, N2, M0 tumor stage III a 2. Poor pulmonary function 3. Patient was started on carboplatinum Alimta and Avastin   on april  9th of 2013 4. Patient had an allergic reaction to carboplatinum with acute bronchospasm in May of 2013.  Patient is now being taken off carboplatinum. 5. Started on maintenance Alimta and Avastin  from June of 2013 hold chemotherapy because of increasing shortness of breath (in July, 2013) 6.VARISTRAT test is good started on Tarceva February 3 about 2014 diarrhea 4 days after Tarceva was started.  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 9.bnormal CT and MRI scan of the right kidneywith solid massin the upper pole right kidney 10.biopsy of renal masses consistent with primary renal cell cancer 11.  Patient started on  [facility] August 09, 2014 12 NIVOLULAMAB has been put on hold from August of 2016 because of   PROGRESSIVE  respiratory difficulty     Lung cancer, upper lobe   02/03/2015 Initial Diagnosis Lung cancer, upper lobe    Oncology Flowsheet 02/14/2015 02/28/2015 03/24/2015 04/14/2015 04/28/2015  nivolumab (OPDIVO) IV 200 mg 200 mg 200 mg 200 mg 200 mg    INTERVAL HISTORY: 64 year old gentleman with multiple malignancy presently being treated for recurrent carcinoma of lung.  On oxygen.  No diarrhea.  No rash.  Patient has been gradually improving.  Shortness of breath is  improved.  No nausea or vomiting.  Appetite is improved.  Here for further follow-up and treatment consideration . May 19, 2015 Recent has been gradually getting off prednisone on therapy.  Congestion is improved.  But performance status continues to remain poor. Shortness of breath persists cough persists.  Appetite has been stable.  No chills.  No fever.  No hemoptysis or chest pain June 16, 2015 Patient is here for ongoing evaluation regarding carcinoma of lung recurrent and progressive disease patient is off all chemotherapy.  Lymphoma and carcinoma of kidney.  Patient is in wheelchair on oxygen.  On small doses of prednisone.  Patient general condition is improved.  Cough and shortness of breath is better.  Appetite has been stable. REVIEW OF SYSTEMS:   Gen. status: Patient's performance status is stable Lungs: Increasing shortness of breath.  Cough.  Yellowish expectoration.  No chills or fever Cardiac: No chest pain or paroxysmal nocturnal dyspnea Abdomen: No nausea no vomiting no diarrhea GU: No hematuria dysuria Lower extremity no swelling Skin: No rash Neurological system no headache no dizziness Pain has improved taking Vicodin  As per HPI. Otherwise, a complete review of systems is negatve.  PAST MEDICAL HISTORY: Past Medical History  Diagnosis Date  . Lung cancer     right upper lobe mass positive for adenocarcinoma  . Cancer   . Renal cancer   . Lymphoma     low grade  . COPD (chronic obstructive pulmonary disease)   . Stroke   . Hypertension   .  Diabetes mellitus without complication   . Respiratory failure 07/12/2010    acute  . Colon cancer   . Tuberculosis   . Gout   . Glaucoma   . Pancreatitis   . Cholecystitis     PAST SURGICAL HIST0RY   COPD:    acute resp failure: 12-Jul-2010   lung ca:    CVA/Stroke:    HTN:    Diabetes Mellitus, Type II (NIDD):   Smoking History: Smoking History 3(1)quit Sept. 2011(1)  PFSH: Comments: history  of colon cancer, doubt, diabetes, tuberculosis  Comments: has been a chronic smoker for more than 25 years pack a day history does not drink.  Works in the post [employer]  Additional Past Medical and Surgical History: diabetes, type II,.  Hypertensin, history of stroke, chronic obstructive pulmonary disease,.  glaucoma.  History of pancreatitis and cholecystitis       ADVANCED DIRECTIVES: Patient does have advanced care directive   HEALTH MAINTENANCE: Social History  Substance Use Topics  . Smoking status: Former Smoker -- 1.00 packs/day for 25 years    Quit date: 01/13/2010  . Smokeless tobacco: None  . Alcohol Use: No       Allergies  Allergen Reactions  . Iodine Shortness Of Breath, Diarrhea and Nausea And Vomiting  . Nexium [Esomeprazole Magnesium] Other (See Comments)    headache    Current Outpatient Prescriptions  Medication Sig Dispense Refill  . albuterol (PROVENTIL HFA;VENTOLIN HFA) 108 (90 BASE) MCG/ACT inhaler Inhale 2 puffs into the lungs every 6 (six) hours as needed for wheezing or shortness of breath.    Marland Kitchen albuterol (PROVENTIL) (2.5 MG/3ML) 0.083% nebulizer solution USE 1 VIAL IN NEBULIZER EVERY 3-4 HOURS AS NEEDED 75 mL 3  . budesonide-formoterol (SYMBICORT) 160-4.5 MCG/ACT inhaler Inhale 2 puffs into the lungs 2 (two) times daily.    . diphenhydrAMINE (BENADRYL) 12.5 MG chewable tablet Chew 25 mg by mouth daily as needed for allergies.    Marland Kitchen glipiZIDE (GLUCOTROL) 10 MG tablet Take 10 mg by mouth daily before breakfast.    . HYDROcodone-acetaminophen (NORCO/VICODIN) 5-325 MG per tablet Take 1-2 tablets by mouth every 6 (six) hours as needed for moderate pain. 90 tablet 0  . ipratropium-albuterol (DUONEB) 0.5-2.5 (3) MG/3ML SOLN Take 3 mLs by nebulization every 6 (six) hours as needed (shortness of breath).    . latanoprost (XALATAN) 0.005 % ophthalmic solution     . loperamide (IMODIUM A-D) 2 MG tablet Take 2 mg by mouth 4 (four) times daily as needed for diarrhea  or loose stools.    Marland Kitchen losartan-hydrochlorothiazide (HYZAAR) 50-12.5 MG per tablet Take 1 tablet by mouth daily.    . pioglitazone (ACTOS) 30 MG tablet Take 30 mg by mouth daily.    . predniSONE (DELTASONE) 10 MG tablet Take 1 tablet (10 mg total) by mouth daily. 30 tablet 3  . temazepam (RESTORIL) 7.5 MG capsule Take 1-2 capsules by mouth at bedtime as needed.  2  . theophylline (UNIPHYL) 400 MG 24 hr tablet     . tiotropium (SPIRIVA) 18 MCG inhalation capsule Place 18 mcg into inhaler and inhale daily.    Marland Kitchen zolpidem (AMBIEN) 10 MG tablet Take 10 mg by mouth at bedtime as needed for sleep.    . Travoprost, BAK Free, (TRAVATAN) 0.004 % SOLN ophthalmic solution 1 drop at bedtime.     No current facility-administered medications for this visit.   Facility-Administered Medications Ordered in Other Visits  Medication Dose Route Frequency Provider Last Rate Last  Dose  . sodium chloride 0.9 % injection 10 mL  10 mL Intracatheter PRN Forest Gleason, MD   10 mL at 04/28/15 1410  . sodium chloride 0.9 % injection 10 mL  10 mL Intravenous PRN Forest Gleason, MD   10 mL at 05/12/15 1350    OBJECTIVE:  Filed Vitals:   06/16/15 1057  BP: 142/82  Pulse: 111  Temp: 97.6 F (36.4 C)     Body mass index is 23.37 kg/(m^2).    ECOG FS:1 - Symptomatic but completely ambulatory  PHYSICAL EXAM:  Gen. status: Patient is alert oriented not any acute distress using oxygen. HEENT: No evidence of stomatitis. Lymphatic system: No palpable supraclavicular cervical axillary lymphadenopathy Lungs: Diminished at [facility] both sides O rhonchi coarse crepitation Bilateral rhonchi Cardiac: Soft systolic murmur.  Tachycardia Abdominal exam revealed normal bowel sounds. The abdomen was soft, non-tender, and without masses, organomegaly, or appreciable enlargement of the abdominal aorta. Skin: No ecchymosis no rash Neurological system no localizing sign All other systems has been examined no abnormality detected  LAB  RESULTS:  Appointment on 06/16/2015  Component Date Value Ref Range Status  . WBC 06/16/2015 8.4  3.8 - 10.6 K/uL Final  . RBC 06/16/2015 4.01* 4.40 - 5.90 MIL/uL Final  . Hemoglobin 06/16/2015 11.1* 13.0 - 18.0 g/dL Final  . HCT 06/16/2015 34.2* 40.0 - 52.0 % Final  . MCV 06/16/2015 85.2  80.0 - 100.0 fL Final  . MCH 06/16/2015 27.8  26.0 - 34.0 pg Final  . MCHC 06/16/2015 32.6  32.0 - 36.0 g/dL Final  . RDW 06/16/2015 15.3* 11.5 - 14.5 % Final  . Platelets 06/16/2015 215  150 - 440 K/uL Final  . Neutrophils Relative % 06/16/2015 59   Final  . Neutro Abs 06/16/2015 5.0  1.4 - 6.5 K/uL Final  . Lymphocytes Relative 06/16/2015 32   Final  . Lymphs Abs 06/16/2015 2.7  1.0 - 3.6 K/uL Final  . Monocytes Relative 06/16/2015 5   Final  . Monocytes Absolute 06/16/2015 0.4  0.2 - 1.0 K/uL Final  . Eosinophils Relative 06/16/2015 3   Final  . Eosinophils Absolute 06/16/2015 0.3  0 - 0.7 K/uL Final  . Basophils Relative 06/16/2015 1   Final  . Basophils Absolute 06/16/2015 0.0  0 - 0.1 K/uL Final  . Sodium 06/16/2015 138  135 - 145 mmol/L Final  . Potassium 06/16/2015 3.9  3.5 - 5.1 mmol/L Final  . Chloride 06/16/2015 96* 101 - 111 mmol/L Final  . CO2 06/16/2015 36* 22 - 32 mmol/L Final  . Glucose, Bld 06/16/2015 168* 65 - 99 mg/dL Final  . BUN 06/16/2015 26* 6 - 20 mg/dL Final  . Creatinine, Ser 06/16/2015 1.30* 0.61 - 1.24 mg/dL Final  . Calcium 06/16/2015 8.9  8.9 - 10.3 mg/dL Final  . GFR calc non Af Amer 06/16/2015 56* >60 mL/min Final  . GFR calc Af Amer 06/16/2015 >60  >60 mL/min Final   Comment: (NOTE) The eGFR has been calculated using the CKD EPI equation. This calculation has not been validated in all clinical situations. eGFR's persistently <60 mL/min signify possible Chronic Kidney Disease.   . Anion gap 06/16/2015 6  5 - 15 Final      STUDIES: Dg Chest 2 View  06/16/2015   CLINICAL DATA:  Routine follow-up, history of right upper lobe malignancy, also history of renal  and colonic malignancy, COPD, previous tuberculous infection, on home oxygen  EXAM: CHEST  2 VIEW  COMPARISON:  PA  and lateral chest x-ray of April 14, 2015.  FINDINGS: The lungs remain hyperinflated. Stable scarring in the right upper lobe is demonstrated. The heart and pulmonary vascularity are normal. The Port-A-Cath appliance is stable. There are stable sclerotic foci in the posterior aspects of the right sixth and seventh ribs.  IMPRESSION: COPD with chronic changes as described. There is no acute cardiopulmonary abnormality.   Electronically Signed   By: David  Martinique M.D.   On: 06/16/2015 11:07    ASSESSMENT: CARCINOMA of lung stage IV disease on NIVOLULAMAB stable disease based on CT scan and a PET scan Carcinoma of kidney Retroperitoneal lymphadenopathy low-grade lymphoma Chest x-ray: Shows a stable disease.  No acute cardiopulmonary abnormality  COPD MEDICAL DECISION MAKING:  All lab data has been reviewed.  PET scan has been reviewed independently. Lung mass has been stable Increase in retroperitoneal lymphadenopathy Increased activity with a kidney mass In view of progressing multiple other malignancy will continue to hold off any further chemotherapy    Lung cancer, upper lobe   Staging form: Lung, AJCC 7th Edition     Clinical stage from 06/15/2010: yT2, N2, M0 - Signed by [name], NP on 02/03/2015     Pathologic stage from 04/04/2014: yT2, N2, M1 - Signed by [name], NP on 02/03/2015 Lymphoma, small lymphocytic   Staging form: Lymphoid Neoplasms, AJCC 6th Edition     Clinical stage from 10/15/2013: Stage II - Signed by [name], NP on 02/03/2015 Renal cell cancer   Staging form: Kidney, AJCC 7th Edition     Clinical stage from 02/03/2015: Stage I (yT1a, N0, M0) - Signed by [name], NP on 02/03/2015   Forest Gleason, MD   06/17/2015 3:30 PM

## 2015-06-23 ENCOUNTER — Telehealth: Payer: Self-pay | Admitting: *Deleted

## 2015-06-23 MED ORDER — AMOXICILLIN-POT CLAVULANATE 875-125 MG PO TABS: 1.0000 | ORAL_TABLET | Freq: Two times a day (BID) | ORAL | Status: DC

## 2015-06-23 NOTE — Telephone Encounter (Signed)
Informed abx sent to pharmacy to take twice a day for 7 days and to call back if does not improve or gets worse. Verbalized understanding

## 2015-06-23 NOTE — Telephone Encounter (Signed)
Requesting an antibiotic be called in for him. He has congestion of yellow green sputum, having difficulty breathing, denies fever. Uses [facility]

## 2015-06-29 ENCOUNTER — Other Ambulatory Visit: Payer: Self-pay | Admitting: *Deleted

## 2015-06-29 DIAGNOSIS — C3492 Malignant neoplasm of unspecified part of left bronchus or lung: Secondary | ICD-10-CM

## 2015-06-29 MED ORDER — HYDROCODONE-ACETAMINOPHEN 5-325 MG PO TABS: 1.0000 | ORAL_TABLET | Freq: Four times a day (QID) | ORAL | Status: DC | PRN

## 2015-06-29 NOTE — Telephone Encounter (Signed)
Informed that prescription is ready to pick up Informed pt that we are giving him enough to last until his next appt when the doctor wants to discuss changing him to a long acting pain med. He was agreeable to this plan

## 2015-06-29 NOTE — Telephone Encounter (Signed)
He is using #90 every 3 weeks do you want to change the quantity dispensed?

## 2015-07-04 ENCOUNTER — Other Ambulatory Visit: Payer: Self-pay | Admitting: Oncology

## 2015-07-14 ENCOUNTER — Inpatient Hospital Stay: Payer: Federal, State, Local not specified - PPO | Admitting: Oncology

## 2015-07-14 ENCOUNTER — Ambulatory Visit: Payer: Federal, State, Local not specified - PPO | Admitting: Oncology

## 2015-07-14 ENCOUNTER — Inpatient Hospital Stay: Payer: Federal, State, Local not specified - PPO

## 2015-07-19 ENCOUNTER — Telehealth: Payer: Self-pay | Admitting: *Deleted

## 2015-07-19 DIAGNOSIS — C3492 Malignant neoplasm of unspecified part of left bronchus or lung: Secondary | ICD-10-CM

## 2015-07-19 MED ORDER — HYDROCODONE-ACETAMINOPHEN 5-325 MG PO TABS: 1.0000 | ORAL_TABLET | Freq: Four times a day (QID) | ORAL | Status: DC | PRN

## 2015-07-19 NOTE — Telephone Encounter (Signed)
Informed that prescription is ready to pick up  

## 2015-08-04 ENCOUNTER — Inpatient Hospital Stay: Payer: Federal, State, Local not specified - PPO | Attending: Oncology | Admitting: Oncology

## 2015-08-04 ENCOUNTER — Encounter: Payer: Self-pay | Admitting: Oncology

## 2015-08-04 ENCOUNTER — Inpatient Hospital Stay: Payer: Federal, State, Local not specified - PPO

## 2015-08-04 VITALS — BP 119/79 | HR 107 | Temp 95.9°F | Wt 159.2 lb

## 2015-08-04 DIAGNOSIS — I10 Essential (primary) hypertension: Secondary | ICD-10-CM | POA: Insufficient documentation

## 2015-08-04 DIAGNOSIS — Z8639 Personal history of other endocrine, nutritional and metabolic disease: Secondary | ICD-10-CM | POA: Diagnosis not present

## 2015-08-04 DIAGNOSIS — E119 Type 2 diabetes mellitus without complications: Secondary | ICD-10-CM | POA: Insufficient documentation

## 2015-08-04 DIAGNOSIS — C9110 Chronic lymphocytic leukemia of B-cell type not having achieved remission: Secondary | ICD-10-CM | POA: Diagnosis not present

## 2015-08-04 DIAGNOSIS — J449 Chronic obstructive pulmonary disease, unspecified: Secondary | ICD-10-CM | POA: Diagnosis not present

## 2015-08-04 DIAGNOSIS — C3492 Malignant neoplasm of unspecified part of left bronchus or lung: Secondary | ICD-10-CM

## 2015-08-04 DIAGNOSIS — Z79899 Other long term (current) drug therapy: Secondary | ICD-10-CM | POA: Insufficient documentation

## 2015-08-04 DIAGNOSIS — Z7952 Long term (current) use of systemic steroids: Secondary | ICD-10-CM | POA: Diagnosis not present

## 2015-08-04 DIAGNOSIS — Z8719 Personal history of other diseases of the digestive system: Secondary | ICD-10-CM | POA: Diagnosis not present

## 2015-08-04 DIAGNOSIS — Z87891 Personal history of nicotine dependence: Secondary | ICD-10-CM | POA: Diagnosis not present

## 2015-08-04 DIAGNOSIS — Z9221 Personal history of antineoplastic chemotherapy: Secondary | ICD-10-CM | POA: Insufficient documentation

## 2015-08-04 DIAGNOSIS — C3411 Malignant neoplasm of upper lobe, right bronchus or lung: Secondary | ICD-10-CM

## 2015-08-04 DIAGNOSIS — C779 Secondary and unspecified malignant neoplasm of lymph node, unspecified: Secondary | ICD-10-CM | POA: Diagnosis not present

## 2015-08-04 DIAGNOSIS — C3410 Malignant neoplasm of upper lobe, unspecified bronchus or lung: Secondary | ICD-10-CM

## 2015-08-04 DIAGNOSIS — Z85038 Personal history of other malignant neoplasm of large intestine: Secondary | ICD-10-CM | POA: Insufficient documentation

## 2015-08-04 DIAGNOSIS — Z8673 Personal history of transient ischemic attack (TIA), and cerebral infarction without residual deficits: Secondary | ICD-10-CM | POA: Insufficient documentation

## 2015-08-04 DIAGNOSIS — C787 Secondary malignant neoplasm of liver and intrahepatic bile duct: Secondary | ICD-10-CM | POA: Insufficient documentation

## 2015-08-04 DIAGNOSIS — Z23 Encounter for immunization: Secondary | ICD-10-CM | POA: Insufficient documentation

## 2015-08-04 DIAGNOSIS — Z8611 Personal history of tuberculosis: Secondary | ICD-10-CM | POA: Insufficient documentation

## 2015-08-04 DIAGNOSIS — C649 Malignant neoplasm of unspecified kidney, except renal pelvis: Secondary | ICD-10-CM | POA: Insufficient documentation

## 2015-08-04 LAB — COMPREHENSIVE METABOLIC PANEL
ALT: 12 U/L — ABNORMAL LOW (ref 17–63)
AST: 27 U/L (ref 15–41)
Albumin: 4.1 g/dL (ref 3.5–5.0)
Alkaline Phosphatase: 61 U/L (ref 38–126)
Anion gap: 6 (ref 5–15)
BUN: 27 mg/dL — ABNORMAL HIGH (ref 6–20)
CO2: 34 mmol/L — ABNORMAL HIGH (ref 22–32)
Calcium: 8.9 mg/dL (ref 8.9–10.3)
Chloride: 98 mmol/L — ABNORMAL LOW (ref 101–111)
Creatinine, Ser: 1.24 mg/dL (ref 0.61–1.24)
GFR, EST NON AFRICAN AMERICAN: 60 mL/min — AB (ref >60)
Glucose, Bld: 229 mg/dL — ABNORMAL HIGH (ref 65–99)
POTASSIUM: 3.5 mmol/L (ref 3.5–5.1)
Sodium: 138 mmol/L (ref 135–145)
Total Bilirubin: 0.3 mg/dL (ref 0.3–1.2)
Total Protein: 7.1 g/dL (ref 6.5–8.1)

## 2015-08-04 LAB — CBC WITH DIFFERENTIAL/PLATELET
Basophils Absolute: 0 10*3/uL (ref 0–0.1)
Basophils Relative: 0 %
EOS ABS: 0.3 10*3/uL (ref 0–0.7)
EOS PCT: 4 %
HCT: 32.3 % — ABNORMAL LOW (ref 40.0–52.0)
Hemoglobin: 10.3 g/dL — ABNORMAL LOW (ref 13.0–18.0)
LYMPHS ABS: 3.7 10*3/uL — AB (ref 1.0–3.6)
Lymphocytes Relative: 51 %
MCH: 27.1 pg (ref 26.0–34.0)
MCHC: 31.9 g/dL — AB (ref 32.0–36.0)
MCV: 85 fL (ref 80.0–100.0)
MONO ABS: 0.4 10*3/uL (ref 0.2–1.0)
MONOS PCT: 5 %
Neutro Abs: 2.9 10*3/uL (ref 1.4–6.5)
Neutrophils Relative %: 40 %
PLATELETS: 219 10*3/uL (ref 150–440)
RBC: 3.8 MIL/uL — AB (ref 4.40–5.90)
RDW: 15.1 % — ABNORMAL HIGH (ref 11.5–14.5)
WBC: 7.3 10*3/uL (ref 3.8–10.6)

## 2015-08-04 MED ORDER — PREDNISONE 10 MG PO TABS
10.0000 mg | ORAL_TABLET | Freq: Every day | ORAL | Status: DC
Start: 2015-08-04 — End: 2015-10-27

## 2015-08-04 MED ORDER — INFLUENZA VAC SPLIT QUAD 0.5 ML IM SUSY
0.5000 mL | PREFILLED_SYRINGE | Freq: Once | INTRAMUSCULAR | Status: AC
  Administered 2015-08-04: 0.5 mL via INTRAMUSCULAR

## 2015-08-04 MED ORDER — HYDROCODONE-ACETAMINOPHEN 5-325 MG PO TABS: 1.0000 | ORAL_TABLET | Freq: Four times a day (QID) | ORAL | Status: DC | PRN

## 2015-08-04 NOTE — Progress Notes (Signed)
Informed that prescription is ready to pick up  [place] @ [facility] [phone]  Fax:(336) [place]. OB: 01-03-1951  MR#: [id number]  ZJI#:[id number]  Patient Care Team: [name], MD as PCP - General (Family Medicine)  CHIEF COMPLAINT:  Chief Complaint  Patient presents with  . OTHER    Oncology History   1. Right upper lobe lung mass biopsies positive for adenocarcinoma.   Based on PET scan patient has T2, N2, M0 tumor stage III a 2. Poor pulmonary function 3. Patient was started on carboplatinum Alimta and Avastin   on april  9th of 2013 4. Patient had an allergic reaction to carboplatinum with acute bronchospasm in May of 2013.  Patient is now being taken off carboplatinum. 5. Started on maintenance Alimta and Avastin  from June of 2013 hold chemotherapy because of increasing shortness of breath (in July, 2013) 6.VARISTRAT test is good started on Tarceva February 3 about 2014 diarrhea 4 days after Tarceva was started.  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 9.bnormal CT and MRI scan of the right kidneywith solid massin the upper pole right kidney 10.biopsy of renal masses consistent with primary renal cell cancer 11.  Patient started on  [facility] August 09, 2014 12 NIVOLULAMAB has been put on hold from August of 2016 because of   PROGRESSIVE  respiratory difficulty     Lung cancer, upper lobe ([place])   02/03/2015 Initial Diagnosis Lung cancer, upper lobe    Oncology Flowsheet 02/14/2015 02/28/2015 03/24/2015 04/14/2015 04/28/2015  nivolumab (OPDIVO) IV 200 mg 200 mg 200 mg 200 mg 200 mg    INTERVAL HISTORY:   .  64 year old African-American [name] came today for further follow-up regarding carcinoma of lung, carcinoma of kidney and liver lymphoma involving retroperitoneal lymph nodes Patient is off  all chemotherapy at present time Continues to shortness of breath on oxygen no fever.  No chills.  Here for further follow-up and treatment consideration  REVIEW OF SYSTEMS:   Gen. status: Patient's performance status is stable in wheelchair Lungs: Increasing shortness of breath.  Cough.    No chills or fever Cardiac: No chest pain or paroxysmal nocturnal dyspnea Abdomen: No nausea no vomiting no diarrhea GU: No hematuria dysuria Lower extremity no swelling Skin: No rash Neurological system no headache no dizziness Pain has improved taking Vicodin  As per HPI. Otherwise, a complete review of systems is negatve.  PAST MEDICAL HISTORY: Past Medical History  Diagnosis Date  . Lung cancer ([place])     right upper lobe mass positive for adenocarcinoma  . Cancer ([place])   . Renal cancer ([place])   . Lymphoma ([facility])     low grade  . COPD (chronic obstructive pulmonary disease) ([place])   . Stroke ([place])   . Hypertension   . Diabetes mellitus without complication ([place])   . Respiratory failure ([place]) 07/12/2010    acute  . Colon cancer ([place])   . Tuberculosis   . Gout   . Glaucoma   . Pancreatitis   . Cholecystitis     PAST SURGICAL HIST0RY   COPD:    acute resp failure: 12-Jul-2010   lung ca:    CVA/Stroke:    HTN:    Diabetes Mellitus, Type II (NIDD):   Smoking History: Smoking History 3(1)quit Sept. 2011(1)  PFSH: Comments: history of colon cancer,  doubt, diabetes, tuberculosis  Comments: has been a chronic smoker for more than 25 years pack a day history does not drink.  Works in the post office  Additional Past Medical and Surgical History: diabetes, type II,.  Hypertensin, history of stroke, chronic obstructive pulmonary disease,.  glaucoma.  History of pancreatitis and cholecystitis       ADVANCED DIRECTIVES: Patient does have advanced care directive   HEALTH MAINTENANCE: Social History  Substance Use Topics  . Smoking status: Former Smoker -- 1.00 packs/day for 25  years    Quit date: 01/13/2010  . Smokeless tobacco: None  . Alcohol Use: No       Allergies  Allergen Reactions  . Iodine Shortness Of Breath, Diarrhea and Nausea And Vomiting  . Nexium [Esomeprazole Magnesium] Other (See Comments)    headache    Current Outpatient Prescriptions  Medication Sig Dispense Refill  . albuterol (PROVENTIL HFA;VENTOLIN HFA) 108 (90 BASE) MCG/ACT inhaler Inhale 2 puffs into the lungs every 6 (six) hours as needed for wheezing or shortness of breath.    Marland Kitchen albuterol (PROVENTIL) (2.5 MG/3ML) 0.083% nebulizer solution USE 1 VIAL IN NEBULIZER EVERY 3-4 HOURS AS NEEDED 75 mL 3  . amoxicillin-clavulanate (AUGMENTIN) 875-125 MG per tablet Take 1 tablet by mouth 2 (two) times daily. 14 tablet 0  . budesonide-formoterol (SYMBICORT) 160-4.5 MCG/ACT inhaler Inhale 2 puffs into the lungs 2 (two) times daily.    . diphenhydrAMINE (BENADRYL) 12.5 MG chewable tablet Chew 25 mg by mouth daily as needed for allergies.    Marland Kitchen glipiZIDE (GLUCOTROL) 10 MG tablet Take 10 mg by mouth daily before breakfast.    . HYDROcodone-acetaminophen (NORCO/VICODIN) 5-325 MG tablet Take 1-2 tablets by mouth every 6 (six) hours as needed for moderate pain. [place] tablet 0  . ipratropium-albuterol (DUONEB) 0.5-2.5 (3) MG/3ML SOLN Take 3 mLs by nebulization every 6 (six) hours as needed (shortness of breath).    . latanoprost (XALATAN) 0.005 % ophthalmic solution     . loperamide (IMODIUM A-D) 2 MG tablet Take 2 mg by mouth 4 (four) times daily as needed for diarrhea or loose stools.    Marland Kitchen losartan-hydrochlorothiazide (HYZAAR) 50-12.5 MG per tablet Take 1 tablet by mouth daily.    . pioglitazone (ACTOS) 30 MG tablet Take 30 mg by mouth daily.    . predniSONE (DELTASONE) 10 MG tablet Take 1 tablet (10 mg total) by mouth daily. 30 tablet 3  . temazepam (RESTORIL) 7.5 MG capsule Take 1-2 capsules by mouth at bedtime as needed.  2  . theophylline (UNIPHYL) 400 MG 24 hr tablet     . tiotropium (SPIRIVA) 18  MCG inhalation capsule Place 18 mcg into inhaler and inhale daily.    . Travoprost, BAK Free, (TRAVATAN) 0.004 % SOLN ophthalmic solution 1 drop at bedtime.    Marland Kitchen zolpidem (AMBIEN) 10 MG tablet Take 10 mg by mouth at bedtime as needed for sleep.     No current facility-administered medications for this visit.   Facility-Administered Medications Ordered in Other Visits  Medication Dose Route Frequency Provider Last Rate Last Dose  . sodium chloride 0.9 % injection 10 mL  10 mL Intracatheter PRN Forest Gleason, MD   10 mL at 04/28/15 1410  . sodium chloride 0.9 % injection 10 mL  10 mL Intravenous PRN Forest Gleason, MD   10 mL at 05/12/15 1350    OBJECTIVE:  Filed Vitals:   08/04/15 1006  BP: 119/79  Pulse: 107  Temp: 95.9 F (35.5 C)  Body mass index is 24.21 kg/(m^2).    ECOG FS:1 - Symptomatic but completely ambulatory  PHYSICAL EXAM:  Gen. status: Patient is alert oriented not any acute distress using oxygen. HEENT: No evidence of stomatitis. Lymphatic system: No palpable supraclavicular cervical axillary lymphadenopathy Lungs: Diminished at [facility] both sides O rhonchi coarse crepitation Bilateral rhonchi Cardiac: Soft systolic murmur.  Tachycardia Abdominal exam revealed normal bowel sounds. The abdomen was soft, non-tender, and without masses, organomegaly, or appreciable enlargement of the abdominal aorta. Skin: No ecchymosis no rash Neurological system no localizing sign All other systems has been examined no abnormality detected  LAB RESULTS:  Appointment on 08/04/2015  Component Date Value Ref Range Status  . WBC 08/04/2015 7.3  3.8 - 10.6 K/uL Final  . RBC 08/04/2015 3.80* 4.40 - 5.90 MIL/uL Final  . Hemoglobin 08/04/2015 10.3* 13.0 - 18.0 g/dL Final  . HCT 08/04/2015 32.3* 40.0 - 52.0 % Final  . MCV 08/04/2015 85.0  80.0 - 100.0 fL Final  . MCH 08/04/2015 27.1  26.0 - 34.0 pg Final  . MCHC 08/04/2015 31.9* 32.0 - 36.0 g/dL Final  . RDW 08/04/2015 15.1* 11.5 -  14.5 % Final  . Platelets 08/04/2015 219  150 - 440 K/uL Final  . Neutrophils Relative % 08/04/2015 40   Final  . Neutro Abs 08/04/2015 2.9  1.4 - 6.5 K/uL Final  . Lymphocytes Relative 08/04/2015 51   Final  . Lymphs Abs 08/04/2015 3.7* 1.0 - 3.6 K/uL Final  . Monocytes Relative 08/04/2015 5   Final  . Monocytes Absolute 08/04/2015 0.4  0.2 - 1.0 K/uL Final  . Eosinophils Relative 08/04/2015 4   Final  . Eosinophils Absolute 08/04/2015 0.3  0 - 0.7 K/uL Final  . Basophils Relative 08/04/2015 0   Final  . Basophils Absolute 08/04/2015 0.0  0 - 0.1 K/uL Final      STUDIES: No results found.  ASSESSMENT: CARCINOMA of lung stage IV disease on NIVOLULAMAB stable disease based on CT scan and a PET scan Carcinoma of kidney Retroperitoneal lymphadenopathy low-grade lymphoma Chest x-ray: Shows a stable disease.  No acute cardiopulmonary abnormality  COPD MEDICAL DECISION MAKING:  All lab data has been reviewed. Anemia multifactorial COPD, carcinoma of lung on oxygen oxygen saturation is stable We will continue prednisone Pain is well controlled with hydrocodone which will continue Repeat chest x-ray prior to next appointment Patient was advised to call me if spikes fever Flu vaccine was given today.     Lung cancer, upper lobe   Staging form: Lung, AJCC 7th Edition     Clinical stage from 06/15/2010: yT2, N2, M0 - Signed by [name], NP on 02/03/2015     Pathologic stage from 04/04/2014: yT2, N2, M1 - Signed by [name], NP on 02/03/2015 Lymphoma, small lymphocytic   Staging form: Lymphoid Neoplasms, AJCC 6th Edition     Clinical stage from 10/15/2013: Stage II - Signed by [name], NP on 02/03/2015 Renal cell cancer   Staging form: Kidney, AJCC 7th Edition     Clinical stage from 02/03/2015: Stage I (yT1a, N0, M0) - Signed by [name], NP on 02/03/2015   Forest Gleason, MD   08/04/2015 10:25 AM

## 2015-08-06 ENCOUNTER — Encounter: Payer: Self-pay | Admitting: Oncology

## 2015-08-16 ENCOUNTER — Other Ambulatory Visit: Payer: Self-pay | Admitting: Oncology

## 2015-08-19 ENCOUNTER — Telehealth: Payer: Self-pay | Admitting: *Deleted

## 2015-08-19 MED ORDER — GUAIFENESIN ER 600 MG PO TB12: 600.0000 mg | ORAL_TABLET | Freq: Two times a day (BID) | ORAL | Status: DC

## 2015-08-19 MED ORDER — AZITHROMYCIN 250 MG PO TABS: ORAL_TABLET | ORAL | Status: DC

## 2015-08-19 NOTE — Telephone Encounter (Signed)
Calle dto report he is congested more than usual and he is having nose bleeds

## 2015-08-19 NOTE — Telephone Encounter (Signed)
Advised to run a humidifier, take mucinex and zpak sent to pharmacy [facility] Patient states he will use his humidifier and pick up rx

## 2015-08-24 ENCOUNTER — Other Ambulatory Visit: Payer: Self-pay | Admitting: *Deleted

## 2015-08-24 DIAGNOSIS — C3492 Malignant neoplasm of unspecified part of left bronchus or lung: Secondary | ICD-10-CM

## 2015-08-24 MED ORDER — HYDROCODONE-ACETAMINOPHEN 5-325 MG PO TABS: 1.0000 | ORAL_TABLET | Freq: Four times a day (QID) | ORAL | Status: DC | PRN

## 2015-08-24 NOTE — Telephone Encounter (Signed)
Informed that prescription is ready to pick up  

## 2015-09-05 ENCOUNTER — Telehealth: Payer: Self-pay | Admitting: *Deleted

## 2015-09-05 MED ORDER — AZITHROMYCIN 250 MG PO TABS
ORAL_TABLET | ORAL | Status: DC
Start: 2015-09-05 — End: 2015-09-14

## 2015-09-05 NOTE — Telephone Encounter (Signed)
Asking for antibiotics for chest congestion. Coughing up yellow sputum. Denies fever

## 2015-09-05 NOTE — Telephone Encounter (Signed)
Z pak per Dr Rogue Bussing. E scribed pt informed

## 2015-09-14 ENCOUNTER — Ambulatory Visit
Admission: RE | Admit: 2015-09-14 | Discharge: 2015-09-14 | Disposition: A | Discharge status: Home / Self Care | Payer: Federal, State, Local not specified - PPO | Source: Ambulatory Visit | Attending: Oncology | Admitting: Oncology

## 2015-09-14 ENCOUNTER — Encounter: Payer: Self-pay | Admitting: Oncology

## 2015-09-14 ENCOUNTER — Inpatient Hospital Stay: Payer: Federal, State, Local not specified - PPO

## 2015-09-14 ENCOUNTER — Inpatient Hospital Stay: Payer: Federal, State, Local not specified - PPO | Attending: Oncology | Admitting: Oncology

## 2015-09-14 VITALS — BP 124/71 | HR 92 | Temp 96.2°F | Wt 163.0 lb

## 2015-09-14 DIAGNOSIS — Z8673 Personal history of transient ischemic attack (TIA), and cerebral infarction without residual deficits: Secondary | ICD-10-CM | POA: Insufficient documentation

## 2015-09-14 DIAGNOSIS — Z8781 Personal history of (healed) traumatic fracture: Secondary | ICD-10-CM | POA: Diagnosis not present

## 2015-09-14 DIAGNOSIS — M8580 Other specified disorders of bone density and structure, unspecified site: Secondary | ICD-10-CM

## 2015-09-14 DIAGNOSIS — Z8719 Personal history of other diseases of the digestive system: Secondary | ICD-10-CM | POA: Diagnosis not present

## 2015-09-14 DIAGNOSIS — R197 Diarrhea, unspecified: Secondary | ICD-10-CM

## 2015-09-14 DIAGNOSIS — Z9221 Personal history of antineoplastic chemotherapy: Secondary | ICD-10-CM | POA: Diagnosis not present

## 2015-09-14 DIAGNOSIS — Z87891 Personal history of nicotine dependence: Secondary | ICD-10-CM | POA: Diagnosis not present

## 2015-09-14 DIAGNOSIS — I10 Essential (primary) hypertension: Secondary | ICD-10-CM | POA: Insufficient documentation

## 2015-09-14 DIAGNOSIS — Z8611 Personal history of tuberculosis: Secondary | ICD-10-CM | POA: Diagnosis not present

## 2015-09-14 DIAGNOSIS — C8590 Non-Hodgkin lymphoma, unspecified, unspecified site: Secondary | ICD-10-CM

## 2015-09-14 DIAGNOSIS — C649 Malignant neoplasm of unspecified kidney, except renal pelvis: Secondary | ICD-10-CM

## 2015-09-14 DIAGNOSIS — Z85118 Personal history of other malignant neoplasm of bronchus and lung: Secondary | ICD-10-CM | POA: Insufficient documentation

## 2015-09-14 DIAGNOSIS — Z85038 Personal history of other malignant neoplasm of large intestine: Secondary | ICD-10-CM | POA: Diagnosis not present

## 2015-09-14 DIAGNOSIS — C3411 Malignant neoplasm of upper lobe, right bronchus or lung: Secondary | ICD-10-CM | POA: Insufficient documentation

## 2015-09-14 DIAGNOSIS — R0602 Shortness of breath: Secondary | ICD-10-CM | POA: Insufficient documentation

## 2015-09-14 DIAGNOSIS — C3492 Malignant neoplasm of unspecified part of left bronchus or lung: Secondary | ICD-10-CM

## 2015-09-14 DIAGNOSIS — J449 Chronic obstructive pulmonary disease, unspecified: Secondary | ICD-10-CM | POA: Insufficient documentation

## 2015-09-14 DIAGNOSIS — C801 Malignant (primary) neoplasm, unspecified: Secondary | ICD-10-CM

## 2015-09-14 DIAGNOSIS — Z79899 Other long term (current) drug therapy: Secondary | ICD-10-CM | POA: Diagnosis not present

## 2015-09-14 DIAGNOSIS — C3410 Malignant neoplasm of upper lobe, unspecified bronchus or lung: Secondary | ICD-10-CM

## 2015-09-14 DIAGNOSIS — E119 Type 2 diabetes mellitus without complications: Secondary | ICD-10-CM | POA: Diagnosis not present

## 2015-09-14 DIAGNOSIS — Z7984 Long term (current) use of oral hypoglycemic drugs: Secondary | ICD-10-CM | POA: Insufficient documentation

## 2015-09-14 DIAGNOSIS — Z7952 Long term (current) use of systemic steroids: Secondary | ICD-10-CM | POA: Insufficient documentation

## 2015-09-14 LAB — COMPREHENSIVE METABOLIC PANEL
ALBUMIN: 4.2 g/dL (ref 3.5–5.0)
ALT: 12 U/L — ABNORMAL LOW (ref 17–63)
ANION GAP: 8 (ref 5–15)
AST: 18 U/L (ref 15–41)
Alkaline Phosphatase: 61 U/L (ref 38–126)
BILIRUBIN TOTAL: 0.5 mg/dL (ref 0.3–1.2)
BUN: 21 mg/dL — ABNORMAL HIGH (ref 6–20)
CO2: 33 mmol/L — ABNORMAL HIGH (ref 22–32)
Calcium: 9 mg/dL (ref 8.9–10.3)
Chloride: 98 mmol/L — ABNORMAL LOW (ref 101–111)
Creatinine, Ser: 1.17 mg/dL (ref 0.61–1.24)
GFR calc non Af Amer: >60 mL/min (ref >60)
GLUCOSE: 139 mg/dL — AB (ref 65–99)
POTASSIUM: 3.5 mmol/L (ref 3.5–5.1)
SODIUM: 139 mmol/L (ref 135–145)
TOTAL PROTEIN: 6.9 g/dL (ref 6.5–8.1)

## 2015-09-14 LAB — CBC WITH DIFFERENTIAL/PLATELET
BASOS PCT: 1 %
Basophils Absolute: 0 10*3/uL (ref 0–0.1)
EOS ABS: 0.2 10*3/uL (ref 0–0.7)
Eosinophils Relative: 2 %
HEMATOCRIT: 29.1 % — AB (ref 40.0–52.0)
Hemoglobin: 9.3 g/dL — ABNORMAL LOW (ref 13.0–18.0)
Lymphocytes Relative: 44 %
Lymphs Abs: 3.6 10*3/uL (ref 1.0–3.6)
MCH: 27.2 pg (ref 26.0–34.0)
MCHC: 31.8 g/dL — AB (ref 32.0–36.0)
MCV: 85.5 fL (ref 80.0–100.0)
MONO ABS: 0.6 10*3/uL (ref 0.2–1.0)
MONOS PCT: 8 %
Neutro Abs: 3.6 10*3/uL (ref 1.4–6.5)
Neutrophils Relative %: 45 %
Platelets: 191 10*3/uL (ref 150–440)
RBC: 3.41 MIL/uL — ABNORMAL LOW (ref 4.40–5.90)
RDW: 14.8 % — AB (ref 11.5–14.5)
WBC: 8 10*3/uL (ref 3.8–10.6)

## 2015-09-14 MED ORDER — HEPARIN SOD (PORK) LOCK FLUSH 100 UNIT/ML IV SOLN
500.0000 [IU] | Freq: Once | INTRAVENOUS | Status: AC
  Administered 2015-09-14: 500 [IU] via INTRAVENOUS
  Filled 2015-09-14: qty 5

## 2015-09-14 MED ORDER — SODIUM CHLORIDE 0.9 % IJ SOLN
10.0000 mL | Freq: Once | INTRAMUSCULAR | Status: AC
  Administered 2015-09-14: 10 mL via INTRAVENOUS
  Filled 2015-09-14: qty 10

## 2015-09-14 NOTE — Progress Notes (Signed)
Informed that prescription is ready to pick up  [place] @ [facility] [phone]  Fax:([phone]) [place]. OB: 06-07-1951  MR#: [id number]  GDJ#:[id number]  Patient Care Team: [name], MD as PCP - General (Family Medicine)  CHIEF COMPLAINT:  Chief Complaint  Patient presents with  . Lung Cancer    Oncology History   1. Right upper lobe lung mass biopsies positive for adenocarcinoma.   Based on PET scan patient has T2, N2, M0 tumor stage III a 2. Poor pulmonary function 3. Patient was started on carboplatinum Alimta and Avastin   on april  9th of 2013 4. Patient had an allergic reaction to carboplatinum with acute bronchospasm in May of 2013.  Patient is now being taken off carboplatinum. 5. Started on maintenance Alimta and Avastin  from June of 2013 hold chemotherapy because of increasing shortness of breath (in July, 2013) 6.VARISTRAT test is good started on Tarceva February 3 about 2014 diarrhea 4 days after Tarceva was started.  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 9.bnormal CT and MRI scan of the right kidneywith solid massin the upper pole right kidney 10.biopsy of renal masses consistent with primary renal cell cancer 11.  Patient started on  [facility] August 09, 2014 12 NIVOLULAMAB has been put on hold from August of 2016 because of   PROGRESSIVE  respiratory difficulty     Lung cancer, upper lobe ([place])   02/03/2015 Initial Diagnosis Lung cancer, upper lobe    Oncology Flowsheet 02/14/2015 02/28/2015 03/24/2015 04/14/2015 04/28/2015  nivolumab (OPDIVO) IV 200 mg 200 mg 200 mg 200 mg 200 mg    INTERVAL HISTORY:   64 year old African-American gentleman came today further follow-up patient has multiple medical illnesses including lung cancer.  History of renal cancer.  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  Doing relatively stable Had a chest x-ray done.  Cough or shortness of breath has been stable.  Patient did not go to   emergency room was not hospitalized.     REVIEW OF SYSTEMS:   Gen. status: Patient's performance status is stable in wheelchair Lungs: Increasing shortness of breath.  Cough.    No chills or fever Cardiac: No chest pain or paroxysmal nocturnal dyspnea Abdomen: No nausea no vomiting no diarrhea GU: No hematuria dysuria Lower extremity no swelling Skin: No rash Neurological system no headache no dizziness Pain has improved taking Vicodin  As per HPI. Otherwise, a complete review of systems is negatve.  PAST MEDICAL HISTORY: Past Medical History  Diagnosis Date  . Lung cancer ([place])     right upper lobe mass positive for adenocarcinoma  . Cancer ([place])   . Renal cancer ([place])   . Lymphoma ([facility])     low grade  . COPD (chronic obstructive pulmonary disease) ([place])   . Stroke ([place])   . Hypertension   . Diabetes mellitus without complication ([place])   . Respiratory failure ([place]) 07/12/2010    acute  . Colon cancer ([place])   . Tuberculosis   . Gout   . Glaucoma   . Pancreatitis   . Cholecystitis     PAST SURGICAL HIST0RY   COPD:    acute resp failure: 12-Jul-2010   lung ca:    CVA/Stroke:    HTN:  Diabetes Mellitus, Type II ([place]):   Smoking History: Smoking History 3(1)quit Sept. 2011(1)  PFSH: Comments: history of colon cancer, doubt, diabetes, tuberculosis  Comments: has been a chronic smoker for more than 25 years pack a day history does not drink.  Works in the [employer]  Additional Past Medical and Surgical History: diabetes, type II,.  Hypertensin, history of stroke, chronic obstructive pulmonary disease,.  glaucoma.  History of pancreatitis and cholecystitis       ADVANCED DIRECTIVES: Patient does have advanced care directive   HEALTH MAINTENANCE: Social  History  Substance Use Topics  . Smoking status: Former Smoker -- 1.00 packs/day for 25 years    Quit date: 01/13/2010  . Smokeless tobacco: None  . Alcohol Use: No       Allergies  Allergen Reactions  . Iodine Shortness Of Breath, Diarrhea and Nausea And Vomiting  . Nexium [Esomeprazole Magnesium] Other (See Comments)    headache    Current Outpatient Prescriptions  Medication Sig Dispense Refill  . albuterol (PROVENTIL HFA;VENTOLIN HFA) 108 (90 BASE) MCG/ACT inhaler Inhale 2 puffs into the lungs every 6 (six) hours as needed for wheezing or shortness of breath.    Marland Kitchen albuterol (PROVENTIL) (2.5 MG/3ML) 0.083% nebulizer solution USE 1 VIAL IN NEBULIZER EVERY 3-4 HOURS AS NEEDED 75 mL 3  . budesonide-formoterol (SYMBICORT) 160-4.5 MCG/ACT inhaler Inhale 2 puffs into the lungs 2 (two) times daily.    . diphenhydrAMINE (BENADRYL) 12.5 MG chewable tablet Chew 25 mg by mouth daily as needed for allergies.    Marland Kitchen glipiZIDE (GLUCOTROL) 10 MG tablet TAKE 1 TABLET BY MOUTH DAILY 30 tablet 6  . guaiFENesin (MUCINEX) 600 MG 12 hr tablet Take 1 tablet (600 mg total) by mouth 2 (two) times daily. 60 tablet 0  . HYDROcodone-acetaminophen (NORCO/VICODIN) 5-325 MG tablet Take 1-2 tablets by mouth every 6 (six) hours as needed for moderate pain. 90 tablet 0  . ipratropium-albuterol (DUONEB) 0.5-2.5 (3) MG/3ML SOLN Take 3 mLs by nebulization every 6 (six) hours as needed (shortness of breath).    . latanoprost (XALATAN) 0.005 % ophthalmic solution     . loperamide (IMODIUM A-D) 2 MG tablet Take 2 mg by mouth 4 (four) times daily as needed for diarrhea or loose stools.    Marland Kitchen losartan-hydrochlorothiazide (HYZAAR) 50-12.5 MG per tablet Take 1 tablet by mouth daily.    . pioglitazone (ACTOS) 30 MG tablet Take 30 mg by mouth daily.    . predniSONE (DELTASONE) 10 MG tablet Take 1 tablet (10 mg total) by mouth daily. 30 tablet 3  . temazepam (RESTORIL) 7.5 MG capsule Take 1-2 capsules by mouth at bedtime as  needed.  2  . theophylline (UNIPHYL) 400 MG 24 hr tablet     . tiotropium (SPIRIVA) 18 MCG inhalation capsule Place 18 mcg into inhaler and inhale daily.    . Travoprost, BAK Free, (TRAVATAN) 0.004 % SOLN ophthalmic solution 1 drop at bedtime.    Marland Kitchen zolpidem (AMBIEN) 10 MG tablet Take 10 mg by mouth at bedtime as needed for sleep.     No current facility-administered medications for this visit.   Facility-Administered Medications Ordered in Other Visits  Medication Dose Route Frequency Provider Last Rate Last Dose  . sodium chloride 0.9 % injection 10 mL  10 mL Intracatheter PRN Forest Gleason, MD   10 mL at 04/28/15 1410  . sodium chloride 0.9 % injection 10 mL  10 mL Intravenous PRN Forest Gleason, MD   10 mL at 05/12/15  1350    OBJECTIVE:  Filed Vitals:   09/14/15 1030  BP: 124/71  Pulse: 92  Temp: 96.2 F (35.7 C)     Body mass index is 24.79 kg/(m^2).    ECOG FS:1 - Symptomatic but completely ambulatory  PHYSICAL EXAM:  Gen. status: Patient is alert oriented not any acute distress using oxygen. HEENT: No evidence of stomatitis. Lymphatic system: No palpable supraclavicular cervical axillary lymphadenopathy Lungs: Diminished at [facility] both sides O rhonchi coarse crepitation Bilateral rhonchi Cardiac: Soft systolic murmur.  Tachycardia Abdominal exam revealed normal bowel sounds. The abdomen was soft, non-tender, and without masses, organomegaly, or appreciable enlargement of the abdominal aorta. Skin: No ecchymosis no rash Neurological system no localizing sign All other systems has been examined no abnormality detected  LAB RESULTS:  Infusion on 09/14/2015  Component Date Value Ref Range Status  . WBC 09/14/2015 8.0  3.8 - 10.6 K/uL Final  . RBC 09/14/2015 3.41* 4.40 - 5.90 MIL/uL Final  . Hemoglobin 09/14/2015 9.3* 13.0 - 18.0 g/dL Final  . HCT 09/14/2015 29.1* 40.0 - 52.0 % Final  . MCV 09/14/2015 85.5  80.0 - 100.0 fL Final  . MCH 09/14/2015 27.2  26.0 - 34.0 pg Final   . MCHC 09/14/2015 31.8* 32.0 - 36.0 g/dL Final  . RDW 09/14/2015 14.8* 11.5 - 14.5 % Final  . Platelets 09/14/2015 191  150 - 440 K/uL Final  . Neutrophils Relative % 09/14/2015 45   Final  . Neutro Abs 09/14/2015 3.6  1.4 - 6.5 K/uL Final  . Lymphocytes Relative 09/14/2015 44   Final  . Lymphs Abs 09/14/2015 3.6  1.0 - 3.6 K/uL Final  . Monocytes Relative 09/14/2015 8   Final  . Monocytes Absolute 09/14/2015 0.6  0.2 - 1.0 K/uL Final  . Eosinophils Relative 09/14/2015 2   Final  . Eosinophils Absolute 09/14/2015 0.2  0 - 0.7 K/uL Final  . Basophils Relative 09/14/2015 1   Final  . Basophils Absolute 09/14/2015 0.0  0 - 0.1 K/uL Final  . Sodium 09/14/2015 139  135 - 145 mmol/L Final  . Potassium 09/14/2015 3.5  3.5 - 5.1 mmol/L Final  . Chloride 09/14/2015 98* 101 - 111 mmol/L Final  . CO2 09/14/2015 33* 22 - 32 mmol/L Final  . Glucose, Bld 09/14/2015 139* 65 - 99 mg/dL Final  . BUN 09/14/2015 21* 6 - 20 mg/dL Final  . Creatinine, Ser 09/14/2015 1.17  0.61 - 1.24 mg/dL Final  . Calcium 09/14/2015 9.0  8.9 - 10.3 mg/dL Final  . Total Protein 09/14/2015 6.9  6.5 - 8.1 g/dL Final  . Albumin 09/14/2015 4.2  3.5 - 5.0 g/dL Final  . AST 09/14/2015 18  15 - 41 U/L Final  . ALT 09/14/2015 12* 17 - 63 U/L Final  . Alkaline Phosphatase 09/14/2015 61  38 - 126 U/L Final  . Total Bilirubin 09/14/2015 0.5  0.3 - 1.2 mg/dL Final  . GFR calc non Af Amer 09/14/2015 >60  >60 mL/min Final  . GFR calc Af Amer 09/14/2015 >60  >60 mL/min Final   Comment: (NOTE) The eGFR has been calculated using the CKD EPI equation. This calculation has not been validated in all clinical situations. eGFR's persistently <60 mL/min signify possible Chronic Kidney Disease.   . Anion gap 09/14/2015 8  5 - 15 Final      STUDIES: Dg Chest 2 View  09/14/2015  CLINICAL DATA:  Shortness of breath.  History of right lung cancer. EXAM: CHEST  2  VIEW COMPARISON:  06/16/2015.  04/14/2015.  PET-CT 12/13/2014 . FINDINGS:  Port-A-Cath in stable position. Stable changes of right upper lobe pleural parenchymal scarring. No evidence of recurrent mass. Heart size stable. Stable basal pleural thickening. No pneumothorax. Stable old healed bilateral rib fractures. No acute bony abnormality. Diffuse osteopenia. Degenerative change thoracic spine. IMPRESSION: 1.  PowerPort in stable position. 2. Stable right upper lobe pleural parenchymal changes consistent with scarring. No acute cardiopulmonary disease noted. Electronically Signed   By: Marcello Moores  Register   On: 09/14/2015 09:47    ASSESSMENT: CARCINOMA of lung stage IV disease on NIVOLULAMAB stable disease based on CT scan and a PET scan Has been discontinued because of diarrhea.    Carcinoma of kidney No evidence of progressive disease Retroperitoneal lymphadenopathy low-grade lymphoma Table on last PET scan Chest x-ray: Shows a stable disease.  No acute cardiopulmonary abnormality  COPD MEDICAL DECISION MAKING:  All lab data has been reviewed. Anemia multifactorial COPD, carcinoma of lung on oxygen oxygen saturation is stable We will continue prednisone Chest x-rays been reviewed which is stable at present time.  Reevaluation in 6 weeks if needed a PET scan would be done for complete staging off all his malignancy.  Blood sugar is stable. Continue low-dose of prednisone for management of COPD and shortness of breath  Patient has a chronic pain and will continue hydrocodone  Lung cancer, upper lobe   Staging form: Lung, AJCC 7th Edition     Clinical stage from 06/15/2010: yT2, N2, M0 - Signed by [name], NP on 02/03/2015     Pathologic stage from 04/04/2014: yT2, N2, M1 - Signed by [name], NP on 02/03/2015 Lymphoma, small lymphocytic   Staging form: Lymphoid Neoplasms, AJCC 6th Edition     Clinical stage from 10/15/2013: Stage II - Signed by [name], NP on 02/03/2015 Renal cell cancer   Staging form: Kidney, AJCC 7th Edition     Clinical  stage from 02/03/2015: Stage I (yT1a, N0, M0) - Signed by [name], NP on 02/03/2015   Forest Gleason, MD   09/14/2015 10:43 AM

## 2015-09-14 NOTE — Progress Notes (Signed)
Patient requesting refill for Hydrocodone.

## 2015-09-15 ENCOUNTER — Telehealth: Payer: Self-pay | Admitting: *Deleted

## 2015-09-15 DIAGNOSIS — C3492 Malignant neoplasm of unspecified part of left bronchus or lung: Secondary | ICD-10-CM

## 2015-09-15 MED ORDER — HYDROCODONE-ACETAMINOPHEN 5-325 MG PO TABS: 1.0000 | ORAL_TABLET | Freq: Four times a day (QID) | ORAL | Status: DC | PRN

## 2015-09-15 NOTE — Telephone Encounter (Signed)
Hydrocodone refill.

## 2015-09-27 ENCOUNTER — Telehealth: Payer: Self-pay | Admitting: *Deleted

## 2015-09-27 MED ORDER — AZITHROMYCIN 500 MG PO TABS: 500.0000 mg | ORAL_TABLET | Freq: Every day | ORAL | Status: DC

## 2015-09-27 MED ORDER — PREDNISONE 10 MG PO TABS: ORAL_TABLET | ORAL | Status: DC

## 2015-09-27 NOTE — Telephone Encounter (Addendum)
Congested and wheezing for a day and a half, denies fever. Asking Dr Oliva Bustard order something for him

## 2015-09-27 NOTE — Telephone Encounter (Signed)
Prednisone taper and Azitromycin e scribed, pt notified

## 2015-10-05 ENCOUNTER — Telehealth: Payer: Self-pay | Admitting: *Deleted

## 2015-10-05 DIAGNOSIS — C3492 Malignant neoplasm of unspecified part of left bronchus or lung: Secondary | ICD-10-CM

## 2015-10-05 MED ORDER — HYDROCODONE-ACETAMINOPHEN 5-325 MG PO TABS: 1.0000 | ORAL_TABLET | Freq: Four times a day (QID) | ORAL | Status: DC | PRN

## 2015-10-05 NOTE — Telephone Encounter (Signed)
Informed that prescription is ready to pick up  

## 2015-10-26 ENCOUNTER — Inpatient Hospital Stay: Payer: Federal, State, Local not specified - PPO

## 2015-10-26 ENCOUNTER — Ambulatory Visit
Admission: RE | Admit: 2015-10-26 | Discharge: 2015-10-26 | Disposition: A | Discharge status: Home / Self Care | Payer: Federal, State, Local not specified - PPO | Source: Ambulatory Visit | Attending: Oncology | Admitting: Oncology

## 2015-10-26 ENCOUNTER — Inpatient Hospital Stay: Payer: Federal, State, Local not specified - PPO | Attending: Oncology | Admitting: Oncology

## 2015-10-26 VITALS — BP 125/83 | HR 118 | Temp 96.9°F | Wt 156.1 lb

## 2015-10-26 DIAGNOSIS — R0602 Shortness of breath: Secondary | ICD-10-CM | POA: Insufficient documentation

## 2015-10-26 DIAGNOSIS — Z8719 Personal history of other diseases of the digestive system: Secondary | ICD-10-CM | POA: Diagnosis not present

## 2015-10-26 DIAGNOSIS — J449 Chronic obstructive pulmonary disease, unspecified: Secondary | ICD-10-CM | POA: Diagnosis not present

## 2015-10-26 DIAGNOSIS — Z9221 Personal history of antineoplastic chemotherapy: Secondary | ICD-10-CM | POA: Diagnosis not present

## 2015-10-26 DIAGNOSIS — C9110 Chronic lymphocytic leukemia of B-cell type not having achieved remission: Secondary | ICD-10-CM | POA: Insufficient documentation

## 2015-10-26 DIAGNOSIS — C3410 Malignant neoplasm of upper lobe, unspecified bronchus or lung: Secondary | ICD-10-CM | POA: Insufficient documentation

## 2015-10-26 DIAGNOSIS — Z8639 Personal history of other endocrine, nutritional and metabolic disease: Secondary | ICD-10-CM | POA: Insufficient documentation

## 2015-10-26 DIAGNOSIS — Z7952 Long term (current) use of systemic steroids: Secondary | ICD-10-CM | POA: Insufficient documentation

## 2015-10-26 DIAGNOSIS — E119 Type 2 diabetes mellitus without complications: Secondary | ICD-10-CM | POA: Diagnosis not present

## 2015-10-26 DIAGNOSIS — Z87891 Personal history of nicotine dependence: Secondary | ICD-10-CM | POA: Diagnosis not present

## 2015-10-26 DIAGNOSIS — Z8611 Personal history of tuberculosis: Secondary | ICD-10-CM | POA: Diagnosis not present

## 2015-10-26 DIAGNOSIS — Z79899 Other long term (current) drug therapy: Secondary | ICD-10-CM | POA: Insufficient documentation

## 2015-10-26 DIAGNOSIS — I10 Essential (primary) hypertension: Secondary | ICD-10-CM | POA: Insufficient documentation

## 2015-10-26 DIAGNOSIS — C649 Malignant neoplasm of unspecified kidney, except renal pelvis: Secondary | ICD-10-CM | POA: Diagnosis not present

## 2015-10-26 DIAGNOSIS — Z8673 Personal history of transient ischemic attack (TIA), and cerebral infarction without residual deficits: Secondary | ICD-10-CM | POA: Diagnosis not present

## 2015-10-26 DIAGNOSIS — Z9981 Dependence on supplemental oxygen: Secondary | ICD-10-CM | POA: Diagnosis not present

## 2015-10-26 DIAGNOSIS — Z7984 Long term (current) use of oral hypoglycemic drugs: Secondary | ICD-10-CM

## 2015-10-26 DIAGNOSIS — C3492 Malignant neoplasm of unspecified part of left bronchus or lung: Secondary | ICD-10-CM

## 2015-10-26 DIAGNOSIS — C3411 Malignant neoplasm of upper lobe, right bronchus or lung: Secondary | ICD-10-CM | POA: Diagnosis not present

## 2015-10-26 DIAGNOSIS — Z85828 Personal history of other malignant neoplasm of skin: Secondary | ICD-10-CM

## 2015-10-26 LAB — CBC WITH DIFFERENTIAL/PLATELET
Basophils Absolute: 0 10*3/uL (ref 0–0.1)
Basophils Relative: 0 %
Eosinophils Absolute: 0.1 10*3/uL (ref 0–0.7)
Eosinophils Relative: 1 %
HEMATOCRIT: 33.8 % — AB (ref 40.0–52.0)
HEMOGLOBIN: 10.8 g/dL — AB (ref 13.0–18.0)
LYMPHS ABS: 3.3 10*3/uL (ref 1.0–3.6)
Lymphocytes Relative: 32 %
MCH: 27.6 pg (ref 26.0–34.0)
MCHC: 32.1 g/dL (ref 32.0–36.0)
MCV: 86 fL (ref 80.0–100.0)
MONOS PCT: 7 %
Monocytes Absolute: 0.7 10*3/uL (ref 0.2–1.0)
NEUTROS ABS: 6.1 10*3/uL (ref 1.4–6.5)
NEUTROS PCT: 60 %
Platelets: 192 10*3/uL (ref 150–440)
RBC: 3.93 MIL/uL — ABNORMAL LOW (ref 4.40–5.90)
RDW: 15.3 % — ABNORMAL HIGH (ref 11.5–14.5)
WBC: 10.3 10*3/uL (ref 3.8–10.6)

## 2015-10-26 LAB — COMPREHENSIVE METABOLIC PANEL
ALK PHOS: 74 U/L (ref 38–126)
ALT: 12 U/L — ABNORMAL LOW (ref 17–63)
ANION GAP: 5 (ref 5–15)
AST: 16 U/L (ref 15–41)
Albumin: 4.4 g/dL (ref 3.5–5.0)
BILIRUBIN TOTAL: 0.4 mg/dL (ref 0.3–1.2)
BUN: 22 mg/dL — ABNORMAL HIGH (ref 6–20)
CALCIUM: 9.1 mg/dL (ref 8.9–10.3)
CO2: 34 mmol/L — ABNORMAL HIGH (ref 22–32)
Chloride: 98 mmol/L — ABNORMAL LOW (ref 101–111)
Creatinine, Ser: 1.2 mg/dL (ref 0.61–1.24)
GFR calc non Af Amer: >60 mL/min (ref >60)
GLUCOSE: 98 mg/dL (ref 65–99)
Potassium: 3.8 mmol/L (ref 3.5–5.1)
Sodium: 137 mmol/L (ref 135–145)
TOTAL PROTEIN: 7.6 g/dL (ref 6.5–8.1)

## 2015-10-26 MED ORDER — HYDROCODONE-ACETAMINOPHEN 5-325 MG PO TABS: 1.0000 | ORAL_TABLET | Freq: Four times a day (QID) | ORAL | Status: DC | PRN

## 2015-10-26 NOTE — Progress Notes (Signed)
[place] @ [facility] [phone]  [phone]     [name]. OB: [birth year]  MR#: [id number]  QBH#:[id number]  Patient Care Team: [name], MD as PCP - General (Family Medicine)  CHIEF COMPLAINT:  Chief Complaint  Patient presents with  . Lung Cancer    Oncology History   1. Right upper lobe lung mass biopsies positive for adenocarcinoma.   Based on PET scan patient has T2, N2, M0 tumor stage III a 2. Poor pulmonary function 3. Patient was started on carboplatinum Alimta and Avastin   on april  9th of 2013 4. Patient had an allergic reaction to carboplatinum with acute bronchospasm in May of 2013.  Patient is now being taken off carboplatinum. 5. Started on maintenance Alimta and Avastin  from June of 2013 hold chemotherapy because of increasing shortness of breath (in July, 2013) 6.VARISTRAT test is good started on Tarceva February 3 about 2014 diarrhea 4 days after Tarceva was started.  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 9.bnormal CT and MRI scan of the right kidneywith solid massin the upper pole right kidney 10.biopsy of renal masses consistent with primary renal cell cancer 11.  Patient started on  [facility] [place] August 09, 2014 12 NIVOLULAMAB has been put on hold from August of 2016 because of   PROGRESSIVE  respiratory difficulty     Lung cancer, upper lobe ([place])   02/03/2015 Initial Diagnosis Lung cancer, upper lobe    Oncology Flowsheet 02/14/2015 02/28/2015 03/24/2015 04/14/2015 04/28/2015  nivolumab (OPDIVO) IV 200 mg 200 mg 200 mg 200 mg 200 mg    INTERVAL HISTORY:   Patient is here for continued follow-up and further evaluation regarding carcinoma of lung as well as renal cell cancer, patient also with history of low-grade lymphoma. Patient is currently oxygen dependent. Reports some increasing  shortness of breath with exertion, typically has to turn oxygen up for recovery. Chest x-ray performed today showing chronic changes with no acute abnormality. He is in a wheelchair today and accompanied by a family member. He overall reports feeling fairly well other than fatigue and shortness of breath. Has occasional pain which is managed with when necessary medications.    REVIEW OF SYSTEMS:   Gen. status: Patient's performance status is stable in wheelchair Lungs: Persistent shortness of breath.  Cough.    No chills or fever Cardiac: No chest pain or paroxysmal nocturnal dyspnea Abdomen: No nausea no vomiting no diarrhea GU: No hematuria dysuria Lower extremity no swelling Skin: No rash Neurological system no headache no dizziness Pain has improved taking Vicodin  As per HPI. Otherwise, a complete review of systems is negatve.  PAST MEDICAL HISTORY: Past Medical History  Diagnosis Date  . Lung cancer ([place])     right upper lobe mass positive for adenocarcinoma  . Cancer ([place])   . Renal cancer ([place])   . Lymphoma (HCC)     low grade  . COPD (chronic obstructive pulmonary disease) ([place])   . Stroke ([place])   . Hypertension   . Diabetes mellitus without complication ([place])   . Respiratory failure ([place]) 07/12/2010    acute  . Colon cancer ([place])   . Tuberculosis   . Gout   . Glaucoma   . Pancreatitis   . Cholecystitis     PAST SURGICAL HIST0RY   COPD:    acute resp failure: 12-Jul-2010  lung ca:    CVA/Stroke:    HTN:    Diabetes Mellitus, Type II (NIDD):   Smoking History: Smoking History 3(1)quit Sept. 2011(1)  PFSH: Comments: history of colon cancer, doubt, diabetes, tuberculosis  Comments: has been a chronic smoker for more than 25 years pack a day history does not drink.  Works in the post [employer]  Additional Past Medical and Surgical History: diabetes, type II,.  Hypertensin, history of stroke, chronic obstructive pulmonary disease,.  glaucoma.  History of  pancreatitis and cholecystitis       ADVANCED DIRECTIVES: Patient does have advanced care directive   HEALTH MAINTENANCE: Social History  Substance Use Topics  . Smoking status: Former Smoker -- 1.00 packs/day for 25 years    Quit date: 01/13/2010  . Smokeless tobacco: Not on file  . Alcohol Use: No       Allergies  Allergen Reactions  . Iodine Shortness Of Breath, Diarrhea and Nausea And Vomiting  . Nexium [Esomeprazole Magnesium] Other (See Comments)    headache    Current Outpatient Prescriptions  Medication Sig Dispense Refill  . albuterol (PROVENTIL HFA;VENTOLIN HFA) 108 (90 BASE) MCG/ACT inhaler Inhale 2 puffs into the lungs every 6 (six) hours as needed for wheezing or shortness of breath.    . albuterol (PROVENTIL) (2.5 MG/3ML) 0.083% nebulizer solution USE 1 VIAL IN NEBULIZER EVERY 3-4 HOURS AS NEEDED 75 mL 3  . azithromycin (ZITHROMAX) 500 MG tablet Take 1 tablet (500 mg total) by mouth daily. 5 tablet 0  . budesonide-formoterol (SYMBICORT) 160-4.5 MCG/ACT inhaler Inhale 2 puffs into the lungs 2 (two) times daily.    . diphenhydrAMINE (BENADRYL) 12.5 MG chewable tablet Chew 25 mg by mouth daily as needed for allergies.    . glipiZIDE (GLUCOTROL) 10 MG tablet TAKE 1 TABLET BY MOUTH DAILY 30 tablet 6  . guaiFENesin (MUCINEX) 600 MG 12 hr tablet Take 1 tablet (600 mg total) by mouth 2 (two) times daily. 60 tablet 0  . HYDROcodone-acetaminophen (NORCO/VICODIN) 5-325 MG tablet Take 1-2 tablets by mouth every 6 (six) hours as needed for moderate pain. 90 tablet 0  . ipratropium-albuterol (DUONEB) 0.5-2.5 (3) MG/3ML SOLN Take 3 mLs by nebulization every 6 (six) hours as needed (shortness of breath).    . latanoprost (XALATAN) 0.005 % ophthalmic solution     . loperamide (IMODIUM A-D) 2 MG tablet Take 2 mg by mouth 4 (four) times daily as needed for diarrhea or loose stools.    . losartan-hydrochlorothiazide (HYZAAR) 50-12.5 MG per tablet Take 1 tablet by mouth daily.    .  pioglitazone (ACTOS) 30 MG tablet Take 30 mg by mouth daily.    . predniSONE (DELTASONE) 10 MG tablet Take 1 tablet (10 mg total) by mouth daily. 30 tablet 3  . predniSONE (DELTASONE) 10 MG tablet 60 mg day 1, 50 mg day 2, 40 mg day 3, 30 mg day 4, 20 mg day 5, then 10 mg daily 20 tablet 0  . temazepam (RESTORIL) 7.5 MG capsule Take 1-2 capsules by mouth at bedtime as needed.  2  . theophylline (UNIPHYL) 400 MG 24 hr tablet     . tiotropium (SPIRIVA) 18 MCG inhalation capsule Place 18 mcg into inhaler and inhale daily.    . Travoprost, BAK Free, (TRAVATAN) 0.004 % SOLN ophthalmic solution 1 drop at bedtime.    . zolpidem (AMBIEN) 10 MG tablet Take 10 mg by mouth at bedtime as needed for sleep.     No current facility-administered medications   for this visit.   Facility-Administered Medications Ordered in Other Visits  Medication Dose Route Frequency Provider Last Rate Last Dose  . sodium chloride 0.9 % injection 10 mL  10 mL Intracatheter PRN Janak Choksi, MD   10 mL at 04/28/15 1410  . sodium chloride 0.9 % injection 10 mL  10 mL Intravenous PRN Janak Choksi, MD   10 mL at 05/12/15 1350    OBJECTIVE:  Filed Vitals:   10/26/15 1040  BP: 125/83  Pulse: 118  Temp: 96.9 F (36.1 C)     Body mass index is 23.74 kg/(m^2).    ECOG FS:2 - Symptomatic, <50% confined to bed  PHYSICAL EXAM:  Gen. status: Patient is alert oriented not any acute distress. In wheelchair, using oxygen. HEENT: No evidence of stomatitis. Lymphatic system: No palpable supraclavicular cervical axillary lymphadenopathy Lungs: Bilateral coarse rhonchi  Cardiac: Soft systolic murmur.  Tachycardia Abdominal exam revealed normal bowel sounds. The abdomen was soft, non-tender, and without masses, organomegaly, or appreciable enlargement of the abdominal aorta. Skin: No ecchymosis no rash Neurological system no localizing sign All other systems has been examined no abnormality detected  LAB RESULTS:  Appointment on  10/26/2015  Component Date Value Ref Range Status  . WBC 10/26/2015 10.3  3.8 - 10.6 K/uL Final  . RBC 10/26/2015 3.93* 4.40 - 5.90 MIL/uL Final  . Hemoglobin 10/26/2015 10.8* 13.0 - 18.0 g/dL Final  . HCT 10/26/2015 33.8* 40.0 - 52.0 % Final  . MCV 10/26/2015 86.0  80.0 - 100.0 fL Final  . MCH 10/26/2015 27.6  26.0 - 34.0 pg Final  . MCHC 10/26/2015 32.1  32.0 - 36.0 g/dL Final  . RDW 10/26/2015 15.3* 11.5 - 14.5 % Final  . Platelets 10/26/2015 192  150 - 440 K/uL Final  . Neutrophils Relative % 10/26/2015 60   Final  . Neutro Abs 10/26/2015 6.1  1.4 - 6.5 K/uL Final  . Lymphocytes Relative 10/26/2015 32   Final  . Lymphs Abs 10/26/2015 3.3  1.0 - 3.6 K/uL Final  . Monocytes Relative 10/26/2015 7   Final  . Monocytes Absolute 10/26/2015 0.7  0.2 - 1.0 K/uL Final  . Eosinophils Relative 10/26/2015 1   Final  . Eosinophils Absolute 10/26/2015 0.1  0 - 0.7 K/uL Final  . Basophils Relative 10/26/2015 0   Final  . Basophils Absolute 10/26/2015 0.0  0 - 0.1 K/uL Final  . Sodium 10/26/2015 137  135 - 145 mmol/L Final  . Potassium 10/26/2015 3.8  3.5 - 5.1 mmol/L Final  . Chloride 10/26/2015 98* 101 - 111 mmol/L Final  . CO2 10/26/2015 34* 22 - 32 mmol/L Final  . Glucose, Bld 10/26/2015 98  65 - 99 mg/dL Final  . BUN 10/26/2015 22* 6 - 20 mg/dL Final  . Creatinine, Ser 10/26/2015 1.20  0.61 - 1.24 mg/dL Final  . Calcium 10/26/2015 9.1  8.9 - 10.3 mg/dL Final  . Total Protein 10/26/2015 7.6  6.5 - 8.1 g/dL Final  . Albumin 10/26/2015 4.4  3.5 - 5.0 g/dL Final  . AST 10/26/2015 16  15 - 41 U/L Final  . ALT 10/26/2015 12* 17 - 63 U/L Final  . Alkaline Phosphatase 10/26/2015 74  38 - 126 U/L Final  . Total Bilirubin 10/26/2015 0.4  0.3 - 1.2 mg/dL Final  . GFR calc non Af Amer 10/26/2015 >60  >60 mL/min Final  . GFR calc Af Amer 10/26/2015 >60  >60 mL/min Final   Comment: (NOTE) The eGFR has been   calculated using the CKD EPI equation. This calculation has not been validated in all  clinical situations. eGFR's persistently <60 mL/min signify possible Chronic Kidney Disease.   . Anion gap 10/26/2015 5  5 - 15 Final      STUDIES: Dg Chest 2 View  10/26/2015  CLINICAL DATA:  History of right lung cancer with shortness of Breath EXAM: CHEST - 2 VIEW COMPARISON:  09/14/2015 FINDINGS: Cardiac shadow is stable. A left chest wall port is again identified and stable. Scarring is noted in the right lung consistent with patient's given clinical history. Old healed rib fractures are again noted on the right. The lungs are hyperinflated without focal infiltrate or sizable effusion. No acute compression deformity is noted. A mild lower thoracic compression deformity is again seen. IMPRESSION: Chronic changes without acute abnormality. Electronically Signed   By: Mark  Lukens M.D.   On: 10/26/2015 09:54    ASSESSMENT: 1. Carcinoma of lung, stage IV disease. Nivolumab was discontinued due to increasing shortness of breath, concerning for pneumonitis, as well as diarrhea. Chest x-ray today shows chronic changes but no acute abnormality. Plan to scan patient with PET scan for restaging in approximately 5 weeks. 2. Carcinoma of kidney. No evidence of progressive disease. 3. Retroperitoneal lymphadenopathy low-grade lymphoma. On last imaging, stable disease.  Patient advised to continue with daily prednisone as well as pain medication as needed. Continue with home oxygen therapy. Plan to reimage in approximately 5 weeks with a PET scan for restaging and then follow-up with Dr. Choksi in 6 weeks.  Lung cancer, upper lobe   Staging form: Lung, AJCC 7th Edition     Clinical stage from 06/15/2010: yT2, N2, M0 - Signed by Leslie F Herring, NP on 02/03/2015     Pathologic stage from 04/04/2014: yT2, N2, M1 - Signed by Leslie F Herring, NP on 02/03/2015 Lymphoma, small lymphocytic   Staging form: Lymphoid Neoplasms, AJCC 6th Edition     Clinical stage from 10/15/2013: Stage II - Signed by Leslie F  Herring, NP on 02/03/2015 Renal cell cancer   Staging form: Kidney, AJCC 7th Edition     Clinical stage from 02/03/2015: Stage I (yT1a, N0, M0) - Signed by Leslie F Herring, NP on 02/03/2015   Leslie F Herring, NP   10/26/2015 11:05 AM 

## 2015-10-26 NOTE — Progress Notes (Signed)
Patient requesting refill for Vicodin.

## 2015-10-27 ENCOUNTER — Other Ambulatory Visit: Payer: Self-pay | Admitting: Oncology

## 2015-10-27 DIAGNOSIS — C3492 Malignant neoplasm of unspecified part of left bronchus or lung: Secondary | ICD-10-CM

## 2015-10-27 NOTE — Addendum Note (Signed)
Addended by: Jarrett Soho C on: 10/27/2015 11:14 AM   Modules accepted: Orders, Medications

## 2015-11-19 ENCOUNTER — Emergency Department: Payer: Federal, State, Local not specified - PPO

## 2015-11-19 ENCOUNTER — Observation Stay
Admission: EM | Admit: 2015-11-19 | Discharge: 2015-11-20 | Disposition: A | Discharge status: Home / Self Care | Payer: Federal, State, Local not specified - PPO | Attending: Internal Medicine | Admitting: Internal Medicine

## 2015-11-19 DIAGNOSIS — R062 Wheezing: Secondary | ICD-10-CM | POA: Insufficient documentation

## 2015-11-19 DIAGNOSIS — H409 Unspecified glaucoma: Secondary | ICD-10-CM | POA: Diagnosis not present

## 2015-11-19 DIAGNOSIS — B029 Zoster without complications: Secondary | ICD-10-CM | POA: Diagnosis not present

## 2015-11-19 DIAGNOSIS — Z7951 Long term (current) use of inhaled steroids: Secondary | ICD-10-CM | POA: Diagnosis not present

## 2015-11-19 DIAGNOSIS — R0600 Dyspnea, unspecified: Principal | ICD-10-CM | POA: Insufficient documentation

## 2015-11-19 DIAGNOSIS — R05 Cough: Secondary | ICD-10-CM | POA: Insufficient documentation

## 2015-11-19 DIAGNOSIS — Z85038 Personal history of other malignant neoplasm of large intestine: Secondary | ICD-10-CM | POA: Diagnosis not present

## 2015-11-19 DIAGNOSIS — Z9981 Dependence on supplemental oxygen: Secondary | ICD-10-CM | POA: Diagnosis not present

## 2015-11-19 DIAGNOSIS — J209 Acute bronchitis, unspecified: Secondary | ICD-10-CM

## 2015-11-19 DIAGNOSIS — Z91041 Radiographic dye allergy status: Secondary | ICD-10-CM | POA: Diagnosis not present

## 2015-11-19 DIAGNOSIS — R0602 Shortness of breath: Secondary | ICD-10-CM | POA: Diagnosis not present

## 2015-11-19 DIAGNOSIS — C3410 Malignant neoplasm of upper lobe, unspecified bronchus or lung: Secondary | ICD-10-CM | POA: Insufficient documentation

## 2015-11-19 DIAGNOSIS — Z79899 Other long term (current) drug therapy: Secondary | ICD-10-CM | POA: Diagnosis not present

## 2015-11-19 DIAGNOSIS — Z85118 Personal history of other malignant neoplasm of bronchus and lung: Secondary | ICD-10-CM | POA: Insufficient documentation

## 2015-11-19 DIAGNOSIS — J449 Chronic obstructive pulmonary disease, unspecified: Secondary | ICD-10-CM | POA: Diagnosis present

## 2015-11-19 DIAGNOSIS — Z8673 Personal history of transient ischemic attack (TIA), and cerebral infarction without residual deficits: Secondary | ICD-10-CM | POA: Diagnosis not present

## 2015-11-19 DIAGNOSIS — Z833 Family history of diabetes mellitus: Secondary | ICD-10-CM | POA: Insufficient documentation

## 2015-11-19 DIAGNOSIS — Z8249 Family history of ischemic heart disease and other diseases of the circulatory system: Secondary | ICD-10-CM | POA: Insufficient documentation

## 2015-11-19 DIAGNOSIS — Z85528 Personal history of other malignant neoplasm of kidney: Secondary | ICD-10-CM | POA: Insufficient documentation

## 2015-11-19 DIAGNOSIS — Z888 Allergy status to other drugs, medicaments and biological substances status: Secondary | ICD-10-CM | POA: Insufficient documentation

## 2015-11-19 DIAGNOSIS — M109 Gout, unspecified: Secondary | ICD-10-CM | POA: Insufficient documentation

## 2015-11-19 DIAGNOSIS — Z8611 Personal history of tuberculosis: Secondary | ICD-10-CM | POA: Diagnosis not present

## 2015-11-19 DIAGNOSIS — Z7984 Long term (current) use of oral hypoglycemic drugs: Secondary | ICD-10-CM | POA: Diagnosis not present

## 2015-11-19 DIAGNOSIS — Z8572 Personal history of non-Hodgkin lymphomas: Secondary | ICD-10-CM | POA: Insufficient documentation

## 2015-11-19 DIAGNOSIS — J440 Chronic obstructive pulmonary disease with acute lower respiratory infection: Secondary | ICD-10-CM | POA: Insufficient documentation

## 2015-11-19 DIAGNOSIS — J9611 Chronic respiratory failure with hypoxia: Secondary | ICD-10-CM | POA: Diagnosis not present

## 2015-11-19 DIAGNOSIS — I10 Essential (primary) hypertension: Secondary | ICD-10-CM | POA: Insufficient documentation

## 2015-11-19 DIAGNOSIS — Z87891 Personal history of nicotine dependence: Secondary | ICD-10-CM | POA: Diagnosis not present

## 2015-11-19 DIAGNOSIS — J441 Chronic obstructive pulmonary disease with (acute) exacerbation: Secondary | ICD-10-CM

## 2015-11-19 DIAGNOSIS — E119 Type 2 diabetes mellitus without complications: Secondary | ICD-10-CM | POA: Diagnosis not present

## 2015-11-19 DIAGNOSIS — S2243XA Multiple fractures of ribs, bilateral, initial encounter for closed fracture: Secondary | ICD-10-CM | POA: Diagnosis not present

## 2015-11-19 DIAGNOSIS — Z8719 Personal history of other diseases of the digestive system: Secondary | ICD-10-CM | POA: Insufficient documentation

## 2015-11-19 DIAGNOSIS — X58XXXS Exposure to other specified factors, sequela: Secondary | ICD-10-CM | POA: Diagnosis not present

## 2015-11-19 DIAGNOSIS — R918 Other nonspecific abnormal finding of lung field: Secondary | ICD-10-CM | POA: Diagnosis not present

## 2015-11-19 DIAGNOSIS — R0603 Acute respiratory distress: Secondary | ICD-10-CM

## 2015-11-19 LAB — CBC WITH DIFFERENTIAL/PLATELET
BASOS PCT: 0 %
Basophils Absolute: 0 10*3/uL (ref 0–0.1)
EOS ABS: 0 10*3/uL (ref 0–0.7)
EOS PCT: 0 %
HEMATOCRIT: 32.8 % — AB (ref 40.0–52.0)
Hemoglobin: 10.4 g/dL — ABNORMAL LOW (ref 13.0–18.0)
Lymphocytes Relative: 25 %
Lymphs Abs: 3 10*3/uL (ref 1.0–3.6)
MCH: 27.5 pg (ref 26.0–34.0)
MCHC: 31.7 g/dL — AB (ref 32.0–36.0)
MCV: 86.7 fL (ref 80.0–100.0)
MONO ABS: 0.1 10*3/uL — AB (ref 0.2–1.0)
Monocytes Relative: 1 %
NEUTROS ABS: 8.7 10*3/uL — AB (ref 1.4–6.5)
Neutrophils Relative %: 74 %
Platelets: 321 10*3/uL (ref 150–440)
RBC: 3.78 MIL/uL — ABNORMAL LOW (ref 4.40–5.90)
RDW: 15.1 % — AB (ref 11.5–14.5)
WBC: 11.9 10*3/uL — ABNORMAL HIGH (ref 3.8–10.6)

## 2015-11-19 LAB — COMPREHENSIVE METABOLIC PANEL
ALK PHOS: 81 U/L (ref 38–126)
ALT: 14 U/L — ABNORMAL LOW (ref 17–63)
AST: 17 U/L (ref 15–41)
Albumin: 3.9 g/dL (ref 3.5–5.0)
Anion gap: 10 (ref 5–15)
BILIRUBIN TOTAL: 0.3 mg/dL (ref 0.3–1.2)
BUN: 17 mg/dL (ref 6–20)
CALCIUM: 9.5 mg/dL (ref 8.9–10.3)
CO2: 32 mmol/L (ref 22–32)
CREATININE: 1.21 mg/dL (ref 0.61–1.24)
Chloride: 95 mmol/L — ABNORMAL LOW (ref 101–111)
GFR calc Af Amer: >60 mL/min (ref >60)
Glucose, Bld: 160 mg/dL — ABNORMAL HIGH (ref 65–99)
POTASSIUM: 5.3 mmol/L — AB (ref 3.5–5.1)
Sodium: 137 mmol/L (ref 135–145)
TOTAL PROTEIN: 8 g/dL (ref 6.5–8.1)

## 2015-11-19 LAB — GLUCOSE, CAPILLARY: Glucose-Capillary: 167 mg/dL — ABNORMAL HIGH (ref 65–99)

## 2015-11-19 LAB — TROPONIN I

## 2015-11-19 LAB — BRAIN NATRIURETIC PEPTIDE: B Natriuretic Peptide: 34 pg/mL (ref 0.0–100.0)

## 2015-11-19 MED ORDER — LEVOFLOXACIN IN D5W 750 MG/150ML IV SOLN
750.0000 mg | Freq: Once | INTRAVENOUS | Status: AC
  Administered 2015-11-19: 750 mg via INTRAVENOUS
  Filled 2015-11-19: qty 150

## 2015-11-19 MED ORDER — PIOGLITAZONE HCL 30 MG PO TABS
30.0000 mg | ORAL_TABLET | Freq: Every day | ORAL | Status: DC
Start: 2015-11-20 — End: 2015-11-20
  Filled 2015-11-19: qty 1

## 2015-11-19 MED ORDER — DOCUSATE SODIUM 100 MG PO CAPS
100.0000 mg | ORAL_CAPSULE | Freq: Two times a day (BID) | ORAL | Status: DC
  Administered 2015-11-19 – 2015-11-20 (×2): 100 mg via ORAL
  Filled 2015-11-19 (×2): qty 1

## 2015-11-19 MED ORDER — FAMOTIDINE IN NACL 20-0.9 MG/50ML-% IV SOLN
20.0000 mg | Freq: Two times a day (BID) | INTRAVENOUS | Status: DC
  Administered 2015-11-19: 20 mg via INTRAVENOUS
  Filled 2015-11-19 (×3): qty 50

## 2015-11-19 MED ORDER — ALBUTEROL SULFATE HFA 108 (90 BASE) MCG/ACT IN AERS: 2.0000 | INHALATION_SPRAY | Freq: Four times a day (QID) | RESPIRATORY_TRACT | Status: DC | PRN

## 2015-11-19 MED ORDER — METHYLPREDNISOLONE SODIUM SUCC 125 MG IJ SOLR
60.0000 mg | Freq: Four times a day (QID) | INTRAMUSCULAR | Status: DC
  Administered 2015-11-19 – 2015-11-20 (×3): 60 mg via INTRAVENOUS
  Filled 2015-11-19 (×3): qty 2

## 2015-11-19 MED ORDER — INSULIN ASPART 100 UNIT/ML ~~LOC~~ SOLN
0.0000 [IU] | Freq: Three times a day (TID) | SUBCUTANEOUS | Status: DC
  Filled 2015-11-19: qty 2

## 2015-11-19 MED ORDER — SODIUM CHLORIDE 0.9 % IV SOLN
INTRAVENOUS | Status: DC
  Administered 2015-11-19: 21:00:00 via INTRAVENOUS

## 2015-11-19 MED ORDER — IPRATROPIUM-ALBUTEROL 0.5-2.5 (3) MG/3ML IN SOLN
3.0000 mL | Freq: Four times a day (QID) | RESPIRATORY_TRACT | Status: DC
  Administered 2015-11-20: 08:00:00 3 mL via RESPIRATORY_TRACT
  Filled 2015-11-19: qty 3

## 2015-11-19 MED ORDER — ONDANSETRON HCL 4 MG PO TABS: 4.0000 mg | ORAL_TABLET | Freq: Four times a day (QID) | ORAL | Status: DC | PRN

## 2015-11-19 MED ORDER — GLIPIZIDE 10 MG PO TABS
10.0000 mg | ORAL_TABLET | Freq: Every day | ORAL | Status: DC
Start: 2015-11-20 — End: 2015-11-20
  Administered 2015-11-20: 10:00:00 10 mg via ORAL
  Filled 2015-11-19: qty 1

## 2015-11-19 MED ORDER — ACETAMINOPHEN 650 MG RE SUPP: 650.0000 mg | Freq: Four times a day (QID) | RECTAL | Status: DC | PRN

## 2015-11-19 MED ORDER — HEPARIN SODIUM (PORCINE) 5000 UNIT/ML IJ SOLN
5000.0000 [IU] | Freq: Three times a day (TID) | INTRAMUSCULAR | Status: DC
  Administered 2015-11-19 – 2015-11-20 (×2): 5000 [IU] via SUBCUTANEOUS
  Filled 2015-11-19 (×2): qty 1

## 2015-11-19 MED ORDER — ALBUTEROL SULFATE (2.5 MG/3ML) 0.083% IN NEBU
2.5000 mg | INHALATION_SOLUTION | Freq: Four times a day (QID) | RESPIRATORY_TRACT | Status: DC | PRN
  Administered 2015-11-20: 04:00:00 2.5 mg via RESPIRATORY_TRACT
  Filled 2015-11-19: qty 3

## 2015-11-19 MED ORDER — LEVOFLOXACIN IN D5W 500 MG/100ML IV SOLN
500.0000 mg | INTRAVENOUS | Status: DC
  Filled 2015-11-19: qty 100

## 2015-11-19 MED ORDER — DIPHENHYDRAMINE HCL 25 MG PO CAPS: 25.0000 mg | ORAL_CAPSULE | Freq: Every day | ORAL | Status: DC | PRN

## 2015-11-19 MED ORDER — HYDROCHLOROTHIAZIDE 12.5 MG PO CAPS
12.5000 mg | ORAL_CAPSULE | Freq: Every day | ORAL | Status: DC
  Administered 2015-11-20: 10:00:00 12.5 mg via ORAL
  Filled 2015-11-19: qty 1

## 2015-11-19 MED ORDER — ACETAMINOPHEN 325 MG PO TABS: 650.0000 mg | ORAL_TABLET | Freq: Four times a day (QID) | ORAL | Status: DC | PRN

## 2015-11-19 MED ORDER — ONDANSETRON HCL 4 MG/2ML IJ SOLN: 4.0000 mg | Freq: Four times a day (QID) | INTRAMUSCULAR | Status: DC | PRN

## 2015-11-19 MED ORDER — BISACODYL 10 MG RE SUPP: 10.0000 mg | Freq: Every day | RECTAL | Status: DC | PRN

## 2015-11-19 MED ORDER — THEOPHYLLINE ER 400 MG PO TB24
400.0000 mg | ORAL_TABLET | Freq: Every day | ORAL | Status: DC
Start: 2015-11-20 — End: 2015-11-20
  Filled 2015-11-19: qty 1

## 2015-11-19 MED ORDER — BUDESONIDE-FORMOTEROL FUMARATE 160-4.5 MCG/ACT IN AERO
2.0000 | INHALATION_SPRAY | Freq: Two times a day (BID) | RESPIRATORY_TRACT | Status: DC
  Administered 2015-11-19 – 2015-11-20 (×2): 2 via RESPIRATORY_TRACT
  Filled 2015-11-19: qty 6

## 2015-11-19 MED ORDER — LOSARTAN POTASSIUM-HCTZ 50-12.5 MG PO TABS: 1.0000 | ORAL_TABLET | Freq: Every day | ORAL | Status: DC

## 2015-11-19 MED ORDER — TIOTROPIUM BROMIDE MONOHYDRATE 18 MCG IN CAPS
18.0000 ug | ORAL_CAPSULE | Freq: Every day | RESPIRATORY_TRACT | Status: DC
  Administered 2015-11-20: 10:00:00 18 ug via RESPIRATORY_TRACT
  Filled 2015-11-19: qty 5

## 2015-11-19 MED ORDER — ALBUTEROL SULFATE (2.5 MG/3ML) 0.083% IN NEBU
5.0000 mg | INHALATION_SOLUTION | Freq: Once | RESPIRATORY_TRACT | Status: AC
  Administered 2015-11-19: 5 mg via RESPIRATORY_TRACT
  Filled 2015-11-19: qty 6

## 2015-11-19 MED ORDER — IPRATROPIUM BROMIDE 0.02 % IN SOLN
0.5000 mg | Freq: Once | RESPIRATORY_TRACT | Status: AC
  Administered 2015-11-19: 0.5 mg via RESPIRATORY_TRACT
  Filled 2015-11-19: qty 2.5

## 2015-11-19 MED ORDER — TEMAZEPAM 7.5 MG PO CAPS
7.5000 mg | ORAL_CAPSULE | Freq: Every evening | ORAL | Status: DC | PRN
  Administered 2015-11-19: 7.5 mg via ORAL
  Filled 2015-11-19: qty 1

## 2015-11-19 MED ORDER — ASPIRIN EC 81 MG PO TBEC
81.0000 mg | DELAYED_RELEASE_TABLET | Freq: Every day | ORAL | Status: DC
  Administered 2015-11-19 – 2015-11-20 (×2): 81 mg via ORAL
  Filled 2015-11-19 (×2): qty 1

## 2015-11-19 MED ORDER — HYDROCODONE-ACETAMINOPHEN 5-325 MG PO TABS
1.0000 | ORAL_TABLET | Freq: Four times a day (QID) | ORAL | Status: DC | PRN
  Administered 2015-11-19: 21:00:00 2 via ORAL
  Filled 2015-11-19: qty 2

## 2015-11-19 MED ORDER — SODIUM CHLORIDE 0.9 % IV BOLUS (SEPSIS)
500.0000 mL | Freq: Once | INTRAVENOUS | Status: AC
  Administered 2015-11-19: 500 mL via INTRAVENOUS

## 2015-11-19 MED ORDER — GUAIFENESIN ER 600 MG PO TB12
600.0000 mg | ORAL_TABLET | Freq: Two times a day (BID) | ORAL | Status: DC
  Administered 2015-11-19: 21:00:00 600 mg via ORAL
  Filled 2015-11-19 (×2): qty 1

## 2015-11-19 MED ORDER — LATANOPROST 0.005 % OP SOLN
1.0000 [drp] | OPHTHALMIC | Status: DC
  Administered 2015-11-19: 1 [drp] via OPHTHALMIC
  Filled 2015-11-19: qty 2.5

## 2015-11-19 MED ORDER — SODIUM CHLORIDE 0.9 % IV BOLUS (SEPSIS)
1000.0000 mL | Freq: Once | INTRAVENOUS | Status: AC
  Administered 2015-11-19: 1000 mL via INTRAVENOUS

## 2015-11-19 MED ORDER — LOSARTAN POTASSIUM 50 MG PO TABS
50.0000 mg | ORAL_TABLET | Freq: Every day | ORAL | Status: DC
  Administered 2015-11-20: 10:00:00 50 mg via ORAL
  Filled 2015-11-19: qty 1

## 2015-11-19 NOTE — ED Notes (Signed)
Pt from home via EMS. C/O SOB, on 3L o2 at home. Hx of lung cancer and COPD. When EMS arrived noticed kink in o2 tubing anf pt not receiving oxygen. Was agitated and using accessory muscles on arrival, once o2 applied pt was in NAD

## 2015-11-19 NOTE — H&P (Signed)
History and Physical    Craig [name]. WUJ:[id number] DOB: [birth year] DOA: 11/19/2015  Referring physician: Dr. Burlene Arnt PCP: [name], MD  Specialists: Dr. Raul Del  Chief Complaint: SOB  HPI: Craig [name]. is a 65 y.o. male has a past medical history significant for lung cancer, COPD, and chronic respiratory failure on 3L O2 per [place] at home who presents to ER with 2 day hx of worsening SOB and cough. In ER, patient was noted to be in respiratory distress with tachypnea. He was given SVN's and IV steroids with some improvement but is still symptomatic. He is now admitted for further evaluation. Denies fever or CP. Some cough with yellow sputum.  Review of Systems: The patient denies anorexia, fever, weight loss,, vision loss, decreased hearing, hoarseness, chest pain, syncope,  peripheral edema, balance deficits, hemoptysis, abdominal pain, melena, hematochezia, severe indigestion/heartburn, hematuria, incontinence, genital sores, muscle weakness, suspicious skin lesions, transient blindness, difficulty walking, depression, unusual weight change, abnormal bleeding, enlarged lymph nodes, angioedema, and breast masses.   Past Medical History  Diagnosis Date  . 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 Cancer ([place])   . Renal cancer ([place])   . Lymphoma (H[facility])     low grade  . COPD (chronic obstructive pulmonary disease) ([place])   . Stroke ([place])   . Hypertension   . Diabetes mellitus without complication ([place])   . Respiratory failure ([place]) 07/12/2010    acute  . Colon cancer ([place])   . Tuberculosis   . Gout   . Glaucoma   . Pancreatitis   . Cholecystitis    History reviewed. No pertinent past surgical history. Social History:  reports that he quit smoking about 5 years ago. He does not have any smokeless tobacco history on file. He reports that he does not drink alcohol. His drug history is not on file.  Allergies  Allergen Reactions  .  Iodine Shortness Of Breath, Diarrhea and Nausea And Vomiting  . Nexium [Esomeprazole Magnesium] Other (See Comments)    headache    Family History  Problem Relation Age of Onset  . Diabetes Mellitus II Mother   . Heart disease Father   . Hypertension Father   . Diabetes Mellitus II Sister     Prior to Admission medications   Medication Sig Start Date End Date Taking? Authorizing Provider  albuterol (PROVENTIL HFA;VENTOLIN HFA) 108 (90 BASE) MCG/ACT inhaler Inhale 2 puffs into the lungs every 6 (six) hours as needed for wheezing or shortness of breath.   Yes Historical Provider, MD  albuterol (PROVENTIL) (2.5 MG/3ML) 0.083% nebulizer solution USE 1 VIAL IN NEBULIZER EVERY 3-4 HOURS AS NEEDED 07/05/15  Yes Forest Gleason, MD  budesonide-formoterol (SYMBICORT) 160-4.5 MCG/ACT inhaler Inhale 2 puffs into the lungs 2 (two) times daily.   Yes Historical Provider, MD  diphenhydrAMINE (BENADRYL) 12.5 MG chewable tablet Chew 25 mg by mouth daily as needed for allergies.   Yes Historical Provider, MD  glipiZIDE (GLUCOTROL) 10 MG tablet TAKE 1 TABLET BY MOUTH DAILY 08/16/15  Yes Forest Gleason, MD  HYDROcodone-acetaminophen (NORCO/VICODIN) 5-325 MG tablet Take 1-2 tablets by mouth every 6 (six) hours as needed for moderate pain. 10/26/15  Yes [name], NP  latanoprost (XALATAN) 0.005 % ophthalmic solution Place 1 drop into both eyes every other day.  04/19/15  Yes Historical Provider, MD  loperamide (IMODIUM A-D) 2 MG tablet Take 2 mg by mouth 4 (four) times daily  as needed for diarrhea or loose stools.   Yes Historical Provider, MD  losartan-hydrochlorothiazide (HYZAAR) 50-12.5 MG per tablet Take 1 tablet by mouth daily.   Yes Historical Provider, MD  pioglitazone (ACTOS) 30 MG tablet Take 30 mg by mouth daily.   Yes Historical Provider, MD  temazepam (RESTORIL) 7.5 MG capsule Take 1-2 capsules by mouth at bedtime as needed for sleep.  05/02/15  Yes Historical Provider, MD  theophylline (UNIPHYL) 400  MG 24 hr tablet Take 400 mg by mouth daily as needed. For shortness of breath. 11/11/15  Yes Historical Provider, MD  tiotropium (SPIRIVA) 18 MCG inhalation capsule Place 18 mcg into inhaler and inhale daily.   Yes Historical Provider, MD  azithromycin (ZITHROMAX) 500 MG tablet Take 1 tablet (500 mg total) by mouth daily. 09/27/15   Forest Gleason, MD  guaiFENesin (MUCINEX) 600 MG 12 hr tablet Take 1 tablet (600 mg total) by mouth 2 (two) times daily. 08/19/15   [name], NP  predniSONE (DELTASONE) 10 MG tablet Take 1 tablet (10 mg total) by mouth daily with breakfast. 10/27/15   Forest Gleason, MD   Physical Exam: Filed Vitals:   11/19/15 1554 11/19/15 1602 11/19/15 1604 11/19/15 1738  BP: 135/97   118/69  Pulse:  118  110  Temp:   98.7 F (37.1 C)   Resp: '26 28  29  '$ Height:   '5\' 8"'$  (1.727 m)   Weight:   70.761 kg (156 lb)   SpO2:  100%  100%     General:  WDWN in moderate respiratory distress, not speaking in full sentences  Eyes: PERRL, EOMI, no scleral icterus, conjunctiva clear  ENT: dry oropharynx with good dentition, OP clear. Nose without discharge  Neck: supple, no lymphadenopathy. No bruits or thyromegaly.  Cardiovascular: rapid rate with regular rhythm without MRG; 2+ peripheral pulses, no JVD, no peripheral edema  Respiratory: decreased breath sounds with diffuse rhonchi. No wheezes or rales. Respiratory effort is increased  Abdomen: soft, non tender to palpation, positive bowel sounds, no guarding, no rebound  Skin: no rashes  Musculoskeletal: normal bulk and tone, no joint swelling  Psychiatric: normal mood and affect, A&OX3  Neurologic: CN 2-12 grossly intact, motor strength 5/5 in all 4 muscle groups. Sensory exam normal. DTR's symmetric  Labs on Admission:  Basic Metabolic Panel:  Recent Labs Lab 11/19/15 1600  NA 137  K 5.3*  CL 95*  CO2 32  GLUCOSE 160*  BUN 17  CREATININE 1.21  CALCIUM 9.5   Liver Function Tests:  Recent Labs Lab  11/19/15 1600  AST 17  ALT 14*  ALKPHOS 81  BILITOT 0.3  PROT 8.0  ALBUMIN 3.9   No results for input(s): LIPASE, AMYLASE in the last 168 hours. No results for input(s): AMMONIA in the last 168 hours. CBC:  Recent Labs Lab 11/19/15 1600  WBC 11.9*  NEUTROABS 8.7*  HGB 10.4*  HCT 32.8*  MCV 86.7  PLT 321   Cardiac Enzymes:  Recent Labs Lab 11/19/15 1600  TROPONINI <0.03    BNP (last 3 results)  Recent Labs  11/19/15 1600  BNP 34.0    ProBNP (last 3 results) No results for input(s): PROBNP in the last 8760 hours.  CBG: No results for input(s): GLUCAP in the last 168 hours.  Radiological Exams on Admission: Dg Chest 2 View  11/19/2015  CLINICAL DATA:  Short of breath.  History of lung cancer. EXAM: CHEST  2 VIEW COMPARISON:  10/26/2015 FINDINGS: COPD with  pulmonary hyperinflation. Negative for pneumonia. Negative for heart failure or effusion History of right upper lobe lung cancer. Linear scarring is present in the area related to prior cancer treatment. This is unchanged. No recurrent mass identified in this area. No interval change. Port-A-Cath tip in the SVC is unchanged. Chronic bilateral rib fractures stable. Mild fracture approximately T12 stable. IMPRESSION: COPD.  No acute abnormality and no interval change Electronically Signed   By: Franchot Gallo M.D.   On: 11/19/2015 16:31    EKG: Independently reviewed.  Assessment/Plan Principal Problem:   Acute respiratory distress (HCC) Active Problems:   COPD exacerbation ([facility])   Acute bronchitis   SOB (shortness of breath)   Will admit to floor with O2, SVN's, IV steroids, and IV ABX. Follow sugars. Cultures sent. Continue routine outpatient meds for now  Diet: carb modified Fluids: NS'@75'$   DVT Prophylaxis: SQ Heparin  Code Status: FULL  Family Communication: none  Disposition Plan: home  Time spent: 50 min

## 2015-11-19 NOTE — ED Provider Notes (Addendum)
[facility] Emergency Department Provider Note  ____________________________________________   I have reviewed the triage vital signs and the nursing notes.   HISTORY  Chief Complaint Shortness of Breath    HPI Craig [name]. is a 65 y.o. male with a history of cancer, COPD, history of lymphoma, not currently getting chemotherapy. Patient is on 3 L home oxygen all the time. He states that he has had no fever, he has had no productive cough but he has had increased wheeze and dry cough over the last 2 days. Apparently, he was satting very low on EMS arrival but his oxygen was not fully functioning. Patient states that even before there was a problem with his oxygen at home, he was wheezing and coughing more than normal. Patient no longer smokes tobacco. He denies leg swelling or fever.  Past Medical History  Diagnosis Date  . 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 Cancer ([place])   . Renal cancer ([place])   . Lymphoma (HCC)     low grade  . COPD (chronic obstructive pulmonary disease) ([place])   . Stroke ([place])   . Hypertension   . Diabetes mellitus without complication ([place])   . Respiratory failure ([place]) 07/12/2010    acute  . Colon cancer ([place])   . Tuberculosis   . Gout   . Glaucoma   . Pancreatitis   . Cholecystitis     Patient Active Problem List   Diagnosis Date Noted  . Glaucoma 04/15/2015  . 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 BP (high blood pressure) 03/02/2015  . Cannot sleep 03/02/2015  . Lung cancer, upper lobe ([place]) 02/03/2015  . Renal cell cancer ([place]) 02/03/2015  . 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 No pertinent past surgical history.  Current Outpatient Rx  Name  Route  Sig  Dispense  Refill  . albuterol (PROVENTIL HFA;VENTOLIN HFA) 108 (90 BASE) MCG/ACT inhaler   Inhalation   Inhale 2 puffs into the lungs every 6 (six) hours as needed for wheezing or shortness of breath.         Marland Kitchen albuterol (PROVENTIL) (2.5 MG/3ML) 0.083% nebulizer solution      USE 1 VIAL IN NEBULIZER EVERY 3-4 HOURS AS NEEDED   75 mL   3   . azithromycin (ZITHROMAX) 500 MG tablet   Oral   Take 1 tablet (500 mg total) by mouth daily.   5 tablet   0   . budesonide-formoterol (SYMBICORT) 160-4.5 MCG/ACT inhaler   Inhalation   Inhale 2 puffs into the lungs 2 (two) times daily.         . diphenhydrAMINE (BENADRYL) 12.5 MG chewable tablet   Oral   Chew 25 mg by mouth daily as needed for allergies.         Marland Kitchen glipiZIDE (GLUCOTROL) 10 MG tablet      TAKE 1 TABLET BY MOUTH DAILY   30 tablet   6   . guaiFENesin (MUCINEX) 600 MG 12 hr tablet   Oral   Take 1 tablet (600 mg total) by mouth 2 (two) times daily.   60 tablet   0   . HYDROcodone-acetaminophen (NORCO/VICODIN) 5-325 MG tablet   Oral   Take 1-2 tablets by mouth every 6 (six) hours as needed for moderate pain.   90 tablet   0   . ipratropium-albuterol (DUONEB) 0.5-2.5 (3) MG/3ML SOLN   Nebulization   Take 3  mLs by nebulization every 6 (six) hours as needed (shortness of breath).         . latanoprost (XALATAN) 0.005 % ophthalmic solution               . loperamide (IMODIUM A-D) 2 MG tablet   Oral   Take 2 mg by mouth 4 (four) times daily as needed for diarrhea or loose stools.         Marland Kitchen losartan-hydrochlorothiazide (HYZAAR) 50-12.5 MG per tablet   Oral   Take 1 tablet by mouth daily.         . pioglitazone (ACTOS) 30 MG tablet   Oral   Take 30 mg by mouth daily.         . predniSONE (DELTASONE) 10 MG tablet   Oral   Take 1 tablet (10 mg total) by mouth daily with breakfast.   30 tablet   3   . temazepam (RESTORIL) 7.5 MG capsule   Oral   Take 1-2 capsules by mouth at bedtime as needed.      2   . theophylline (UNIPHYL) 400 MG 24 hr tablet               . tiotropium (SPIRIVA) 18 MCG inhalation capsule   Inhalation   Place 18 mcg into inhaler and inhale daily.         .  Travoprost, BAK Free, (TRAVATAN) 0.004 % SOLN ophthalmic solution      1 drop at bedtime.         Marland Kitchen zolpidem (AMBIEN) 10 MG tablet   Oral   Take 10 mg by mouth at bedtime as needed for sleep.           Allergies Iodine and Nexium  No family history on file.  Social History Social History  Substance Use Topics  . Smoking status: Former Smoker -- 1.00 packs/day for 25 years    Quit date: 01/13/2010  . Smokeless tobacco: None  . Alcohol Use: No    Review of Systems Constitutional: No fever/chills Eyes: No visual changes. ENT: No sore throat. No stiff neck no neck pain Cardiovascular: Denies chest pain. Respiratory: See history of present illness regarding shortness of breath. Gastrointestinal:   no vomiting.  No diarrhea.  No constipation. Genitourinary: Negative for dysuria. Musculoskeletal: Negative lower extremity swelling Skin: Negative for rash. Neurological: Negative for headaches, focal weakness or numbness. 10-point ROS otherwise negative.  ____________________________________________   PHYSICAL EXAM:  VITAL SIGNS: ED Triage Vitals  Enc Vitals Group     BP 11/19/15 1554 135/97 mmHg     Pulse Rate 11/19/15 1602 118     Resp 11/19/15 1554 26     Temp 11/19/15 1604 98.7 F (37.1 C)     Temp src --      SpO2 11/19/15 1602 100 %     Weight 11/19/15 1604 156 lb (70.761 kg)     Height 11/19/15 1604 '5\' 8"'$  (1.727 m)     Head Cir --      Peak Flow --      Pain Score --      Pain Loc --      Pain Edu? --      Excl. in [place]? --     Constitutional: Alert and oriented. Chronically ill appearing, appears to be in some  moderate respiratory distress Eyes: Conjunctivae are normal. PERRL. EOMI. Head: Atraumatic. Nose: No congestion/rhinnorhea. Mouth/Throat: Mucous membranes are moist.  Oropharynx non-erythematous. Neck: No stridor.  Nontender with no meningismus Cardiovascular: Tachycardia noted, regular rhythm. Grossly normal heart sounds.  Good peripheral  circulation. Respiratory: Diffuse significant wheeze noted, no retractions but dyspnea noted. Patient with mild accessory muscle use. Not tripoding however. No rales or rhonchi. Abdominal: Soft and nontender. No distention. No guarding no rebound Back:  There is no focal tenderness or step off there is no midline tenderness there are no lesions noted. there is no CVA tenderness Musculoskeletal: No lower extremity tenderness. No joint effusions, no DVT signs strong distal pulses no edema Neurologic:  Normal speech and language. No gross focal neurologic deficits are appreciated.  Skin:  Skin is warm, dry and intact. No rash noted. Psychiatric: Mood and affect are normal. Speech and behavior are normal.  ____________________________________________   LABS (all labs ordered are listed, but only abnormal results are displayed)  Labs Reviewed  CBC WITH DIFFERENTIAL/PLATELET - Abnormal; Notable for the following:    WBC 11.9 (*)    RBC 3.78 (*)    Hemoglobin 10.4 (*)    HCT 32.8 (*)    MCHC 31.7 (*)    RDW 15.1 (*)    Neutro Abs 8.7 (*)    Monocytes Absolute 0.1 (*)    All other components within normal limits  CULTURE, BLOOD (ROUTINE X 2)  CULTURE, BLOOD (ROUTINE X 2)  COMPREHENSIVE METABOLIC PANEL  TROPONIN I  BRAIN NATRIURETIC PEPTIDE   ____________________________________________  EKG  I personally interpreted any EKGs ordered by me or triage Normal sinus tachycardia rate 118 bpm no acute ST elevation or acute ST depression nonspecific ST changes. ____________________________________________  [id number]  I reviewed any imaging ordered by me or triage that were performed during my shift ____________________________________________   PROCEDURES  Procedure(s) performed: None  Critical Care performed: CRITICAL CARE Performed by: Schuyler Amor   Total critical care time: 38 minutes  Critical care time was exclusive of separately billable procedures and treating other  patients.  Critical care was necessary to treat or prevent imminent or life-threatening deterioration.  Critical care was time spent personally by me on the following activities: development of treatment plan with patient and/or surrogate as well as nursing, discussions with consultants, evaluation of patient's response to treatment, examination of patient, obtaining history from patient or surrogate, ordering and performing treatments and interventions, ordering and review of laboratory studies, ordering and review of radiographic studies, pulse oximetry and re-evaluation of patient's condition.   ____________________________________________   INITIAL IMPRESSION / ASSESSMENT AND PLAN / ED COURSE  Pertinent labs & imaging results that were available during my care of the patient were reviewed by me and considered in my medical decision making (see chart for details). Patient with history of COPD presents with significant wheeze and dry cough. I did offer him BiPAP but he declines. We are giving him albuterol treatments. Steroids, chest x-ray will check blood work and reassess. He does not appear to be in imminent danger of intubation although he is in some degree of respiratory discomfort  ----------------------------------------- 4:32 PM on 11/19/2015 -----------------------------------------  Patient receiving nebulizer at this time, moving slightly better air, more relaxed, awaiting chest x-ray and blood work.  ----------------------------------------- 5:24 PM on 11/19/2015 -----------------------------------------  Patient moving better air lungs much clearer. We'll admit to the hospital given rest for distress upon arrival. We'll appear to be treated for possible infectious etiology given mildly elevated white count. ____________________________________________   FINAL CLINICAL IMPRESSION(S) / ED DIAGNOSES  Final diagnoses:  None      This chart  was dictated using voice  recognition software.  Despite best efforts to proofread,  errors can occur which can change meaning.     Schuyler Amor, MD 11/19/15 1633  Schuyler Amor, MD 11/19/15 [id number]

## 2015-11-19 NOTE — ED Notes (Signed)
Patient transported to X-ray 

## 2015-11-20 LAB — COMPREHENSIVE METABOLIC PANEL
ALK PHOS: 64 U/L (ref 38–126)
ALT: 13 U/L — ABNORMAL LOW (ref 17–63)
AST: 16 U/L (ref 15–41)
Albumin: 3.4 g/dL — ABNORMAL LOW (ref 3.5–5.0)
Anion gap: 10 (ref 5–15)
BILIRUBIN TOTAL: 0.4 mg/dL (ref 0.3–1.2)
BUN: 23 mg/dL — AB (ref 6–20)
CALCIUM: 8.5 mg/dL — AB (ref 8.9–10.3)
CO2: 28 mmol/L (ref 22–32)
CREATININE: 1.3 mg/dL — AB (ref 0.61–1.24)
Chloride: 100 mmol/L — ABNORMAL LOW (ref 101–111)
GFR calc Af Amer: >60 mL/min (ref >60)
GFR, EST NON AFRICAN AMERICAN: 56 mL/min — AB (ref >60)
Glucose, Bld: 183 mg/dL — ABNORMAL HIGH (ref 65–99)
POTASSIUM: 4.9 mmol/L (ref 3.5–5.1)
Sodium: 138 mmol/L (ref 135–145)
TOTAL PROTEIN: 7.1 g/dL (ref 6.5–8.1)

## 2015-11-20 LAB — CBC
HEMATOCRIT: 28.9 % — AB (ref 40.0–52.0)
Hemoglobin: 9.4 g/dL — ABNORMAL LOW (ref 13.0–18.0)
MCH: 27.5 pg (ref 26.0–34.0)
MCHC: 32.4 g/dL (ref 32.0–36.0)
MCV: 85 fL (ref 80.0–100.0)
Platelets: 277 10*3/uL (ref 150–440)
RBC: 3.4 MIL/uL — ABNORMAL LOW (ref 4.40–5.90)
RDW: 15.1 % — AB (ref 11.5–14.5)
WBC: 9 10*3/uL (ref 3.8–10.6)

## 2015-11-20 LAB — GLUCOSE, CAPILLARY
GLUCOSE-CAPILLARY: 163 mg/dL — AB (ref 65–99)
Glucose-Capillary: 227 mg/dL — ABNORMAL HIGH (ref 65–99)

## 2015-11-20 MED ORDER — LEVOFLOXACIN 500 MG PO TABS: 500.0000 mg | ORAL_TABLET | Freq: Every day | ORAL | Status: DC

## 2015-11-20 MED ORDER — PREDNISONE 10 MG PO TABS: 10.0000 mg | ORAL_TABLET | Freq: Every day | ORAL | Status: DC

## 2015-11-20 NOTE — Progress Notes (Signed)
Pt is alert and oriented, denies pain, ambulatory in room, fair appetite, family member at bedside, pt is d/c to home, on 3 L oxygen, home portable tank at bedside, pt refuses multiple am medications and reports he will take them at home per his normal schedule. Prescriptions faxed to [facility], pt reports he understands d/c instructions and has no further question at this time. Uneventful shift.

## 2015-11-20 NOTE — Discharge Summary (Signed)
[place] [facility] [place] NAME: Craig [name]    MR#:  [id number]  DATE OF BIRTH:  October 26, 1950  DATE OF ADMISSION:  11/19/2015 ADMITTING PHYSICIAN: Idelle Crouch, MD  DATE OF DISCHARGE: 11/20/2015  PRIMARY CARE PHYSICIAN: [name], MD    ADMISSION DIAGNOSIS:  COPD with respiratory distress, acute ([place]) [J44.9]  DISCHARGE DIAGNOSIS:  Principal Problem:   Acute respiratory distress (HCC) Active Problems:   COPD exacerbation (HCC)   Acute bronchitis   SOB (shortness of breath)   SECONDARY DIAGNOSIS:   Past Medical History  Diagnosis Date  . Lung cancer ([place])     right upper lobe mass positive for adenocarcinoma  . Cancer ([place])   . Renal cancer ([place])   . Lymphoma (HCC)     low grade  . COPD (chronic obstructive pulmonary disease) ([place])   . Stroke ([place])   . Hypertension   . Diabetes mellitus without complication ([place])   . Respiratory failure ([place]) 07/12/2010    acute  . Colon cancer ([place])   . Tuberculosis   . Gout   . Glaucoma   . Pancreatitis   . Cholecystitis     HOSPITAL COURSE:   65 year old male with chronic respiratory failure on 3 L oxygen presented with COPD exacerbation. For further details please refer the H&P.  1. Acute on chronic hypoxic respiratory failure: Patient was treated for COPD exacerbation. Patient is now back on his baseline 3 L of oxygen.  2. Acute COPD exacerbation: Patient symptoms have improved. Patient was started on IV steroids and transitioned to oral steroids at discharge. Patient will continue on Levaquin for 4 more days. Chest x-ray showed no evidence of pneumonia. Continue nebulizer, the off: and inhalers at discharge.  3. Diabetes without complication: Patient may resume his outpatient medications including Actos and glipizide and ADA diet  4. Essential hypertension: Continue Hyzaar DISCHARGE CONDITIONS AND DIET:   Patient is stable for discharge home on a heart healthy  diet  CONSULTS OBTAINED:     DRUG ALLERGIES:   Allergies  Allergen Reactions  . Iodine Shortness Of Breath, Diarrhea and Nausea And Vomiting  . Nexium [Esomeprazole Magnesium] Other (See Comments)    headache    DISCHARGE MEDICATIONS:   Current Discharge Medication List    START taking these medications   Details  levofloxacin (LEVAQUIN) 500 MG tablet Take 1 tablet (500 mg total) by mouth daily. Qty: 4 tablet, Refills: 0      CONTINUE these medications which have CHANGED   Details  predniSONE (DELTASONE) 10 MG tablet Take 1 tablet (10 mg total) by mouth daily with breakfast. 60 mg PO (ORAL)  x 2 days 50 mg PO x 2 days 40 mg PO x 2 days 30 mg PO x 2 days 20 mg PO x 2 days 10 mg PO x 2 days then stop Qty: 20 tablet, Refills: 0      CONTINUE these medications which have NOT CHANGED   Details  albuterol (PROVENTIL HFA;VENTOLIN HFA) 108 (90 BASE) MCG/ACT inhaler Inhale 2 puffs into the lungs every 6 (six) hours as needed for wheezing or shortness of breath.    albuterol (PROVENTIL) (2.5 MG/3ML) 0.083% nebulizer solution USE 1 VIAL IN NEBULIZER EVERY 3-4 HOURS AS NEEDED Qty: 75 mL, Refills: 3    budesonide-formoterol (SYMBICORT) 160-4.5 MCG/ACT inhaler Inhale 2 puffs into the lungs 2 (two) times daily.    diphenhydrAMINE (BENADRYL) 12.5 MG chewable tablet Chew 25 mg by mouth  daily as needed for allergies.    glipiZIDE (GLUCOTROL) 10 MG tablet TAKE 1 TABLET BY MOUTH DAILY Qty: 30 tablet, Refills: 6    HYDROcodone-acetaminophen (NORCO/VICODIN) 5-325 MG tablet Take 1-2 tablets by mouth every 6 (six) hours as needed for moderate pain. Qty: 90 tablet, Refills: 0   Associated Diagnoses: Malignant neoplasm of left lung, unspecified part of lung (HCC)    latanoprost (XALATAN) 0.005 % ophthalmic solution Place 1 drop into both eyes every other day.    Associated Diagnoses: Malignant neoplasm of left lung, unspecified part of lung (HCC)    loperamide (IMODIUM A-D) 2 MG tablet  Take 2 mg by mouth 4 (four) times daily as needed for diarrhea or loose stools.    losartan-hydrochlorothiazide (HYZAAR) 50-12.5 MG per tablet Take 1 tablet by mouth daily.    pioglitazone (ACTOS) 30 MG tablet Take 30 mg by mouth daily.    temazepam (RESTORIL) 7.5 MG capsule Take 1-2 capsules by mouth at bedtime as needed for sleep.  Refills: 2   Associated Diagnoses: Malignant neoplasm of left lung, unspecified part of lung (HCC)    theophylline (UNIPHYL) 400 MG 24 hr tablet Take 400 mg by mouth daily as needed. For shortness of breath.    tiotropium (SPIRIVA) 18 MCG inhalation capsule Place 18 mcg into inhaler and inhale daily.    guaiFENesin (MUCINEX) 600 MG 12 hr tablet Take 1 tablet (600 mg total) by mouth 2 (two) times daily. Qty: 60 tablet, Refills: 0      STOP taking these medications     azithromycin (ZITHROMAX) 500 MG tablet               Today   CHIEF COMPLAINT:  Patient reports he is feeling 100% better. Patient still has a cough however shortness of breath and wheezing have improved.   VITAL SIGNS:  Blood pressure 127/70, pulse 87, temperature 97.7 F (36.5 C), temperature source Oral, resp. rate 18, height '5\' 8"'$  (1.727 m), weight 67.178 kg (148 lb 1.6 oz), SpO2 100 %.   REVIEW OF SYSTEMS:  Review of Systems  Constitutional: Negative for fever, chills and malaise/fatigue.  HENT: Negative for sore throat.   Eyes: Negative for blurred vision.  Respiratory: Positive for cough. Negative for hemoptysis, shortness of breath and wheezing.   Cardiovascular: Negative for chest pain, palpitations and leg swelling.  Gastrointestinal: Negative for nausea, vomiting, abdominal pain, diarrhea and blood in stool.  Genitourinary: Negative for dysuria.  Musculoskeletal: Negative for back pain.  Neurological: Negative for dizziness, tremors and headaches.  Endo/Heme/Allergies: Does not bruise/bleed easily.     PHYSICAL EXAMINATION:  GENERAL:  65 y.o.-year-old  patient lying in the bed with no acute distress.  NECK:  Supple, no jugular venous distention. No thyroid enlargement, no tenderness.  LUNGS: Normal breath sounds bilaterally, no wheezing, rales,rhonchi  No use of accessory muscles of respiration.  CARDIOVASCULAR: S1, S2 normal. No murmurs, rubs, or gallops.  ABDOMEN: Soft, non-tender, non-distended. Bowel sounds present. No organomegaly or mass.  EXTREMITIES: No pedal edema, cyanosis, or clubbing.  PSYCHIATRIC: The patient is alert and oriented x 3.  SKIN: No obvious rash, lesion, or ulcer.   DATA REVIEW:   CBC  Recent Labs Lab 11/20/15 0649  WBC 9.0  HGB 9.4*  HCT 28.9*  PLT 277    Chemistries   Recent Labs Lab 11/20/15 0649  NA 138  K 4.9  CL 100*  CO2 28  GLUCOSE 183*  BUN 23*  CREATININE 1.30*  CALCIUM 8.5*  AST 16  ALT 13*  ALKPHOS 64  BILITOT 0.4    Cardiac Enzymes  Recent Labs Lab 11/19/15 1600  TROPONINI <0.03    Microbiology Results  '@[id number]'$ @  RADIOLOGY:  Dg Chest 2 View  11/19/2015  CLINICAL DATA:  Short of breath.  History of lung cancer. EXAM: CHEST  2 VIEW COMPARISON:  10/26/2015 FINDINGS: COPD with pulmonary hyperinflation. Negative for pneumonia. Negative for heart failure or effusion History of right upper lobe lung cancer. Linear scarring is present in the area related to prior cancer treatment. This is unchanged. No recurrent mass identified in this area. No interval change. Port-A-Cath tip in the SVC is unchanged. Chronic bilateral rib fractures stable. Mild fracture approximately T12 stable. IMPRESSION: COPD.  No acute abnormality and no interval change Electronically Signed   By: Franchot Gallo M.D.   On: 11/19/2015 16:31      Management plans discussed with the patient and he is in agreement. Stable for discharge   Patient should follow up with PCP 1 week Dr. Raul Del one week  CODE STATUS:     Code Status Orders        Start     Ordered   11/19/15 1953  Full code    Continuous     11/19/15 1953    Code Status History    Date Active Date Inactive Code Status Order ID Comments User Context   This patient has a current code status but no historical code status.      TOTAL TIME TAKING CARE OF THIS PATIENT: 35 minutes.    Note: This dictation was prepared with Dragon dictation along with smaller phrase technology. Any transcriptional errors that result from this process are unintentional.  Precious Segall M.D on 11/20/2015 at 9:44 AM  Between 7am to 6pm - Pager - [phone] After 6pm go to [email or web address] - password [email or web address] [facility]  Office  [phone]  CC: Primary care physician; [name], MD

## 2015-11-22 ENCOUNTER — Telehealth: Payer: Self-pay | Admitting: *Deleted

## 2015-11-22 DIAGNOSIS — C3492 Malignant neoplasm of unspecified part of left bronchus or lung: Secondary | ICD-10-CM

## 2015-11-22 MED ORDER — HYDROCODONE-ACETAMINOPHEN 5-325 MG PO TABS: 1.0000 | ORAL_TABLET | Freq: Four times a day (QID) | ORAL | Status: DC | PRN

## 2015-11-22 NOTE — Telephone Encounter (Signed)
Informed that prescription is ready to pick up Informed of date and time of PET and lab/fu appts as well

## 2015-11-25 LAB — CULTURE, BLOOD (ROUTINE X 2)
CULTURE: NO GROWTH
Culture: NO GROWTH

## 2015-11-30 ENCOUNTER — Ambulatory Visit
Admission: RE | Admit: 2015-11-30 | Discharge: 2015-11-30 | Disposition: A | Discharge status: Home / Self Care | Payer: Federal, State, Local not specified - PPO | Source: Ambulatory Visit | Attending: Family Medicine | Admitting: Family Medicine

## 2015-11-30 DIAGNOSIS — R590 Localized enlarged lymph nodes: Secondary | ICD-10-CM | POA: Diagnosis not present

## 2015-11-30 DIAGNOSIS — I2510 Atherosclerotic heart disease of native coronary artery without angina pectoris: Secondary | ICD-10-CM | POA: Diagnosis not present

## 2015-11-30 DIAGNOSIS — I700 Atherosclerosis of aorta: Secondary | ICD-10-CM | POA: Diagnosis not present

## 2015-11-30 DIAGNOSIS — C649 Malignant neoplasm of unspecified kidney, except renal pelvis: Secondary | ICD-10-CM | POA: Diagnosis present

## 2015-11-30 DIAGNOSIS — N2889 Other specified disorders of kidney and ureter: Secondary | ICD-10-CM | POA: Diagnosis not present

## 2015-11-30 DIAGNOSIS — C3492 Malignant neoplasm of unspecified part of left bronchus or lung: Secondary | ICD-10-CM | POA: Diagnosis not present

## 2015-11-30 LAB — GLUCOSE, CAPILLARY: GLUCOSE-CAPILLARY: 101 mg/dL — AB (ref 65–99)

## 2015-11-30 MED ORDER — FLUDEOXYGLUCOSE F - 18 (FDG) INJECTION
12.4000 | Freq: Once | INTRAVENOUS | Status: AC | PRN
  Administered 2015-11-30: 12.4 via INTRAVENOUS

## 2015-12-07 ENCOUNTER — Inpatient Hospital Stay (HOSPITAL_BASED_OUTPATIENT_CLINIC_OR_DEPARTMENT_OTHER): Payer: Federal, State, Local not specified - PPO | Admitting: Oncology

## 2015-12-07 ENCOUNTER — Encounter: Payer: Self-pay | Admitting: Oncology

## 2015-12-07 ENCOUNTER — Inpatient Hospital Stay: Payer: Federal, State, Local not specified - PPO | Attending: Oncology

## 2015-12-07 VITALS — BP 112/76 | HR 102 | Temp 96.4°F | Wt 150.5 lb

## 2015-12-07 DIAGNOSIS — Z85038 Personal history of other malignant neoplasm of large intestine: Secondary | ICD-10-CM

## 2015-12-07 DIAGNOSIS — C649 Malignant neoplasm of unspecified kidney, except renal pelvis: Secondary | ICD-10-CM | POA: Diagnosis not present

## 2015-12-07 DIAGNOSIS — I10 Essential (primary) hypertension: Secondary | ICD-10-CM | POA: Diagnosis not present

## 2015-12-07 DIAGNOSIS — Z8719 Personal history of other diseases of the digestive system: Secondary | ICD-10-CM | POA: Insufficient documentation

## 2015-12-07 DIAGNOSIS — Z87891 Personal history of nicotine dependence: Secondary | ICD-10-CM

## 2015-12-07 DIAGNOSIS — Z9221 Personal history of antineoplastic chemotherapy: Secondary | ICD-10-CM | POA: Insufficient documentation

## 2015-12-07 DIAGNOSIS — C9110 Chronic lymphocytic leukemia of B-cell type not having achieved remission: Secondary | ICD-10-CM | POA: Insufficient documentation

## 2015-12-07 DIAGNOSIS — Z8781 Personal history of (healed) traumatic fracture: Secondary | ICD-10-CM | POA: Insufficient documentation

## 2015-12-07 DIAGNOSIS — Z79899 Other long term (current) drug therapy: Secondary | ICD-10-CM | POA: Diagnosis not present

## 2015-12-07 DIAGNOSIS — J449 Chronic obstructive pulmonary disease, unspecified: Secondary | ICD-10-CM

## 2015-12-07 DIAGNOSIS — Z7984 Long term (current) use of oral hypoglycemic drugs: Secondary | ICD-10-CM | POA: Insufficient documentation

## 2015-12-07 DIAGNOSIS — R590 Localized enlarged lymph nodes: Secondary | ICD-10-CM | POA: Diagnosis not present

## 2015-12-07 DIAGNOSIS — Z8611 Personal history of tuberculosis: Secondary | ICD-10-CM | POA: Insufficient documentation

## 2015-12-07 DIAGNOSIS — C3411 Malignant neoplasm of upper lobe, right bronchus or lung: Secondary | ICD-10-CM | POA: Insufficient documentation

## 2015-12-07 DIAGNOSIS — E119 Type 2 diabetes mellitus without complications: Secondary | ICD-10-CM | POA: Diagnosis not present

## 2015-12-07 DIAGNOSIS — Z8673 Personal history of transient ischemic attack (TIA), and cerebral infarction without residual deficits: Secondary | ICD-10-CM

## 2015-12-07 DIAGNOSIS — C3492 Malignant neoplasm of unspecified part of left bronchus or lung: Secondary | ICD-10-CM

## 2015-12-07 DIAGNOSIS — C641 Malignant neoplasm of right kidney, except renal pelvis: Secondary | ICD-10-CM

## 2015-12-07 LAB — CBC WITH DIFFERENTIAL/PLATELET
BASOS PCT: 1 %
Basophils Absolute: 0 10*3/uL (ref 0–0.1)
EOS ABS: 0.4 10*3/uL (ref 0–0.7)
EOS PCT: 5 %
HEMATOCRIT: 34.7 % — AB (ref 40.0–52.0)
Hemoglobin: 11.1 g/dL — ABNORMAL LOW (ref 13.0–18.0)
Lymphocytes Relative: 37 %
Lymphs Abs: 3.1 10*3/uL (ref 1.0–3.6)
MCH: 27.5 pg (ref 26.0–34.0)
MCHC: 31.9 g/dL — AB (ref 32.0–36.0)
MCV: 86.3 fL (ref 80.0–100.0)
MONO ABS: 0.4 10*3/uL (ref 0.2–1.0)
MONOS PCT: 4 %
NEUTROS ABS: 4.4 10*3/uL (ref 1.4–6.5)
Neutrophils Relative %: 53 %
Platelets: 148 10*3/uL — ABNORMAL LOW (ref 150–440)
RBC: 4.02 MIL/uL — ABNORMAL LOW (ref 4.40–5.90)
RDW: 16 % — AB (ref 11.5–14.5)
WBC: 8.3 10*3/uL (ref 3.8–10.6)

## 2015-12-07 LAB — COMPREHENSIVE METABOLIC PANEL
ALBUMIN: 3.7 g/dL (ref 3.5–5.0)
ALT: 15 U/L — ABNORMAL LOW (ref 17–63)
ANION GAP: 5 (ref 5–15)
AST: 13 U/L — ABNORMAL LOW (ref 15–41)
Alkaline Phosphatase: 58 U/L (ref 38–126)
BILIRUBIN TOTAL: 0.4 mg/dL (ref 0.3–1.2)
BUN: 22 mg/dL — ABNORMAL HIGH (ref 6–20)
CO2: 37 mmol/L — ABNORMAL HIGH (ref 22–32)
Calcium: 8.9 mg/dL (ref 8.9–10.3)
Chloride: 95 mmol/L — ABNORMAL LOW (ref 101–111)
Creatinine, Ser: 1.17 mg/dL (ref 0.61–1.24)
GFR calc Af Amer: >60 mL/min (ref >60)
GFR calc non Af Amer: >60 mL/min (ref >60)
GLUCOSE: 189 mg/dL — AB (ref 65–99)
POTASSIUM: 4.5 mmol/L (ref 3.5–5.1)
Sodium: 137 mmol/L (ref 135–145)
TOTAL PROTEIN: 6.7 g/dL (ref 6.5–8.1)

## 2015-12-07 MED ORDER — HYDROCODONE-ACETAMINOPHEN 5-325 MG PO TABS: 1.0000 | ORAL_TABLET | Freq: Four times a day (QID) | ORAL | Status: DC | PRN

## 2015-12-07 NOTE — Progress Notes (Signed)
Patient requesting refill for oxycodone.

## 2015-12-07 NOTE — Progress Notes (Signed)
Informed that prescription is ready to pick up  [place] @ [facility] [phone]  Fax:([phone]) [place]. OB: Dec 09, 1950  MR#: [id number]  ZHY#:[id number]  Patient Care Team: [name], MD as PCP - General (Family Medicine)  CHIEF COMPLAINT:  No chief complaint on file.   Oncology History   1. Right upper lobe lung mass biopsies positive for adenocarcinoma.   Based on PET scan patient has T2, N2, M0 tumor stage III a 2. Poor pulmonary function 3. Patient was started on carboplatinum Alimta and Avastin   on april  9th of 2013 4. Patient had an allergic reaction to carboplatinum with acute bronchospasm in May of 2013.  Patient is now being taken off carboplatinum. 5. Started on maintenance Alimta and Avastin  from June of 2013 hold chemotherapy because of increasing shortness of breath (in July, 2013) 6.VARISTRAT test is good started on Tarceva February 3 about 2014 diarrhea 4 days after Tarceva was started.  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 9.bnormal CT and MRI scan of the right kidneywith solid massin the upper pole right kidney 10.biopsy of renal masses consistent with primary renal cell cancer 11.  Patient started on  NIVOLULAMAB August 09, 2014 12 NIVOLULAMAB has been put on hold from August of 2016 because of   PROGRESSIVE  respiratory difficulty     Lung cancer, upper lobe ([place])   02/03/2015 Initial Diagnosis Lung cancer, upper lobe    Oncology Flowsheet 02/28/2015 03/24/2015 04/14/2015 04/28/2015 11/19/2015 11/20/2015 11/20/2015  methylPREDNISolone sodium succinate 125 mg/2 mL (SOLU-MEDROL) IV - - - - 60 mg 60 mg 60 mg  nivolumab (OPDIVO) IV 200 mg 200 mg 200 mg 200 mg - - -    INTERVAL HISTORY:   65 year old African-American gentleman came today further follow-up patient has multiple medical illnesses  including lung cancer.  History of renal cancer.  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  Doing relatively stable Patient went to emergency room for evaluation with increased shortness of breath and cough and was hospitalized for one day for IV for IV antibiotics Patient recently had a PET scan done which has been evaluated     REVIEW OF SYSTEMS:   Gen. status: Patient's performance status is stable in wheelchair Lungs: Increasing shortness of breath.  Cough.    No chills or fever Cardiac: No chest pain or paroxysmal nocturnal dyspnea Abdomen: No nausea no vomiting no diarrhea GU: No hematuria dysuria Lower extremity no swelling Skin: No rash Neurological system no headache no dizziness Pain has improved taking Vicodin  As per HPI. Otherwise, a complete review of systems is negatve.  PAST MEDICAL HISTORY: Past Medical History  Diagnosis Date  . Lung cancer ([place])     right upper lobe mass positive for adenocarcinoma  . Cancer ([place])   . Renal cancer ([place])   . Lymphoma ([facility])     low grade  . COPD (chronic obstructive pulmonary disease) ([place])   . Stroke ([place])   . Hypertension   . Diabetes mellitus without complication ([place])   . Respiratory failure ([place]) 07/12/2010    acute  . Colon cancer ([place])   . Tuberculosis   . Gout   . Glaucoma   . Pancreatitis   . Cholecystitis     PAST SURGICAL HIST0RY  COPD:    acute resp failure: 12-Jul-2010   lung ca:    CVA/Stroke:    HTN:    Diabetes Mellitus, Type II ([facility]):   Smoking History: Smoking History 3(1)quit Sept. 2011(1)  PFSH: Comments: history of colon cancer, doubt, diabetes, tuberculosis  Comments: has been a chronic smoker for more than 25 years pack a day history does not drink.  Works in the post [employer]  Additional Past Medical and Surgical History: diabetes, type II,.  Hypertensin, history of stroke, chronic obstructive pulmonary disease,.   glaucoma.  History of pancreatitis and cholecystitis       ADVANCED DIRECTIVES: Patient does have advanced care directive   HEALTH MAINTENANCE: Social History  Substance Use Topics  . Smoking status: Former Smoker -- 1.00 packs/day for 25 years    Quit date: 01/13/2010  . Smokeless tobacco: None  . Alcohol Use: No       Allergies  Allergen Reactions  . Iodine Shortness Of Breath, Diarrhea and Nausea And Vomiting  . Nexium [Esomeprazole Magnesium] Other (See Comments)    headache    Current Outpatient Prescriptions  Medication Sig Dispense Refill  . albuterol (PROVENTIL HFA;VENTOLIN HFA) 108 (90 BASE) MCG/ACT inhaler Inhale 2 puffs into the lungs every 6 (six) hours as needed for wheezing or shortness of breath.    Marland Kitchen albuterol (PROVENTIL) (2.5 MG/3ML) 0.083% nebulizer solution USE 1 VIAL IN NEBULIZER EVERY 3-4 HOURS AS NEEDED 75 mL 3  . budesonide-formoterol (SYMBICORT) 160-4.5 MCG/ACT inhaler Inhale 2 puffs into the lungs 2 (two) times daily.    . diphenhydrAMINE (BENADRYL) 12.5 MG chewable tablet Chew 25 mg by mouth daily as needed for allergies.    Marland Kitchen glipiZIDE (GLUCOTROL) 10 MG tablet TAKE 1 TABLET BY MOUTH DAILY 30 tablet 6  . guaiFENesin (MUCINEX) 600 MG 12 hr tablet Take 1 tablet (600 mg total) by mouth 2 (two) times daily. 60 tablet 0  . HYDROcodone-acetaminophen (NORCO/VICODIN) 5-325 MG tablet Take 1-2 tablets by mouth every 6 (six) hours as needed for moderate pain. 90 tablet 0  . latanoprost (XALATAN) 0.005 % ophthalmic solution Place 1 drop into both eyes every other day.     . levofloxacin (LEVAQUIN) 500 MG tablet Take 1 tablet (500 mg total) by mouth daily. 4 tablet 0  . loperamide (IMODIUM A-D) 2 MG tablet Take 2 mg by mouth 4 (four) times daily as needed for diarrhea or loose stools.    Marland Kitchen losartan-hydrochlorothiazide (HYZAAR) 50-12.5 MG per tablet Take 1 tablet by mouth daily.    . pioglitazone (ACTOS) 30 MG tablet Take 30 mg by mouth daily.    . predniSONE  (DELTASONE) 10 MG tablet Take 1 tablet (10 mg total) by mouth daily with breakfast. 60 mg PO (ORAL)  x 2 days 50 mg PO x 2 days 40 mg PO x 2 days 30 mg PO x 2 days 20 mg PO x 2 days 10 mg PO x 2 days then stop 20 tablet 0  . temazepam (RESTORIL) 7.5 MG capsule Take 1-2 capsules by mouth at bedtime as needed for sleep.   2  . theophylline (UNIPHYL) 400 MG 24 hr tablet Take 400 mg by mouth daily as needed. For shortness of breath.    . tiotropium (SPIRIVA) 18 MCG inhalation capsule Place 18 mcg into inhaler and inhale daily.     No current facility-administered medications for this visit.   Facility-Administered Medications Ordered in Other Visits  Medication Dose Route Frequency Provider Last Rate Last Dose  .  sodium chloride 0.9 % injection 10 mL  10 mL Intracatheter PRN Forest Gleason, MD   10 mL at 04/28/15 1410  . sodium chloride 0.9 % injection 10 mL  10 mL Intravenous PRN Forest Gleason, MD   10 mL at 05/12/15 1350    OBJECTIVE:  There were no vitals filed for this visit.   There is no weight on file to calculate BMI.    ECOG FS:1 - Symptomatic but completely ambulatory  PHYSICAL EXAM:  Gen. status: Patient is alert oriented not any acute distress using oxygen. HEENT: No evidence of stomatitis. Lymphatic system: No palpable supraclavicular cervical axillary lymphadenopathy Lungs: Diminished at [facility] both sides O rhonchi coarse crepitation Bilateral rhonchi Cardiac: Soft systolic murmur.  Tachycardia Abdominal exam revealed normal bowel sounds. The abdomen was soft, non-tender, and without masses, organomegaly, or appreciable enlargement of the abdominal aorta. Skin: No ecchymosis no rash Neurological system no localizing sign All other systems has been examined no abnormality detected  LAB RESULTS:  Appointment on 12/07/2015  Component Date Value Ref Range Status  . WBC 12/07/2015 8.3  3.8 - 10.6 K/uL Final  . RBC 12/07/2015 4.02* 4.40 - 5.90 MIL/uL Final  . Hemoglobin  12/07/2015 11.1* 13.0 - 18.0 g/dL Final  . HCT 12/07/2015 34.7* 40.0 - 52.0 % Final  . MCV 12/07/2015 86.3  80.0 - 100.0 fL Final  . MCH 12/07/2015 27.5  26.0 - 34.0 pg Final  . MCHC 12/07/2015 31.9* 32.0 - 36.0 g/dL Final  . RDW 12/07/2015 16.0* 11.5 - 14.5 % Final  . Platelets 12/07/2015 148* 150 - 440 K/uL Final  . Neutrophils Relative % 12/07/2015 53   Final  . Neutro Abs 12/07/2015 4.4  1.4 - 6.5 K/uL Final  . Lymphocytes Relative 12/07/2015 37   Final  . Lymphs Abs 12/07/2015 3.1  1.0 - 3.6 K/uL Final  . Monocytes Relative 12/07/2015 4   Final  . Monocytes Absolute 12/07/2015 0.4  0.2 - 1.0 K/uL Final  . Eosinophils Relative 12/07/2015 5   Final  . Eosinophils Absolute 12/07/2015 0.4  0 - 0.7 K/uL Final  . Basophils Relative 12/07/2015 1   Final  . Basophils Absolute 12/07/2015 0.0  0 - 0.1 K/uL Final      STUDIES: Dg Chest 2 View  11/19/2015  CLINICAL DATA:  Short of breath.  History of lung cancer. EXAM: CHEST  2 VIEW COMPARISON:  10/26/2015 FINDINGS: COPD with pulmonary hyperinflation. Negative for pneumonia. Negative for heart failure or effusion History of right upper lobe lung cancer. Linear scarring is present in the area related to prior cancer treatment. This is unchanged. No recurrent mass identified in this area. No interval change. Port-A-Cath tip in the SVC is unchanged. Chronic bilateral rib fractures stable. Mild fracture approximately T12 stable. IMPRESSION: COPD.  No acute abnormality and no interval change Electronically Signed   By: Franchot Gallo M.D.   On: 11/19/2015 16:31   Nm Pet Image Restag (ps) Skull Base To Thigh  11/30/2015  CLINICAL DATA:  Subsequent treatment strategy for lung cancer. EXAM: NUCLEAR MEDICINE PET SKULL BASE TO THIGH TECHNIQUE: 12.4 mCi F-18 FDG was injected intravenously. Full-ring PET imaging was performed from the skull base to thigh after the radiotracer. CT data was obtained and used for attenuation correction and anatomic localization.  FASTING BLOOD GLUCOSE:  Value: 101 mg/dl COMPARISON:  Multiple exams, including 05/05/2015 FINDINGS: NECK No hypermetabolic lymph nodes in the neck. Old left medial orbital wall fracture. Slightly asymmetric activity along the  glottis, right greater than left, likely physiologic/incidental. CHEST Scarring and volume loss in the right posterior medial lung, maximum standard uptake value 2.4, previously 2.8. Lingular volume loss, no hypermetabolic activity. Coronary, aortic arch, and branch vessel atherosclerotic vascular disease. Esophageal diverticulum along the right side of the esophagus in the upper thorax. ABDOMEN/PELVIS Hypermetabolic right renal mass, maximum standard uptake value 11.4 cm, previously 10.8 cm. The associated region of hypermetabolic activity measures approximately 4.3 by 3.3 cm, subjectively larger than on the prior exam. Once again there is apparent retroperitoneal adenopathy with periaortic lymph nodes enlarged, including a 1.6 cm left periaortic node on image 179 of series 3 (previously 1.2 cm), but without hypermetabolic nodal activity. Aortoiliac atherosclerotic vascular disease. Motion artifact causes some ill definition of the porta hepatis. 7 mm hypodense lesion in the left hepatic lobe on image 155 series 3, stable, no definite associated hypermetabolic activity. Suspected small contracted gallbladder. SKELETON No focal hypermetabolic activity to suggest skeletal metastasis. Remote prior mass in the seventh intercostal space on the right is no longer present and there is no hypermetabolic activity in this vicinity. Old bilateral rib fractures are noted. IMPRESSION: 1. Hypermetabolic right renal mass, likely either a metastatic lesion or a renal cell carcinoma. This measures approximately 4.3 by 3.3 cm, subjectively slightly larger than on the prior exam. 2. There is considerable atelectasis/scarring in the right upper lobe, with maximum standard uptake value 2.4, previously 2.8. Much  of this may represent response to therapy, query prior radiation therapy. 3. Persistent and slightly worsened retroperitoneal adenopathy, with various enlarged periaortic lymph nodes, but none of these are currently hypermetabolic. This is similar to the prior exam, and the significance of these nodes is uncertain. 4. Coronary, aortic arch, and branch vessel atherosclerotic vascular disease. Aortoiliac atherosclerotic vascular disease. 5. Stable small hypodense lesion in the left hepatic lobe is technically nonspecific. This was present on the MRI from 07/24/2014 and thought to likely represent a cyst. Electronically Signed   By: Van Clines M.D.   On: 11/30/2015 10:38    ASSESSMENT: 1.  Carcinoma of lung recurrent disease stable disease at present time 2.  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  COPD    Carcinoma of kidney No evidence of progressive disease Retroperitoneal lymphadenopathy low-grade lymphoma T Chest x-ray: Shows a stable disease.  No acute cardiopulmonary abnormality  COPD MEDICAL DECISION MAKING:  All lab data has been reviewed that scan has been reviewed independently  At this point in time only hypermetabolic lesion is right kidney mass Possibility of radiofrequency ablation versus nephrectomy.  I doubt patient can tolerate nephrectomy but will get an opinion from urologist and reevaluate patient   discussed that with the patient and is in agreement with it. Total duration of visit was 66mnutes.  50% or more time was spent in counseling patient and family regarding prognosis and options of treatment and available resources Coordinating care with interventional radiologist , as well as with urologist.   Reviewing all the records from last hospitalization  Blood sugar is stable. Continue low-dose of prednisone for management of COPD and  shortness of breath  Patient has a chronic pain and will continue hydrocodone  Lung cancer, upper lobe   Staging form: Lung, AJCC 7th Edition     Clinical stage from 06/15/2010: yT2, N2, M0 - Signed by LEvlyn Kanner NP on 02/03/2015     Pathologic stage from 04/04/2014:  yT2, N2, M1 - Signed by [name], NP on 02/03/2015 Lymphoma, small lymphocytic   Staging form: Lymphoid Neoplasms, AJCC 6th Edition     Clinical stage from 10/15/2013: Stage II - Signed by [name], NP on 02/03/2015 Renal cell cancer   Staging form: Kidney, AJCC 7th Edition     Clinical stage from 02/03/2015: Stage I (yT1a, N0, M0) - Signed by [name], NP on 02/03/2015   Forest Gleason, MD   12/07/2015 11:36 AM

## 2015-12-09 ENCOUNTER — Other Ambulatory Visit: Payer: Self-pay | Admitting: Oncology

## 2015-12-09 DIAGNOSIS — N2889 Other specified disorders of kidney and ureter: Secondary | ICD-10-CM

## 2015-12-21 ENCOUNTER — Inpatient Hospital Stay: Payer: Federal, State, Local not specified - PPO | Attending: Oncology

## 2015-12-21 ENCOUNTER — Encounter: Payer: Self-pay | Admitting: Urology

## 2015-12-21 ENCOUNTER — Ambulatory Visit (INDEPENDENT_AMBULATORY_CARE_PROVIDER_SITE_OTHER): Payer: Federal, State, Local not specified - PPO | Admitting: Urology

## 2015-12-21 VITALS — BP 128/76 | HR 113 | Ht 68.0 in | Wt 150.0 lb

## 2015-12-21 DIAGNOSIS — C641 Malignant neoplasm of right kidney, except renal pelvis: Secondary | ICD-10-CM

## 2015-12-21 DIAGNOSIS — C3490 Malignant neoplasm of unspecified part of unspecified bronchus or lung: Secondary | ICD-10-CM | POA: Insufficient documentation

## 2015-12-21 LAB — MICROSCOPIC EXAMINATION: Bacteria, UA: NONE SEEN

## 2015-12-21 LAB — URINALYSIS, COMPLETE
Bilirubin, UA: NEGATIVE
GLUCOSE, UA: NEGATIVE
KETONES UA: NEGATIVE
LEUKOCYTES UA: NEGATIVE
NITRITE UA: NEGATIVE
Protein, UA: NEGATIVE
RBC, UA: NEGATIVE
SPEC GRAV UA: 1.015 (ref 1.005–1.030)
Urobilinogen, Ur: 0.2 mg/dL (ref 0.2–1.0)
pH, UA: 5.5 (ref 5.0–7.5)

## 2015-12-21 NOTE — Progress Notes (Signed)
12/21/2015 6:17 PM   Craig [name]. [birth year] [id number]  PCP provider: [name], MD [address] [place], [place] [address]   Consulting Provider: Dr. Oliva Bustard, cancer center  Chief Complaint  Patient presents with  . 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     Most recent CT/ PET in 11/30/15 shows stable disease without progression and no particularly active metabolic diease foci other than his right renal mass.     He presents to the office today for evaluation of possible further management of this renal mass.  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 At that point in time, the mass measured 3.2 cm which was increased from 2 cm in 2011 .  Most recent imaging shows mas measuring 4.3 cm by 3.3 cm.  He denies flank pain, gross hematuria, or dysuria.  No urinary issues.    He is oxygen dependent and has had several episodes of respiratory failure.    Cr 1.17.   PMH: Past Medical History  Diagnosis Date  . Lung cancer ([facility])     right upper lobe mass positive for adenocarcinoma  . Cancer ([facility])   . Renal cancer ([facility])   . Lymphoma ([facility])     low grade  . COPD (chronic obstructive pulmonary disease) ([facility])   . Stroke ([facility])   . Hypertension   . Diabetes mellitus without complication ([facility])   . Respiratory failure ([facility]) 07/12/2010    acute  . Colon cancer ([facility])   . Tuberculosis   . Gout   . Glaucoma   . Pancreatitis   . Cholecystitis     Surgical History: History reviewed. No pertinent past surgical history.  Home Medications:    Medication List       This list is accurate as of: 12/21/15  6:17 PM.  Always use your most recent med list.                 albuterol 108 (90 Base) MCG/ACT inhaler  Commonly known as:  PROVENTIL HFA;VENTOLIN HFA  Inhale 2 puffs into the lungs every 6 (six) hours as needed for wheezing or shortness of breath. Reported on 12/21/2015     albuterol (2.5 MG/3ML) 0.083% nebulizer solution  Commonly known as:  PROVENTIL  USE 1 VIAL IN NEBULIZER EVERY 3-4 HOURS AS NEEDED     budesonide-formoterol 160-4.5 MCG/ACT inhaler  Commonly known as:  SYMBICORT  Inhale 2 puffs into the lungs 2 (two) times daily. Reported on 12/21/2015     diphenhydrAMINE 12.5 MG chewable tablet  Commonly known as:  BENADRYL  Chew 25 mg by mouth daily as needed for allergies. Reported on 12/21/2015     glipiZIDE 10 MG tablet  Commonly known as:  GLUCOTROL  TAKE 1 TABLET BY MOUTH DAILY     HYDROcodone-acetaminophen 5-325 MG tablet  Commonly known as:  NORCO/VICODIN  Take 1-2 tablets by mouth every 6 (six) hours as needed for moderate pain.     latanoprost 0.005 % ophthalmic solution  Commonly known as:  XALATAN  Place 1 drop into both eyes every other day.     loperamide 2 MG tablet  Commonly known as:  IMODIUM A-D  Take 2 mg by mouth 4 (four) times daily as needed for diarrhea or loose stools. Reported on 12/21/2015     losartan-hydrochlorothiazide 50-12.5 MG tablet  Commonly known as:  HYZAAR  Take 1 tablet by mouth daily.     pioglitazone 30 MG tablet  Commonly known as:  ACTOS  Take 30 mg by mouth daily.     temazepam 7.5 MG capsule  Commonly known as:  RESTORIL  Take 1-2 capsules by mouth at bedtime as needed for sleep. Reported on 12/21/2015     theophylline 400 MG 24 hr tablet  Commonly known as:  UNIPHYL  Take 400 mg by mouth daily as needed. Reported on 12/21/2015     tiotropium 18 MCG inhalation capsule  Commonly known as:  SPIRIVA  Place 18 mcg into inhaler and inhale daily. Reported on 12/21/2015        Allergies:  Allergies  Allergen Reactions  . Iodine Shortness Of Breath, Diarrhea and Nausea And Vomiting  .  Nexium [Esomeprazole Magnesium] Other (See Comments)    headache    Family History: Family History  Problem Relation Age of Onset  . Diabetes Mellitus II Mother   . Heart disease Father   . Hypertension Father   . Diabetes Mellitus II Sister   . Prostate cancer Brother     Social History:  reports that he quit smoking about 5 years ago. He does not have any smokeless tobacco history on file. He reports that he does not drink alcohol. His drug history is not on file.  ROS: UROLOGY Frequent Urination?: Yes Hard to postpone urination?: No Burning/pain with urination?: No Get up at night to urinate?: Yes Leakage of urine?: No Urine stream starts and stops?: No Trouble starting stream?: No Do you have to strain to urinate?: No Blood in urine?: No Urinary tract infection?: No Sexually transmitted disease?: No Injury to kidneys or bladder?: No Painful intercourse?: No Weak stream?: No Erection problems?: No Penile pain?: No  Gastrointestinal Nausea?: No Vomiting?: No Indigestion/heartburn?: No Diarrhea?: Yes Constipation?: No  Constitutional Fever: No Night sweats?: No Weight loss?: No Fatigue?: Yes  Skin Skin rash/lesions?: No Itching?: No  Eyes Blurred vision?: No Double vision?: No  Ears/Nose/Throat Sore throat?: No Sinus problems?: No  Hematologic/Lymphatic Swollen glands?: No Easy bruising?: No  Cardiovascular Leg swelling?: No Chest pain?: No  Respiratory Cough?: No Shortness of breath?: No  Endocrine Excessive thirst?: No  Musculoskeletal Back pain?: No Joint pain?: No  Neurological Headaches?: No Dizziness?: No  Psychologic Depression?: No Anxiety?: No  Physical Exam: BP 128/76 mmHg  Pulse 113  Ht '5\' 8"'$  (1.727 m)  Wt 150 lb (68.04 kg)  BMI 22.81 kg/m2  Constitutional:  Alert and oriented, No acute distress.  Ill appearing. HEENT: [place] AT, moist mucus membranes.  Trachea midline, no masses. Cardiovascular: No clubbing,  cyanosis, or edema. Respiratory: Normal respiratory effort, no increased work of breathing.  Wearing 02.   GI: Abdomen is soft, nontender, nondistended, no abdominal masses GU: No CVA tenderness.  Skin: No rashes, bruises or suspicious lesions. Lymph: No cervical or inguinal adenopathy. Neurologic: Grossly intact, no focal deficits, moving all 4 extremities. Psychiatric: Normal mood and affect.  Laboratory Data: Lab Results  Component Value Date   WBC 8.3 12/07/2015   HGB 11.1* 12/07/2015   HCT 34.7* 12/07/2015   MCV 86.3 12/07/2015   PLT 148* 12/07/2015    Lab Results  Component Value Date   CREATININE 1.17 12/07/2015    Lab Results  Component Value Date   HGBA1C 9.8* 08/27/2014    Urinalysis Results for orders placed or performed in visit on 12/21/15  Microscopic Examination  Result Value Ref Range   WBC, UA 0-5 0 -  5 /hpf   RBC, UA 0-2 0 -  2 /hpf   Epithelial Cells (non renal) 0-10 0 - 10 /hpf   Casts Present (A) None seen /lpf   Cast Type Hyaline casts N/A   Bacteria, UA None seen None seen/Few  Urinalysis, Complete  Result Value Ref Range   Specific Gravity, UA 1.015 1.005 - 1.030   pH, UA 5.5 5.0 - 7.5   Color, UA Yellow Yellow   Appearance Ur Clear Clear   Leukocytes, UA Negative Negative   Protein, UA Negative Negative/Trace   Glucose, UA Negative Negative   Ketones, UA Negative Negative   RBC, UA Negative Negative   Bilirubin, UA Negative Negative   Urobilinogen, Ur 0.2 0.2 - 1.0 mg/dL   Nitrite, UA Negative Negative   Microscopic Examination See below:     Pertinent Imaging: Study Result     CLINICAL DATA: Subsequent treatment strategy for lung cancer.  EXAM: NUCLEAR MEDICINE PET SKULL BASE TO THIGH  TECHNIQUE: 12.4 mCi F-18 FDG was injected intravenously. Full-ring PET imaging was performed from the skull base to thigh after the radiotracer. CT data was obtained and used for attenuation correction and  anatomic localization.  FASTING BLOOD GLUCOSE: Value: 101 mg/dl  COMPARISON: Multiple exams, including 05/05/2015  FINDINGS: NECK  No hypermetabolic lymph nodes in the neck. Old left medial orbital wall fracture. Slightly asymmetric activity along the glottis, right greater than left, likely physiologic/incidental.  CHEST  Scarring and volume loss in the right posterior medial lung, maximum standard uptake value 2.4, previously 2.8. Lingular volume loss, no hypermetabolic activity. Coronary, aortic arch, and branch vessel atherosclerotic vascular disease. Esophageal diverticulum along the right side of the esophagus in the upper thorax.  ABDOMEN/PELVIS  Hypermetabolic right renal mass, maximum standard uptake value 11.4 cm, previously 10.8 cm. The associated region of hypermetabolic activity measures approximately 4.3 by 3.3 cm, subjectively larger than on the prior exam.  Once again there is apparent retroperitoneal adenopathy with periaortic lymph nodes enlarged, including a 1.6 cm left periaortic node on image 179 of series 3 (previously 1.2 cm), but without hypermetabolic nodal activity.  Aortoiliac atherosclerotic vascular disease. Motion artifact causes some ill definition of the porta hepatis. 7 mm hypodense lesion in the left hepatic lobe on image 155 series 3, stable, no definite associated hypermetabolic activity. Suspected small contracted gallbladder.  SKELETON  No focal hypermetabolic activity to suggest skeletal metastasis. Remote prior mass in the seventh intercostal space on the right is no longer present and there is no hypermetabolic activity in this vicinity. Old bilateral rib fractures are noted.  IMPRESSION: 1. Hypermetabolic right renal mass, likely either a metastatic lesion or a renal cell carcinoma. This measures approximately 4.3 by 3.3 cm, subjectively slightly larger than on the prior exam. 2. There is considerable  atelectasis/scarring in the right upper lobe, with maximum standard uptake value 2.4, previously 2.8. Much of this may represent response to therapy, query prior radiation therapy. 3. Persistent and slightly worsened retroperitoneal adenopathy, with various enlarged periaortic lymph nodes, but none of these are currently hypermetabolic. This is similar to the prior exam, and the significance of these nodes is uncertain. 4. Coronary, aortic arch, and branch vessel atherosclerotic vascular disease. Aortoiliac atherosclerotic vascular disease. 5. Stable small hypodense lesion in  the left hepatic lobe is technically nonspecific. This was present on the MRI from 07/24/2014 and thought to likely represent a cyst.   Electronically Signed  By: Van Clines M.D.  On: 11/30/2015 10:38   CT scan reviewed personally today with the patient.  Assessment & Plan:    1. Renal cell cancer, right ([facility]) 4.3 cm endophytic biopsy-proven right renal cell carcinoma. In addition to this malignancy, he has a history of metastatic lung cancer as well as follicular lymphoma involving retroperitoneum.  Renal mass has relatively slow growth rate with no obvious disease outside of the kidney although this is difficult to assess given his history of lymphoma involving the retroperitoneum.  No renal vein involvement.  He is oxygen dependent, other medical comorbidities, and has relatively poor functional status.  As such, do not feel that he is an ideal surgical candidate.  In addition, given the location of the tumor and its very endophytic nature, he is not a good candidate for partial nephrectomy and would likely need to undergo radical right nephrectomy which would likely worsen his renal function (Cr 1.17 currently) and risk for needing further chemotherapy in the future, it may limit his ability to receive additional agents should he develop worsening of his chronic kidney disease.  He does have an  appointment with interventional radiology next week for discussion of possible ablative therapies which is worth considering although may not be technically possible given location of mass.  I encouraged him to keep this follow-up appointment to explore these options further.  Continued surveillance is also very reasonable option given the relatively slow growth rate of this mass in his other medical comorbidities. The lesion is now reached a size greater than 4 cm at which development of metastatic disease is possible, but in the scheme of things is still relatively small and asymptomatic.    All of his questions were answered today.  - Urinalysis, Complete  Follow up as needed.     Hollice Espy, MD  [facility] [address], [place],  [address] [phone]  I spent 40 min with this patient of which greater than 50% was spent in counseling and coordination of care with the patient.

## 2015-12-23 ENCOUNTER — Other Ambulatory Visit: Payer: Self-pay | Admitting: Internal Medicine

## 2015-12-23 ENCOUNTER — Telehealth: Payer: Self-pay | Admitting: *Deleted

## 2015-12-23 MED ORDER — CEPHALEXIN 500 MG PO CAPS: 500.0000 mg | ORAL_CAPSULE | Freq: Three times a day (TID) | ORAL | Status: DC

## 2015-12-23 NOTE — Telephone Encounter (Signed)
Asking for abx for congestion and SOB

## 2015-12-23 NOTE — Telephone Encounter (Signed)
Keflex 500 mg tid times 7 days e scribed per VO Dr Rogue Bussing

## 2015-12-29 ENCOUNTER — Other Ambulatory Visit (HOSPITAL_COMMUNITY): Payer: Self-pay | Admitting: Interventional Radiology

## 2015-12-29 ENCOUNTER — Ambulatory Visit
Admission: RE | Admit: 2015-12-29 | Discharge: 2015-12-29 | Disposition: A | Discharge status: Home / Self Care | Payer: Federal, State, Local not specified - PPO | Source: Ambulatory Visit | Attending: Oncology | Admitting: Oncology

## 2015-12-29 DIAGNOSIS — N2889 Other specified disorders of kidney and ureter: Secondary | ICD-10-CM

## 2015-12-29 HISTORY — DX: Other specified disorders of kidney and ureter: N28.89

## 2015-12-29 NOTE — Consult Note (Signed)
Chief Complaint: Patient was seen in consultation today at the request of [place] for cryoablation of a right renal mass.  Referring Physician(s): Choksi,Janak  History of Present Illness: Craig [name]. is a 65 y.o. 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 He was treated with radiation therapy and chemotherapy at that time.  He underwent multiple rounds of chemotherapy over several years with significant treatment response and minimal residual hypermetabolic disease by recent PET scan in February. He also has a history of CLL diagnosed by EUS guided retroperitoneal lymph node biopsy in January, 2015.   A 2.3 cm endophytic upper to mid right renal mass was detected by CT on 09/07/2013. This showed slow growth over time and was evaluated by MRI in October, 2015, demonstrating a 3 cm solid and enhancing mass suspicious for renal carcinoma. Biopsy of the mass on 08/03/2014 was performed under ultrasound guidance demonstrating evidence of clear cell carcinoma, Fuhrman nuclear grade 3. Over the last year and a half, the right renal mass has demonstrated slightly increased prominence and size by PET with SUV of approximately 7-11.  On the most recent PET scan dated 11/30/2015, the right renal mass was estimated to be approximately 4 cm in diameter.  The patient has had a urologic consultation with Dr. Erlene Quan on 12/21/2015. Due to oxygen dependency from COPD and other medical comorbidities, Dr. Erlene Quan felt that Craig [name] was a relatively poor surgical candidate for nephrectomy.  The patient is on continuous oxygen. He sees Dr. Raul Del for management of COPD. He uses 2 L by nasal cannula at rest and at night. He increase his oxygen level to 3 L by nasal cannula during the day if he is doing any type of activity. He is fully ambulatory. He quit smoking in 2011.   Past Medical History  Diagnosis Date  . Lung cancer ([place])     right upper lobe  mass positive for adenocarcinoma  . Cancer ([place])   . Renal cancer ([place])   . Lymphoma ([facility])     low grade  . COPD (chronic obstructive pulmonary disease) ([place])   . Stroke ([place])   . Hypertension   . Diabetes mellitus without complication ([place])   . Respiratory failure ([place]) 07/12/2010    acute  . Colon cancer ([place])   . Tuberculosis   . Gout   . Glaucoma   . Pancreatitis   . Cholecystitis   . Right renal mass    PCP:  [name], M.D.  No past surgical history on file.  Allergies: Iodine and Nexium  Medications: Prior to Admission medications   Medication Sig Start Date End Date Taking? Authorizing Provider  albuterol (PROVENTIL HFA;VENTOLIN HFA) 108 (90 BASE) MCG/ACT inhaler Inhale 2 puffs into the lungs every 6 (six) hours as needed for wheezing or shortness of breath. Reported on 12/21/2015   Yes Historical Provider, MD  albuterol (PROVENTIL) (2.5 MG/3ML) 0.083% nebulizer solution USE 1 VIAL IN NEBULIZER EVERY 3-4 HOURS AS NEEDED 07/05/15  Yes Forest Gleason, MD  budesonide-formoterol (SYMBICORT) 160-4.5 MCG/ACT inhaler Inhale 2 puffs into the lungs 2 (two) times daily. Reported on 12/21/2015   Yes Historical Provider, MD  cephALEXin (KEFLEX) 500 MG capsule Take 1 capsule (500 mg total) by mouth 3 (three) times daily. 12/23/15  Yes [name], MD  diphenhydrAMINE (BENADRYL) 12.5 MG chewable tablet Chew 25 mg by mouth daily as needed for allergies. Reported on 12/21/2015  Yes Historical Provider, MD  glipiZIDE (GLUCOTROL) 10 MG tablet TAKE 1 TABLET BY MOUTH DAILY 08/16/15  Yes Forest Gleason, MD  HYDROcodone-acetaminophen (NORCO/VICODIN) 5-325 MG tablet Take 1-2 tablets by mouth every 6 (six) hours as needed for moderate pain. 12/07/15  Yes Forest Gleason, MD  latanoprost (XALATAN) 0.005 % ophthalmic solution Place 1 drop into both eyes every other day.  04/19/15  Yes Historical Provider, MD  loperamide (IMODIUM A-D) 2 MG tablet Take 2 mg by mouth 4 (four) times daily as needed for  diarrhea or loose stools. Reported on 12/21/2015   Yes Historical Provider, MD  losartan-hydrochlorothiazide (HYZAAR) 50-12.5 MG per tablet Take 1 tablet by mouth daily.   Yes Historical Provider, MD  pioglitazone (ACTOS) 30 MG tablet Take 30 mg by mouth daily.   Yes Historical Provider, MD  temazepam (RESTORIL) 7.5 MG capsule Take 1-2 capsules by mouth at bedtime as needed for sleep. Reported on 12/21/2015 05/02/15  Yes Historical Provider, MD  theophylline (UNIPHYL) 400 MG 24 hr tablet Take 400 mg by mouth daily as needed. Reported on 12/21/2015 11/11/15  Yes Historical Provider, MD  tiotropium (SPIRIVA) 18 MCG inhalation capsule Place 18 mcg into inhaler and inhale daily. Reported on 12/21/2015   Yes Historical Provider, MD     Family History  Problem Relation Age of Onset  . Diabetes Mellitus II Mother   . Heart disease Father   . Hypertension Father   . Diabetes Mellitus II Sister   . Prostate cancer Brother     Social History   Social History  . Marital Status: Single    Spouse Name: N/A  . Number of Children: N/A  . Years of Education: N/A   Social History Main Topics  . Smoking status: Former Smoker -- 1.00 packs/day for 25 years    Types: Cigarettes    Start date: 12/28/1968    Quit date: 01/13/2010  . Smokeless tobacco: Never Used  . Alcohol Use: No     Comment: Stopped 2001  . Drug Use: No  . Sexual Activity: Not on file   Other Topics Concern  . Not on file   Social History Narrative    ECOG Status: 1 - Symptomatic but completely ambulatory  Review of Systems: A 12 point ROS discussed and pertinent positives are indicated in the HPI above.  All other systems are negative.  Review of Systems  Constitutional: Negative.   HENT: Negative.   Respiratory: Negative for apnea, cough, choking, chest tightness and stridor.        Dyspnea with exertion  Cardiovascular: Negative.   Gastrointestinal: Negative.   Genitourinary: Negative.   Musculoskeletal: Negative.     Neurological: Negative.     Vital Signs: BP 141/74 mmHg  Pulse 117  Temp(Src) 98.1 F (36.7 C) (Oral)  Resp 14  Ht '5\' 9"'$  (1.753 m)  Wt 151 lb (68.493 kg)  BMI 22.29 kg/m2  SpO2 93%  Physical Exam  Constitutional: He is oriented to person, place, and time. He appears well-developed and well-nourished. No distress.  Cardiovascular: Normal rate, regular rhythm and normal heart sounds.  Exam reveals no gallop and no friction rub.   No murmur heard. Pulmonary/Chest: No respiratory distress. He has no wheezes. He has no rales.  Distant breath sounds.  No wheezing or rhonchi.   Abdominal: Soft. Bowel sounds are normal. He exhibits no distension and no mass. There is no tenderness. There is no rebound and no guarding.  Musculoskeletal: He exhibits no edema.  Neurological:  He is alert and oriented to person, place, and time.  Skin: He is not diaphoretic.  Nursing note and vitals reviewed.   Imaging: Nm Pet Image Restag (ps) Skull Base To Thigh  11/30/2015  CLINICAL DATA:  Subsequent treatment strategy for lung cancer. EXAM: NUCLEAR MEDICINE PET SKULL BASE TO THIGH TECHNIQUE: 12.4 mCi F-18 FDG was injected intravenously. Full-ring PET imaging was performed from the skull base to thigh after the radiotracer. CT data was obtained and used for attenuation correction and anatomic localization. FASTING BLOOD GLUCOSE:  Value: 101 mg/dl COMPARISON:  Multiple exams, including 05/05/2015 FINDINGS: NECK No hypermetabolic lymph nodes in the neck. Old left medial orbital wall fracture. Slightly asymmetric activity along the glottis, right greater than left, likely physiologic/incidental. CHEST Scarring and volume loss in the right posterior medial lung, maximum standard uptake value 2.4, previously 2.8. Lingular volume loss, no hypermetabolic activity. Coronary, aortic arch, and branch vessel atherosclerotic vascular disease. Esophageal diverticulum along the right side of the esophagus in the upper  thorax. ABDOMEN/PELVIS Hypermetabolic right renal mass, maximum standard uptake value 11.4 cm, previously 10.8 cm. The associated region of hypermetabolic activity measures approximately 4.3 by 3.3 cm, subjectively larger than on the prior exam. Once again there is apparent retroperitoneal adenopathy with periaortic lymph nodes enlarged, including a 1.6 cm left periaortic node on image 179 of series 3 (previously 1.2 cm), but without hypermetabolic nodal activity. Aortoiliac atherosclerotic vascular disease. Motion artifact causes some ill definition of the porta hepatis. 7 mm hypodense lesion in the left hepatic lobe on image 155 series 3, stable, no definite associated hypermetabolic activity. Suspected small contracted gallbladder. SKELETON No focal hypermetabolic activity to suggest skeletal metastasis. Remote prior mass in the seventh intercostal space on the right is no longer present and there is no hypermetabolic activity in this vicinity. Old bilateral rib fractures are noted. IMPRESSION: 1. Hypermetabolic right renal mass, likely either a metastatic lesion or a renal cell carcinoma. This measures approximately 4.3 by 3.3 cm, subjectively slightly larger than on the prior exam. 2. There is considerable atelectasis/scarring in the right upper lobe, with maximum standard uptake value 2.4, previously 2.8. Much of this may represent response to therapy, query prior radiation therapy. 3. Persistent and slightly worsened retroperitoneal adenopathy, with various enlarged periaortic lymph nodes, but none of these are currently hypermetabolic. This is similar to the prior exam, and the significance of these nodes is uncertain. 4. Coronary, aortic arch, and branch vessel atherosclerotic vascular disease. Aortoiliac atherosclerotic vascular disease. 5. Stable small hypodense lesion in the left hepatic lobe is technically nonspecific. This was present on the MRI from 07/24/2014 and thought to likely represent a cyst.  Electronically Signed   By: Van Clines M.D.   On: 11/30/2015 10:38    Labs:  CBC:  Recent Labs  10/26/15 1003 11/19/15 1600 11/20/15 0649 12/07/15 1112  WBC 10.3 11.9* 9.0 8.3  HGB 10.8* 10.4* 9.4* 11.1*  HCT 33.8* 32.8* 28.9* 34.7*  PLT 192 321 277 148*    COAGS: No results for input(s): INR, APTT in the last 8760 hours.  BMP:  Recent Labs  10/26/15 1003 11/19/15 1600 11/20/15 0649 12/07/15 1112  NA 137 137 138 137  K 3.8 5.3* 4.9 4.5  CL 98* 95* 100* 95*  CO2 34* 32 28 37*  GLUCOSE 98 160* 183* 189*  BUN 22* 17 23* 22*  CALCIUM 9.1 9.5 8.5* 8.9  CREATININE 1.20 1.21 1.30* 1.17  GFRNONAA >60 >60 56* >60  GFRAA >60 >60 >  60 >60    LIVER FUNCTION TESTS:  Recent Labs  10/26/15 1003 11/19/15 1600 11/20/15 0649 12/07/15 1112  BILITOT 0.4 0.3 0.4 0.4  AST '16 17 16 '$ 13*  ALT 12* 14* 13* 15*  ALKPHOS 74 81 64 58  PROT 7.6 8.0 7.1 6.7  ALBUMIN 4.4 3.9 3.4* 3.7    Assessment and Plan:  I met with Craig [name] and reviewed imaging with him. Even though the now 4 cm clear cell carcinoma is in an endophytic location within the anterolateral aspect of the upper to mid right kidney, it still is approachable for percutaneous ablation utilizing cryoablation. The tumor comes close to the collecting system along its margins but is not near the renal pelvis or other aspects of the central collecting system. I told Craig [name] that I believe this could be successfully treated with percutaneous cryoablation. Indications for ablation include poor candidacy for nephrectomy and renal sparing due to potential need for additional future chemotherapy. The mass has demonstrated fairly slow growth over time, but was a nuclear grade 3 by pathology and consistent with a higher grade carcinoma. There would be increasing metastatic potential as the lesion grows and once greater than 5 cm, likely could not be successfully ablated.  His oxygen dependence and moderately advanced COPD  does place him at risk for respiratory complications following general anesthesia. In order to provide accurate ablation, I told the patient that I do prefer to use general anesthesia for ablation procedures. I would like to obtain Dr. Gust Brooms opinion about whether the pulmonary risk is acceptable enough for a fairly short time under general anesthesia (1-2 hours). The patient has an appointment to see Dr. Raul Del next Friday, 01/06/16.  I also recommended that we obtain a follow-up MRI to compare to the MRI performed on 07/24/2014 to fully characterize the dimensions of the mass, enhancement pattern and evaluate for any evidence of regional lymphadenopathy or renal vein involvement. His will be scheduled on an outpatient basis at [facility].  Thank you for this interesting consult.  I greatly enjoyed meeting Craig [name]. and look forward to participating in their care.  A copy of this report was sent to the requesting provider on this date.  Electronically SignedAletta Edouard T 12/29/2015, 12:33 PM   I spent a total of 40 Minutes in face to face in clinical consultation, greater than 50% of which was counseling/coordinating care for treatment of a right clear cell renal carcinoma.

## 2016-01-04 ENCOUNTER — Telehealth: Payer: Self-pay | Admitting: *Deleted

## 2016-01-04 DIAGNOSIS — C641 Malignant neoplasm of right kidney, except renal pelvis: Secondary | ICD-10-CM

## 2016-01-04 DIAGNOSIS — C3492 Malignant neoplasm of unspecified part of left bronchus or lung: Secondary | ICD-10-CM

## 2016-01-04 MED ORDER — HYDROCODONE-ACETAMINOPHEN 5-325 MG PO TABS: 1.0000 | ORAL_TABLET | Freq: Four times a day (QID) | ORAL | Status: DC | PRN

## 2016-01-04 NOTE — Telephone Encounter (Signed)
Patient requesting refill for Hydrocodone.  Would like to pick it up tomorrow morning.

## 2016-01-04 NOTE — Telephone Encounter (Signed)
Refill ready for pick up

## 2016-01-12 ENCOUNTER — Telehealth: Payer: Self-pay | Admitting: *Deleted

## 2016-01-12 MED ORDER — LORAZEPAM 1 MG PO TABS: ORAL_TABLET | ORAL | Status: DC

## 2016-01-12 NOTE — Telephone Encounter (Signed)
Ativan 1 mg once 1 hour prior to scan

## 2016-01-12 NOTE — Telephone Encounter (Signed)
Having an MRI Tuesday and states his claustrophobic and will need med to be able to do the scan

## 2016-01-13 ENCOUNTER — Other Ambulatory Visit: Payer: Self-pay | Admitting: Interventional Radiology

## 2016-01-13 DIAGNOSIS — N2889 Other specified disorders of kidney and ureter: Secondary | ICD-10-CM

## 2016-01-17 ENCOUNTER — Ambulatory Visit
Admission: RE | Admit: 2016-01-17 | Discharge: 2016-01-17 | Disposition: A | Discharge status: Home / Self Care | Payer: Federal, State, Local not specified - PPO | Source: Ambulatory Visit | Attending: Interventional Radiology | Admitting: Interventional Radiology

## 2016-01-17 DIAGNOSIS — N2889 Other specified disorders of kidney and ureter: Secondary | ICD-10-CM | POA: Insufficient documentation

## 2016-01-17 DIAGNOSIS — R590 Localized enlarged lymph nodes: Secondary | ICD-10-CM | POA: Insufficient documentation

## 2016-01-17 LAB — POCT I-STAT CREATININE: Creatinine, Ser: 1.1 mg/dL (ref 0.61–1.24)

## 2016-01-17 MED ORDER — GADOBENATE DIMEGLUMINE 529 MG/ML IV SOLN
15.0000 mL | Freq: Once | INTRAVENOUS | Status: AC | PRN
  Administered 2016-01-17: 15 mL via INTRAVENOUS

## 2016-01-19 ENCOUNTER — Telehealth: Payer: Self-pay | Admitting: *Deleted

## 2016-01-19 NOTE — Telephone Encounter (Signed)
Advised pt to contact PCP

## 2016-01-19 NOTE — Telephone Encounter (Signed)
-----   Message from Cephus Richer sent at 01/19/2016 11:29 AM EDT ----- Per answering svc pt left message need appt asap ---- ill- congestion?

## 2016-01-30 ENCOUNTER — Other Ambulatory Visit: Payer: Self-pay | Admitting: *Deleted

## 2016-01-30 DIAGNOSIS — C3492 Malignant neoplasm of unspecified part of left bronchus or lung: Secondary | ICD-10-CM

## 2016-01-30 DIAGNOSIS — C641 Malignant neoplasm of right kidney, except renal pelvis: Secondary | ICD-10-CM

## 2016-01-30 MED ORDER — HYDROCODONE-ACETAMINOPHEN 5-325 MG PO TABS: 1.0000 | ORAL_TABLET | Freq: Four times a day (QID) | ORAL | Status: DC | PRN

## 2016-01-31 ENCOUNTER — Other Ambulatory Visit: Payer: Self-pay | Admitting: General Surgery

## 2016-02-02 NOTE — Patient Instructions (Addendum)
YOUR PROCEDURE IS SCHEDULED ON :  02/10/16  REPORT TO [facility] MAIN ENTRANCE FOLLOW SIGNS TO EAST ELEVATOR - GO TO 3rd FLOOR CHECK IN AT 3 EAST NURSES STATION (SHORT STAY) AT: 10:00 AM  CALL THIS NUMBER IF YOU HAVE PROBLEMS THE MORNING OF SURGERY [phone]  REMEMBER:ONLY 1 PER PERSON MAY GO TO SHORT STAY WITH YOU TO GET READY THE MORNING OF YOUR SURGERY  DO NOT EAT FOOD OR DRINK LIQUIDS AFTER MIDNIGHT  TAKE THESE MEDICINES THE MORNING OF SURGERY:  SYMBICORT / SPIRIVA / PREDNISONE / EYE DROPS / HYDROCODONE  YOU MAY NOT HAVE ANY METAL ON YOUR BODY INCLUDING HAIR PINS AND PIERCING'S. DO NOT WEAR JEWELRY, MAKEUP, LOTIONS, POWDERS OR PERFUMES. DO NOT WEAR NAIL POLISH. DO NOT SHAVE 48 HRS PRIOR TO SURGERY. MEN MAY SHAVE FACE AND NECK.  DO NOT [place]. [facility] IS NOT RESPONSIBLE FOR VALUABLES.  CONTACTS, DENTURES OR PARTIALS MAY NOT BE WORN TO SURGERY. LEAVE SUITCASE IN CAR. CAN BE BROUGHT TO ROOM AFTER SURGERY.  PATIENTS DISCHARGED THE DAY OF SURGERY WILL NOT BE ALLOWED TO DRIVE HOME.  PLEASE READ OVER THE FOLLOWING INSTRUCTION SHEETS _________________________________________________________________________________                                          [facility] - PREPARING FOR SURGERY  Before surgery, you can play an important role.  Because skin is not sterile, your skin needs to be as free of germs as possible.  You can reduce the number of germs on your skin by washing with CHG (chlorahexidine gluconate) soap before surgery.  CHG is an antiseptic cleaner which kills germs and bonds with the skin to continue killing germs even after washing. Please DO NOT use if you have an allergy to CHG or antibacterial soaps.  If your skin becomes reddened/irritated stop using the CHG and inform your nurse when you arrive at Short Stay. Do not shave (including legs and underarms) for at least 48 hours prior to the first CHG shower.  You may shave  your face. Please follow these instructions carefully:   1.  Shower with CHG Soap the night before surgery and the  morning of Surgery.   2.  If you choose to wash your hair, wash your hair first as usual with your  normal  Shampoo.   3.  After you shampoo, rinse your hair and body thoroughly to remove the  shampoo.                                         4.  Use CHG as you would any other liquid soap.  You can apply chg directly  to the skin and wash . Gently wash with scrungie or clean wascloth    5.  Apply the CHG Soap to your body ONLY FROM THE NECK DOWN.   Do not use on open                           Wound or open sores. Avoid contact with eyes, ears mouth and genitals (private parts).  Genitals (private parts) with your normal soap.              6.  Wash thoroughly, paying special attention to the area where your surgery  will be performed.   7.  Thoroughly rinse your body with warm water from the neck down.   8.  DO NOT shower/wash with your normal soap after using and rinsing off  the CHG Soap .                9.  Pat yourself dry with a clean towel.             10.  Wear clean night clothes to bed after shower             11.  Place clean sheets on your bed the night of your first shower and do not  sleep with pets.  Day of Surgery : Do not apply any lotions/deodorants the morning of surgery.  Please wear clean clothes to the hospital/surgery center.  FAILURE TO FOLLOW THESE INSTRUCTIONS MAY RESULT IN THE CANCELLATION OF YOUR SURGERY    PATIENT SIGNATURE_________________________________  ______________________________________________________________________

## 2016-02-03 ENCOUNTER — Encounter (HOSPITAL_COMMUNITY): Payer: Self-pay

## 2016-02-03 ENCOUNTER — Encounter (HOSPITAL_COMMUNITY)
Admission: RE | Admit: 2016-02-03 | Discharge: 2016-02-03 | Disposition: A | Discharge status: Home / Self Care | Payer: Federal, State, Local not specified - PPO | Source: Ambulatory Visit | Attending: Interventional Radiology | Admitting: Interventional Radiology

## 2016-02-03 DIAGNOSIS — J45909 Unspecified asthma, uncomplicated: Secondary | ICD-10-CM | POA: Diagnosis not present

## 2016-02-03 DIAGNOSIS — K219 Gastro-esophageal reflux disease without esophagitis: Secondary | ICD-10-CM | POA: Diagnosis not present

## 2016-02-03 DIAGNOSIS — C649 Malignant neoplasm of unspecified kidney, except renal pelvis: Secondary | ICD-10-CM | POA: Insufficient documentation

## 2016-02-03 DIAGNOSIS — C3492 Malignant neoplasm of unspecified part of left bronchus or lung: Secondary | ICD-10-CM | POA: Insufficient documentation

## 2016-02-03 DIAGNOSIS — I10 Essential (primary) hypertension: Secondary | ICD-10-CM | POA: Insufficient documentation

## 2016-02-03 DIAGNOSIS — Z01812 Encounter for preprocedural laboratory examination: Secondary | ICD-10-CM | POA: Insufficient documentation

## 2016-02-03 DIAGNOSIS — J449 Chronic obstructive pulmonary disease, unspecified: Secondary | ICD-10-CM | POA: Diagnosis not present

## 2016-02-03 DIAGNOSIS — E119 Type 2 diabetes mellitus without complications: Secondary | ICD-10-CM | POA: Insufficient documentation

## 2016-02-03 HISTORY — DX: Reserved for inherently not codable concepts without codable children: IMO0001

## 2016-02-03 HISTORY — DX: Gastro-esophageal reflux disease without esophagitis: K21.9

## 2016-02-03 HISTORY — DX: Unspecified asthma, uncomplicated: J45.909

## 2016-02-03 HISTORY — DX: Dependence on supplemental oxygen: Z99.81

## 2016-02-03 HISTORY — DX: Sleep disorder, unspecified: G47.9

## 2016-02-03 LAB — BASIC METABOLIC PANEL
ANION GAP: 3 — AB (ref 5–15)
BUN: 24 mg/dL — ABNORMAL HIGH (ref 6–20)
CHLORIDE: 107 mmol/L (ref 101–111)
CO2: 35 mmol/L — AB (ref 22–32)
Calcium: 9.2 mg/dL (ref 8.9–10.3)
Creatinine, Ser: 1.41 mg/dL — ABNORMAL HIGH (ref 0.61–1.24)
GFR calc non Af Amer: 51 mL/min — ABNORMAL LOW (ref >60)
GFR, EST AFRICAN AMERICAN: 59 mL/min — AB (ref >60)
Glucose, Bld: 155 mg/dL — ABNORMAL HIGH (ref 65–99)
Potassium: 4.7 mmol/L (ref 3.5–5.1)
Sodium: 145 mmol/L (ref 135–145)

## 2016-02-03 LAB — CBC
HEMATOCRIT: 30.8 % — AB (ref 39.0–52.0)
HEMOGLOBIN: 9.7 g/dL — AB (ref 13.0–17.0)
MCH: 27.8 pg (ref 26.0–34.0)
MCHC: 31.5 g/dL (ref 30.0–36.0)
MCV: 88.3 fL (ref 78.0–100.0)
Platelets: 255 10*3/uL (ref 150–400)
RBC: 3.49 MIL/uL — AB (ref 4.22–5.81)
RDW: 15.2 % (ref 11.5–15.5)
WBC: 8.2 10*3/uL (ref 4.0–10.5)

## 2016-02-03 LAB — ABO/RH: ABO/RH(D): O POS

## 2016-02-03 LAB — PROTIME-INR
INR: 1.06 (ref 0.00–1.49)
Prothrombin Time: 13.6 seconds (ref 11.6–15.2)

## 2016-02-03 LAB — APTT: aPTT: 30 seconds (ref 24–37)

## 2016-02-04 LAB — HEMOGLOBIN A1C
HEMOGLOBIN A1C: 6.9 % — AB (ref 4.8–5.6)
MEAN PLASMA GLUCOSE: 151 mg/dL

## 2016-02-09 ENCOUNTER — Other Ambulatory Visit: Payer: Self-pay | Admitting: Radiology

## 2016-02-09 NOTE — Progress Notes (Signed)
Pulmonology report reviewed by Dr.Ewell - no action needed

## 2016-02-10 ENCOUNTER — Encounter (HOSPITAL_COMMUNITY): Payer: Self-pay

## 2016-02-10 ENCOUNTER — Ambulatory Visit (HOSPITAL_COMMUNITY): Payer: Federal, State, Local not specified - PPO

## 2016-02-10 ENCOUNTER — Encounter (HOSPITAL_COMMUNITY)
Admission: RE | Disposition: A | Discharge status: Home / Self Care | Payer: Self-pay | Source: Ambulatory Visit | Attending: Interventional Radiology

## 2016-02-10 ENCOUNTER — Ambulatory Visit (HOSPITAL_COMMUNITY): Payer: Federal, State, Local not specified - PPO | Admitting: Anesthesiology

## 2016-02-10 ENCOUNTER — Ambulatory Visit (HOSPITAL_COMMUNITY)
Admission: RE | Admit: 2016-02-10 | Discharge: 2016-02-10 | Disposition: A | Discharge status: Home / Self Care | Payer: Federal, State, Local not specified - PPO | Source: Ambulatory Visit | Attending: Interventional Radiology | Admitting: Interventional Radiology

## 2016-02-10 ENCOUNTER — Encounter (HOSPITAL_COMMUNITY): Payer: Self-pay | Admitting: *Deleted

## 2016-02-10 DIAGNOSIS — Z9221 Personal history of antineoplastic chemotherapy: Secondary | ICD-10-CM | POA: Insufficient documentation

## 2016-02-10 DIAGNOSIS — Z8673 Personal history of transient ischemic attack (TIA), and cerebral infarction without residual deficits: Secondary | ICD-10-CM | POA: Diagnosis not present

## 2016-02-10 DIAGNOSIS — Z923 Personal history of irradiation: Secondary | ICD-10-CM | POA: Diagnosis not present

## 2016-02-10 DIAGNOSIS — Z856 Personal history of leukemia: Secondary | ICD-10-CM | POA: Insufficient documentation

## 2016-02-10 DIAGNOSIS — Z538 Procedure and treatment not carried out for other reasons: Secondary | ICD-10-CM | POA: Diagnosis not present

## 2016-02-10 DIAGNOSIS — Z8572 Personal history of non-Hodgkin lymphomas: Secondary | ICD-10-CM | POA: Insufficient documentation

## 2016-02-10 DIAGNOSIS — Z87891 Personal history of nicotine dependence: Secondary | ICD-10-CM | POA: Insufficient documentation

## 2016-02-10 DIAGNOSIS — Z7952 Long term (current) use of systemic steroids: Secondary | ICD-10-CM | POA: Diagnosis not present

## 2016-02-10 DIAGNOSIS — Z85038 Personal history of other malignant neoplasm of large intestine: Secondary | ICD-10-CM | POA: Insufficient documentation

## 2016-02-10 DIAGNOSIS — Z85118 Personal history of other malignant neoplasm of bronchus and lung: Secondary | ICD-10-CM | POA: Diagnosis not present

## 2016-02-10 DIAGNOSIS — N2889 Other specified disorders of kidney and ureter: Secondary | ICD-10-CM

## 2016-02-10 DIAGNOSIS — Z7984 Long term (current) use of oral hypoglycemic drugs: Secondary | ICD-10-CM | POA: Diagnosis not present

## 2016-02-10 DIAGNOSIS — H409 Unspecified glaucoma: Secondary | ICD-10-CM | POA: Insufficient documentation

## 2016-02-10 DIAGNOSIS — C641 Malignant neoplasm of right kidney, except renal pelvis: Secondary | ICD-10-CM | POA: Diagnosis not present

## 2016-02-10 DIAGNOSIS — Z01818 Encounter for other preprocedural examination: Secondary | ICD-10-CM

## 2016-02-10 DIAGNOSIS — Z79899 Other long term (current) drug therapy: Secondary | ICD-10-CM | POA: Diagnosis not present

## 2016-02-10 DIAGNOSIS — E119 Type 2 diabetes mellitus without complications: Secondary | ICD-10-CM | POA: Diagnosis not present

## 2016-02-10 DIAGNOSIS — J449 Chronic obstructive pulmonary disease, unspecified: Secondary | ICD-10-CM | POA: Diagnosis not present

## 2016-02-10 DIAGNOSIS — I10 Essential (primary) hypertension: Secondary | ICD-10-CM | POA: Insufficient documentation

## 2016-02-10 DIAGNOSIS — Z9981 Dependence on supplemental oxygen: Secondary | ICD-10-CM | POA: Insufficient documentation

## 2016-02-10 LAB — GLUCOSE, CAPILLARY
Glucose-Capillary: 118 mg/dL — ABNORMAL HIGH (ref 65–99)
Glucose-Capillary: 146 mg/dL — ABNORMAL HIGH (ref 65–99)

## 2016-02-10 LAB — TYPE AND SCREEN
ABO/RH(D): O POS
ANTIBODY SCREEN: NEGATIVE

## 2016-02-10 SURGERY — RADIOLOGY WITH ANESTHESIA
Anesthesia: General

## 2016-02-10 MED ORDER — FENTANYL CITRATE (PF) 100 MCG/2ML IJ SOLN
INTRAMUSCULAR | Status: AC
  Filled 2016-02-10: qty 2

## 2016-02-10 MED ORDER — SUGAMMADEX SODIUM 200 MG/2ML IV SOLN
INTRAVENOUS | Status: DC | PRN
  Administered 2016-02-10: 200 mg via INTRAVENOUS

## 2016-02-10 MED ORDER — FAMOTIDINE IN NACL 20-0.9 MG/50ML-% IV SOLN
20.0000 mg | Freq: Once | INTRAVENOUS | Status: AC
  Administered 2016-02-10: 20 mg via INTRAVENOUS
  Filled 2016-02-10: qty 50

## 2016-02-10 MED ORDER — ONDANSETRON HCL 4 MG/2ML IJ SOLN
INTRAMUSCULAR | Status: DC | PRN
  Administered 2016-02-10: 4 mg via INTRAVENOUS

## 2016-02-10 MED ORDER — FENTANYL CITRATE (PF) 100 MCG/2ML IJ SOLN
INTRAMUSCULAR | Status: DC | PRN
  Administered 2016-02-10: 50 ug via INTRAVENOUS

## 2016-02-10 MED ORDER — LACTATED RINGERS IV SOLN
INTRAVENOUS | Status: DC
  Administered 2016-02-10: 11:00:00 via INTRAVENOUS

## 2016-02-10 MED ORDER — PROPOFOL 10 MG/ML IV BOLUS
INTRAVENOUS | Status: DC | PRN
  Administered 2016-02-10: 150 mg via INTRAVENOUS

## 2016-02-10 MED ORDER — CEFAZOLIN SODIUM-DEXTROSE 2-4 GM/100ML-% IV SOLN
2.0000 g | INTRAVENOUS | Status: AC
  Administered 2016-02-10: 2 g via INTRAVENOUS
  Filled 2016-02-10: qty 100

## 2016-02-10 MED ORDER — DIPHENHYDRAMINE HCL 50 MG/ML IJ SOLN
25.0000 mg | Freq: Once | INTRAMUSCULAR | Status: AC
  Administered 2016-02-10: 25 mg via INTRAVENOUS
  Filled 2016-02-10: qty 1

## 2016-02-10 MED ORDER — ROCURONIUM BROMIDE 100 MG/10ML IV SOLN
INTRAVENOUS | Status: DC | PRN
  Administered 2016-02-10: 40 mg via INTRAVENOUS

## 2016-02-10 MED ORDER — SUCCINYLCHOLINE CHLORIDE 20 MG/ML IJ SOLN
INTRAMUSCULAR | Status: DC | PRN
  Administered 2016-02-10: 100 mg via INTRAVENOUS

## 2016-02-10 MED ORDER — DEXAMETHASONE SODIUM PHOSPHATE 10 MG/ML IJ SOLN
INTRAMUSCULAR | Status: DC | PRN
  Administered 2016-02-10: 10 mg via INTRAVENOUS

## 2016-02-10 MED ORDER — METHYLPREDNISOLONE SODIUM SUCC 125 MG IJ SOLR
125.0000 mg | Freq: Once | INTRAMUSCULAR | Status: AC
  Administered 2016-02-10: 125 mg via INTRAVENOUS
  Filled 2016-02-10: qty 2

## 2016-02-10 MED ORDER — LIDOCAINE HCL (CARDIAC) 20 MG/ML IV SOLN
INTRAVENOUS | Status: DC | PRN
  Administered 2016-02-10: 100 mg via INTRAVENOUS

## 2016-02-10 NOTE — Progress Notes (Signed)
Patient ID: Craig Falco., male   DOB: 1951/06/30, 65 y.o.   MRN: [id number]  Patient placed under general anesthesia briefly in CT.  Technical support for cryoablation did not show up today.  There was an issue with scheduling case which is being investigated.  No other choice than to abort procedure today and will reschedule ablation of the right renal carcinoma.  Will likely be able to discharge patient from PACU once cleared by Anesthesia.  Craig [name]. Kathlene Cote, M.D Pager:  [phone]

## 2016-02-10 NOTE — Progress Notes (Signed)
Patient ID: Craig [name]., male   DOB: May 28, 1951, 65 y.o.   MRN: [id number]    Referring Physician(s): [facility]   Supervising Physician: Aletta Edouard  Patient Status: OP  Chief Complaint:  Right renal cell carcinoma  Subjective: Patient presented to hospital today for elective CT-guided percutaneous cryoablation of a right renal cell carcinoma. Consent obtained. Patient was placed under general anesthesia briefly in CT. Technical support for cryoablation did not show up and therefore cryoablation was not performed. Procedure was canceled and has been rescheduled for 03/23/2016. Patient was transported to PACU and subsequently short stay center for short observation. Patient is currently stable with no acute complaints.   Allergies: Iodine and Nexium  Medications: Prior to Admission medications   Medication Sig Start Date End Date Taking? Authorizing Provider  albuterol (PROVENTIL HFA;VENTOLIN HFA) 108 (90 BASE) MCG/ACT inhaler Inhale 2 puffs into the lungs every 6 (six) hours as needed for wheezing or shortness of breath. Reported on 12/21/2015   Yes Historical Provider, MD  albuterol (PROVENTIL) (2.5 MG/3ML) 0.083% nebulizer solution USE 1 VIAL IN NEBULIZER EVERY 3-4 HOURS AS NEEDED 07/05/15  Yes Forest Gleason, MD  budesonide-formoterol (SYMBICORT) 160-4.5 MCG/ACT inhaler Inhale 2 puffs into the lungs 2 (two) times daily. Reported on 12/21/2015   Yes Historical Provider, MD  diphenhydrAMINE (BENADRYL) 12.5 MG chewable tablet Chew 25 mg by mouth daily as needed for allergies. Reported on 12/21/2015   Yes Historical Provider, MD  glipiZIDE (GLUCOTROL) 10 MG tablet TAKE 1 TABLET BY MOUTH DAILY 08/16/15  Yes Forest Gleason, MD  HYDROcodone-acetaminophen (NORCO/VICODIN) 5-325 MG tablet Take 1-2 tablets by mouth every 6 (six) hours as needed for moderate pain. 01/30/16  Yes Lloyd Huger, MD  latanoprost (XALATAN) 0.005 % ophthalmic solution Place 1 drop into both eyes every other day.   04/19/15  Yes Historical Provider, MD  loperamide (IMODIUM A-D) 2 MG tablet Take 2 mg by mouth 4 (four) times daily as needed for diarrhea or loose stools. Reported on 12/21/2015   Yes Historical Provider, MD  losartan-hydrochlorothiazide (HYZAAR) 50-12.5 MG per tablet Take 1 tablet by mouth daily.   Yes Historical Provider, MD  pioglitazone (ACTOS) 30 MG tablet Take 30 mg by mouth daily.   Yes Historical Provider, MD  predniSONE (DELTASONE) 10 MG tablet Take 10 mg by mouth daily with breakfast.   Yes Historical Provider, MD  temazepam (RESTORIL) 7.5 MG capsule Take 1-2 capsules by mouth at bedtime as needed for sleep. Reported on 12/21/2015 05/02/15  Yes Historical Provider, MD  theophylline (UNIPHYL) 400 MG 24 hr tablet Take 400 mg by mouth daily as needed (breathing.). Reported on 12/21/2015 11/11/15  Yes Historical Provider, MD  tiotropium (SPIRIVA) 18 MCG inhalation capsule Place 18 mcg into inhaler and inhale daily. Reported on 12/21/2015   Yes Historical Provider, MD  cephALEXin (KEFLEX) 500 MG capsule Take 1 capsule (500 mg total) by mouth 3 (three) times daily. Patient not taking: Reported on 01/26/2016 12/23/15   [name], MD  LORazepam (ATIVAN) 1 MG tablet Take 1 tablet 1 hour prior to scan. Be sure to have a driver to take you to appointment Patient not taking: Reported on 01/26/2016 01/12/16   [name], NP     Vital Signs: BP 128/76 mmHg  Pulse 78  Temp(Src) 98.8 F (37.1 C) (Oral)  Resp 12  Ht '5\' 8"'$  (1.727 m)  Wt 152 lb (68.947 kg)  BMI 23.12 kg/m2  SpO2 100%  Physical Exam patient awake, alert. Chest  with distant breath sounds bilaterally. Heart with regular rate rhythm. Abdomen soft, positive bowel sounds, nontender. Lower extremities with  no edema.  Imaging: Dg Chest 1 View  02/10/2016  CLINICAL DATA:  Preoperative for CT guided right renal mass ablation EXAM: CHEST 1 VIEW COMPARISON:  11/19/2015 chest radiograph. FINDINGS: Left subclavian Port-A-Cath terminates  in the middle third of the superior vena cava. Stable cardiomediastinal silhouette with normal heart size. No pneumothorax. No pleural effusion. Hyperinflated lungs. No pulmonary edema. No acute consolidative airspace disease. Stable mild platelike scarring in the right lung apex. Stable healed deformities in the bilateral ribs. Hyperinflated lungs. IMPRESSION: No acute cardiopulmonary disease. Stable hyperinflated lungs and apical right upper lung scarring. Electronically Signed   By: Ilona Sorrel M.D.   On: 02/10/2016 10:27    Labs:  CBC:  Recent Labs  11/19/15 1600 11/20/15 0649 12/07/15 1112 02/03/16 1153  WBC 11.9* 9.0 8.3 8.2  HGB 10.4* 9.4* 11.1* 9.7*  HCT 32.8* 28.9* 34.7* 30.8*  PLT 321 277 148* 255    COAGS:  Recent Labs  02/03/16 1153  INR 1.06  APTT 30    BMP:  Recent Labs  11/19/15 1600 11/20/15 0649 12/07/15 1112 01/17/16 0929 02/03/16 1153  NA 137 138 137  --  145  K 5.3* 4.9 4.5  --  4.7  CL 95* 100* 95*  --  107  CO2 32 28 37*  --  35*  GLUCOSE 160* 183* 189*  --  155*  BUN 17 23* 22*  --  24*  CALCIUM 9.5 8.5* 8.9  --  9.2  CREATININE 1.21 1.30* 1.17 1.10 1.41*  GFRNONAA >60 56* >60  --  51*  GFRAA >60 >60 >60  --  59*    LIVER FUNCTION TESTS:  Recent Labs  10/26/15 1003 11/19/15 1600 11/20/15 0649 12/07/15 1112  BILITOT 0.4 0.3 0.4 0.4  AST '16 17 16 '$ 13*  ALT 12* 14* 13* 15*  ALKPHOS 74 81 64 58  PROT 7.6 8.0 7.1 6.7  ALBUMIN 4.4 3.9 3.4* 3.7    Assessment and Plan: Patient with known enlarging right renal cell carcinoma, previously scheduled for CT-guided percutaneous cryoablation of the right RCC today, however technical support for cryoablation did not show up, therefore procedure was canceled and has been rescheduled for 03/23/2016 at [facility] . He will be discharged home. Preprocedure details given to patient. He will resume all current home medications. He is scheduled to follow up with his oncologist later next  month. He was told to contact our service in the interim with any additional questions.   Electronically Signed: D. Rowe Robert 02/10/2016, 2:05 PM   I spent a total of 15 minutes at the the patient's bedside AND on the patient's hospital floor or unit, greater than 50% of which was counseling/coordinating care for right renal cell carcinoma cryoablation

## 2016-02-10 NOTE — Anesthesia Procedure Notes (Signed)
Procedure Name: Intubation Date/Time: 02/10/2016 11:31 AM Performed by: Lind Covert Pre-anesthesia Checklist: Patient identified, Emergency Drugs available, Suction available, Patient being monitored and Timeout performed Patient Re-evaluated:Patient Re-evaluated prior to inductionOxygen Delivery Method: Circle system utilized Preoxygenation: Pre-oxygenation with 100% oxygen Intubation Type: IV induction Laryngoscope Size: Mac and 3 Grade View: Grade I Tube type: Oral Tube size: 7.5 mm Number of attempts: 1 Airway Equipment and Method: Stylet Placement Confirmation: ETT inserted through vocal cords under direct vision,  positive ETCO2 and breath sounds checked- equal and bilateral Secured at: 22 cm Tube secured with: Tape Dental Injury: Teeth and Oropharynx as per pre-operative assessment

## 2016-02-10 NOTE — H&P (Signed)
Referring Physician(s): JSHFWY,OVZCH  Supervising Physician: Aletta Edouard  Patient Status: OP TBA  Chief Complaint: Right renal cell carcinoma   Subjective: Patient familiar to IR service from prior CT-guided right lung mass biopsy in 2011 as well as right renal lesion biopsy in 2015. He was seen recently in consultation by Dr. Kathlene Cote on 12/29/15 for treatment options regarding the renal cell carcinoma. Due to oxygen dependency from COPD and other medical comorbidities urology felt that Craig [name] was a relatively poor surgical candidate for nephrectomy.He was deemed an appropriate candidate for CT-guided cryoablation of the enlarging right renal cell carcinoma and presents today for the above procedure. Latest MRI of the abdomen on 01/17/16 revealed an approximately 4.7 cm right upper pole renal cell carcinoma as well as upper abdominal/retroperitoneal lymphadenopathy.  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 He currently denies fever, headache, chest pain, abdominal/back pain, nausea, vomiting or abnormal bleeding. He does have some occasional dyspnea with exertion and occasional cough but no hemoptysis. Additional history as below. Past Medical History  Diagnosis Date  . COPD (chronic obstructive pulmonary disease) ([place])   . Hypertension   . Diabetes mellitus without complication ([place])   . Respiratory failure ([place]) 07/12/2010    acute  . Glaucoma   . Right renal mass   . Supplemental oxygen dependent     USES 3 LITERS CONTINUOUSLY  . Shortness of breath dyspnea   . Asthma   . Stroke ([place]) 1990'S    NO RESIDUAL PROBLEMS  . GERD (gastroesophageal reflux disease)   . Difficulty sleeping   . Lung cancer ([place])     right upper lobe mass positive for adenocarcinoma  . Cancer ([place])   . Renal cancer ([place])   . Lymphoma ([facility])     low grade  . Colon cancer  [facility])   History reviewed. No pertinent past surgical history.    Allergies: Iodine and Nexium  Medications: Prior to Admission medications   Medication Sig Start Date End Date Taking? Authorizing Provider  albuterol (PROVENTIL HFA;VENTOLIN HFA) 108 (90 BASE) MCG/ACT inhaler Inhale 2 puffs into the lungs every 6 (six) hours as needed for wheezing or shortness of breath. Reported on 12/21/2015   Yes Historical Provider, MD  albuterol (PROVENTIL) (2.5 MG/3ML) 0.083% nebulizer solution USE 1 VIAL IN NEBULIZER EVERY 3-4 HOURS AS NEEDED 07/05/15  Yes Forest Gleason, MD  budesonide-formoterol (SYMBICORT) 160-4.5 MCG/ACT inhaler Inhale 2 puffs into the lungs 2 (two) times daily. Reported on 12/21/2015   Yes Historical Provider, MD  diphenhydrAMINE (BENADRYL) 12.5 MG chewable tablet Chew 25 mg by mouth daily as needed for allergies. Reported on 12/21/2015   Yes Historical Provider, MD  glipiZIDE (GLUCOTROL) 10 MG tablet TAKE 1 TABLET BY MOUTH DAILY 08/16/15  Yes Forest Gleason, MD  HYDROcodone-acetaminophen (NORCO/VICODIN) 5-325 MG tablet Take 1-2 tablets by mouth every 6 (six) hours as needed for moderate pain. 01/30/16  Yes Lloyd Huger, MD  latanoprost (XALATAN) 0.005 % ophthalmic solution Place 1 drop into both eyes every other day.  04/19/15  Yes Historical Provider, MD  loperamide (IMODIUM A-D) 2 MG tablet Take 2 mg by mouth 4 (four) times daily as needed for diarrhea or loose stools. Reported on 12/21/2015   Yes Historical Provider, MD  losartan-hydrochlorothiazide (HYZAAR) 50-12.5 MG per tablet Take 1 tablet by mouth daily.   Yes Historical Provider, MD  pioglitazone (ACTOS) 30  MG tablet Take 30 mg by mouth daily.   Yes Historical Provider, MD  predniSONE (DELTASONE) 10 MG tablet Take 10 mg by mouth daily with breakfast.   Yes Historical Provider, MD  temazepam (RESTORIL) 7.5 MG capsule Take 1-2 capsules by mouth at bedtime as needed for sleep. Reported on 12/21/2015 05/02/15  Yes Historical Provider, MD    theophylline (UNIPHYL) 400 MG 24 hr tablet Take 400 mg by mouth daily as needed (breathing.). Reported on 12/21/2015 11/11/15  Yes Historical Provider, MD  tiotropium (SPIRIVA) 18 MCG inhalation capsule Place 18 mcg into inhaler and inhale daily. Reported on 12/21/2015   Yes Historical Provider, MD  cephALEXin (KEFLEX) 500 MG capsule Take 1 capsule (500 mg total) by mouth 3 (three) times daily. Patient not taking: Reported on 01/26/2016 12/23/15   [name], MD  LORazepam (ATIVAN) 1 MG tablet Take 1 tablet 1 hour prior to scan. Be sure to have a driver to take you to appointment Patient not taking: Reported on 01/26/2016 01/12/16   [name], NP     Vital Signs: BP 135/82 mmHg  Pulse 93  Temp(Src) 99.5 F (37.5 C) (Oral)  Resp 20  Ht '5\' 8"'$  (1.727 m)  Wt 152 lb (68.947 kg)  BMI 23.12 kg/m2  SpO2 100%  Physical Exam patient awake, alert. Chest with distant breath sounds bilaterally. Heart with regular rate and rhythm. Abdomen soft, positive bowel sounds, nontender. Extremities with full range of motion and no significant edema.  Imaging: Dg Chest 1 View  02/10/2016  CLINICAL DATA:  Preoperative for CT guided right renal mass ablation EXAM: CHEST 1 VIEW COMPARISON:  11/19/2015 chest radiograph. FINDINGS: Left subclavian Port-A-Cath terminates in the middle third of the superior vena cava. Stable cardiomediastinal silhouette with normal heart size. No pneumothorax. No pleural effusion. Hyperinflated lungs. No pulmonary edema. No acute consolidative airspace disease. Stable mild platelike scarring in the right lung apex. Stable healed deformities in the bilateral ribs. Hyperinflated lungs. IMPRESSION: No acute cardiopulmonary disease. Stable hyperinflated lungs and apical right upper lung scarring. Electronically Signed   By: Ilona Sorrel M.D.   On: 02/10/2016 10:27    Labs:  CBC:  Recent Labs  11/19/15 1600 11/20/15 0649 12/07/15 1112 02/03/16 1153  WBC 11.9* 9.0 8.3 8.2   HGB 10.4* 9.4* 11.1* 9.7*  HCT 32.8* 28.9* 34.7* 30.8*  PLT 321 277 148* 255    COAGS:  Recent Labs  02/03/16 1153  INR 1.06  APTT 30    BMP:  Recent Labs  11/19/15 1600 11/20/15 0649 12/07/15 1112 01/17/16 0929 02/03/16 1153  NA 137 138 137  --  145  K 5.3* 4.9 4.5  --  4.7  CL 95* 100* 95*  --  107  CO2 32 28 37*  --  35*  GLUCOSE 160* 183* 189*  --  155*  BUN 17 23* 22*  --  24*  CALCIUM 9.5 8.5* 8.9  --  9.2  CREATININE 1.21 1.30* 1.17 1.10 1.41*  GFRNONAA >60 56* >60  --  51*  GFRAA >60 >60 >60  --  59*    LIVER FUNCTION TESTS:  Recent Labs  10/26/15 1003 11/19/15 1600 11/20/15 0649 12/07/15 1112  BILITOT 0.4 0.3 0.4 0.4  AST '16 17 16 '$ 13*  ALT 12* 14* 13* 15*  ALKPHOS 74 81 64 58  PROT 7.6 8.0 7.1 6.7  ALBUMIN 4.4 3.9 3.4* 3.7    Assessment and Plan: Patient with remote history of right lung adenocarcinoma,  CLL and now with recently diagnosed and enlarging right renal cell carcinoma. Patient poor surgical candidate due to COPD and additional medical comorbidities. Seen recently in consultation by Dr. Kathlene Cote and deemed an appropriate candidate for percutaneous cryoablation of the right renal mass. He presents today for the above procedure. Details/risks of procedure, including but not limited to, internal bleeding, infection, contrast nephropathy, anesthesia-related complications, discussed with patient with his understanding and consent. Postprocedure the patient will be admitted for overnight observation. Creatinine 02/03/16 was 1.4.    Electronically Signed: D. Rowe Robert 02/10/2016, 10:45 AM   I spent a total of 30 minutes at the the patient's bedside AND on the patient's hospital floor or unit, greater than 50% of which was counseling/coordinating care for CT-guided percutaneous cryoablation of right renal cell carcinoma

## 2016-02-10 NOTE — Transfer of Care (Signed)
Immediate Anesthesia Transfer of Care Note  Patient: Craig [name].  Procedure(s) Performed: Procedure(s): RENAL CRYOABLATION (N/A)  Patient Location: PACU  Anesthesia Type:General  Level of Consciousness: sedated  Airway & Oxygen Therapy: Patient Spontanous Breathing and Patient connected to nasal cannula oxygen  Post-op Assessment: Report given to RN and Post -op Vital signs reviewed and stable  Post vital signs: Reviewed and stable  Last Vitals:  Filed Vitals:   02/10/16 0959  BP: 135/82  Pulse: 93  Temp: 37.5 C  Resp: 20    Last Pain:  Filed Vitals:   02/10/16 1028  PainSc: 0-No pain      Patients Stated Pain Goal: 5 (40/34/74 2595)  Complications: No apparent anesthesia complications

## 2016-02-10 NOTE — Anesthesia Postprocedure Evaluation (Signed)
Anesthesia Post Note  Patient: Craig [name].  Procedure(s) Performed: * No procedures listed *  Patient location during evaluation: PACU Anesthesia Type: General Level of consciousness: awake and alert Pain management: pain level controlled Vital Signs Assessment: post-procedure vital signs reviewed and stable Respiratory status: spontaneous breathing, nonlabored ventilation, respiratory function stable and patient connected to nasal cannula oxygen Cardiovascular status: blood pressure returned to baseline and stable Postop Assessment: no signs of nausea or vomiting Anesthetic complications: no    Last Vitals: There were no vitals filed for this visit.  Last Pain: There were no vitals filed for this visit.               [place]

## 2016-02-10 NOTE — Anesthesia Preprocedure Evaluation (Signed)
Anesthesia Evaluation  Patient identified by MRN, date of birth, ID band Patient awake    Reviewed: Allergy & Precautions, NPO status , Patient's Chart, lab work & pertinent test results  Airway Mallampati: I  TM Distance: >3 FB Neck ROM: Full    Dental   Pulmonary COPD,  oxygen dependent, former smoker,    Pulmonary exam normal        Cardiovascular hypertension, Pt. on medications Normal cardiovascular exam     Neuro/Psych CVA    GI/Hepatic   Endo/Other  diabetes, Type 2, Oral Hypoglycemic Agents  Renal/GU      Musculoskeletal   Abdominal   Peds  Hematology   Anesthesia Other Findings   Reproductive/Obstetrics                             Anesthesia Physical Anesthesia Plan  ASA: III  Anesthesia Plan: General   Post-op Pain Management:    Induction: Intravenous  Airway Management Planned: Oral ETT  Additional Equipment:   Intra-op Plan:   Post-operative Plan: Extubation in OR  Informed Consent: I have reviewed the patients History and Physical, chart, labs and discussed the procedure including the risks, benefits and alternatives for the proposed anesthesia with the patient or authorized representative who has indicated his/her understanding and acceptance.     Plan Discussed with: CRNA and Surgeon  Anesthesia Plan Comments:         Anesthesia Quick Evaluation

## 2016-02-13 DIAGNOSIS — E119 Type 2 diabetes mellitus without complications: Secondary | ICD-10-CM | POA: Diagnosis not present

## 2016-02-13 DIAGNOSIS — I10 Essential (primary) hypertension: Secondary | ICD-10-CM | POA: Diagnosis not present

## 2016-02-17 DIAGNOSIS — C3491 Malignant neoplasm of unspecified part of right bronchus or lung: Secondary | ICD-10-CM | POA: Diagnosis not present

## 2016-02-17 DIAGNOSIS — J984 Other disorders of lung: Secondary | ICD-10-CM | POA: Diagnosis not present

## 2016-02-17 DIAGNOSIS — I10 Essential (primary) hypertension: Secondary | ICD-10-CM | POA: Diagnosis not present

## 2016-02-17 DIAGNOSIS — E119 Type 2 diabetes mellitus without complications: Secondary | ICD-10-CM | POA: Diagnosis not present

## 2016-02-20 ENCOUNTER — Other Ambulatory Visit: Payer: Self-pay | Admitting: General Surgery

## 2016-02-22 ENCOUNTER — Other Ambulatory Visit: Payer: Self-pay | Admitting: *Deleted

## 2016-02-22 DIAGNOSIS — C3492 Malignant neoplasm of unspecified part of left bronchus or lung: Secondary | ICD-10-CM

## 2016-02-22 DIAGNOSIS — C641 Malignant neoplasm of right kidney, except renal pelvis: Secondary | ICD-10-CM

## 2016-02-22 MED ORDER — HYDROCODONE-ACETAMINOPHEN 5-325 MG PO TABS: 1.0000 | ORAL_TABLET | Freq: Four times a day (QID) | ORAL | Status: DC | PRN

## 2016-02-28 ENCOUNTER — Other Ambulatory Visit: Payer: Self-pay | Admitting: Radiology

## 2016-02-28 ENCOUNTER — Other Ambulatory Visit: Payer: Self-pay | Admitting: General Surgery

## 2016-02-28 ENCOUNTER — Encounter (HOSPITAL_COMMUNITY): Payer: Self-pay | Admitting: *Deleted

## 2016-02-29 ENCOUNTER — Encounter (HOSPITAL_COMMUNITY): Payer: Self-pay | Admitting: General Practice

## 2016-02-29 ENCOUNTER — Ambulatory Visit (HOSPITAL_COMMUNITY)
Admission: RE | Admit: 2016-02-29 | Discharge: 2016-02-29 | Disposition: A | Discharge status: Home / Self Care | Payer: Commercial Managed Care - HMO | Source: Ambulatory Visit | Attending: Interventional Radiology | Admitting: Interventional Radiology

## 2016-02-29 ENCOUNTER — Encounter (HOSPITAL_COMMUNITY)
Admission: RE | Disposition: A | Discharge status: Home / Self Care | Payer: Self-pay | Source: Ambulatory Visit | Attending: Interventional Radiology

## 2016-02-29 ENCOUNTER — Ambulatory Visit (HOSPITAL_COMMUNITY): Payer: Commercial Managed Care - HMO | Admitting: Certified Registered Nurse Anesthetist

## 2016-02-29 ENCOUNTER — Encounter (HOSPITAL_COMMUNITY): Payer: Self-pay | Admitting: Certified Registered Nurse Anesthetist

## 2016-02-29 ENCOUNTER — Encounter (HOSPITAL_COMMUNITY): Payer: Self-pay

## 2016-02-29 ENCOUNTER — Observation Stay (HOSPITAL_COMMUNITY)
Admission: RE | Admit: 2016-02-29 | Discharge: 2016-03-01 | Disposition: A | Discharge status: Home / Self Care | Payer: Commercial Managed Care - HMO | Source: Ambulatory Visit | Attending: Interventional Radiology | Admitting: Interventional Radiology

## 2016-02-29 DIAGNOSIS — C3490 Malignant neoplasm of unspecified part of unspecified bronchus or lung: Secondary | ICD-10-CM | POA: Diagnosis not present

## 2016-02-29 DIAGNOSIS — Z85118 Personal history of other malignant neoplasm of bronchus and lung: Secondary | ICD-10-CM | POA: Diagnosis not present

## 2016-02-29 DIAGNOSIS — Z87891 Personal history of nicotine dependence: Secondary | ICD-10-CM | POA: Insufficient documentation

## 2016-02-29 DIAGNOSIS — Z7984 Long term (current) use of oral hypoglycemic drugs: Secondary | ICD-10-CM | POA: Diagnosis not present

## 2016-02-29 DIAGNOSIS — Z85038 Personal history of other malignant neoplasm of large intestine: Secondary | ICD-10-CM | POA: Insufficient documentation

## 2016-02-29 DIAGNOSIS — C641 Malignant neoplasm of right kidney, except renal pelvis: Principal | ICD-10-CM | POA: Insufficient documentation

## 2016-02-29 DIAGNOSIS — J449 Chronic obstructive pulmonary disease, unspecified: Secondary | ICD-10-CM | POA: Diagnosis not present

## 2016-02-29 DIAGNOSIS — Z8673 Personal history of transient ischemic attack (TIA), and cerebral infarction without residual deficits: Secondary | ICD-10-CM | POA: Diagnosis not present

## 2016-02-29 DIAGNOSIS — I10 Essential (primary) hypertension: Secondary | ICD-10-CM | POA: Diagnosis not present

## 2016-02-29 DIAGNOSIS — C649 Malignant neoplasm of unspecified kidney, except renal pelvis: Secondary | ICD-10-CM | POA: Diagnosis present

## 2016-02-29 DIAGNOSIS — N2889 Other specified disorders of kidney and ureter: Secondary | ICD-10-CM | POA: Diagnosis not present

## 2016-02-29 DIAGNOSIS — E119 Type 2 diabetes mellitus without complications: Secondary | ICD-10-CM | POA: Diagnosis not present

## 2016-02-29 HISTORY — DX: Personal history of other diseases of the respiratory system: Z87.09

## 2016-02-29 HISTORY — DX: Cardiac arrhythmia, unspecified: I49.9

## 2016-02-29 LAB — BASIC METABOLIC PANEL
ANION GAP: 6 (ref 5–15)
BUN: 26 mg/dL — ABNORMAL HIGH (ref 6–20)
CALCIUM: 8.9 mg/dL (ref 8.9–10.3)
CHLORIDE: 98 mmol/L — AB (ref 101–111)
CO2: 33 mmol/L — AB (ref 22–32)
Creatinine, Ser: 1.3 mg/dL — ABNORMAL HIGH (ref 0.61–1.24)
GFR calc non Af Amer: 56 mL/min — ABNORMAL LOW (ref >60)
Glucose, Bld: 150 mg/dL — ABNORMAL HIGH (ref 65–99)
Potassium: 4.7 mmol/L (ref 3.5–5.1)
Sodium: 137 mmol/L (ref 135–145)

## 2016-02-29 LAB — PROTIME-INR
INR: 0.98 (ref 0.00–1.49)
PROTHROMBIN TIME: 13.2 s (ref 11.6–15.2)

## 2016-02-29 LAB — CBC
HCT: 29.2 % — ABNORMAL LOW (ref 39.0–52.0)
HEMOGLOBIN: 9.5 g/dL — AB (ref 13.0–17.0)
MCH: 28.4 pg (ref 26.0–34.0)
MCHC: 32.5 g/dL (ref 30.0–36.0)
MCV: 87.4 fL (ref 78.0–100.0)
Platelets: 159 10*3/uL (ref 150–400)
RBC: 3.34 MIL/uL — AB (ref 4.22–5.81)
RDW: 14.5 % (ref 11.5–15.5)
WBC: 8.5 10*3/uL (ref 4.0–10.5)

## 2016-02-29 LAB — GLUCOSE, CAPILLARY
Glucose-Capillary: 141 mg/dL — ABNORMAL HIGH (ref 65–99)
Glucose-Capillary: 139 mg/dL — ABNORMAL HIGH (ref 65–99)
Glucose-Capillary: 242 mg/dL — ABNORMAL HIGH (ref 65–99)

## 2016-02-29 LAB — TYPE AND SCREEN
ABO/RH(D): O POS
Antibody Screen: NEGATIVE

## 2016-02-29 LAB — APTT: aPTT: 30 seconds (ref 24–37)

## 2016-02-29 SURGERY — RADIO FREQUENCY ABLATION
Anesthesia: General | Laterality: Right

## 2016-02-29 MED ORDER — SODIUM CHLORIDE 0.9 % IV SOLN
INTRAVENOUS | Status: DC
  Administered 2016-02-29: 18:00:00 via INTRAVENOUS

## 2016-02-29 MED ORDER — PIOGLITAZONE HCL 30 MG PO TABS
30.0000 mg | ORAL_TABLET | Freq: Every day | ORAL | Status: DC
  Administered 2016-03-01: 30 mg via ORAL
  Filled 2016-02-29 (×2): qty 1

## 2016-02-29 MED ORDER — ROCURONIUM BROMIDE 100 MG/10ML IV SOLN
INTRAVENOUS | Status: DC | PRN
  Administered 2016-02-29: 5 mg via INTRAVENOUS
  Administered 2016-02-29: 10 mg via INTRAVENOUS
  Administered 2016-02-29: 5 mg via INTRAVENOUS
  Administered 2016-02-29: 10 mg via INTRAVENOUS
  Administered 2016-02-29: 5 mg via INTRAVENOUS
  Administered 2016-02-29: 10 mg via INTRAVENOUS
  Administered 2016-02-29: 40 mg via INTRAVENOUS
  Administered 2016-02-29 (×2): 5 mg via INTRAVENOUS
  Administered 2016-02-29: 10 mg via INTRAVENOUS

## 2016-02-29 MED ORDER — DEXAMETHASONE SODIUM PHOSPHATE 10 MG/ML IJ SOLN
INTRAMUSCULAR | Status: DC | PRN
  Administered 2016-02-29: 5 mg via INTRAVENOUS

## 2016-02-29 MED ORDER — FENTANYL CITRATE (PF) 100 MCG/2ML IJ SOLN
INTRAMUSCULAR | Status: DC | PRN
  Administered 2016-02-29: 25 ug via INTRAVENOUS
  Administered 2016-02-29: 50 ug via INTRAVENOUS
  Administered 2016-02-29: 25 ug via INTRAVENOUS

## 2016-02-29 MED ORDER — DIPHENHYDRAMINE HCL 50 MG PO CAPS
50.0000 mg | ORAL_CAPSULE | Freq: Once | ORAL | Status: AC
  Administered 2016-02-29: 50 mg via ORAL
  Filled 2016-02-29: qty 1
  Filled 2016-02-29: qty 2

## 2016-02-29 MED ORDER — TIOTROPIUM BROMIDE MONOHYDRATE 18 MCG IN CAPS
18.0000 ug | ORAL_CAPSULE | Freq: Every day | RESPIRATORY_TRACT | Status: DC
  Administered 2016-03-01: 18 ug via RESPIRATORY_TRACT
  Filled 2016-02-29: qty 5

## 2016-02-29 MED ORDER — GLIPIZIDE 10 MG PO TABS
10.0000 mg | ORAL_TABLET | Freq: Every day | ORAL | Status: DC
  Administered 2016-03-01: 10 mg via ORAL
  Filled 2016-02-29: qty 1

## 2016-02-29 MED ORDER — LOSARTAN POTASSIUM-HCTZ 50-12.5 MG PO TABS: 1.0000 | ORAL_TABLET | Freq: Every day | ORAL | Status: DC

## 2016-02-29 MED ORDER — LIDOCAINE HCL (CARDIAC) 20 MG/ML IV SOLN
INTRAVENOUS | Status: DC | PRN
  Administered 2016-02-29: 25 mg via INTRAVENOUS
  Administered 2016-02-29: 50 mg via INTRAVENOUS

## 2016-02-29 MED ORDER — SUCCINYLCHOLINE CHLORIDE 20 MG/ML IJ SOLN
INTRAMUSCULAR | Status: DC | PRN
  Administered 2016-02-29: 100 mg via INTRAVENOUS

## 2016-02-29 MED ORDER — SENNOSIDES-DOCUSATE SODIUM 8.6-50 MG PO TABS
1.0000 | ORAL_TABLET | Freq: Every day | ORAL | Status: DC | PRN
  Filled 2016-02-29: qty 1

## 2016-02-29 MED ORDER — ONDANSETRON HCL 4 MG/2ML IJ SOLN
INTRAMUSCULAR | Status: DC | PRN
  Administered 2016-02-29: 4 mg via INTRAVENOUS

## 2016-02-29 MED ORDER — LOSARTAN POTASSIUM 50 MG PO TABS
50.0000 mg | ORAL_TABLET | Freq: Every day | ORAL | Status: DC
  Administered 2016-03-01: 50 mg via ORAL
  Filled 2016-02-29: qty 1

## 2016-02-29 MED ORDER — LOPERAMIDE HCL 2 MG PO CAPS: 2.0000 mg | ORAL_CAPSULE | Freq: Four times a day (QID) | ORAL | Status: DC | PRN

## 2016-02-29 MED ORDER — ALBUTEROL SULFATE (2.5 MG/3ML) 0.083% IN NEBU
2.5000 mg | INHALATION_SOLUTION | RESPIRATORY_TRACT | Status: DC | PRN
  Filled 2016-02-29: qty 3

## 2016-02-29 MED ORDER — THEOPHYLLINE ER 400 MG PO TB24
400.0000 mg | ORAL_TABLET | Freq: Every day | ORAL | Status: DC | PRN
  Filled 2016-02-29: qty 1

## 2016-02-29 MED ORDER — HYDROCODONE-ACETAMINOPHEN 5-325 MG PO TABS
1.0000 | ORAL_TABLET | ORAL | Status: DC | PRN
  Administered 2016-03-01: 2 via ORAL
  Filled 2016-02-29: qty 2

## 2016-02-29 MED ORDER — DOCUSATE SODIUM 100 MG PO CAPS
100.0000 mg | ORAL_CAPSULE | Freq: Two times a day (BID) | ORAL | Status: DC
  Administered 2016-02-29 – 2016-03-01 (×2): 100 mg via ORAL
  Filled 2016-02-29 (×5): qty 1

## 2016-02-29 MED ORDER — FENTANYL CITRATE (PF) 250 MCG/5ML IJ SOLN
INTRAMUSCULAR | Status: AC
  Filled 2016-02-29: qty 5

## 2016-02-29 MED ORDER — ONDANSETRON HCL 4 MG/2ML IJ SOLN: 4.0000 mg | Freq: Four times a day (QID) | INTRAMUSCULAR | Status: DC | PRN

## 2016-02-29 MED ORDER — ONDANSETRON HCL 4 MG/2ML IJ SOLN: 4.0000 mg | Freq: Once | INTRAMUSCULAR | Status: DC | PRN

## 2016-02-29 MED ORDER — HYDROCHLOROTHIAZIDE 12.5 MG PO CAPS
12.5000 mg | ORAL_CAPSULE | Freq: Every day | ORAL | Status: DC
  Administered 2016-03-01: 12.5 mg via ORAL
  Filled 2016-02-29: qty 1

## 2016-02-29 MED ORDER — HYDROMORPHONE HCL 1 MG/ML IJ SOLN: 0.5000 mg | INTRAMUSCULAR | Status: DC | PRN

## 2016-02-29 MED ORDER — CEFAZOLIN SODIUM-DEXTROSE 2-4 GM/100ML-% IV SOLN
2.0000 g | INTRAVENOUS | Status: AC
  Administered 2016-02-29: 2 g via INTRAVENOUS
  Filled 2016-02-29: qty 100

## 2016-02-29 MED ORDER — ROCURONIUM BROMIDE 50 MG/5ML IV SOLN
INTRAVENOUS | Status: AC
  Filled 2016-02-29: qty 1

## 2016-02-29 MED ORDER — MOMETASONE FURO-FORMOTEROL FUM 200-5 MCG/ACT IN AERO
2.0000 | INHALATION_SPRAY | Freq: Two times a day (BID) | RESPIRATORY_TRACT | Status: DC
  Administered 2016-02-29 – 2016-03-01 (×2): 2 via RESPIRATORY_TRACT
  Filled 2016-02-29: qty 8.8

## 2016-02-29 MED ORDER — TEMAZEPAM 7.5 MG PO CAPS
7.5000 mg | ORAL_CAPSULE | Freq: Every evening | ORAL | Status: DC | PRN
  Administered 2016-02-29: 15 mg via ORAL
  Filled 2016-02-29: qty 2

## 2016-02-29 MED ORDER — LACTATED RINGERS IV SOLN
INTRAVENOUS | Status: DC
  Administered 2016-02-29: 11:00:00 via INTRAVENOUS

## 2016-02-29 MED ORDER — SUGAMMADEX SODIUM 200 MG/2ML IV SOLN
INTRAVENOUS | Status: DC | PRN
  Administered 2016-02-29: 200 mg via INTRAVENOUS

## 2016-02-29 MED ORDER — ALBUTEROL SULFATE HFA 108 (90 BASE) MCG/ACT IN AERS
2.0000 | INHALATION_SPRAY | Freq: Four times a day (QID) | RESPIRATORY_TRACT | Status: DC | PRN
Start: 2016-02-29 — End: 2016-02-29

## 2016-02-29 MED ORDER — PREDNISONE 5 MG PO TABS
10.0000 mg | ORAL_TABLET | Freq: Every day | ORAL | Status: DC
  Administered 2016-03-01: 10 mg via ORAL
  Filled 2016-02-29: qty 2

## 2016-02-29 MED ORDER — IOPAMIDOL (ISOVUE-300) INJECTION 61%: 75.0000 mL | Freq: Once | INTRAVENOUS | Status: DC | PRN

## 2016-02-29 MED ORDER — ALBUTEROL SULFATE HFA 108 (90 BASE) MCG/ACT IN AERS
INHALATION_SPRAY | RESPIRATORY_TRACT | Status: DC | PRN
  Administered 2016-02-29: 2 via RESPIRATORY_TRACT

## 2016-02-29 MED ORDER — FENTANYL CITRATE (PF) 100 MCG/2ML IJ SOLN
INTRAMUSCULAR | Status: AC
Start: 2016-02-29 — End: 2016-03-01
  Filled 2016-02-29: qty 2

## 2016-02-29 MED ORDER — FENTANYL CITRATE (PF) 100 MCG/2ML IJ SOLN
25.0000 ug | INTRAMUSCULAR | Status: DC | PRN
  Administered 2016-02-29 (×2): 50 ug via INTRAVENOUS

## 2016-02-29 MED ORDER — LATANOPROST 0.005 % OP SOLN
1.0000 [drp] | OPHTHALMIC | Status: DC
  Filled 2016-02-29: qty 2.5

## 2016-02-29 MED ORDER — PROPOFOL 10 MG/ML IV BOLUS
INTRAVENOUS | Status: DC | PRN
  Administered 2016-02-29: 120 mg via INTRAVENOUS

## 2016-02-29 NOTE — Anesthesia Preprocedure Evaluation (Addendum)
Anesthesia Evaluation  Patient identified by MRN, date of birth, ID band Patient awake    Reviewed: Allergy & Precautions, NPO status , Patient's Chart, lab work & pertinent test results  Airway Mallampati: II  TM Distance: >3 FB Neck ROM: Full    Dental  (+) Dental Advisory Given, Upper Dentures, Lower Dentures   Pulmonary shortness of breath, asthma , COPD (3L O2 [place]),  COPD inhaler and oxygen dependent, former smoker,  Lung cancer   breath sounds clear to auscultation + decreased breath sounds      Cardiovascular hypertension, Pt. on medications (-) angina(-) Past MI Normal cardiovascular exam Rhythm:Regular Rate:Normal     Neuro/Psych CVA, No Residual Symptoms negative psych ROS   GI/Hepatic Neg liver ROS, GERD  ,  Endo/Other  diabetes, Well Controlled, Type 2, Oral Hypoglycemic Agents  Renal/GU RCC     Musculoskeletal negative musculoskeletal ROS (+)   Abdominal   Peds  Hematology  (+) Blood dyscrasia, anemia ,   Anesthesia Other Findings Day of surgery medications reviewed with the patient.  Reproductive/Obstetrics                           Anesthesia Physical Anesthesia Plan  ASA: III  Anesthesia Plan: General   Post-op Pain Management:    Induction: Intravenous  Airway Management Planned: Oral ETT  Additional Equipment:   Intra-op Plan:   Post-operative Plan: Possible Post-op intubation/ventilation  Informed Consent: I have reviewed the patients History and Physical, chart, labs and discussed the procedure including the risks, benefits and alternatives for the proposed anesthesia with the patient or authorized representative who has indicated his/her understanding and acceptance.   Dental advisory given  Plan Discussed with: CRNA  Anesthesia Plan Comments: (Risks/benefits of general anesthesia discussed with patient including risk of damage to teeth, lips, gum, and  tongue, nausea/vomiting, allergic reactions to medications, and the possibility of heart attack, stroke and death.  All patient questions answered.  Patient wishes to proceed.)       Anesthesia Quick Evaluation

## 2016-02-29 NOTE — Transfer of Care (Signed)
Immediate Anesthesia Transfer of Care Note  Patient: Craig [name].  Procedure(s) Performed: Procedure(s): RIGHT RENAL CRYOABLATION (Right)  Patient Location: PACU  Anesthesia Type:General  Level of Consciousness:  sedated, patient cooperative and responds to stimulation  Airway & Oxygen Therapy:Patient Spontanous Breathing and Patient connected to face mask oxgen  Post-op Assessment:  Report given to PACU RN and Post -op Vital signs reviewed and stable  Post vital signs:  Reviewed and stable  Last Vitals:  Filed Vitals:   02/29/16 1624 02/29/16 1630  BP: 142/80 142/80  Pulse: 79 80  Temp: 36.8 C   Resp: 20 18    Complications: No apparent anesthesia complications

## 2016-02-29 NOTE — H&P (Signed)
Referring Physician(s): [id number],OVFIE  Supervising Physician: Aletta Edouard  Patient Status: OP TBA  Chief Complaint:  Right renal cell carcinoma  Subjective: Patient familiar to IR service from prior CT-guided right lung mass biopsy in 2011 as well as right renal lesion biopsy in 2015. He was seen recently in consultation by Dr. Kathlene Cote on 12/29/15 for treatment options regarding the renal cell carcinoma. Due to oxygen dependency from COPD and other medical comorbidities urology felt that Mr. Craig [name] was a relatively poor surgical candidate for nephrectomy.He was deemed an appropriate candidate for CT-guided cryoablation of the enlarging right renal cell carcinoma and presents today for the above procedure. He initially presented for the procedure on 02/10/16 ,however due to absence of technical support procedure had to be rescheduled for today. Latest MRI of the abdomen on 01/17/16 revealed an approximately 4.7 cm right upper pole renal cell carcinoma as well as upper abdominal/retroperitoneal lymphadenopathy. 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 He currently denies fever, headache, chest pain, abdominal/back pain, nausea, vomiting or abnormal bleeding. He does have some occasional dyspnea with exertion and occasional cough but no hemoptysis. Additional history as below. Past Medical History  Diagnosis Date  . COPD (chronic obstructive pulmonary disease) ([place])   . Hypertension   . Diabetes mellitus without complication ([place])   . Respiratory failure ([place]) 07/12/2010    acute  . Glaucoma   . Right renal mass   . Supplemental oxygen dependent     USES 3 LITERS CONTINUOUSLY  . Shortness of breath dyspnea   . Asthma   . Stroke ([place]) 1990'S    NO RESIDUAL PROBLEMS  . GERD (gastroesophageal reflux disease)   . Difficulty sleeping   . Lung cancer ([place])     right  upper lobe mass positive for adenocarcinoma  . Cancer ([place])   . Renal cancer ([place])   . Lymphoma ([facility])     low grade  . Colon cancer ([place])   . Dysrhythmia   . History of bronchitis    History reviewed. No pertinent past surgical history.   Allergies: Iodine and Nexium  Medications: Prior to Admission medications   Medication Sig Start Date End Date Taking? Authorizing Provider  albuterol (PROVENTIL HFA;VENTOLIN HFA) 108 (90 BASE) MCG/ACT inhaler Inhale 2 puffs into the lungs every 6 (six) hours as needed for wheezing or shortness of breath.    Yes Historical Provider, MD  albuterol (PROVENTIL) (2.5 MG/3ML) 0.083% nebulizer solution USE 1 VIAL IN NEBULIZER EVERY 3-4 HOURS AS NEEDED Patient taking differently: USE 1 VIAL IN NEBULIZER EVERY 3-4 HOURS AS NEEDED FOR SHORTNESS OF BREATH OR WHEEZING. 07/05/15  Yes Forest Gleason, MD  budesonide-formoterol (SYMBICORT) 160-4.5 MCG/ACT inhaler Inhale 2 puffs into the lungs 2 (two) times daily.    Yes Historical Provider, MD  diphenhydrAMINE (BENADRYL) 12.5 MG chewable tablet Chew 25 mg by mouth daily as needed for allergies.    Yes Historical Provider, MD  glipiZIDE (GLUCOTROL) 10 MG tablet TAKE 1 TABLET BY MOUTH DAILY 08/16/15  Yes Forest Gleason, MD  HYDROcodone-acetaminophen (NORCO/VICODIN) 5-325 MG tablet Take 1-2 tablets by mouth every 6 (six) hours as needed for moderate pain. 02/22/16  Yes Forest Gleason, MD  latanoprost (XALATAN) 0.005 % ophthalmic solution Place 1 drop into both eyes every other day.  04/19/15  Yes Historical Provider, MD  loperamide (IMODIUM A-D) 2 MG tablet Take 2 mg by mouth  4 (four) times daily as needed for diarrhea or loose stools. Reported on 12/21/2015   Yes Historical Provider, MD  losartan-hydrochlorothiazide (HYZAAR) 50-12.5 MG per tablet Take 1 tablet by mouth daily.   Yes Historical Provider, MD  pioglitazone (ACTOS) 30 MG tablet Take 30 mg by mouth daily.   Yes Historical Provider, MD  predniSONE (DELTASONE) 10 MG tablet  Take 10 mg by mouth daily with breakfast.   Yes Historical Provider, MD  temazepam (RESTORIL) 7.5 MG capsule Take 7.5-15 capsules by mouth at bedtime as needed for sleep. Reported on 12/21/2015 05/02/15  Yes Historical Provider, MD  theophylline (UNIPHYL) 400 MG 24 hr tablet Take 400 mg by mouth daily as needed (For breathing.).  11/11/15  Yes Historical Provider, MD  tiotropium (SPIRIVA) 18 MCG inhalation capsule Place 18 mcg into inhaler and inhale daily.    Yes Historical Provider, MD     Vital Signs: BP 127/71 mmHg  Pulse 96  Temp(Src) 98.6 F (37 C) (Oral)  Resp 18  Ht '5\' 8"'$  (1.727 m)  Wt 152 lb (68.947 kg)  BMI 23.12 kg/m2  SpO2 100%  Physical Exam patient awake, alert. Chest with distant breath sounds bilaterally. Heart with regular rate and rhythm. Abdomen soft, positive bowel sounds, nontender. Extremities with full range of motion and no sig edema  Imaging: No results found.  Labs:  CBC:  Recent Labs  11/20/15 0649 12/07/15 1112 02/03/16 1153 02/29/16 1030  WBC 9.0 8.3 8.2 8.5  HGB 9.4* 11.1* 9.7* 9.5*  HCT 28.9* 34.7* 30.8* 29.2*  PLT 277 148* 255 159    COAGS:  Recent Labs  02/03/16 1153 02/29/16 1030  INR 1.06 0.98  APTT 30 30    BMP:  Recent Labs  11/19/15 1600 11/20/15 0649 12/07/15 1112 01/17/16 0929 02/03/16 1153  NA 137 138 137  --  145  K 5.3* 4.9 4.5  --  4.7  CL 95* 100* 95*  --  107  CO2 32 28 37*  --  35*  GLUCOSE 160* 183* 189*  --  155*  BUN 17 23* 22*  --  24*  CALCIUM 9.5 8.5* 8.9  --  9.2  CREATININE 1.21 1.30* 1.17 1.10 1.41*  GFRNONAA >60 56* >60  --  51*  GFRAA >60 >60 >60  --  59*    LIVER FUNCTION TESTS:  Recent Labs  10/26/15 1003 11/19/15 1600 11/20/15 0649 12/07/15 1112  BILITOT 0.4 0.3 0.4 0.4  AST '16 17 16 '$ 13*  ALT 12* 14* 13* 15*  ALKPHOS 74 81 64 58  PROT 7.6 8.0 7.1 6.7  ALBUMIN 4.4 3.9 3.4* 3.7    Assessment and Plan: Patient with remote history of right lung adenocarcinoma, CLL and now with  recently diagnosed and enlarging right renal cell carcinoma. Patient poor surgical candidate due to COPD and additional medical comorbidities. Seen recently in consultation by Dr. Kathlene Cote and deemed an appropriate candidate for percutaneous cryoablation of the right renal mass. He presents today for the above procedure. Details/risks of procedure, including but not limited to, internal bleeding, infection, contrast nephropathy, anesthesia-related complications, discussed with patient/sister with their understanding and consent. Postprocedure the patient will be admitted for overnight observation. Latest creat 1.3, GFR >60.  Electronically Signed: D. Rowe Robert 02/29/2016, 10:59 AM   I spent a total of 30 minutes at the the patient's bedside AND on the patient's hospital floor or unit, greater than 50% of which was counseling/coordinating care for CT guided right renal cell cancer cryoablation

## 2016-02-29 NOTE — Anesthesia Postprocedure Evaluation (Signed)
Anesthesia Post Note  Patient: Craig [name].  Procedure(s) Performed: Procedure(s) (LRB): RIGHT RENAL CRYOABLATION (Right)  Patient location during evaluation: PACU Anesthesia Type: General Level of consciousness: awake and alert Pain management: pain level controlled Vital Signs Assessment: post-procedure vital signs reviewed and stable Respiratory status: spontaneous breathing, nonlabored ventilation, respiratory function stable and patient connected to nasal cannula oxygen Cardiovascular status: blood pressure returned to baseline and stable Postop Assessment: no signs of nausea or vomiting Anesthetic complications: no    Last Vitals:  Filed Vitals:   02/29/16 1715 02/29/16 1729  BP: 112/71 112/71  Pulse: 77   Temp:  37 C  Resp: 20     Last Pain:  Filed Vitals:   02/29/16 1730  PainSc: 2                  [name],W. EDMOND

## 2016-02-29 NOTE — Addendum Note (Signed)
Addendum  created 02/29/16 1750 by West Pugh, CRNA   Modules edited: Anesthesia Flowsheet

## 2016-02-29 NOTE — Progress Notes (Signed)
Patient ID: Craig [name]., male   DOB: 07-Dec-1950, 65 y.o.   MRN: [id number]    Referring Physician(s): Bell  Supervising Physician: Aletta Edouard  Patient Status: In-pt  Chief Complaint:  Right renal cell cancer  Subjective:  Pt just arrived on floor from PACU; currently stable; has some mild rt flank soreness; denies CP, worsening dyspnea, N/V  Allergies: Iodine and Nexium  Medications: Prior to Admission medications   Medication Sig Start Date End Date Taking? Authorizing Provider  albuterol (PROVENTIL HFA;VENTOLIN HFA) 108 (90 BASE) MCG/ACT inhaler Inhale 2 puffs into the lungs every 6 (six) hours as needed for wheezing or shortness of breath.    Yes Historical Provider, MD  albuterol (PROVENTIL) (2.5 MG/3ML) 0.083% nebulizer solution USE 1 VIAL IN NEBULIZER EVERY 3-4 HOURS AS NEEDED Patient taking differently: USE 1 VIAL IN NEBULIZER EVERY 3-4 HOURS AS NEEDED FOR SHORTNESS OF BREATH OR WHEEZING. 07/05/15  Yes Forest Gleason, MD  budesonide-formoterol (SYMBICORT) 160-4.5 MCG/ACT inhaler Inhale 2 puffs into the lungs 2 (two) times daily.    Yes Historical Provider, MD  diphenhydrAMINE (BENADRYL) 12.5 MG chewable tablet Chew 25 mg by mouth daily as needed for allergies.    Yes Historical Provider, MD  glipiZIDE (GLUCOTROL) 10 MG tablet TAKE 1 TABLET BY MOUTH DAILY 08/16/15  Yes Forest Gleason, MD  HYDROcodone-acetaminophen (NORCO/VICODIN) 5-325 MG tablet Take 1-2 tablets by mouth every 6 (six) hours as needed for moderate pain. 02/22/16  Yes Forest Gleason, MD  latanoprost (XALATAN) 0.005 % ophthalmic solution Place 1 drop into both eyes every other day.  04/19/15  Yes Historical Provider, MD  loperamide (IMODIUM A-D) 2 MG tablet Take 2 mg by mouth 4 (four) times daily as needed for diarrhea or loose stools. Reported on 12/21/2015   Yes Historical Provider, MD  losartan-hydrochlorothiazide (HYZAAR) 50-12.5 MG per tablet Take 1 tablet by mouth daily.   Yes Historical Provider, MD    pioglitazone (ACTOS) 30 MG tablet Take 30 mg by mouth daily.   Yes Historical Provider, MD  predniSONE (DELTASONE) 10 MG tablet Take 10 mg by mouth daily with breakfast.   Yes Historical Provider, MD  temazepam (RESTORIL) 7.5 MG capsule Take 7.5-15 capsules by mouth at bedtime as needed for sleep. Reported on 12/21/2015 05/02/15  Yes Historical Provider, MD  theophylline (UNIPHYL) 400 MG 24 hr tablet Take 400 mg by mouth daily as needed (For breathing.).  11/11/15  Yes Historical Provider, MD  tiotropium (SPIRIVA) 18 MCG inhalation capsule Place 18 mcg into inhaler and inhale daily.    Yes Historical Provider, MD     Vital Signs: BP 112/71 mmHg  Pulse 77  Temp(Src) 98.6 F (37 C) (Oral)  Resp 20  Ht '5\' 8"'$  (1.727 m)  Wt 152 lb (68.947 kg)  BMI 23.12 kg/m2  SpO2 100%  Physical Exam  Awake/alert; puncture site rt flank with clean, intact gauze dressing, mildly tender; abd soft,ND/NT; foley draining yellow urine Imaging: No results found.  Labs:  CBC:  Recent Labs  11/20/15 0649 12/07/15 1112 02/03/16 1153 02/29/16 1030  WBC 9.0 8.3 8.2 8.5  HGB 9.4* 11.1* 9.7* 9.5*  HCT 28.9* 34.7* 30.8* 29.2*  PLT 277 148* 255 159    COAGS:  Recent Labs  02/03/16 1153 02/29/16 1030  INR 1.06 0.98  APTT 30 30    BMP:  Recent Labs  11/20/15 0649 12/07/15 1112 01/17/16 0929 02/03/16 1153 02/29/16 1030  NA 138 137  --  145 137  K 4.9 4.5  --  4.7 4.7  CL 100* 95*  --  107 98*  CO2 28 37*  --  35* 33*  GLUCOSE 183* 189*  --  155* 150*  BUN 23* 22*  --  24* 26*  CALCIUM 8.5* 8.9  --  9.2 8.9  CREATININE 1.30* 1.17 1.10 1.41* 1.30*  GFRNONAA 56* >60  --  51* 56*  GFRAA >60 >60  --  59* >60    LIVER FUNCTION TESTS:  Recent Labs  10/26/15 1003 11/19/15 1600 11/20/15 0649 12/07/15 1112  BILITOT 0.4 0.3 0.4 0.4  AST '16 17 16 '$ 13*  ALT 12* 14* 13* 15*  ALKPHOS 74 81 64 58  PROT 7.6 8.0 7.1 6.7  ALBUMIN 4.4 3.9 3.4* 3.7    Assessment and Plan: S/p CT guided  percutaneous cryoablation of right renal cell carcinoma 5/17; for overnight obs; check am labs; f/u with Dr. Kathlene Cote in [facility] in 4 weeks   Electronically Signed: D. Rowe Robert 02/29/2016, 5:37 PM   I spent a total of 15 minutes at the the patient's bedside AND on the patient's hospital floor or unit, greater than 50% of which was counseling/coordinating care for CT guided cryoablation of right renal cell cancer

## 2016-02-29 NOTE — Procedures (Signed)
Interventional Radiology Procedure Note  Procedure:  CT guided cryoablation of right renal carcinoma  Anesthesia:  General   Complications:  None  Estimated Blood Loss: 25-50 mL  Right renal mass localized by [place] and CT.  4 separate Galil Ice Rod CX probes placed in tumor.  Cryoablation performed.  Plan:  PACU recovery followed by overnight observation.  Venetia Night. Kathlene Cote, M.D Pager:  [phone]

## 2016-02-29 NOTE — Anesthesia Procedure Notes (Signed)
Procedure Name: Intubation Date/Time: 02/29/2016 12:57 PM Performed by: West Pugh Pre-anesthesia Checklist: Patient identified, Emergency Drugs available, Suction available, Patient being monitored and Timeout performed Patient Re-evaluated:Patient Re-evaluated prior to inductionOxygen Delivery Method: Circle system utilized Preoxygenation: Pre-oxygenation with 100% oxygen Intubation Type: IV induction Ventilation: Mask ventilation without difficulty Laryngoscope Size: Mac and 4 Grade View: Grade I Tube type: Oral Tube size: 7.5 mm Number of attempts: 1 Airway Equipment and Method: Stylet Placement Confirmation: ETT inserted through vocal cords under direct vision,  positive ETCO2,  CO2 detector and breath sounds checked- equal and bilateral Secured at: 22 cm Tube secured with: Tape Dental Injury: Teeth and Oropharynx as per pre-operative assessment

## 2016-03-01 ENCOUNTER — Other Ambulatory Visit: Payer: Self-pay | Admitting: *Deleted

## 2016-03-01 DIAGNOSIS — C641 Malignant neoplasm of right kidney, except renal pelvis: Secondary | ICD-10-CM | POA: Diagnosis not present

## 2016-03-01 DIAGNOSIS — I10 Essential (primary) hypertension: Secondary | ICD-10-CM | POA: Diagnosis not present

## 2016-03-01 DIAGNOSIS — Z85118 Personal history of other malignant neoplasm of bronchus and lung: Secondary | ICD-10-CM | POA: Diagnosis not present

## 2016-03-01 DIAGNOSIS — C3490 Malignant neoplasm of unspecified part of unspecified bronchus or lung: Secondary | ICD-10-CM | POA: Diagnosis not present

## 2016-03-01 DIAGNOSIS — Z7984 Long term (current) use of oral hypoglycemic drugs: Secondary | ICD-10-CM | POA: Diagnosis not present

## 2016-03-01 DIAGNOSIS — N2889 Other specified disorders of kidney and ureter: Secondary | ICD-10-CM

## 2016-03-01 DIAGNOSIS — Z8673 Personal history of transient ischemic attack (TIA), and cerebral infarction without residual deficits: Secondary | ICD-10-CM | POA: Diagnosis not present

## 2016-03-01 DIAGNOSIS — J449 Chronic obstructive pulmonary disease, unspecified: Secondary | ICD-10-CM | POA: Diagnosis not present

## 2016-03-01 DIAGNOSIS — E119 Type 2 diabetes mellitus without complications: Secondary | ICD-10-CM | POA: Diagnosis not present

## 2016-03-01 DIAGNOSIS — Z85038 Personal history of other malignant neoplasm of large intestine: Secondary | ICD-10-CM | POA: Diagnosis not present

## 2016-03-01 LAB — BASIC METABOLIC PANEL
Anion gap: 6 (ref 5–15)
BUN: 28 mg/dL — AB (ref 6–20)
CHLORIDE: 100 mmol/L — AB (ref 101–111)
CO2: 33 mmol/L — AB (ref 22–32)
CREATININE: 1.38 mg/dL — AB (ref 0.61–1.24)
Calcium: 8.6 mg/dL — ABNORMAL LOW (ref 8.9–10.3)
GFR calc Af Amer: >60 mL/min (ref >60)
GFR calc non Af Amer: 52 mL/min — ABNORMAL LOW (ref >60)
Glucose, Bld: 131 mg/dL — ABNORMAL HIGH (ref 65–99)
POTASSIUM: 4.8 mmol/L (ref 3.5–5.1)
SODIUM: 139 mmol/L (ref 135–145)

## 2016-03-01 LAB — CBC
HEMATOCRIT: 29.2 % — AB (ref 39.0–52.0)
HEMOGLOBIN: 9.4 g/dL — AB (ref 13.0–17.0)
MCH: 28.1 pg (ref 26.0–34.0)
MCHC: 32.2 g/dL (ref 30.0–36.0)
MCV: 87.4 fL (ref 78.0–100.0)
Platelets: 173 10*3/uL (ref 150–400)
RBC: 3.34 MIL/uL — AB (ref 4.22–5.81)
RDW: 14.4 % (ref 11.5–15.5)
WBC: 13.9 10*3/uL — ABNORMAL HIGH (ref 4.0–10.5)

## 2016-03-01 MED ORDER — DOCUSATE SODIUM 100 MG PO CAPS: 100.0000 mg | ORAL_CAPSULE | Freq: Two times a day (BID) | ORAL | Status: DC

## 2016-03-01 MED ORDER — HYDROCODONE-ACETAMINOPHEN 5-325 MG PO TABS: 1.0000 | ORAL_TABLET | ORAL | Status: DC | PRN

## 2016-03-01 MED ORDER — CETYLPYRIDINIUM CHLORIDE 0.05 % MT LIQD: 7.0000 mL | Freq: Two times a day (BID) | OROMUCOSAL | Status: DC

## 2016-03-01 NOTE — Discharge Instructions (Signed)
Cryoablation, Care After Refer to this sheet in the next few weeks. These instructions provide you with information on caring for yourself after your procedure. Your health care provider may also give you more specific instructions. Your treatment has been planned according to current medical practices, but problems sometimes occur. Call your health care provider if you have any problems or questions after your procedure.  WHAT TO EXPECT AFTER THE PROCEDURE After your procedure, it is typical to have the following:  Soreness around your puncture sites for 3-5 days. Some minor bruising may also be present. You will be given pain medicines to control the pain.  Mild abdominal, flank, or right shoulder pain for 24 hours. HOME CARE INSTRUCTIONS  Only take over-the-counter or prescription medicines as directed by your health care provider. Take all medicines exactly as directed.  Follow any prescribed diet.  Follow your health care provider's instructions regarding rest and physical activity. You should be able to go back to your normal level of activity within several days of the procedure. SEEK MEDICAL CARE IF:  You cannot pass gas.  You are not able to have a bowel movement within 2 days.  You have sickness in your stomach (nausea) or vomiting.  You have redness or drainage from any of your puncture sites. SEEK IMMEDIATE MEDICAL CARE IF:  You have severe or lasting abdominal pain or pain in your shoulder or back.   You have a fever.   You have a skin rash.   You have trouble swallowing or breathing.   You have severe weakness or dizziness.  You have chest pain or shortness of breath.    This information is not intended to replace advice given to you by your health care provider. Make sure you discuss any questions you have with your health care provider.   Document Released: 07/22/2013 Document Reviewed: 07/22/2013 Elsevier Interactive Patient Education [employer].

## 2016-03-01 NOTE — Discharge Summary (Signed)
Patient ID: Craig [name]. MRN: [id number] DOB/AGE: 05-27-1951 65 y.o.  Admit date: 02/29/2016 Discharge date: 03/01/2016  Supervising Physician: Sandi Mariscal  Admission Diagnoses: right renal carcinoma  Discharge Diagnoses:  Active Problems:   Renal carcinoma [facility])   Discharged Condition: good  Hospital Course: the patient was admitted and underwent a right renal cryoablation.  He tolerated this procedure well.  On POD 1, he was tolerating a regular diet with minimal pain and no nausea.  He is voiding well on his own.  He was otherwise felt stable for dc home.  Consults: None  Treatments: IV hydration and right renal cryoablation  Discharge Exam: Blood pressure 119/75, pulse 89, temperature 98.2 F (36.8 C), temperature source Oral, resp. rate 20, height '5\' 9"'$  (1.753 m), weight 152 lb (68.947 kg), SpO2 100 %. General appearance: alert and cooperative Resp: clear to auscultation bilaterally Cardio: regular rate and rhythm Skin: right flank incision is c/d/i with dry bandage in place.  minimal tenderness around this site.  Disposition: 01-Home or Self Care     Medication List    TAKE these medications        albuterol 108 (90 Base) MCG/ACT inhaler  Commonly known as:  PROVENTIL HFA;VENTOLIN HFA  Inhale 2 puffs into the lungs every 6 (six) hours as needed for wheezing or shortness of breath.     albuterol (2.5 MG/3ML) 0.083% nebulizer solution  Commonly known as:  PROVENTIL  USE 1 VIAL IN NEBULIZER EVERY 3-4 HOURS AS NEEDED     budesonide-formoterol 160-4.5 MCG/ACT inhaler  Commonly known as:  SYMBICORT  Inhale 2 puffs into the lungs 2 (two) times daily.     diphenhydrAMINE 12.5 MG chewable tablet  Commonly known as:  BENADRYL  Chew 25 mg by mouth daily as needed for allergies.     docusate sodium 100 MG capsule  Commonly known as:  COLACE  Take 1 capsule (100 mg total) by mouth 2 (two) times daily.     glipiZIDE 10 MG tablet  Commonly known as:   GLUCOTROL  TAKE 1 TABLET BY MOUTH DAILY     HYDROcodone-acetaminophen 5-325 MG tablet  Commonly known as:  NORCO/VICODIN  Take 1-2 tablets by mouth every 6 (six) hours as needed for moderate pain.     HYDROcodone-acetaminophen 5-325 MG tablet  Commonly known as:  NORCO/VICODIN  Take 1-2 tablets by mouth every 4 (four) hours as needed for moderate pain.     latanoprost 0.005 % ophthalmic solution  Commonly known as:  XALATAN  Place 1 drop into both eyes every other day.     loperamide 2 MG tablet  Commonly known as:  IMODIUM A-D  Take 2 mg by mouth 4 (four) times daily as needed for diarrhea or loose stools. Reported on 12/21/2015     losartan-hydrochlorothiazide 50-12.5 MG tablet  Commonly known as:  HYZAAR  Take 1 tablet by mouth daily.     pioglitazone 30 MG tablet  Commonly known as:  ACTOS  Take 30 mg by mouth daily.     predniSONE 10 MG tablet  Commonly known as:  DELTASONE  Take 10 mg by mouth daily with breakfast.     temazepam 7.5 MG capsule  Commonly known as:  RESTORIL  Take 7.5-15 mg by mouth at bedtime as needed for sleep. Reported on 12/21/2015     theophylline 400 MG 24 hr tablet  Commonly known as:  UNIPHYL  Take 400 mg by mouth daily as needed (For breathing.).  tiotropium 18 MCG inhalation capsule  Commonly known as:  SPIRIVA  Place 18 mcg into inhaler and inhale daily.           Follow-up Information    Follow up with [facility] T, MD In 1 month.   Specialty:  Interventional Radiology   Why:  our office will call you   Contact information:   [address] [place] [address] [phone]        Electronically Signed: Henreitta Cea 03/01/2016, 9:10 AM   I have spent Less Than 30 Minutes discharging Craig [name].Marland Kitchen

## 2016-03-01 NOTE — Progress Notes (Signed)
DC instructions reviewed with the pt. All questions and concerns were addressed. Pt to follow up in 4 weeks- office to call with his appointment. Pt is alert and oriented times four, VSS, no c/o pain, DSG CDI, all other skin intact. No apparent distress or discomfort at this time. Family to tx home.

## 2016-03-05 ENCOUNTER — Other Ambulatory Visit: Payer: Self-pay | Admitting: Oncology

## 2016-03-05 ENCOUNTER — Ambulatory Visit: Payer: Federal, State, Local not specified - PPO | Admitting: Oncology

## 2016-03-05 ENCOUNTER — Other Ambulatory Visit: Payer: Federal, State, Local not specified - PPO

## 2016-03-07 ENCOUNTER — Encounter: Payer: Self-pay | Admitting: Oncology

## 2016-03-07 ENCOUNTER — Inpatient Hospital Stay: Payer: Commercial Managed Care - HMO

## 2016-03-07 ENCOUNTER — Inpatient Hospital Stay: Payer: Commercial Managed Care - HMO | Attending: Oncology

## 2016-03-07 ENCOUNTER — Inpatient Hospital Stay (HOSPITAL_BASED_OUTPATIENT_CLINIC_OR_DEPARTMENT_OTHER): Payer: Commercial Managed Care - HMO | Admitting: Oncology

## 2016-03-07 VITALS — BP 129/85 | HR 114 | Temp 96.7°F | Resp 18 | Wt 157.0 lb

## 2016-03-07 DIAGNOSIS — Z87891 Personal history of nicotine dependence: Secondary | ICD-10-CM | POA: Insufficient documentation

## 2016-03-07 DIAGNOSIS — J45909 Unspecified asthma, uncomplicated: Secondary | ICD-10-CM | POA: Diagnosis not present

## 2016-03-07 DIAGNOSIS — K219 Gastro-esophageal reflux disease without esophagitis: Secondary | ICD-10-CM | POA: Diagnosis not present

## 2016-03-07 DIAGNOSIS — C9110 Chronic lymphocytic leukemia of B-cell type not having achieved remission: Secondary | ICD-10-CM

## 2016-03-07 DIAGNOSIS — Z85038 Personal history of other malignant neoplasm of large intestine: Secondary | ICD-10-CM

## 2016-03-07 DIAGNOSIS — J449 Chronic obstructive pulmonary disease, unspecified: Secondary | ICD-10-CM | POA: Insufficient documentation

## 2016-03-07 DIAGNOSIS — C3410 Malignant neoplasm of upper lobe, unspecified bronchus or lung: Secondary | ICD-10-CM

## 2016-03-07 DIAGNOSIS — Z7984 Long term (current) use of oral hypoglycemic drugs: Secondary | ICD-10-CM

## 2016-03-07 DIAGNOSIS — Z8673 Personal history of transient ischemic attack (TIA), and cerebral infarction without residual deficits: Secondary | ICD-10-CM

## 2016-03-07 DIAGNOSIS — I10 Essential (primary) hypertension: Secondary | ICD-10-CM

## 2016-03-07 DIAGNOSIS — C649 Malignant neoplasm of unspecified kidney, except renal pelvis: Secondary | ICD-10-CM | POA: Insufficient documentation

## 2016-03-07 DIAGNOSIS — Z79899 Other long term (current) drug therapy: Secondary | ICD-10-CM

## 2016-03-07 DIAGNOSIS — Z9981 Dependence on supplemental oxygen: Secondary | ICD-10-CM | POA: Insufficient documentation

## 2016-03-07 DIAGNOSIS — C3411 Malignant neoplasm of upper lobe, right bronchus or lung: Secondary | ICD-10-CM | POA: Insufficient documentation

## 2016-03-07 DIAGNOSIS — E119 Type 2 diabetes mellitus without complications: Secondary | ICD-10-CM | POA: Diagnosis not present

## 2016-03-07 DIAGNOSIS — Z9221 Personal history of antineoplastic chemotherapy: Secondary | ICD-10-CM | POA: Insufficient documentation

## 2016-03-07 DIAGNOSIS — C3492 Malignant neoplasm of unspecified part of left bronchus or lung: Secondary | ICD-10-CM

## 2016-03-07 DIAGNOSIS — C641 Malignant neoplasm of right kidney, except renal pelvis: Secondary | ICD-10-CM

## 2016-03-07 LAB — COMPREHENSIVE METABOLIC PANEL
ALT: 13 U/L — ABNORMAL LOW (ref 17–63)
ANION GAP: 7 (ref 5–15)
AST: 15 U/L (ref 15–41)
Albumin: 3.6 g/dL (ref 3.5–5.0)
Alkaline Phosphatase: 62 U/L (ref 38–126)
BUN: 25 mg/dL — AB (ref 6–20)
CHLORIDE: 100 mmol/L — AB (ref 101–111)
CO2: 34 mmol/L — ABNORMAL HIGH (ref 22–32)
Calcium: 9 mg/dL (ref 8.9–10.3)
Creatinine, Ser: 1.31 mg/dL — ABNORMAL HIGH (ref 0.61–1.24)
GFR, EST NON AFRICAN AMERICAN: 56 mL/min — AB (ref >60)
Glucose, Bld: 197 mg/dL — ABNORMAL HIGH (ref 65–99)
POTASSIUM: 3.5 mmol/L (ref 3.5–5.1)
Sodium: 141 mmol/L (ref 135–145)
Total Bilirubin: 0.2 mg/dL — ABNORMAL LOW (ref 0.3–1.2)
Total Protein: 6.9 g/dL (ref 6.5–8.1)

## 2016-03-07 LAB — CBC WITH DIFFERENTIAL/PLATELET
BASOS ABS: 0 10*3/uL (ref 0–0.1)
Basophils Relative: 0 %
EOS PCT: 1 %
Eosinophils Absolute: 0.1 10*3/uL (ref 0–0.7)
HCT: 27.8 % — ABNORMAL LOW (ref 40.0–52.0)
Hemoglobin: 9.1 g/dL — ABNORMAL LOW (ref 13.0–18.0)
LYMPHS PCT: 36 %
Lymphs Abs: 4.2 10*3/uL — ABNORMAL HIGH (ref 1.0–3.6)
MCH: 28.3 pg (ref 26.0–34.0)
MCHC: 32.5 g/dL (ref 32.0–36.0)
MCV: 86.9 fL (ref 80.0–100.0)
MONO ABS: 0.7 10*3/uL (ref 0.2–1.0)
Monocytes Relative: 6 %
Neutro Abs: 6.6 10*3/uL — ABNORMAL HIGH (ref 1.4–6.5)
Neutrophils Relative %: 57 %
PLATELETS: 260 10*3/uL (ref 150–440)
RBC: 3.2 MIL/uL — ABNORMAL LOW (ref 4.40–5.90)
RDW: 14.9 % — AB (ref 11.5–14.5)
WBC: 11.7 10*3/uL — ABNORMAL HIGH (ref 3.8–10.6)

## 2016-03-07 MED ORDER — SODIUM CHLORIDE 0.9% FLUSH
10.0000 mL | Freq: Once | INTRAVENOUS | Status: AC
  Administered 2016-03-07: 10 mL via INTRAVENOUS
  Filled 2016-03-07: qty 10

## 2016-03-07 MED ORDER — HEPARIN SOD (PORK) LOCK FLUSH 100 UNIT/ML IV SOLN
500.0000 [IU] | Freq: Once | INTRAVENOUS | Status: AC
  Administered 2016-03-07: 500 [IU] via INTRAVENOUS
  Filled 2016-03-07: qty 5

## 2016-03-10 ENCOUNTER — Encounter: Payer: Self-pay | Admitting: Oncology

## 2016-03-10 NOTE — Progress Notes (Signed)
Informed that prescription is ready to pick up  [place] @ [facility] [phone]  [phone] [place]. OB: 1951/09/05  MR#: [id number]  IHK#:[id number]  Patient Care Team: [name], MD as PCP - General (Family Medicine)  CHIEF COMPLAINT:  Chief Complaint  Patient presents with  . Lung Cancer      1. Right upper lobe lung mass biopsies positive for adenocarcinoma.   Based on PET scan patient has T2, N2, M0 tumor stage III a 2. Poor pulmonary function 3. Patient was started on carboplatinum Alimta and Avastin   on april  9th of 2013 4. Patient had an allergic reaction to carboplatinum with acute bronchospasm in May of 2013.  Patient is now being taken off carboplatinum. 5. Started on maintenance Alimta and Avastin  from June of 2013 hold chemotherapy because of increasing shortness of breath (in July, 2013) 6.VARISTRAT test is good started on Tarceva February 3 about 2014 diarrhea 4 days after Tarceva was started.  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 9.bnormal CT and MRI scan of the right kidneywith solid massin the upper pole right kidney 10.biopsy of renal masses consistent with primary renal cell cancer 11.  Patient started on  [facility] August 09, 2014 12 NIVOLULAMAB has been put on hold from August of 2016 because of   PROGRESSIVE  respiratory difficulty        13.Patient had radiofrequency ablation of kidney mass (May of 2017)   INTERVAL HISTORY:   65 year old African-American gentleman came today further follow-up patient has multiple medical illnesses including lung cancer.  History of renal cancer.  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  Doing relatively stable Patient went to emergency room for evaluation with increased  shortness of breath and cough and was hospitalized for one day for IV for IV antibiotics Patient recently had a PET scan done which has been evaluated Patient had acute bronchitis from which patient has recovered.  Had a recent radiofrequency ablation of the kidney mass.  I do not have pathology report available was done at [facility] Here for further follow-up and treatment consideration     REVIEW OF SYSTEMS:   Gen. status: Patient's performance status is stable in wheelchair Lungs: Increasing shortness of breath.  Cough.    No chills or fever Cardiac: No chest pain or paroxysmal nocturnal dyspnea Abdomen: No nausea no vomiting no diarrhea GU: No hematuria dysuria Lower extremity no swelling Skin: No rash Neurological system no headache no dizziness Pain has improved taking Vicodin  As per HPI. Otherwise, a complete review of systems is negatve.  PAST MEDICAL HISTORY: Past Medical History  Diagnosis Date  . COPD (chronic obstructive pulmonary disease) ([place])   . Hypertension   . Diabetes mellitus without complication ([place])   . Respiratory failure ([place]) 07/12/2010    acute  . Glaucoma   . Right renal mass   . Supplemental oxygen dependent     USES 3 LITERS CONTINUOUSLY  . Shortness of breath dyspnea   . Asthma   . Stroke ([place]) 1990'S    NO RESIDUAL PROBLEMS  . GERD (gastroesophageal reflux disease)   . Difficulty sleeping   . Lung cancer ([place])     right upper lobe mass positive for adenocarcinoma  . Cancer ([place])   .  Renal cancer ([place])   . Lymphoma ([facility])     low grade  . Colon cancer ([place])   . Dysrhythmia   . History of bronchitis     PAST SURGICAL HIST0RY   COPD:    acute resp failure: 12-Jul-2010   lung ca:    CVA/Stroke:    HTN:    Diabetes Mellitus, Type II ([facility]):   Smoking History: Smoking History 3(1)quit Sept. 2011(1)  PFSH: Comments: history of colon cancer, doubt, diabetes, tuberculosis  Comments: has been a chronic smoker for more than 25  years pack a day history does not drink.  Works in the post [employer]  Additional Past Medical and Surgical History: diabetes, type II,.  Hypertensin, history of stroke, chronic obstructive pulmonary disease,.  glaucoma.  History of pancreatitis and cholecystitis       ADVANCED DIRECTIVES: Patient does have advanced care directive   HEALTH MAINTENANCE: Social History  Substance Use Topics  . Smoking status: Former Smoker -- 1.00 packs/day for 30 years    Types: Cigarettes    Start date: 12/28/1968    Quit date: 01/13/2010  . Smokeless tobacco: Never Used  . Alcohol Use: No     Comment: Stopped 2001       Allergies  Allergen Reactions  . Iodine Shortness Of Breath, Diarrhea and Nausea And Vomiting  . Nexium [Esomeprazole Magnesium] Other (See Comments)    headache    Current Outpatient Prescriptions  Medication Sig Dispense Refill  . albuterol (PROVENTIL HFA;VENTOLIN HFA) 108 (90 BASE) MCG/ACT inhaler Inhale 2 puffs into the lungs every 6 (six) hours as needed for wheezing or shortness of breath.     Marland Kitchen albuterol (PROVENTIL) (2.5 MG/3ML) 0.083% nebulizer solution USE 1 VIAL IN NEBULIZER EVERY 3-4 HOURS AS NEEDED (Patient taking differently: USE 1 VIAL IN NEBULIZER EVERY 3-4 HOURS AS NEEDED FOR SHORTNESS OF BREATH OR WHEEZING.) 75 mL 3  . budesonide-formoterol (SYMBICORT) 160-4.5 MCG/ACT inhaler Inhale 2 puffs into the lungs 2 (two) times daily.     . diphenhydrAMINE (BENADRYL) 12.5 MG chewable tablet Chew 25 mg by mouth daily as needed for allergies.     Marland Kitchen docusate sodium (COLACE) 100 MG capsule Take 1 capsule (100 mg total) by mouth 2 (two) times daily. 10 capsule 0  . glipiZIDE (GLUCOTROL) 10 MG tablet TAKE 1 TABLET BY MOUTH DAILY 30 tablet 6  . HYDROcodone-acetaminophen (NORCO/VICODIN) 5-325 MG tablet Take 1-2 tablets by mouth every 6 (six) hours as needed for moderate pain. 90 tablet 0  . HYDROcodone-acetaminophen (NORCO/VICODIN) 5-325 MG tablet Take 1-2 tablets by mouth every 4  (four) hours as needed for moderate pain. 20 tablet 0  . latanoprost (XALATAN) 0.005 % ophthalmic solution Place 1 drop into both eyes every other day.     . loperamide (IMODIUM A-D) 2 MG tablet Take 2 mg by mouth 4 (four) times daily as needed for diarrhea or loose stools. Reported on 12/21/2015    . losartan-hydrochlorothiazide (HYZAAR) 50-12.5 MG per tablet Take 1 tablet by mouth daily.    . pioglitazone (ACTOS) 30 MG tablet Take 30 mg by mouth daily.    . predniSONE (DELTASONE) 10 MG tablet Take 10 mg by mouth daily with breakfast.    . predniSONE (DELTASONE) 10 MG tablet TAKE 1 TABLET (10 MG TOTAL) BY MOUTH DAILY WITH BREAKFAST. 30 tablet 3  . temazepam (RESTORIL) 7.5 MG capsule Take 7.5-15 mg by mouth at bedtime as needed for sleep. Reported on 12/21/2015  2  . theophylline (UNIPHYL)  400 MG 24 hr tablet Take 400 mg by mouth daily as needed (For breathing.).     Marland Kitchen tiotropium (SPIRIVA) 18 MCG inhalation capsule Place 18 mcg into inhaler and inhale daily.      No current facility-administered medications for this visit.   Facility-Administered Medications Ordered in Other Visits  Medication Dose Route Frequency Provider Last Rate Last Dose  . sodium chloride 0.9 % injection 10 mL  10 mL Intracatheter PRN Forest Gleason, MD   10 mL at 04/28/15 1410  . sodium chloride 0.9 % injection 10 mL  10 mL Intravenous PRN Forest Gleason, MD   10 mL at 05/12/15 1350    OBJECTIVE:  Filed Vitals:   03/07/16 1542  BP: 129/85  Pulse: 114  Temp: 96.7 F (35.9 C)  Resp: 18     Body mass index is 23.17 kg/(m^2).    ECOG FS:1 - Symptomatic but completely ambulatory  PHYSICAL EXAM:  Gen. status: Patient is alert oriented not any acute distress using oxygen. HEENT: No evidence of stomatitis. Lymphatic system: No palpable supraclavicular cervical axillary lymphadenopathy Lungs: Diminished at [facility] both sides O rhonchi coarse crepitation Bilateral rhonchi Cardiac: Soft systolic murmur.   Tachycardia Abdominal exam revealed normal bowel sounds. The abdomen was soft, non-tender, and without masses, organomegaly, or appreciable enlargement of the abdominal aorta. Skin: No ecchymosis no rash Neurological system no localizing sign All other systems has been examined no abnormality detected  LAB RESULTS:  Infusion on 03/07/2016  Component Date Value Ref Range Status  . WBC 03/07/2016 11.7* 3.8 - 10.6 K/uL Final  . RBC 03/07/2016 3.20* 4.40 - 5.90 MIL/uL Final  . Hemoglobin 03/07/2016 9.1* 13.0 - 18.0 g/dL Final  . HCT 03/07/2016 27.8* 40.0 - 52.0 % Final  . MCV 03/07/2016 86.9  80.0 - 100.0 fL Final  . MCH 03/07/2016 28.3  26.0 - 34.0 pg Final  . MCHC 03/07/2016 32.5  32.0 - 36.0 g/dL Final  . RDW 03/07/2016 14.9* 11.5 - 14.5 % Final  . Platelets 03/07/2016 260  150 - 440 K/uL Final  . Neutrophils Relative % 03/07/2016 57   Final  . Neutro Abs 03/07/2016 6.6* 1.4 - 6.5 K/uL Final  . Lymphocytes Relative 03/07/2016 36   Final  . Lymphs Abs 03/07/2016 4.2* 1.0 - 3.6 K/uL Final  . Monocytes Relative 03/07/2016 6   Final  . Monocytes Absolute 03/07/2016 0.7  0.2 - 1.0 K/uL Final  . Eosinophils Relative 03/07/2016 1   Final  . Eosinophils Absolute 03/07/2016 0.1  0 - 0.7 K/uL Final  . Basophils Relative 03/07/2016 0   Final  . Basophils Absolute 03/07/2016 0.0  0 - 0.1 K/uL Final  . Sodium 03/07/2016 141  135 - 145 mmol/L Final  . Potassium 03/07/2016 3.5  3.5 - 5.1 mmol/L Final  . Chloride 03/07/2016 100* 101 - 111 mmol/L Final  . CO2 03/07/2016 34* 22 - 32 mmol/L Final  . Glucose, Bld 03/07/2016 197* 65 - 99 mg/dL Final  . BUN 03/07/2016 25* 6 - 20 mg/dL Final  . Creatinine, Ser 03/07/2016 1.31* 0.61 - 1.24 mg/dL Final  . Calcium 03/07/2016 9.0  8.9 - 10.3 mg/dL Final  . Total Protein 03/07/2016 6.9  6.5 - 8.1 g/dL Final  . Albumin 03/07/2016 3.6  3.5 - 5.0 g/dL Final  . AST 03/07/2016 15  15 - 41 U/L Final  . ALT 03/07/2016 13* 17 - 63 U/L Final  . Alkaline  Phosphatase 03/07/2016 62  38 - 126 U/L  Final  . Total Bilirubin 03/07/2016 0.2* 0.3 - 1.2 mg/dL Final  . GFR calc non Af Amer 03/07/2016 56* >60 mL/min Final  . GFR calc Af Amer 03/07/2016 >60  >60 mL/min Final   Comment: (NOTE) The eGFR has been calculated using the CKD EPI equation. This calculation has not been validated in all clinical situations. eGFR's persistently <60 mL/min signify possible Chronic Kidney Disease.   . Anion gap 03/07/2016 7  5 - 15 Final      STUDIES: Dg Chest 1 View  02/10/2016  CLINICAL DATA:  Preoperative for CT guided right renal mass ablation EXAM: CHEST 1 VIEW COMPARISON:  11/19/2015 chest radiograph. FINDINGS: Left subclavian Port-A-Cath terminates in the middle third of the superior vena cava. Stable cardiomediastinal silhouette with normal heart size. No pneumothorax. No pleural effusion. Hyperinflated lungs. No pulmonary edema. No acute consolidative airspace disease. Stable mild platelike scarring in the right lung apex. Stable healed deformities in the bilateral ribs. Hyperinflated lungs. IMPRESSION: No acute cardiopulmonary disease. Stable hyperinflated lungs and apical right upper lung scarring. Electronically Signed   By: Ilona Sorrel M.D.   On: 02/10/2016 10:27   Ct Abd Limited W/o Cm  02/10/2016  CLINICAL DATA:  Right renal cell carcinoma and planned cryoablation. EXAM: CT ABDOMEN WITHOUT CONTRAST LIMITED TECHNIQUE: Multidetector CT imaging of the abdomen was performed following the standard protocol without IV contrast. COMPARISON:  MRI of the abdomen on 01/17/2016. FINDINGS: Unenhanced imaging was obtained through the upper to mid abdomen with the patient in a prone position in preparation for cryoablation of a known right renal carcinoma. The procedure had to be aborted due to lack of transportation of the cryoablation unit at the scheduled time of the procedure. The procedure has been rescheduled. IMPRESSION: Unenhanced imaging was obtained prior to  planned percutaneous cryoablation of a known right renal carcinoma. The procedure had to be canceled and rescheduled due to lack of transport of the cryoablation unit. Electronically Signed   By: Aletta Edouard M.D.   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 The patient is not a candidate for surgical resection. After discussing risks and benefits of treatment, he has chosen to pursue cryoablation of the tumor. EXAM: CT-GUIDED PERCUTANEOUS CRYOABLATION OF RIGHT RENAL CARCINOMA ANESTHESIA/SEDATION: General MEDICATIONS: 2 g IV Ancef. CONTRAST:  No IV contrast was administered. PROCEDURE: The procedure, risks, benefits, and alternatives were explained to the patient. Questions regarding the procedure were encouraged and answered. The patient understands and consents to the procedure. The patient was placed under general anesthesia. A time-out was performed. Initial unenhanced CT was performed in a supine position with the right side rolled up slightly to localize the right renal carcinoma. The patient was prepped with Betadine in a sterile fashion, and a sterile drape was applied covering the operative field. A sterile gown and sterile gloves were used for the procedure. Under combined ultrasound and CT guidance, a total of four Galil Ice Rod CX percutaneous cryoablation probes were advanced into the right renal tumor. Probe positioning was confirmed by CT prior to cryoablation. Hydrodissection was then performed via a 22 gauge spinal needle. A mixture of 250 mL sterile saline with 5 mL of contrast was mixed. Approximately 160 mL of this fluid was utilized for hydrodissection with injection of fluid between the right kidney and liver. Cryoablation was performed through the 4 probes simultaneously. Initial 10 minute cycle of cryoablation  was performed. This was followed by a 6 minute thaw cycle. A second 10 minute cycle  of cryoablation was then performed. During ablation, periodic CT imaging was performed to monitor ice ball formation and morphology. After active thaw, the cryoablation probes were removed. COMPLICATIONS: None FINDINGS: The superior to mid right renal mass was visible by ultrasound and unenhanced CT. Maximal tumor diameter is approximately 4.8 cm. Hydrodissection was performed to create space between the anterolateral margin of the mass and the inferior liver capsule. Monitoring during cryoablation demonstrates adequate ice ball formation encompassing the mass. IMPRESSION: CT guided percutaneous cryoablation of right renal carcinoma. The patient will be observed overnight. Initial follow-up will be performed in approximately 4 weeks. Electronically Signed   By: Aletta Edouard M.D.   On: 02/29/2016 18:14    ASSESSMENT: 1.  Carcinoma of lung recurrent disease stable disease at present time 2.  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  COPD    Carcinoma of kidney Status post cryoablation (May of 2017).  All the records of CRAYO ablation has been reviewed Retroperitoneal lymphadenopathy low-grade lymphoma T Chest x-ray: Shows a stable disease.  No acute cardiopulmonary abnormality  COPD MEDICAL DECISION MAKING:  All lab data has been reviewed that scan has been reviewed independently  Patient had a cryoablation and tolerated that very well. Repeat PET scan in 6 months Patient is aware of my plan for retirement and will be seen by Dr. Jacinto Reap,  [facility]   Lung cancer, upper lobe   Staging form: Lung, AJCC 7th Edition     Clinical stage from 06/15/2010: yT2, N2, M0 - Signed by [name], NP on 02/03/2015     Pathologic stage from 04/04/2014: yT2, N2, M1 - Signed by [name], NP on 02/03/2015 Lymphoma, small lymphocytic   Staging form: Lymphoid Neoplasms, AJCC 6th  Edition     Clinical stage from 10/15/2013: Stage II - Signed by [name], NP on 02/03/2015 Renal cell cancer   Staging form: Kidney, AJCC 7th Edition     Clinical stage from 02/03/2015: Stage I (yT1a, N0, M0) - Signed by [name], NP on 02/03/2015   Forest Gleason, MD   03/10/2016 3:32 PM

## 2016-03-20 ENCOUNTER — Other Ambulatory Visit: Payer: Self-pay | Admitting: *Deleted

## 2016-03-20 DIAGNOSIS — C641 Malignant neoplasm of right kidney, except renal pelvis: Secondary | ICD-10-CM

## 2016-03-20 DIAGNOSIS — C3492 Malignant neoplasm of unspecified part of left bronchus or lung: Secondary | ICD-10-CM

## 2016-03-20 MED ORDER — HYDROCODONE-ACETAMINOPHEN 5-325 MG PO TABS: 1.0000 | ORAL_TABLET | Freq: Four times a day (QID) | ORAL | Status: DC | PRN

## 2016-03-23 ENCOUNTER — Other Ambulatory Visit (HOSPITAL_COMMUNITY): Payer: Federal, State, Local not specified - PPO

## 2016-03-26 DIAGNOSIS — H40003 Preglaucoma, unspecified, bilateral: Secondary | ICD-10-CM | POA: Diagnosis not present

## 2016-04-04 ENCOUNTER — Other Ambulatory Visit
Admission: RE | Admit: 2016-04-04 | Discharge: 2016-04-04 | Disposition: A | Discharge status: Home / Self Care | Payer: Commercial Managed Care - HMO | Source: Ambulatory Visit | Attending: Interventional Radiology | Admitting: Interventional Radiology

## 2016-04-04 DIAGNOSIS — N2889 Other specified disorders of kidney and ureter: Secondary | ICD-10-CM | POA: Diagnosis not present

## 2016-04-04 DIAGNOSIS — H40003 Preglaucoma, unspecified, bilateral: Secondary | ICD-10-CM | POA: Diagnosis not present

## 2016-04-04 LAB — BASIC METABOLIC PANEL
Anion gap: 9 (ref 5–15)
BUN: 20 mg/dL (ref 6–20)
CALCIUM: 9.3 mg/dL (ref 8.9–10.3)
CHLORIDE: 96 mmol/L — AB (ref 101–111)
CO2: 36 mmol/L — ABNORMAL HIGH (ref 22–32)
CREATININE: 1.3 mg/dL — AB (ref 0.61–1.24)
GFR calc non Af Amer: 56 mL/min — ABNORMAL LOW (ref >60)
Glucose, Bld: 151 mg/dL — ABNORMAL HIGH (ref 65–99)
Potassium: 3.5 mmol/L (ref 3.5–5.1)
SODIUM: 141 mmol/L (ref 135–145)

## 2016-04-10 ENCOUNTER — Other Ambulatory Visit: Payer: Self-pay | Admitting: *Deleted

## 2016-04-10 DIAGNOSIS — C641 Malignant neoplasm of right kidney, except renal pelvis: Secondary | ICD-10-CM

## 2016-04-10 DIAGNOSIS — C3492 Malignant neoplasm of unspecified part of left bronchus or lung: Secondary | ICD-10-CM

## 2016-04-10 MED ORDER — HYDROCODONE-ACETAMINOPHEN 5-325 MG PO TABS: 1.0000 | ORAL_TABLET | Freq: Four times a day (QID) | ORAL | Status: DC | PRN

## 2016-04-11 ENCOUNTER — Other Ambulatory Visit (HOSPITAL_COMMUNITY): Payer: Self-pay | Admitting: General Surgery

## 2016-04-11 ENCOUNTER — Other Ambulatory Visit (HOSPITAL_COMMUNITY): Payer: Self-pay | Admitting: Interventional Radiology

## 2016-04-11 ENCOUNTER — Ambulatory Visit
Admission: RE | Admit: 2016-04-11 | Discharge: 2016-04-11 | Disposition: A | Discharge status: Home / Self Care | Payer: Commercial Managed Care - HMO | Source: Ambulatory Visit | Attending: General Surgery | Admitting: General Surgery

## 2016-04-11 DIAGNOSIS — C641 Malignant neoplasm of right kidney, except renal pelvis: Secondary | ICD-10-CM

## 2016-04-11 DIAGNOSIS — C649 Malignant neoplasm of unspecified kidney, except renal pelvis: Secondary | ICD-10-CM | POA: Diagnosis not present

## 2016-04-11 NOTE — Consult Note (Addendum)
Chief Complaint: Status post percutaneous cryoablation of a right renal carcinoma on 02/29/2016.  History of Present Illness: Craig [name]. is a 65 y.o. male status post percutaneous cryoablation of a biopsy-proven right clear cell renal carcinoma on 02/29/2016. At the time of ablation, maximal tumor diameter was approximately 4.8 cm. Hydrodissection was performed between the tumor and liver capsule. He tolerated ablation quite well and is currently asymptomatic. He also tolerated general anesthesia quite well with no respiratory compromise and was discharged the day after the procedure.  Past Medical History  Diagnosis Date  . COPD (chronic obstructive pulmonary disease) ([name])   . Hypertension   . Diabetes mellitus without complication ([name])   . Respiratory failure ([name]) 07/12/2010    acute  . Glaucoma   . Right renal mass   . Supplemental oxygen dependent     USES 3 LITERS CONTINUOUSLY  . Shortness of breath dyspnea   . Asthma   . Stroke ([name]) 1990'S    NO RESIDUAL PROBLEMS  . GERD (gastroesophageal reflux disease)   . Difficulty sleeping   . Lung cancer ([name])     right upper lobe mass positive for adenocarcinoma  . Cancer ([name])   . Renal cancer ([name])   . Lymphoma ([name])     low grade  . Colon cancer ([name])   . Dysrhythmia   . History of bronchitis     No past surgical history on file.  Allergies: Iodine and Nexium  Medications: Prior to Admission medications   Medication Sig Start Date End Date Taking? Authorizing Provider  albuterol (PROVENTIL HFA;VENTOLIN HFA) 108 (90 BASE) MCG/ACT inhaler Inhale 2 puffs into the lungs every 6 (six) hours as needed for wheezing or shortness of breath.     Historical Provider, MD  albuterol (PROVENTIL) (2.5 MG/3ML) 0.083% nebulizer solution USE 1 VIAL IN NEBULIZER EVERY 3-4 HOURS AS NEEDED Patient taking differently: USE 1 VIAL IN NEBULIZER EVERY 3-4 HOURS AS NEEDED FOR SHORTNESS OF BREATH OR WHEEZING. 07/05/15   Forest Gleason, MD  budesonide-formoterol (SYMBICORT) 160-4.5 MCG/ACT inhaler Inhale 2 puffs into the lungs 2 (two) times daily.     Historical Provider, MD  diphenhydrAMINE (BENADRYL) 12.5 MG chewable tablet Chew 25 mg by mouth daily as needed for allergies.     Historical Provider, MD  docusate sodium (COLACE) 100 MG capsule Take 1 capsule (100 mg total) by mouth 2 (two) times daily. 03/01/16   [name], PA-C  glipiZIDE (GLUCOTROL) 10 MG tablet TAKE 1 TABLET BY MOUTH DAILY 08/16/15   Forest Gleason, MD  HYDROcodone-acetaminophen (NORCO/VICODIN) 5-325 MG tablet Take 1-2 tablets by mouth every 6 (six) hours as needed for moderate pain. 04/10/16   [name], NP  latanoprost (XALATAN) 0.005 % ophthalmic solution Place 1 drop into both eyes every other day.  04/19/15   Historical Provider, MD  loperamide (IMODIUM A-D) 2 MG tablet Take 2 mg by mouth 4 (four) times daily as needed for diarrhea or loose stools. Reported on 12/21/2015    Historical Provider, MD  losartan-hydrochlorothiazide (HYZAAR) 50-12.5 MG per tablet Take 1 tablet by mouth daily.    Historical Provider, MD  pioglitazone (ACTOS) 30 MG tablet Take 30 mg by mouth daily.    Historical Provider, MD  predniSONE (DELTASONE) 10 MG tablet Take 10 mg by mouth daily with breakfast.    Historical Provider, MD  predniSONE (DELTASONE) 10 MG tablet TAKE 1 TABLET (10 MG TOTAL) BY MOUTH DAILY WITH BREAKFAST.  03/05/16   Forest Gleason, MD  temazepam (RESTORIL) 7.5 MG capsule Take 7.5-15 mg by mouth at bedtime as needed for sleep. Reported on 12/21/2015 05/02/15   Historical Provider, MD  theophylline (UNIPHYL) 400 MG 24 hr tablet Take 400 mg by mouth daily as needed (For breathing.).  11/11/15   Historical Provider, MD  tiotropium (SPIRIVA) 18 MCG inhalation capsule Place 18 mcg into inhaler and inhale daily.     Historical Provider, MD     Family History  Problem Relation Age of Onset  . Diabetes Mellitus II Mother   . Heart disease Father   . Hypertension  Father   . Diabetes Mellitus II Sister   . Prostate cancer Brother     Social History   Social History  . Marital Status: Single    Spouse Name: N/A  . Number of Children: N/A  . Years of Education: N/A   Social History Main Topics  . Smoking status: Former Smoker -- 1.00 packs/day for 30 years    Types: Cigarettes    Start date: 12/28/1968    Quit date: 01/13/2010  . Smokeless tobacco: Never Used  . Alcohol Use: No     Comment: Stopped 2001  . Drug Use: No  . Sexual Activity: Not on file   Other Topics Concern  . Not on file   Social History Narrative     Review of Systems: A 12 point ROS discussed and pertinent positives are indicated in the HPI above.  All other systems are negative.  Review of Systems  Constitutional: Negative.   Respiratory: Negative.   Cardiovascular: Negative.   Gastrointestinal: Negative.   Genitourinary: Negative.   Neurological: Negative.      Vital Signs: BP 137/76 mmHg  Pulse 121  Temp(Src) 98.1 F (36.7 C) (Oral)  Resp 181  Ht '5\' 8"'$  (1.727 m)  Wt 154 lb (69.854 kg)  BMI 23.42 kg/m2  SpO2 98%  Physical Exam  Constitutional: He is oriented to person, place, and time. No distress.  Abdominal: Soft. He exhibits no distension. There is no tenderness. There is no rebound and no guarding.  Musculoskeletal: He exhibits no edema.  Neurological: He is alert and oriented to person, place, and time.  Skin: He is not diaphoretic.  Nursing note and vitals reviewed.   Imaging: No results found.  Labs:  CBC:  Recent Labs  02/03/16 1153 02/29/16 1030 03/01/16 0334 03/07/16 1433  WBC 8.2 8.5 13.9* 11.7*  HGB 9.7* 9.5* 9.4* 9.1*  HCT 30.8* 29.2* 29.2* 27.8*  PLT 255 159 173 260    COAGS:  Recent Labs  02/03/16 1153 02/29/16 1030  INR 1.06 0.98  APTT 30 30    BMP:  Recent Labs  02/29/16 1030 03/01/16 0334 03/07/16 1433 04/04/16 1047  NA 137 139 141 141  K 4.7 4.8 3.5 3.5  CL 98* 100* 100* 96*  CO2 33* 33*  34* 36*  GLUCOSE 150* 131* 197* 151*  BUN 26* 28* 25* 20  CALCIUM 8.9 8.6* 9.0 9.3  CREATININE 1.30* 1.38* 1.31* 1.30*  GFRNONAA 56* 52* 56* 56*  GFRAA >60 >60 >60 >60    LIVER FUNCTION TESTS:  Recent Labs  11/19/15 1600 11/20/15 0649 12/07/15 1112 03/07/16 1433  BILITOT 0.3 0.4 0.4 0.2*  AST 17 16 13* 15  ALT 14* 13* 15* 13*  ALKPHOS 81 64 58 62  PROT 8.0 7.1 6.7 6.9  ALBUMIN 3.9 3.4* 3.7 3.6    Assessment and Plan:  Mr. Demelo  is doing well 4 weeks following cryoablation of a right renal carcinoma. Renal function is stable and has not been impaired by ablation. He is scheduled to undergo a PET scan in August for restaging of lung carcinoma. I have recommended that we obtain a CT of the abdomen with and without contrast in late July, 2 months following ablation to evaluate the renal ablation site. He will need to be premedicated for contrast allergy. He has requested that the CT scan be performed at [facility]. I will meet back with him following the CT scan to review findings with him.  Electronically SignedAletta Edouard T 04/11/2016, 3:54 PM   I spent a total of 15 Minutes in face to face in clinical consultation, greater than 50% of which was counseling/coordinating care post cryoablation of a right renal carcinoma.

## 2016-04-12 ENCOUNTER — Other Ambulatory Visit: Payer: Self-pay | Admitting: Radiology

## 2016-04-12 ENCOUNTER — Encounter: Payer: Self-pay | Admitting: Radiology

## 2016-04-12 DIAGNOSIS — C641 Malignant neoplasm of right kidney, except renal pelvis: Secondary | ICD-10-CM

## 2016-04-12 MED ORDER — PREDNISONE 50 MG PO TABS: 50.0000 mg | ORAL_TABLET | ORAL | Status: DC

## 2016-04-12 MED ORDER — DIPHENHYDRAMINE HCL 25 MG PO TABS: 50.0000 mg | ORAL_TABLET | ORAL | Status: DC

## 2016-05-02 ENCOUNTER — Other Ambulatory Visit: Payer: Self-pay | Admitting: *Deleted

## 2016-05-02 ENCOUNTER — Telehealth: Payer: Self-pay | Admitting: *Deleted

## 2016-05-02 DIAGNOSIS — C641 Malignant neoplasm of right kidney, except renal pelvis: Secondary | ICD-10-CM

## 2016-05-02 DIAGNOSIS — C3410 Malignant neoplasm of upper lobe, unspecified bronchus or lung: Secondary | ICD-10-CM

## 2016-05-02 DIAGNOSIS — C3492 Malignant neoplasm of unspecified part of left bronchus or lung: Secondary | ICD-10-CM

## 2016-05-02 MED ORDER — HYDROCODONE-ACETAMINOPHEN 5-325 MG PO TABS: 1.0000 | ORAL_TABLET | Freq: Four times a day (QID) | ORAL | Status: DC | PRN

## 2016-05-02 NOTE — Telephone Encounter (Signed)
Per Dr. Rogue Bussing patient to come in for chest xray, left vm message for patient regarding need for chest xray. Orders placed for chest xray, patient can come over at his convenience to have xray performed. Prescription for pain medication will be placed downstairs for pick up.

## 2016-05-02 NOTE — Telephone Encounter (Signed)
Patient called in to request refill on pain medication. Patient also requesting medication for chest congestion. Patient reports productive cough for 1 week, he denies any fevers during this time. Patient reports he has tried mucinex and it is not helping. Please advise.

## 2016-05-04 ENCOUNTER — Telehealth: Payer: Self-pay

## 2016-05-04 NOTE — Telephone Encounter (Signed)
Pt called in and said he was returning a phone call.  Pt left VM asking if he needed his CT and that he has one scheduled for Tuesday.  However wants to know about another scan that was scheduled with Dr. Kathlene Cote.

## 2016-05-04 NOTE — Telephone Encounter (Signed)
Called patient and informed him that he can have his CT and CXR on the same date.  Patient advised to tell registration that he is here for both tests when he checks in.  Verbalized understanding.

## 2016-05-08 ENCOUNTER — Ambulatory Visit: Admission: RE | Admit: 2016-05-08 | Payer: Commercial Managed Care - HMO | Source: Ambulatory Visit

## 2016-05-09 ENCOUNTER — Ambulatory Visit
Admission: RE | Admit: 2016-05-09 | Discharge: 2016-05-09 | Disposition: A | Discharge status: Home / Self Care | Payer: Commercial Managed Care - HMO | Source: Ambulatory Visit | Attending: Internal Medicine | Admitting: Internal Medicine

## 2016-05-09 DIAGNOSIS — C3410 Malignant neoplasm of upper lobe, unspecified bronchus or lung: Secondary | ICD-10-CM

## 2016-05-09 DIAGNOSIS — J449 Chronic obstructive pulmonary disease, unspecified: Secondary | ICD-10-CM | POA: Insufficient documentation

## 2016-05-09 DIAGNOSIS — Z452 Encounter for adjustment and management of vascular access device: Secondary | ICD-10-CM | POA: Insufficient documentation

## 2016-05-09 DIAGNOSIS — R0602 Shortness of breath: Secondary | ICD-10-CM | POA: Diagnosis not present

## 2016-05-10 ENCOUNTER — Other Ambulatory Visit: Payer: Commercial Managed Care - HMO

## 2016-05-14 ENCOUNTER — Telehealth: Payer: Self-pay | Admitting: *Deleted

## 2016-05-14 NOTE — Telephone Encounter (Signed)
-----   Message from [name], MD sent at 05/11/2016  7:48 PM EDT ----- No evidence of pneumonia of CXR- please inform pt.

## 2016-05-14 NOTE — Telephone Encounter (Signed)
Pt contacted. Explained that cxr results did not show any signs of pneumonia. Pt thanked me for calling.

## 2016-05-16 ENCOUNTER — Ambulatory Visit
Admission: RE | Admit: 2016-05-16 | Discharge: 2016-05-16 | Disposition: A | Discharge status: Home / Self Care | Payer: Commercial Managed Care - HMO | Source: Ambulatory Visit | Attending: Interventional Radiology | Admitting: Interventional Radiology

## 2016-05-16 DIAGNOSIS — C8590 Non-Hodgkin lymphoma, unspecified, unspecified site: Secondary | ICD-10-CM | POA: Diagnosis not present

## 2016-05-16 DIAGNOSIS — I700 Atherosclerosis of aorta: Secondary | ICD-10-CM | POA: Insufficient documentation

## 2016-05-16 DIAGNOSIS — R590 Localized enlarged lymph nodes: Secondary | ICD-10-CM | POA: Insufficient documentation

## 2016-05-16 DIAGNOSIS — C641 Malignant neoplasm of right kidney, except renal pelvis: Secondary | ICD-10-CM | POA: Diagnosis not present

## 2016-05-16 LAB — POCT I-STAT CREATININE: Creatinine, Ser: 1.3 mg/dL — ABNORMAL HIGH (ref 0.61–1.24)

## 2016-05-16 MED ORDER — IOPAMIDOL (ISOVUE-370) INJECTION 76%
100.0000 mL | Freq: Once | INTRAVENOUS | Status: AC | PRN
  Administered 2016-05-16: 100 mL via INTRAVENOUS

## 2016-05-22 ENCOUNTER — Other Ambulatory Visit: Payer: Self-pay | Admitting: *Deleted

## 2016-05-22 DIAGNOSIS — C641 Malignant neoplasm of right kidney, except renal pelvis: Secondary | ICD-10-CM

## 2016-05-22 DIAGNOSIS — C3492 Malignant neoplasm of unspecified part of left bronchus or lung: Secondary | ICD-10-CM

## 2016-05-22 MED ORDER — HYDROCODONE-ACETAMINOPHEN 5-325 MG PO TABS: 1.0000 | ORAL_TABLET | Freq: Four times a day (QID) | ORAL | 0 refills | Status: DC | PRN

## 2016-05-28 DIAGNOSIS — J449 Chronic obstructive pulmonary disease, unspecified: Secondary | ICD-10-CM | POA: Diagnosis not present

## 2016-05-28 DIAGNOSIS — C3491 Malignant neoplasm of unspecified part of right bronchus or lung: Secondary | ICD-10-CM | POA: Diagnosis not present

## 2016-05-28 DIAGNOSIS — C8293 Follicular lymphoma, unspecified, intra-abdominal lymph nodes: Secondary | ICD-10-CM | POA: Diagnosis not present

## 2016-05-30 ENCOUNTER — Ambulatory Visit
Admission: RE | Admit: 2016-05-30 | Discharge: 2016-05-30 | Disposition: A | Discharge status: Home / Self Care | Payer: Commercial Managed Care - HMO | Source: Ambulatory Visit | Attending: Oncology | Admitting: Oncology

## 2016-05-30 ENCOUNTER — Other Ambulatory Visit: Payer: Self-pay | Admitting: *Deleted

## 2016-05-30 DIAGNOSIS — R590 Localized enlarged lymph nodes: Secondary | ICD-10-CM | POA: Insufficient documentation

## 2016-05-30 DIAGNOSIS — I700 Atherosclerosis of aorta: Secondary | ICD-10-CM | POA: Insufficient documentation

## 2016-05-30 DIAGNOSIS — K8020 Calculus of gallbladder without cholecystitis without obstruction: Secondary | ICD-10-CM | POA: Insufficient documentation

## 2016-05-30 DIAGNOSIS — C3490 Malignant neoplasm of unspecified part of unspecified bronchus or lung: Secondary | ICD-10-CM | POA: Diagnosis not present

## 2016-05-30 DIAGNOSIS — I2510 Atherosclerotic heart disease of native coronary artery without angina pectoris: Secondary | ICD-10-CM

## 2016-05-30 DIAGNOSIS — Q396 Congenital diverticulum of esophagus: Secondary | ICD-10-CM

## 2016-05-30 DIAGNOSIS — C3410 Malignant neoplasm of upper lobe, unspecified bronchus or lung: Secondary | ICD-10-CM

## 2016-05-30 DIAGNOSIS — C641 Malignant neoplasm of right kidney, except renal pelvis: Secondary | ICD-10-CM

## 2016-05-30 LAB — GLUCOSE, CAPILLARY: Glucose-Capillary: 144 mg/dL — ABNORMAL HIGH (ref 65–99)

## 2016-05-30 MED ORDER — FLUDEOXYGLUCOSE F - 18 (FDG) INJECTION
12.0000 | Freq: Once | INTRAVENOUS | Status: AC | PRN
  Administered 2016-05-30: 12.65 via INTRAVENOUS

## 2016-05-31 ENCOUNTER — Inpatient Hospital Stay
Admission: EM | Admit: 2016-05-31 | Discharge: 2016-06-02 | DRG: 190 | Disposition: A | Discharge status: Home / Self Care | Payer: Commercial Managed Care - HMO | Attending: Internal Medicine | Admitting: Internal Medicine

## 2016-05-31 ENCOUNTER — Other Ambulatory Visit: Payer: Self-pay

## 2016-05-31 ENCOUNTER — Emergency Department: Payer: Commercial Managed Care - HMO

## 2016-05-31 ENCOUNTER — Ambulatory Visit
Admission: RE | Admit: 2016-05-31 | Discharge: 2016-05-31 | Disposition: A | Discharge status: Home / Self Care | Payer: Commercial Managed Care - HMO | Source: Ambulatory Visit | Attending: Interventional Radiology | Admitting: Interventional Radiology

## 2016-05-31 ENCOUNTER — Encounter: Payer: Self-pay | Admitting: *Deleted

## 2016-05-31 DIAGNOSIS — J209 Acute bronchitis, unspecified: Secondary | ICD-10-CM | POA: Diagnosis present

## 2016-05-31 DIAGNOSIS — K219 Gastro-esophageal reflux disease without esophagitis: Secondary | ICD-10-CM | POA: Diagnosis present

## 2016-05-31 DIAGNOSIS — Z66 Do not resuscitate: Secondary | ICD-10-CM | POA: Diagnosis present

## 2016-05-31 DIAGNOSIS — Z85038 Personal history of other malignant neoplasm of large intestine: Secondary | ICD-10-CM | POA: Diagnosis not present

## 2016-05-31 DIAGNOSIS — Z833 Family history of diabetes mellitus: Secondary | ICD-10-CM

## 2016-05-31 DIAGNOSIS — Z79899 Other long term (current) drug therapy: Secondary | ICD-10-CM | POA: Diagnosis not present

## 2016-05-31 DIAGNOSIS — E119 Type 2 diabetes mellitus without complications: Secondary | ICD-10-CM

## 2016-05-31 DIAGNOSIS — Z85118 Personal history of other malignant neoplasm of bronchus and lung: Secondary | ICD-10-CM

## 2016-05-31 DIAGNOSIS — J9621 Acute and chronic respiratory failure with hypoxia: Secondary | ICD-10-CM | POA: Diagnosis present

## 2016-05-31 DIAGNOSIS — Z9981 Dependence on supplemental oxygen: Secondary | ICD-10-CM | POA: Diagnosis not present

## 2016-05-31 DIAGNOSIS — Z8042 Family history of malignant neoplasm of prostate: Secondary | ICD-10-CM

## 2016-05-31 DIAGNOSIS — C3490 Malignant neoplasm of unspecified part of unspecified bronchus or lung: Secondary | ICD-10-CM | POA: Diagnosis not present

## 2016-05-31 DIAGNOSIS — I10 Essential (primary) hypertension: Secondary | ICD-10-CM | POA: Diagnosis not present

## 2016-05-31 DIAGNOSIS — Z85528 Personal history of other malignant neoplasm of kidney: Secondary | ICD-10-CM | POA: Diagnosis not present

## 2016-05-31 DIAGNOSIS — C641 Malignant neoplasm of right kidney, except renal pelvis: Secondary | ICD-10-CM

## 2016-05-31 DIAGNOSIS — C8590 Non-Hodgkin lymphoma, unspecified, unspecified site: Secondary | ICD-10-CM | POA: Diagnosis present

## 2016-05-31 DIAGNOSIS — N189 Chronic kidney disease, unspecified: Secondary | ICD-10-CM | POA: Diagnosis present

## 2016-05-31 DIAGNOSIS — J42 Unspecified chronic bronchitis: Secondary | ICD-10-CM

## 2016-05-31 DIAGNOSIS — R0602 Shortness of breath: Secondary | ICD-10-CM | POA: Diagnosis not present

## 2016-05-31 DIAGNOSIS — N182 Chronic kidney disease, stage 2 (mild): Secondary | ICD-10-CM

## 2016-05-31 DIAGNOSIS — E1122 Type 2 diabetes mellitus with diabetic chronic kidney disease: Secondary | ICD-10-CM | POA: Diagnosis not present

## 2016-05-31 DIAGNOSIS — Z923 Personal history of irradiation: Secondary | ICD-10-CM

## 2016-05-31 DIAGNOSIS — Z9221 Personal history of antineoplastic chemotherapy: Secondary | ICD-10-CM | POA: Diagnosis not present

## 2016-05-31 DIAGNOSIS — J9600 Acute respiratory failure, unspecified whether with hypoxia or hypercapnia: Secondary | ICD-10-CM | POA: Diagnosis not present

## 2016-05-31 DIAGNOSIS — J440 Chronic obstructive pulmonary disease with acute lower respiratory infection: Principal | ICD-10-CM | POA: Diagnosis present

## 2016-05-31 DIAGNOSIS — C8303 Small cell B-cell lymphoma, intra-abdominal lymph nodes: Secondary | ICD-10-CM | POA: Diagnosis not present

## 2016-05-31 DIAGNOSIS — Z8673 Personal history of transient ischemic attack (TIA), and cerebral infarction without residual deficits: Secondary | ICD-10-CM

## 2016-05-31 DIAGNOSIS — Z8249 Family history of ischemic heart disease and other diseases of the circulatory system: Secondary | ICD-10-CM

## 2016-05-31 DIAGNOSIS — Z87891 Personal history of nicotine dependence: Secondary | ICD-10-CM

## 2016-05-31 DIAGNOSIS — H409 Unspecified glaucoma: Secondary | ICD-10-CM | POA: Diagnosis present

## 2016-05-31 DIAGNOSIS — J441 Chronic obstructive pulmonary disease with (acute) exacerbation: Secondary | ICD-10-CM | POA: Diagnosis present

## 2016-05-31 DIAGNOSIS — I129 Hypertensive chronic kidney disease with stage 1 through stage 4 chronic kidney disease, or unspecified chronic kidney disease: Secondary | ICD-10-CM | POA: Diagnosis present

## 2016-05-31 DIAGNOSIS — R062 Wheezing: Secondary | ICD-10-CM | POA: Diagnosis not present

## 2016-05-31 HISTORY — PX: IR GENERIC HISTORICAL: IMG1180011

## 2016-05-31 LAB — CBC WITH DIFFERENTIAL/PLATELET
BASOS ABS: 0 10*3/uL (ref 0–0.1)
BASOS PCT: 0 %
Eosinophils Absolute: 0.2 10*3/uL (ref 0–0.7)
Eosinophils Relative: 2 %
HEMATOCRIT: 29.4 % — AB (ref 40.0–52.0)
HEMOGLOBIN: 9.7 g/dL — AB (ref 13.0–18.0)
LYMPHS PCT: 34 %
Lymphs Abs: 2.8 10*3/uL (ref 1.0–3.6)
MCH: 27.8 pg (ref 26.0–34.0)
MCHC: 33.1 g/dL (ref 32.0–36.0)
MCV: 84 fL (ref 80.0–100.0)
MONO ABS: 0.8 10*3/uL (ref 0.2–1.0)
Monocytes Relative: 10 %
NEUTROS ABS: 4.5 10*3/uL (ref 1.4–6.5)
NEUTROS PCT: 54 %
Platelets: 143 10*3/uL — ABNORMAL LOW (ref 150–440)
RBC: 3.5 MIL/uL — ABNORMAL LOW (ref 4.40–5.90)
RDW: 15.5 % — AB (ref 11.5–14.5)
WBC: 8.3 10*3/uL (ref 3.8–10.6)

## 2016-05-31 LAB — GLUCOSE, CAPILLARY
GLUCOSE-CAPILLARY: 276 mg/dL — AB (ref 65–99)
Glucose-Capillary: 253 mg/dL — ABNORMAL HIGH (ref 65–99)

## 2016-05-31 LAB — TROPONIN I

## 2016-05-31 MED ORDER — AZITHROMYCIN 500 MG PO TABS: 500.0000 mg | ORAL_TABLET | Freq: Every day | ORAL | Status: DC

## 2016-05-31 MED ORDER — ENOXAPARIN SODIUM 40 MG/0.4ML ~~LOC~~ SOLN
40.0000 mg | SUBCUTANEOUS | Status: DC
  Administered 2016-05-31 – 2016-06-01 (×2): 40 mg via SUBCUTANEOUS
  Filled 2016-05-31 (×2): qty 0.4

## 2016-05-31 MED ORDER — TIOTROPIUM BROMIDE MONOHYDRATE 18 MCG IN CAPS
18.0000 ug | ORAL_CAPSULE | Freq: Every day | RESPIRATORY_TRACT | Status: DC
  Administered 2016-06-01 – 2016-06-02 (×2): 18 ug via RESPIRATORY_TRACT
  Filled 2016-05-31: qty 5

## 2016-05-31 MED ORDER — DIPHENHYDRAMINE HCL 12.5 MG/5ML PO ELIX
25.0000 mg | ORAL_SOLUTION | Freq: Every day | ORAL | Status: DC | PRN
  Administered 2016-06-01: 14:00:00 25 mg via ORAL
  Filled 2016-05-31 (×2): qty 10

## 2016-05-31 MED ORDER — HYDROCHLOROTHIAZIDE 12.5 MG PO CAPS
12.5000 mg | ORAL_CAPSULE | Freq: Every day | ORAL | Status: DC
  Administered 2016-06-01 – 2016-06-02 (×2): 12.5 mg via ORAL
  Filled 2016-05-31 (×2): qty 1

## 2016-05-31 MED ORDER — INSULIN ASPART 100 UNIT/ML ~~LOC~~ SOLN
0.0000 [IU] | Freq: Every day | SUBCUTANEOUS | Status: DC
  Administered 2016-05-31: 21:00:00 3 [IU] via SUBCUTANEOUS
  Filled 2016-05-31: qty 3

## 2016-05-31 MED ORDER — HYDROCODONE-ACETAMINOPHEN 5-325 MG PO TABS
1.0000 | ORAL_TABLET | Freq: Four times a day (QID) | ORAL | Status: DC | PRN
  Administered 2016-05-31 – 2016-06-02 (×2): 1 via ORAL
  Filled 2016-05-31 (×2): qty 1

## 2016-05-31 MED ORDER — ONDANSETRON HCL 4 MG PO TABS: 4.0000 mg | ORAL_TABLET | Freq: Four times a day (QID) | ORAL | Status: DC | PRN

## 2016-05-31 MED ORDER — TEMAZEPAM 7.5 MG PO CAPS
7.5000 mg | ORAL_CAPSULE | Freq: Every evening | ORAL | Status: DC | PRN
  Administered 2016-06-01 – 2016-06-02 (×2): 15 mg via ORAL
  Filled 2016-05-31 (×2): qty 1
  Filled 2016-05-31: qty 2

## 2016-05-31 MED ORDER — LOSARTAN POTASSIUM 50 MG PO TABS
50.0000 mg | ORAL_TABLET | Freq: Every day | ORAL | Status: DC
  Administered 2016-06-01 – 2016-06-02 (×2): 50 mg via ORAL
  Filled 2016-05-31 (×2): qty 1

## 2016-05-31 MED ORDER — DEXTROSE 5 % IV SOLN
500.0000 mg | Freq: Once | INTRAVENOUS | Status: AC
  Administered 2016-05-31: 500 mg via INTRAVENOUS
  Filled 2016-05-31: qty 500

## 2016-05-31 MED ORDER — ALBUTEROL SULFATE (2.5 MG/3ML) 0.083% IN NEBU
INHALATION_SOLUTION | RESPIRATORY_TRACT | Status: AC
  Filled 2016-05-31: qty 6

## 2016-05-31 MED ORDER — LOSARTAN POTASSIUM-HCTZ 50-12.5 MG PO TABS: 1.0000 | ORAL_TABLET | Freq: Every day | ORAL | Status: DC

## 2016-05-31 MED ORDER — LOPERAMIDE HCL 2 MG PO CAPS: 2.0000 mg | ORAL_CAPSULE | Freq: Four times a day (QID) | ORAL | Status: DC | PRN

## 2016-05-31 MED ORDER — IPRATROPIUM-ALBUTEROL 0.5-2.5 (3) MG/3ML IN SOLN
9.0000 mL | Freq: Once | RESPIRATORY_TRACT | Status: AC
  Administered 2016-05-31: 9 mL via RESPIRATORY_TRACT
  Filled 2016-05-31: qty 9

## 2016-05-31 MED ORDER — DOCUSATE SODIUM 100 MG PO CAPS: 100.0000 mg | ORAL_CAPSULE | Freq: Two times a day (BID) | ORAL | Status: DC

## 2016-05-31 MED ORDER — THEOPHYLLINE ER 400 MG PO TB24
400.0000 mg | ORAL_TABLET | Freq: Every day | ORAL | Status: DC
  Administered 2016-06-01: 22:00:00 400 mg via ORAL
  Filled 2016-05-31: qty 1

## 2016-05-31 MED ORDER — MAGNESIUM SULFATE 2 GM/50ML IV SOLN
2.0000 g | Freq: Once | INTRAVENOUS | Status: AC
Start: 2016-05-31 — End: 2016-05-31
  Administered 2016-05-31: 2 g via INTRAVENOUS
  Filled 2016-05-31: qty 50

## 2016-05-31 MED ORDER — DIPHENHYDRAMINE HCL 25 MG PO TABS: 50.0000 mg | ORAL_TABLET | ORAL | Status: DC

## 2016-05-31 MED ORDER — ALBUTEROL SULFATE (2.5 MG/3ML) 0.083% IN NEBU
5.0000 mg | INHALATION_SOLUTION | Freq: Once | RESPIRATORY_TRACT | Status: AC
  Administered 2016-05-31: 5 mg via RESPIRATORY_TRACT

## 2016-05-31 MED ORDER — AZITHROMYCIN 250 MG PO TABS: 250.0000 mg | ORAL_TABLET | Freq: Every day | ORAL | Status: DC

## 2016-05-31 MED ORDER — ACETAMINOPHEN 650 MG RE SUPP: 650.0000 mg | Freq: Four times a day (QID) | RECTAL | Status: DC | PRN

## 2016-05-31 MED ORDER — ALBUTEROL SULFATE (2.5 MG/3ML) 0.083% IN NEBU: 2.5000 mg | INHALATION_SOLUTION | RESPIRATORY_TRACT | Status: DC | PRN

## 2016-05-31 MED ORDER — INSULIN ASPART 100 UNIT/ML ~~LOC~~ SOLN
0.0000 [IU] | Freq: Three times a day (TID) | SUBCUTANEOUS | Status: DC
  Administered 2016-06-01: 08:00:00 3 [IU] via SUBCUTANEOUS
  Filled 2016-05-31: qty 3

## 2016-05-31 MED ORDER — IPRATROPIUM-ALBUTEROL 0.5-2.5 (3) MG/3ML IN SOLN
3.0000 mL | Freq: Four times a day (QID) | RESPIRATORY_TRACT | Status: DC
  Administered 2016-05-31 – 2016-06-01 (×3): 3 mL via RESPIRATORY_TRACT
  Filled 2016-05-31 (×3): qty 3

## 2016-05-31 MED ORDER — AZITHROMYCIN 250 MG PO TABS
250.0000 mg | ORAL_TABLET | Freq: Every day | ORAL | Status: DC
  Administered 2016-06-01 – 2016-06-02 (×2): 250 mg via ORAL
  Filled 2016-05-31 (×2): qty 1

## 2016-05-31 MED ORDER — METHYLPREDNISOLONE SODIUM SUCC 125 MG IJ SOLR
60.0000 mg | Freq: Four times a day (QID) | INTRAMUSCULAR | Status: DC
  Administered 2016-05-31 – 2016-06-01 (×2): 60 mg via INTRAVENOUS
  Filled 2016-05-31 (×2): qty 2

## 2016-05-31 MED ORDER — ACETAMINOPHEN 325 MG PO TABS: 650.0000 mg | ORAL_TABLET | Freq: Four times a day (QID) | ORAL | Status: DC | PRN

## 2016-05-31 MED ORDER — SODIUM CHLORIDE 0.9% FLUSH
3.0000 mL | Freq: Two times a day (BID) | INTRAVENOUS | Status: DC
  Administered 2016-05-31 – 2016-06-02 (×4): 3 mL via INTRAVENOUS

## 2016-05-31 MED ORDER — METHYLPREDNISOLONE SODIUM SUCC 125 MG IJ SOLR
125.0000 mg | Freq: Once | INTRAMUSCULAR | Status: AC
  Administered 2016-05-31: 125 mg via INTRAVENOUS
  Filled 2016-05-31: qty 2

## 2016-05-31 MED ORDER — GLIPIZIDE 5 MG PO TABS
10.0000 mg | ORAL_TABLET | Freq: Every day | ORAL | Status: DC
  Filled 2016-05-31: qty 2

## 2016-05-31 MED ORDER — ONDANSETRON HCL 4 MG/2ML IJ SOLN: 4.0000 mg | Freq: Four times a day (QID) | INTRAMUSCULAR | Status: DC | PRN

## 2016-05-31 MED ORDER — DOCUSATE SODIUM 100 MG PO CAPS
100.0000 mg | ORAL_CAPSULE | Freq: Two times a day (BID) | ORAL | Status: DC
  Filled 2016-05-31 (×4): qty 1

## 2016-05-31 NOTE — ED Notes (Signed)
Patient's O2 was decreased to 3L O2 via [place] which is his home rate. Patient is tolerated the [place] well.

## 2016-05-31 NOTE — ED Triage Notes (Signed)
Patient arrived via EMS for c/o shortness of breath that had been present for one hour PTA. Patient has COPD and had to turn off his oxygen due to device overheating. Patient is on 3L O2 at home.

## 2016-05-31 NOTE — Progress Notes (Signed)
To 108 via str from ed. Alert.  02 in use  No severe distress at this time

## 2016-05-31 NOTE — H&P (Signed)
[facility] NAME: Craig [name]    MR#:  [id number]  DATE OF BIRTH:  02/15/1951  DATE OF ADMISSION:  05/31/2016  PRIMARY CARE PHYSICIAN: [name], MD   REQUESTING/REFERRING PHYSICIAN: Orbie Pyo, MD  CHIEF COMPLAINT:   sob HISTORY OF PRESENT ILLNESS:  Craig [name]  is a 65 y.o. male with a known history of Chronic COPD, lung cancer, status post chemotherapy and radiation therapy currently under remission, lymphoma, renal cancer, colon cancer, diabetes mellitus, hypertension and GERD and multiple other medical problems is presenting to the ED with a chief complaint of shortness of breath. Patient lives on 3 L of home oxygen and reports that his portable oxygen tank was malfunctioning. Denies any chest pain. Denies any dizziness or loss of consciousness. Chest x-ray with no acute findings. Patient has received several breathing treatments in the emergency department as reported by ED physician   PAST MEDICAL HISTORY:   Past Medical History:  Diagnosis Date  . Asthma   . Cancer ([place])   . Colon cancer ([place])   . COPD (chronic obstructive pulmonary disease) ([place])   . Diabetes mellitus without complication ([place])   . Difficulty sleeping   . Dysrhythmia   . GERD (gastroesophageal reflux disease)   . Glaucoma   . History of bronchitis   . Hypertension   . Lung cancer ([place])    right upper lobe mass positive for adenocarcinoma  . Lymphoma ([facility])    low grade  . Renal cancer ([place])   . Respiratory failure ([place]) 07/12/2010   acute  . Right renal mass   . Shortness of breath dyspnea   . Stroke ([place]) 1990'S   NO RESIDUAL PROBLEMS  . Supplemental oxygen dependent    USES 3 LITERS CONTINUOUSLY    PAST SURGICAL HISTOIRY:  History reviewed. No pertinent surgical history.  SOCIAL HISTORY:   Social History  Substance Use Topics  . Smoking status: Former Smoker    Packs/day: 1.00    Years: 30.00    Types:  Cigarettes    Start date: 12/28/1968    Quit date: 01/13/2010  . Smokeless tobacco: Never Used  . Alcohol use No     Comment: Stopped 2001    FAMILY HISTORY:   Family History  Problem Relation Age of Onset  . Diabetes Mellitus II Mother   . Heart disease Father   . Hypertension Father   . Diabetes Mellitus II Sister   . Prostate cancer Brother     DRUG ALLERGIES:   Allergies  Allergen Reactions  . Iodinated Diagnostic Agents Shortness Of Breath and Nausea And Vomiting    13-hour prep  . Nexium [Esomeprazole Magnesium] Other (See Comments)    headache    REVIEW OF SYSTEMS:  CONSTITUTIONAL: No fever, fatigue or weakness.  EYES: No blurred or double vision.  EARS, NOSE, AND THROAT: No tinnitus or ear pain.  RESPIRATORY: No cough,   reporting shortness of breath, wheezing,   denies hemoptysis.  CARDIOVASCULAR: No chest pain, orthopnea, edema.  GASTROINTESTINAL: No nausea, vomiting, diarrhea or abdominal pain.  GENITOURINARY: No dysuria, hematuria.  ENDOCRINE: No polyuria, nocturia,  HEMATOLOGY: No anemia, easy bruising or bleeding SKIN: No rash or lesion. MUSCULOSKELETAL: No joint pain or arthritis.   NEUROLOGIC: No tingling, numbness, weakness.  PSYCHIATRY: No anxiety or depression.   MEDICATIONS AT HOME:   Prior to Admission medications   Medication Sig Start Date End Date Taking? Authorizing Provider  albuterol (  PROVENTIL HFA;VENTOLIN HFA) 108 (90 BASE) MCG/ACT inhaler Inhale 2 puffs into the lungs every 6 (six) hours as needed for wheezing or shortness of breath.    Yes Historical Provider, MD  albuterol (PROVENTIL) (2.5 MG/3ML) 0.083% nebulizer solution USE 1 VIAL IN NEBULIZER EVERY 3-4 HOURS AS NEEDED Patient taking differently: USE 1 VIAL IN NEBULIZER EVERY 3-4 HOURS AS NEEDED FOR SHORTNESS OF BREATH OR WHEEZING. 07/05/15  Yes Forest Gleason, MD  budesonide-formoterol (SYMBICORT) 160-4.5 MCG/ACT inhaler Inhale 2 puffs into the lungs 2 (two) times daily.    Yes  Historical Provider, MD  diphenhydrAMINE (BENADRYL) 25 MG tablet Take 2 tablets (50 mg total) by mouth as directed. 04/12/16  Yes Aletta Edouard, MD  glipiZIDE (GLUCOTROL) 10 MG tablet TAKE 1 TABLET BY MOUTH DAILY 08/16/15  Yes Forest Gleason, MD  HYDROcodone-acetaminophen (NORCO/VICODIN) 5-325 MG tablet Take 1 tablet by mouth every 6 (six) hours as needed for moderate pain. Please Note new directions 05/22/16  Yes [name], MD  loperamide (IMODIUM A-D) 2 MG tablet Take 2 mg by mouth 4 (four) times daily as needed for diarrhea or loose stools. Reported on 12/21/2015   Yes Historical Provider, MD  losartan-hydrochlorothiazide (HYZAAR) 50-12.5 MG per tablet Take 1 tablet by mouth daily.   Yes Historical Provider, MD  pioglitazone (ACTOS) 30 MG tablet Take 30 mg by mouth daily.   Yes Historical Provider, MD  predniSONE (DELTASONE) 10 MG tablet TAKE 1 TABLET (10 MG TOTAL) BY MOUTH DAILY WITH BREAKFAST. 03/05/16  Yes Forest Gleason, MD  temazepam (RESTORIL) 7.5 MG capsule Take 7.5-15 mg by mouth at bedtime as needed for sleep. Reported on 12/21/2015 05/02/15  Yes Historical Provider, MD  theophylline (UNIPHYL) 400 MG 24 hr tablet Take 400 mg by mouth daily as needed (For breathing.).  11/11/15  Yes Historical Provider, MD  tiotropium (SPIRIVA) 18 MCG inhalation capsule Place 18 mcg into inhaler and inhale daily.    Yes Historical Provider, MD  diphenhydrAMINE (BENADRYL) 12.5 MG chewable tablet Chew 25 mg by mouth daily as needed for allergies.     Historical Provider, MD  docusate sodium (COLACE) 100 MG capsule Take 1 capsule (100 mg total) by mouth 2 (two) times daily. 03/01/16   [name], PA-C  predniSONE (DELTASONE) 50 MG tablet Take 1 tablet (50 mg total) by mouth as directed. Prednisone 50 mg po 13 hrs, 7 hrs and 1 hr prior to CT.   (Benadryl 50 mg po 1 hr prior to CT). Patient not taking: Reported on 05/31/2016 04/12/16   Aletta Edouard, MD      VITAL SIGNS:  Blood pressure (!) 150/72, pulse (!)  110, temperature 98.2 F (36.8 C), temperature source Axillary, resp. rate 20, height '5\' 8"'$  (1.727 m), weight 69.9 kg (154 lb), SpO2 100 %.  PHYSICAL EXAMINATION:  GENERAL:  65 y.o.-year-old patient lying in the bed with no acute distress.  EYES: Pupils equal, round, reactive to light and accommodation. No scleral icterus. Extraocular muscles intact.  HEENT: Head atraumatic, normocephalic. Oropharynx and nasopharynx clear.  NECK:  Supple, no jugular venous distention. No thyroid enlargement, no tenderness.  LUNGS: Diminished  breath sounds bilaterally, minimal diffuse wheezing, no rales,rhonchi or crepitation. No use of accessory muscles of respiration.  CARDIOVASCULAR: S1, S2 normal. No murmurs, rubs, or gallops.  ABDOMEN: Soft, nontender, nondistended. Bowel sounds present. No organomegaly or mass.  EXTREMITIES: No pedal edema, cyanosis, or clubbing.  NEUROLOGIC: Cranial nerves II through XII are intact. Muscle strength 5/5 in all extremities. Sensation  intact. Gait not checked.  PSYCHIATRIC: The patient is alert and oriented x 3.  SKIN: No obvious rash, lesion, or ulcer.   LABORATORY PANEL:   CBC  Recent Labs Lab 05/31/16 1438  WBC 8.3  HGB 9.7*  HCT 29.4*  PLT 143*   ------------------------------------------------------------------------------------------------------------------  Chemistries   Recent Labs Lab 05/31/16 1358  NA 142  K 3.7  CL 101  CO2 33*  GLUCOSE 108*  BUN 25*  CREATININE 1.36*  CALCIUM 9.2   ------------------------------------------------------------------------------------------------------------------  Cardiac Enzymes  Recent Labs Lab 05/31/16 1358  TROPONINI <0.03   ------------------------------------------------------------------------------------------------------------------  RADIOLOGY:  Dg Chest 1 View  Result Date: 05/31/2016 CLINICAL DATA:  Shortness of breath, asthma, COPD, diabetes mellitus, stroke, cancer patient (lung  cancer, renal cancer, lymphoma, colon cancer) EXAM: CHEST 1 VIEW COMPARISON:  Portable exam 1336 hours compared to 05/09/2016 FINDINGS: LEFT subclavian Port-A-Cath with tip projecting over SVC. Normal heart size, mediastinal contours, and pulmonary vascularity. Emphysematous and mild bronchitic changes compatible with COPD. Linear scarring RIGHT upper lobe. Slight chronic accentuation of LEFT basilar markings unchanged. Remaining lungs clear. No pleural effusion or pneumothorax. Bones demineralized with old BILATERAL rib fractures. IMPRESSION: COPD changes with RIGHT upper lobe scarring. No acute infiltrate. Electronically Signed   By: Lavonia Dana M.D.   On: 05/31/2016 14:01   Nm Pet Image Restag (ps) Skull Base To Thigh  Result Date: 05/30/2016 CLINICAL DATA:  Subsequent treatment strategy for lung cancer. EXAM: NUCLEAR MEDICINE PET SKULL BASE TO THIGH TECHNIQUE: 12.7 mCi F-18 FDG was injected intravenously. Full-ring PET imaging was performed from the skull base to thigh after the radiotracer. CT data was obtained and used for attenuation correction and anatomic localization. FASTING BLOOD GLUCOSE:  Value: 144 mg/dl COMPARISON:  CT abdomen pelvis 05/16/2016, MR abdomen 01/17/2016 at 15:17. FINDINGS: NECK No hypermetabolic lymph nodes in the neck. CT images show no acute findings. CHEST No hypermetabolic mediastinal, hilar or axillary lymph nodes. No hypermetabolic pulmonary nodules. CT images show a diverticulum adjacent to the right aspect of the upper esophagus, with an air debris level, as on 11/30/2015. Left IJ Port-A-Cath terminates in the SVC. Coronary artery calcification. No pericardial or pleural effusion. Post radiation changes in the posteromedial right hemi thorax. ABDOMEN/PELVIS No abnormal hypermetabolism in the liver, adrenal glands, spleen or pancreas. Porta hepatis, abdominal peritoneal ligament and retroperitoneal adenopathy does not show abnormal hypermetabolism. Liver is unremarkable. Small  stones are seen in the gallbladder. Adrenal glands are unremarkable. Low-attenuation lesions in the kidneys measure up to 4.0 cm on the right. The largest lesion in the right kidney has been shown to represent renal cell carcinoma, as on prior imaging and biopsy. Spleen, pancreas, stomach and bowel are grossly unremarkable. Atherosclerotic calcification of the arterial vasculature. SKELETON No abnormal osseous hypermetabolism.  Old right rib fractures. IMPRESSION: 1. No evidence of recurrent or metastatic lung cancer. 2. Porta hepatis, abdominal peritoneal ligament and retroperitoneal adenopathy is not hypermetabolic. Adenopathy is better measured on diagnostic examination performed 05/16/2016. 3. Right renal cell carcinoma and prior ablation, better evaluated on prior imaging. 4. Upper esophageal diverticulum. 5. Aortic atherosclerosis and coronary artery calcification. 6. Cholelithiasis. Electronically Signed   By: Lorin Picket M.D.   On: 05/30/2016 13:34    EKG:   Orders placed or performed during the hospital encounter of 05/31/16  . ED EKG  . ED EKG    IMPRESSION AND PLAN:   Patient is presenting to emergency department with chief complaint of shortness of breath and cough. His portable  oxygen tank is malfunctioning  #Acute on chronic hypoxic respiratory failure secondary to acute exacerbation of COPD Admit to MedSurg unit Solu-Medrol 60 mg IV every 6 hours DuoNeb's nebs every 6 hours and albuterol every 4 hours as needed Azithromycin for anti-inflammatory effect Check theophylline levels Sputum culture and sensitivity ordered  #History of lung cancer stage III status post chemotherapy and radiation therapy currently under remission Outpatient follow-up with cancer Center is recommended  #History of diabetes mellitus Hold by mouth meds provide sliding scale insulin and diabetic diet  #Essential hypertension Continue home medication Hyzaar and titrate as needed  #GERD Provide  GI prophylaxis with Protonix  #DVT prophylaxis with Lovenox  All the records are reviewed and case discussed with ED provider. Management plans discussed with the patient, patient's girlfriend  and they are in agreement.  CODE STATUS: DNR / SISTER is theHCPOA  TOTAL TIME TAKING CARE OF THIS PATIENT: 45 minutes.   Note: This dictation was prepared with Dragon dictation along with smaller phrase technology. Any transcriptional errors that result from this process are unintentional.  Nicholes Mango M.D on 05/31/2016 at 5:16 PM  Between 7am to 6pm - Pager - [phone]  After 6pm go to [email or web address] - password [email or web address] [facility]  Office  [phone]  CC: Primary care physician; [name], MD

## 2016-05-31 NOTE — Progress Notes (Signed)
Patient ID: Craig Falco., male   DOB: 08-05-1951, 65 y.o.   MRN: [id number]   Referring Physician(s): Dr. Forest Gleason, retired.  Dr. Rogue Bussing is new oncologist  Chief Complaint: The patient is seen in follow up today s/p right renal cryoablation on 02/29/16  History of present illness: Craig [name]. is a 65 y.o. male status post percutaneous cryoablation of a biopsy-proven right clear cell renal carcinoma on 02/29/2016.  He is doing very well.  He has no complaints.  He had a CT scan on 05-16-16 for follow up as well as a PET scan yesterday ordered by his oncologist.  He presents today for his 3 month follow up from his cryoablation.   Past Medical History:  Diagnosis Date  . Asthma   . Cancer ([facility])   . Colon cancer ([place])   . COPD (chronic obstructive pulmonary disease) ([facility])   . Diabetes mellitus without complication ([place])   . Difficulty sleeping   . Dysrhythmia   . GERD (gastroesophageal reflux disease)   . Glaucoma   . History of bronchitis   . Hypertension   . Lung cancer ([facility])    right upper lobe mass positive for adenocarcinoma  . Lymphoma ([facility])    low grade  . Renal cancer ([place])   . Respiratory failure ([place]) 07/12/2010   acute  . Right renal mass   . Shortness of breath dyspnea   . Stroke ([place]) 1990'S   NO RESIDUAL PROBLEMS  . Supplemental oxygen dependent    USES 3 LITERS CONTINUOUSLY    History reviewed. No pertinent surgical history.  Allergies: Iodinated diagnostic agents and Nexium [esomeprazole magnesium]  Medications: Prior to Admission medications   Medication Sig Start Date End Date Taking? Authorizing Provider  albuterol (PROVENTIL HFA;VENTOLIN HFA) 108 (90 BASE) MCG/ACT inhaler Inhale 2 puffs into the lungs every 6 (six) hours as needed for wheezing or shortness of breath.    Yes Historical Provider, MD  albuterol (PROVENTIL) (2.5 MG/3ML) 0.083% nebulizer solution USE 1 VIAL IN NEBULIZER EVERY 3-4 HOURS AS NEEDED Patient taking differently:  USE 1 VIAL IN NEBULIZER EVERY 3-4 HOURS AS NEEDED FOR SHORTNESS OF BREATH OR WHEEZING. 07/05/15  Yes Forest Gleason, MD  budesonide-formoterol (SYMBICORT) 160-4.5 MCG/ACT inhaler Inhale 2 puffs into the lungs 2 (two) times daily.    Yes Historical Provider, MD  diphenhydrAMINE (BENADRYL) 12.5 MG chewable tablet Chew 25 mg by mouth daily as needed for allergies.    Yes Historical Provider, MD  diphenhydrAMINE (BENADRYL) 25 MG tablet Take 2 tablets (50 mg total) by mouth as directed. 04/12/16  Yes Aletta Edouard, MD  docusate sodium (COLACE) 100 MG capsule Take 1 capsule (100 mg total) by mouth 2 (two) times daily. 03/01/16  Yes [name], PA-C  glipiZIDE (GLUCOTROL) 10 MG tablet TAKE 1 TABLET BY MOUTH DAILY 08/16/15  Yes Forest Gleason, MD  HYDROcodone-acetaminophen (NORCO/VICODIN) 5-325 MG tablet Take 1 tablet by mouth every 6 (six) hours as needed for moderate pain. Please Note new directions 05/22/16  Yes [name], MD  loperamide (IMODIUM A-D) 2 MG tablet Take 2 mg by mouth 4 (four) times daily as needed for diarrhea or loose stools. Reported on 12/21/2015   Yes Historical Provider, MD  losartan-hydrochlorothiazide (HYZAAR) 50-12.5 MG per tablet Take 1 tablet by mouth daily.   Yes Historical Provider, MD  pioglitazone (ACTOS) 30 MG tablet Take 30 mg by mouth daily.   Yes Historical Provider, MD  predniSONE (DELTASONE) 10 MG tablet TAKE 1  TABLET (10 MG TOTAL) BY MOUTH DAILY WITH BREAKFAST. 03/05/16  Yes Forest Gleason, MD  temazepam (RESTORIL) 7.5 MG capsule Take 7.5-15 mg by mouth at bedtime as needed for sleep. Reported on 12/21/2015 05/02/15  Yes Historical Provider, MD  theophylline (UNIPHYL) 400 MG 24 hr tablet Take 400 mg by mouth daily as needed (For breathing.).  11/11/15  Yes Historical Provider, MD  tiotropium (SPIRIVA) 18 MCG inhalation capsule Place 18 mcg into inhaler and inhale daily.    Yes Historical Provider, MD  predniSONE (DELTASONE) 50 MG tablet Take 1 tablet (50 mg total) by mouth as  directed. Prednisone 50 mg po 13 hrs, 7 hrs and 1 hr prior to CT.   (Benadryl 50 mg po 1 hr prior to CT). Patient not taking: Reported on 05/31/2016 04/12/16   Aletta Edouard, MD     Family History  Problem Relation Age of Onset  . Diabetes Mellitus II Mother   . Heart disease Father   . Hypertension Father   . Diabetes Mellitus II Sister   . Prostate cancer Brother     Social History   Social History  . Marital status: Single    Spouse name: N/A  . Number of children: N/A  . Years of education: N/A   Social History Main Topics  . Smoking status: Former Smoker    Packs/day: 1.00    Years: 30.00    Types: Cigarettes    Start date: 12/28/1968    Quit date: 01/13/2010  . Smokeless tobacco: Never Used  . Alcohol use No     Comment: Stopped 2001  . Drug use: No  . Sexual activity: Not Asked   Other Topics Concern  . None   Social History Narrative  . None     Vital Signs: BP (!) 125/55 (BP Location: Right Arm, Patient Position: Sitting, Cuff Size: Normal)   Pulse (!) 111   Temp 98.3 F (36.8 C)   Resp 20   Ht '5\' 8"'$  (1.727 m)   Wt 154 lb (69.9 kg)   SpO2 97% Comment: O2 @ 3L/[place]  BMI 23.42 kg/m   Physical Exam Gen: pleasant black male who is in NAD Heart: regular Lungs: CTAB, with home O2 in place Abd: soft, NT, ND, +BS  Imaging: Nm Pet Image Restag (ps) Skull Base To Thigh  Result Date: 05/30/2016 CLINICAL DATA:  Subsequent treatment strategy for lung cancer. EXAM: NUCLEAR MEDICINE PET SKULL BASE TO THIGH TECHNIQUE: 12.7 mCi F-18 FDG was injected intravenously. Full-ring PET imaging was performed from the skull base to thigh after the radiotracer. CT data was obtained and used for attenuation correction and anatomic localization. FASTING BLOOD GLUCOSE:  Value: 144 mg/dl COMPARISON:  CT abdomen pelvis 05/16/2016, MR abdomen 01/17/2016 at 15:17. FINDINGS: NECK No hypermetabolic lymph nodes in the neck. CT images show no acute findings. CHEST No hypermetabolic  mediastinal, hilar or axillary lymph nodes. No hypermetabolic pulmonary nodules. CT images show a diverticulum adjacent to the right aspect of the upper esophagus, with an air debris level, as on 11/30/2015. Left IJ Port-A-Cath terminates in the SVC. Coronary artery calcification. No pericardial or pleural effusion. Post radiation changes in the posteromedial right hemi thorax. ABDOMEN/PELVIS No abnormal hypermetabolism in the liver, adrenal glands, spleen or pancreas. Porta hepatis, abdominal peritoneal ligament and retroperitoneal adenopathy does not show abnormal hypermetabolism. Liver is unremarkable. Small stones are seen in the gallbladder. Adrenal glands are unremarkable. Low-attenuation lesions in the kidneys measure up to 4.0 cm on the right. The  largest lesion in the right kidney has been shown to represent renal cell carcinoma, as on prior imaging and biopsy. Spleen, pancreas, stomach and bowel are grossly unremarkable. Atherosclerotic calcification of the arterial vasculature. SKELETON No abnormal osseous hypermetabolism.  Old right rib fractures. IMPRESSION: 1. No evidence of recurrent or metastatic lung cancer. 2. Porta hepatis, abdominal peritoneal ligament and retroperitoneal adenopathy is not hypermetabolic. Adenopathy is better measured on diagnostic examination performed 05/16/2016. 3. Right renal cell carcinoma and prior ablation, better evaluated on prior imaging. 4. Upper esophageal diverticulum. 5. Aortic atherosclerosis and coronary artery calcification. 6. Cholelithiasis. Electronically Signed   By: Lorin Picket M.D.   On: 05/30/2016 13:34    Labs:  CBC:  Recent Labs  02/03/16 1153 02/29/16 1030 03/01/16 0334 03/07/16 1433  WBC 8.2 8.5 13.9* 11.7*  HGB 9.7* 9.5* 9.4* 9.1*  HCT 30.8* 29.2* 29.2* 27.8*  PLT 255 159 173 260    COAGS:  Recent Labs  02/03/16 1153 02/29/16 1030  INR 1.06 0.98  APTT 30 30    BMP:  Recent Labs  02/29/16 1030 03/01/16 0334  03/07/16 1433 04/04/16 1047 05/16/16 1140  NA 137 139 141 141  --   K 4.7 4.8 3.5 3.5  --   CL 98* 100* 100* 96*  --   CO2 33* 33* 34* 36*  --   GLUCOSE 150* 131* 197* 151*  --   BUN 26* 28* 25* 20  --   CALCIUM 8.9 8.6* 9.0 9.3  --   CREATININE 1.30* 1.38* 1.31* 1.30* 1.30*  GFRNONAA 56* 52* 56* 56*  --   GFRAA >60 >60 >60 >60  --     LIVER FUNCTION TESTS:  Recent Labs  11/19/15 1600 11/20/15 0649 12/07/15 1112 03/07/16 1433  BILITOT 0.3 0.4 0.4 0.2*  AST 17 16 13* 15  ALT 14* 13* 15* 13*  ALKPHOS 81 64 58 62  PROT 8.0 7.1 6.7 6.9  ALBUMIN 3.9 3.4* 3.7 3.6    Assessment:  Status post percutaneous cryoablation of a biopsy-proven right clear cell renal carcinoma on 02/29/2016  The patient is doing very well.  His CT scan shows good results from his ablation.  He also had a PET that does not have any evidence of hypermetabolic activity anywhere.  His results are so good as this time, that we will plan on seeing him in follow up at 12 months post ablation.  No further imaging is needed until this follow up.  He will need his creatinine checked prior to his CT scan of the abd/pel with contrast in May of 2018 and follow up with Dr. Kathlene Cote after that scan.  Signed: Dasan [name] E 05/31/2016, 11:44 AM   Please refer to Dr. Margaretmary Dys attestation of this note for management and plan.

## 2016-05-31 NOTE — ED Provider Notes (Signed)
[facility] Emergency Department Provider Note   ____________________________________________   None    (approximate)  I have reviewed the triage vital signs and the nursing notes.   HISTORY  Chief Complaint Shortness of Breath   HPI Craig [name]. is a 65 y.o. male with a history of COPD on 3 L baseline home O2 with a right upper lobe lung mass was presenting to the emergency department with shortness of breath. He says that his portable oxygen started to malfunction and then he became short of breath. He denies any pain. He was started on albuterol by EMS who gave him a total of 5 mg. Prior to malfunction of his oxygen he denied feeling ill.   Past Medical History:  Diagnosis Date  . Asthma   . Cancer ([place])   . Colon cancer ([place])   . COPD (chronic obstructive pulmonary disease) ([place])   . Diabetes mellitus without complication ([place])   . Difficulty sleeping   . Dysrhythmia   . GERD (gastroesophageal reflux disease)   . Glaucoma   . History of bronchitis   . Hypertension   . Lung cancer (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 Lymphoma (H[facility])    low grade  . Renal cancer ([place])   . Respiratory failure ([place]) 07/12/2010   acute  . Right renal mass   . Shortness of breath dyspnea   . Stroke ([place]) 1990'S   NO RESIDUAL PROBLEMS  . Supplemental oxygen dependent    USES 3 LITERS CONTINUOUSLY    Patient Active Problem List   Diagnosis Date Noted  . Renal carcinoma ([place]) 02/29/2016  . Cancer of lung ([place]) 12/21/2015  . COPD exacerbation ([place]) 11/19/2015  . Acute bronchitis 11/19/2015  . SOB (shortness of breath) 11/19/2015  . Acute respiratory distress (HCC) 11/19/2015  . Glaucoma 04/15/2015  . 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 BP (high blood pressure) 03/02/2015  . Cannot sleep 03/02/2015  . Controlled type 2 diabetes mellitus without complication ([place]) 84/69/6295  . Lung cancer, upper lobe ([place]) 02/03/2015  . Renal cell  cancer ([place]) 02/03/2015  . 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 No pertinent surgical history.  Prior to Admission medications   Medication Sig Start Date End Date Taking? Authorizing Provider  albuterol (PROVENTIL HFA;VENTOLIN HFA) 108 (90 BASE) MCG/ACT inhaler Inhale 2 puffs into the lungs every 6 (six) hours as needed for wheezing or shortness of breath.     Historical Provider, MD  albuterol (PROVENTIL) (2.5 MG/3ML) 0.083% nebulizer solution USE 1 VIAL IN NEBULIZER EVERY 3-4 HOURS AS NEEDED Patient taking differently: USE 1 VIAL IN NEBULIZER EVERY 3-4 HOURS AS NEEDED FOR SHORTNESS OF BREATH OR WHEEZING. 07/05/15   Forest Gleason, MD  budesonide-formoterol (SYMBICORT) 160-4.5 MCG/ACT inhaler Inhale 2 puffs into the lungs 2 (two) times daily.     Historical Provider, MD  diphenhydrAMINE (BENADRYL) 12.5 MG chewable tablet Chew 25 mg by mouth daily as needed for allergies.     Historical Provider, MD  diphenhydrAMINE (BENADRYL) 25 MG tablet Take 2 tablets (50 mg total) by mouth as directed. 04/12/16   Aletta Edouard, MD  docusate sodium (COLACE) 100 MG capsule Take 1 capsule (100 mg total) by mouth 2 (two) times daily. 03/01/16   [name], PA-C  glipiZIDE (GLUCOTROL) 10 MG tablet TAKE 1 TABLET BY MOUTH DAILY 08/16/15   Forest Gleason, MD  HYDROcodone-acetaminophen (NORCO/VICODIN) 5-325 MG tablet Take 1 tablet by mouth  every 6 (six) hours as needed for moderate pain. Please Note new directions 05/22/16   [name], MD  loperamide (IMODIUM A-D) 2 MG tablet Take 2 mg by mouth 4 (four) times daily as needed for diarrhea or loose stools. Reported on 12/21/2015    Historical Provider, MD  losartan-hydrochlorothiazide (HYZAAR) 50-12.5 MG per tablet Take 1 tablet by mouth daily.    Historical Provider, MD  pioglitazone (ACTOS) 30 MG tablet Take 30 mg by mouth daily.    Historical Provider, MD  predniSONE (DELTASONE) 10 MG tablet TAKE 1 TABLET (10 MG TOTAL) BY MOUTH DAILY  WITH BREAKFAST. 03/05/16   Forest Gleason, MD  predniSONE (DELTASONE) 50 MG tablet Take 1 tablet (50 mg total) by mouth as directed. Prednisone 50 mg po 13 hrs, 7 hrs and 1 hr prior to CT.   (Benadryl 50 mg po 1 hr prior to CT). Patient not taking: Reported on 05/31/2016 04/12/16   Aletta Edouard, MD  temazepam (RESTORIL) 7.5 MG capsule Take 7.5-15 mg by mouth at bedtime as needed for sleep. Reported on 12/21/2015 05/02/15   Historical Provider, MD  theophylline (UNIPHYL) 400 MG 24 hr tablet Take 400 mg by mouth daily as needed (For breathing.).  11/11/15   Historical Provider, MD  tiotropium (SPIRIVA) 18 MCG inhalation capsule Place 18 mcg into inhaler and inhale daily.     Historical Provider, MD    Allergies Iodinated diagnostic agents and Nexium [esomeprazole magnesium]  Family History  Problem Relation Age of Onset  . Diabetes Mellitus II Mother   . Heart disease Father   . Hypertension Father   . Diabetes Mellitus II Sister   . Prostate cancer Brother     Social History Social History  Substance Use Topics  . Smoking status: Former Smoker    Packs/day: 1.00    Years: 30.00    Types: Cigarettes    Start date: 12/28/1968    Quit date: 01/13/2010  . Smokeless tobacco: Never Used  . Alcohol use No     Comment: Stopped 2001    Review of Systems Constitutional: No fever/chills Eyes: No visual changes. ENT: No sore throat. Cardiovascular: Denies chest pain. Respiratory: As above Gastrointestinal: No abdominal pain.  No nausea, no vomiting. No constipation. Genitourinary: Negative for dysuria. Musculoskeletal: Negative for back pain. Skin: Negative for rash. Neurological: Negative for headaches, focal weakness or numbness.  10-point ROS otherwise negative.  ____________________________________________   PHYSICAL EXAM:  VITAL SIGNS: ED Triage Vitals [05/31/16 1327]  Enc Vitals Group     BP 122/90     Pulse Rate (!) 122     Resp (!) 28     Temp 98.2 F (36.8 C)     Temp  Source Axillary     SpO2 100 %     Weight      Height      Head Circumference      Peak Flow      Pain Score      Pain Loc      Pain Edu?      Excl. in [place]?     Constitutional: Alert and oriented. Patient using accessory muscles to breathe. Eyes: Conjunctivae are normal. PERRL. EOMI. Head: Atraumatic. Nose: No congestion/rhinnorhea. Mouth/Throat: Mucous membranes are moist.  Oropharynx non-erythematous. Neck: No stridor.   Cardiovascular: Tachycardic, regular rhythm. Grossly normal heart sounds.  Good peripheral circulation. Respiratory: Tachypneic with increased respiratory effort. Using accessory muscles with suprasternal retractions. Wheezing throughout with prolonged expiratory phase and decreased  air movement. Gastrointestinal: Soft and nontender. No distention.  No CVA tenderness. Musculoskeletal: No lower extremity tenderness nor edema.  No joint effusions. Neurologic:  Normal speech and language. No gross focal neurologic deficits are appreciated.  Skin:  Skin is warm, dry and intact. No rash noted. Psychiatric: Mood and affect are normal. Speech and behavior are normal.  ____________________________________________   LABS (all labs ordered are listed, but only abnormal results are displayed)  Labs Reviewed  BASIC METABOLIC PANEL - Abnormal; Notable for the following:       Result Value   CO2 33 (*)    Glucose, Bld 108 (*)    BUN 25 (*)    Creatinine, Ser 1.36 (*)    GFR calc non Af Amer 53 (*)    All other components within normal limits  CBC WITH DIFFERENTIAL/PLATELET - Abnormal; Notable for the following:    RBC 3.50 (*)    Hemoglobin 9.7 (*)    HCT 29.4 (*)    RDW 15.5 (*)    Platelets 143 (*)    All other components within normal limits  TROPONIN I  CBC WITH DIFFERENTIAL/PLATELET   ____________________________________________  EKG  ED ECG REPORT I, Doran Stabler, the attending physician, personally viewed and interpreted this ECG.   Date:  05/31/2016  EKG Time: 1407  Rate: 119  Rhythm: sinus tachycardia  Axis: Normal axis  Intervals:none  ST&T Change: No ST segment elevation or depression but no abnormal T-wave inversion.  ____________________________________________  RADIOLOGY  DG Chest 1 View (Accession [id number]) (Order [id number])  Imaging  Date: 05/31/2016 Department: [facility] EMERGENCY DEPARTMENT Released By/Authorizing: Orbie Pyo, MD (auto-released)  PACS Images   Show images for DG Chest 1 View  Study Result   CLINICAL DATA:  Shortness of breath, asthma, COPD, diabetes mellitus, stroke, cancer patient (lung cancer, renal cancer, lymphoma, colon cancer)  EXAM: CHEST 1 VIEW  COMPARISON:  Portable exam 1336 hours compared to 05/09/2016  FINDINGS: LEFT subclavian Port-A-Cath with tip projecting over SVC.  Normal heart size, mediastinal contours, and pulmonary vascularity.  Emphysematous and mild bronchitic changes compatible with COPD.  Linear scarring RIGHT upper lobe.  Slight chronic accentuation of LEFT basilar markings unchanged.  Remaining lungs clear.  No pleural effusion or pneumothorax.  Bones demineralized with old BILATERAL rib fractures.  IMPRESSION: COPD changes with RIGHT upper lobe scarring.  No acute infiltrate.   Electronically Signed   By: Lavonia Dana M.D.   On: 05/31/2016 14:01    ____________________________________________   PROCEDURES  Procedure(s) performed:   Procedures  Critical Care performed:   ____________________________________________   INITIAL IMPRESSION / ASSESSMENT AND PLAN / ED COURSE  Pertinent labs & imaging results that were available during my care of the patient were reviewed by me and considered in my medical decision making (see chart for details).  ----------------------------------------- 4:27 PM on 05/31/2016 -----------------------------------------  Patient with persistent  wheezing. Now speaking in full sentences. Appears much more calm after treatment for his COPD but still with mild retractions. Will be admitted to the hospital for further treatment. This is a patient and he is understanding of this plan and willing to comply. Signed out to Dr. Margaretmary Eddy.    Clinical Course     ____________________________________________   FINAL CLINICAL IMPRESSION(S) / ED DIAGNOSES  COPD exacerbation    NEW MEDICATIONS STARTED DURING THIS VISIT:  New Prescriptions   No medications on file     Note:  This document was prepared  using Systems analyst and may include unintentional dictation errors.    Orbie Pyo, MD 05/31/16 [phone]

## 2016-05-31 NOTE — ED Notes (Signed)
Patient changed from NRB to 5L O2 via [place].

## 2016-06-01 ENCOUNTER — Inpatient Hospital Stay: Payer: Commercial Managed Care - HMO | Admitting: Internal Medicine

## 2016-06-01 ENCOUNTER — Inpatient Hospital Stay: Payer: Commercial Managed Care - HMO

## 2016-06-01 DIAGNOSIS — C3411 Malignant neoplasm of upper lobe, right bronchus or lung: Secondary | ICD-10-CM | POA: Insufficient documentation

## 2016-06-01 LAB — COMPREHENSIVE METABOLIC PANEL
ALT: 11 U/L — ABNORMAL LOW (ref 17–63)
AST: 18 U/L (ref 15–41)
Albumin: 3.8 g/dL (ref 3.5–5.0)
Alkaline Phosphatase: 73 U/L (ref 38–126)
Anion gap: 7 (ref 5–15)
BUN: 27 mg/dL — ABNORMAL HIGH (ref 6–20)
CHLORIDE: 98 mmol/L — AB (ref 101–111)
CO2: 32 mmol/L (ref 22–32)
Calcium: 8.9 mg/dL (ref 8.9–10.3)
Creatinine, Ser: 1.31 mg/dL — ABNORMAL HIGH (ref 0.61–1.24)
GFR, EST NON AFRICAN AMERICAN: 56 mL/min — AB (ref >60)
Glucose, Bld: 203 mg/dL — ABNORMAL HIGH (ref 65–99)
POTASSIUM: 4.8 mmol/L (ref 3.5–5.1)
Sodium: 137 mmol/L (ref 135–145)
TOTAL PROTEIN: 6.5 g/dL (ref 6.5–8.1)
Total Bilirubin: 0.5 mg/dL (ref 0.3–1.2)

## 2016-06-01 LAB — BASIC METABOLIC PANEL
Anion gap: 8 (ref 5–15)
BUN: 25 mg/dL — AB (ref 6–20)
CHLORIDE: 101 mmol/L (ref 101–111)
CO2: 33 mmol/L — ABNORMAL HIGH (ref 22–32)
Calcium: 9.2 mg/dL (ref 8.9–10.3)
Creatinine, Ser: 1.36 mg/dL — ABNORMAL HIGH (ref 0.61–1.24)
GFR calc Af Amer: >60 mL/min (ref >60)
GFR, EST NON AFRICAN AMERICAN: 53 mL/min — AB (ref >60)
GLUCOSE: 108 mg/dL — AB (ref 65–99)
POTASSIUM: 3.7 mmol/L (ref 3.5–5.1)
Sodium: 142 mmol/L (ref 135–145)

## 2016-06-01 LAB — CBC
HEMATOCRIT: 26.1 % — AB (ref 40.0–52.0)
Hemoglobin: 8.6 g/dL — ABNORMAL LOW (ref 13.0–18.0)
MCH: 27.4 pg (ref 26.0–34.0)
MCHC: 32.9 g/dL (ref 32.0–36.0)
MCV: 83.2 fL (ref 80.0–100.0)
PLATELETS: 160 10*3/uL (ref 150–440)
RBC: 3.14 MIL/uL — AB (ref 4.40–5.90)
RDW: 15.8 % — ABNORMAL HIGH (ref 11.5–14.5)
WBC: 8.6 10*3/uL (ref 3.8–10.6)

## 2016-06-01 LAB — GLUCOSE, CAPILLARY
GLUCOSE-CAPILLARY: 181 mg/dL — AB (ref 65–99)
GLUCOSE-CAPILLARY: 120 mg/dL — AB (ref 65–99)
GLUCOSE-CAPILLARY: 150 mg/dL — AB (ref 65–99)
Glucose-Capillary: 100 mg/dL — ABNORMAL HIGH (ref 65–99)

## 2016-06-01 LAB — HEMOGLOBIN A1C: HEMOGLOBIN A1C: 6.9 % — AB (ref 4.0–6.0)

## 2016-06-01 LAB — THEOPHYLLINE LEVEL: Theophylline Lvl: 2.5 — ABNORMAL LOW (ref 8.0–20.0)

## 2016-06-01 MED ORDER — DEXTROMETHORPHAN POLISTIREX ER 30 MG/5ML PO SUER
30.0000 mg | Freq: Two times a day (BID) | ORAL | Status: DC
  Administered 2016-06-01 – 2016-06-02 (×3): 30 mg via ORAL
  Filled 2016-06-01 (×4): qty 5

## 2016-06-01 MED ORDER — ALBUTEROL SULFATE (2.5 MG/3ML) 0.083% IN NEBU
2.5000 mg | INHALATION_SOLUTION | Freq: Four times a day (QID) | RESPIRATORY_TRACT | Status: DC
  Administered 2016-06-01 – 2016-06-02 (×4): 2.5 mg via RESPIRATORY_TRACT
  Filled 2016-06-01 (×5): qty 3

## 2016-06-01 MED ORDER — GUAIFENESIN ER 600 MG PO TB12
600.0000 mg | ORAL_TABLET | Freq: Two times a day (BID) | ORAL | Status: DC
  Administered 2016-06-01 – 2016-06-02 (×3): 600 mg via ORAL
  Filled 2016-06-01 (×3): qty 1

## 2016-06-01 MED ORDER — DM-GUAIFENESIN ER 30-600 MG PO TB12: 1.0000 | ORAL_TABLET | Freq: Two times a day (BID) | ORAL | Status: DC

## 2016-06-01 MED ORDER — METHYLPREDNISOLONE SODIUM SUCC 125 MG IJ SOLR
60.0000 mg | Freq: Every day | INTRAMUSCULAR | Status: DC
  Administered 2016-06-02: 60 mg via INTRAVENOUS
  Filled 2016-06-01: qty 2

## 2016-06-01 MED ORDER — INSULIN ASPART 100 UNIT/ML ~~LOC~~ SOLN: 3.0000 [IU] | Freq: Three times a day (TID) | SUBCUTANEOUS | Status: DC

## 2016-06-01 NOTE — Care Management Important Message (Signed)
Important Message  Patient Details  Name: Craig [name]. MRN: [id number] Date of Birth: [birth year]   Medicare Important Message Given:  Yes    Shelbie Ammons, RN 06/01/2016, 8:20 AM

## 2016-06-01 NOTE — Assessment & Plan Note (Deleted)
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 Stage III] no evidence of recurrence  # RCC/ right- s/p cryo

## 2016-06-01 NOTE — Progress Notes (Signed)
Inpatient Diabetes Program Recommendations  AACE/ADA: New Consensus Statement on Inpatient Glycemic Control (2015)  Target Ranges:  Prepandial:   less than 140 mg/dL      Peak postprandial:   less than 180 mg/dL (1-2 hours)      Critically ill patients:  140 - 180 mg/dL   Lab Results  Component Value Date   GLUCAP 181 (H) 06/01/2016   HGBA1C 6.9 (H) 02/03/2016    Review of Glycemic Control:  Results for Craig [name], Craig [name] (MRN [id number]) as of 06/01/2016 08:40  Ref. Range 05/30/2016 08:19 05/31/2016 18:52 05/31/2016 21:02 06/01/2016 07:22  Glucose-Capillary Latest Ref Range: 65 - 99 mg/dL 144 (H) 253 (H) 276 (H) 181 (H)    Diabetes history:  Type 2 diabetes Outpatient Diabetes medications: Glipizide 10 mg daily, Actos 30 mg daily Current orders for Inpatient glycemic control: Novolog moderate tid with meals and HS  Inpatient Diabetes Program Recommendations:   While on steroids, may consider adding Novolog meal coverage 3 units tid with meals (to be held if patient eats less than 50%).  Thanks, Adah Perl, RN, BC-ADM Inpatient Diabetes Coordinator Pager [phone] (8a-5p)

## 2016-06-01 NOTE — Care Management (Signed)
Admitted to [facility] with the diagnosis of COPD. Lives alone. Sister is Katharine Look [phone]). Prescriptions are filled at [facility] on [address]. No falls. Good appetite. Takes care of all basic activities of daily living himself, doesn't drive. Friends/family helps with errands. Home oxygen x 4 years through [employer]. 3 liters per nasal cannula continuous. Uses no aids for ambulation. No skilled facility. No home Health in the past. Friend will transport. Shelbie Ammons RN MSN CCM Care Management [phone]

## 2016-06-01 NOTE — Progress Notes (Deleted)
Craig [name] OFFICE PROGRESS NOTE  Patient Care Team: [name], MD as PCP - General (Family Medicine)  Lung cancer, upper lobe [facility])   Staging form: Lung, AJCC 7th Edition   - Clinical stage from 06/15/2010: yT2, N2, M0 - Signed by [name], NP on 02/03/2015   - Pathologic stage from 04/04/2014: yT2, N2, M1 - Signed by [name], NP on 02/03/2015  Lymphoma, small lymphocytic ([place])   Staging form: Lymphoid Neoplasms, AJCC 6th Edition   - Clinical stage from 10/15/2013: Stage II - Signed by [name], NP on 02/03/2015  Renal cell cancer [facility])   Staging form: Kidney, AJCC 7th Edition   - Clinical stage from 02/03/2015: Stage I (yT1a, N0, M0) - Signed by [name], NP on 02/03/2015   Oncology History   1. Right upper lobe lung mass biopsies positive for adenocarcinoma.   Based on PET scan patient has T2, N2, M0 tumor stage III a 2. Poor pulmonary function 3. Patient was started on carboplatinum Alimta and Avastin   on april  9th of 2013 4. Patient had an allergic reaction to carboplatinum with acute bronchospasm in May of 2013.  Patient is now being taken off carboplatinum. 5. Started on maintenance Alimta and Avastin  from June of 2013 hold chemotherapy because of increasing shortness of breath (in July, 2013) 6.VARISTRAT test is good started on Tarceva February 3 about 2014 diarrhea 4 days after Tarceva was started.  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 9.bnormal CT and MRI scan of the right kidneywith solid massin the upper pole right kidney 10.biopsy of renal masses consistent with primary renal cell cancer 11.  Patient started on  [facility] August 09, 2014 12 NIVOLULAMAB has been put on hold from August of 2016 because of   PROGRESSIVE  respiratory difficulty     Lung cancer, upper lobe ([place])    02/03/2015 Initial Diagnosis    Lung cancer, upper lobe      This is my first interaction with the patient as patient's primary oncologist has been Dr.Choksi. I reviewed the patient's prior charts/pertinent labs/imaging in detail; findings are summarized above.     INTERVAL HISTORY:  Craig [name]. 65 y.o.  male pleasant patient above history of  REVIEW OF SYSTEMS:  A complete 10 point review of system is done which is negative except mentioned above/history of present illness.   PAST MEDICAL HISTORY :  Past Medical History:  Diagnosis Date  . Asthma   . Cancer ([place])   . Colon cancer ([place])   . COPD (chronic obstructive pulmonary disease) ([place])   . Diabetes mellitus without complication ([place])   . Difficulty sleeping   . Dysrhythmia   . GERD (gastroesophageal reflux disease)   . Glaucoma   . History of bronchitis   . Hypertension   . Lung cancer ([place])    right upper lobe mass positive for adenocarcinoma  . Lymphoma ([facility])    low grade  . Renal cancer ([place])   . Respiratory failure ([place]) 07/12/2010   acute  . Right renal mass   . Shortness of breath dyspnea   . Stroke ([place]) 1990'S   NO RESIDUAL PROBLEMS  . Supplemental oxygen dependent    USES 3 LITERS CONTINUOUSLY    PAST SURGICAL HISTORY :  No past surgical history on file.  FAMILY  HISTORY :   Family History  Problem Relation Age of Onset  . Diabetes Mellitus II Mother   . Heart disease Father   . Hypertension Father   . Diabetes Mellitus II Sister   . Prostate cancer Brother     SOCIAL HISTORY:   Social History  Substance Use Topics  . Smoking status: Former Smoker    Packs/day: 1.00    Years: 30.00    Types: Cigarettes    Start date: 12/28/1968    Quit date: 01/13/2010  . Smokeless tobacco: Never Used  . Alcohol use No     Comment: Stopped 2001    ALLERGIES:  is allergic to iodinated diagnostic agents and nexium [esomeprazole magnesium].  MEDICATIONS:  No current facility-administered medications  for this visit.    No current outpatient prescriptions on file.   Facility-Administered Medications Ordered in Other Visits  Medication Dose Route Frequency Provider Last Rate Last Dose  . acetaminophen (TYLENOL) tablet 650 mg  650 mg Oral Q6H PRN Nicholes Mango, MD       Or  . acetaminophen (TYLENOL) suppository 650 mg  650 mg Rectal Q6H PRN Aruna Gouru, MD      . albuterol (PROVENTIL) (2.5 MG/3ML) 0.083% nebulizer solution 2.5 mg  2.5 mg Nebulization Q4H PRN Nicholes Mango, MD      . azithromycin (ZITHROMAX) tablet 250 mg  250 mg Oral Daily Nicholes Mango, MD      . dextromethorphan-guaiFENesin (MUCINEX DM) 30-600 MG per 12 hr tablet 1 tablet  1 tablet Oral BID Lytle Butte, MD      . diphenhydrAMINE (BENADRYL) 12.5 MG/5ML elixir 25 mg  25 mg Oral Daily PRN Nicholes Mango, MD      . docusate sodium (COLACE) capsule 100 mg  100 mg Oral BID Aruna Gouru, MD      . enoxaparin (LOVENOX) injection 40 mg  40 mg Subcutaneous Q24H Nicholes Mango, MD   40 mg at 05/31/16 2019  . losartan (COZAAR) tablet 50 mg  50 mg Oral Daily Aruna Gouru, MD       And  . hydrochlorothiazide (MICROZIDE) capsule 12.5 mg  12.5 mg Oral Daily Aruna Gouru, MD      . HYDROcodone-acetaminophen (NORCO/VICODIN) 5-325 MG per tablet 1 tablet  1 tablet Oral Q6H PRN Nicholes Mango, MD   1 tablet at 05/31/16 2019  . insulin aspart (novoLOG) injection 0-15 Units  0-15 Units Subcutaneous TID WC Nicholes Mango, MD   3 Units at 06/01/16 0815  . insulin aspart (novoLOG) injection 0-5 Units  0-5 Units Subcutaneous QHS Nicholes Mango, MD   3 Units at 05/31/16 2125  . ipratropium-albuterol (DUONEB) 0.5-2.5 (3) MG/3ML nebulizer solution 3 mL  3 mL Nebulization Q6H Nicholes Mango, MD   3 mL at 06/01/16 0813  . loperamide (IMODIUM) capsule 2 mg  2 mg Oral QID PRN Nicholes Mango, MD      . Derrill Memo ON 06/02/2016] methylPREDNISolone sodium succinate (SOLU-MEDROL) 125 mg/2 mL injection 60 mg  60 mg Intravenous Daily Lytle Butte, MD      . ondansetron [facility]) tablet 4 mg  4  mg Oral Q6H PRN Nicholes Mango, MD       Or  . ondansetron (ZOFRAN) injection 4 mg  4 mg Intravenous Q6H PRN Aruna Gouru, MD      . sodium chloride 0.9 % injection 10 mL  10 mL Intracatheter PRN Forest Gleason, MD   10 mL at 04/28/15 1410  . sodium chloride 0.9 % injection 10  mL  10 mL Intravenous PRN Forest Gleason, MD   10 mL at 05/12/15 1350  . sodium chloride flush (NS) 0.9 % injection 3 mL  3 mL Intravenous Q12H Nicholes Mango, MD   3 mL at 05/31/16 2126  . temazepam (RESTORIL) capsule 7.5-15 mg  7.5-15 mg Oral QHS PRN Nicholes Mango, MD   15 mg at 06/01/16 0110  . theophylline (UNIPHYL) 400 MG 24 hr tablet 400 mg  400 mg Oral Q2000 Aruna Gouru, MD      . tiotropium (SPIRIVA) inhalation capsule 18 mcg  18 mcg Inhalation Daily Nicholes Mango, MD        PHYSICAL EXAMINATION: ECOG PERFORMANCE STATUS: {CHL ONC ECOG PS:[id number]}  There were no vitals taken for this visit.  There were no vitals filed for this visit.  GENERAL: Well-nourished well-developed; Alert, no distress and comfortable.   *** EYES: no pallor or icterus OROPHARYNX: no thrush or ulceration; good dentition  NECK: supple, no masses felt LYMPH:  no palpable lymphadenopathy in the cervical, axillary or inguinal regions LUNGS: clear to auscultation and  No wheeze or crackles HEART/CVS: regular rate & rhythm and no murmurs; No lower extremity edema ABDOMEN:abdomen soft, non-tender and normal bowel sounds Musculoskeletal:no cyanosis of digits and no clubbing  PSYCH: alert & oriented x 3 with fluent speech NEURO: no focal motor/sensory deficits SKIN:  no rashes or significant lesions  LABORATORY DATA:  I have reviewed the data as listed    Component Value Date/Time   NA 137 06/01/2016 0325   NA 139 01/17/2015 0913   K 4.8 06/01/2016 0325   K 3.8 01/17/2015 0913   CL 98 (L) 06/01/2016 0325   CL 99 (L) 01/17/2015 0913   CO2 32 06/01/2016 0325   CO2 35 (H) 01/17/2015 0913   GLUCOSE 203 (H) 06/01/2016 0325   GLUCOSE 169 (H)  01/17/2015 0913   BUN 27 (H) 06/01/2016 0325   BUN 15 01/17/2015 0913   CREATININE 1.31 (H) 06/01/2016 0325   CREATININE 1.23 01/17/2015 0913   CALCIUM 8.9 06/01/2016 0325   CALCIUM 8.8 (L) 01/17/2015 0913   PROT 6.5 06/01/2016 0325   PROT 6.6 01/17/2015 0913   ALBUMIN 3.8 06/01/2016 0325   ALBUMIN 3.7 01/17/2015 0913   AST 18 06/01/2016 0325   AST 20 01/17/2015 0913   ALT 11 (L) 06/01/2016 0325   ALT 15 (L) 01/17/2015 0913   ALKPHOS 73 06/01/2016 0325   ALKPHOS 76 01/17/2015 0913   BILITOT 0.5 06/01/2016 0325   BILITOT 0.5 01/17/2015 0913   GFRNONAA 56 (L) 06/01/2016 0325   GFRNONAA >60 01/17/2015 0913   GFRAA >60 06/01/2016 0325   GFRAA >60 01/17/2015 0913    No results found for: SPEP, UPEP  Lab Results  Component Value Date   WBC 8.6 06/01/2016   NEUTROABS 4.5 05/31/2016   HGB 8.6 (L) 06/01/2016   HCT 26.1 (L) 06/01/2016   MCV 83.2 06/01/2016   PLT 160 06/01/2016      Chemistry      Component Value Date/Time   NA 137 06/01/2016 0325   NA 139 01/17/2015 0913   K 4.8 06/01/2016 0325   K 3.8 01/17/2015 0913   CL 98 (L) 06/01/2016 0325   CL 99 (L) 01/17/2015 0913   CO2 32 06/01/2016 0325   CO2 35 (H) 01/17/2015 0913   BUN 27 (H) 06/01/2016 0325   BUN 15 01/17/2015 0913   CREATININE 1.31 (H) 06/01/2016 0325   CREATININE 1.23 01/17/2015 0913  Component Value Date/Time   CALCIUM 8.9 06/01/2016 0325   CALCIUM 8.8 (L) 01/17/2015 0913   ALKPHOS 73 06/01/2016 0325   ALKPHOS 76 01/17/2015 0913   AST 18 06/01/2016 0325   AST 20 01/17/2015 0913   ALT 11 (L) 06/01/2016 0325   ALT 15 (L) 01/17/2015 0913   BILITOT 0.5 06/01/2016 0325   BILITOT 0.5 01/17/2015 0913       RADIOGRAPHIC STUDIES: I have personally reviewed the radiological images as listed and agreed with the findings in the report. Dg Chest 1 View  Result Date: 05/31/2016 CLINICAL DATA:  Shortness of breath, asthma, COPD, diabetes mellitus, stroke, cancer patient (lung cancer, renal cancer,  lymphoma, colon cancer) EXAM: CHEST 1 VIEW COMPARISON:  Portable exam 1336 hours compared to 05/09/2016 FINDINGS: LEFT subclavian Port-A-Cath with tip projecting over SVC. Normal heart size, mediastinal contours, and pulmonary vascularity. Emphysematous and mild bronchitic changes compatible with COPD. Linear scarring RIGHT upper lobe. Slight chronic accentuation of LEFT basilar markings unchanged. Remaining lungs clear. No pleural effusion or pneumothorax. Bones demineralized with old BILATERAL rib fractures. IMPRESSION: COPD changes with RIGHT upper lobe scarring. No acute infiltrate. Electronically Signed   By: Lavonia Dana M.D.   On: 05/31/2016 14:01   Nm Pet Image Restag (ps) Skull Base To Thigh  Result Date: 05/30/2016 CLINICAL DATA:  Subsequent treatment strategy for lung cancer. EXAM: NUCLEAR MEDICINE PET SKULL BASE TO THIGH TECHNIQUE: 12.7 mCi F-18 FDG was injected intravenously. Full-ring PET imaging was performed from the skull base to thigh after the radiotracer. CT data was obtained and used for attenuation correction and anatomic localization. FASTING BLOOD GLUCOSE:  Value: 144 mg/dl COMPARISON:  CT abdomen pelvis 05/16/2016, MR abdomen 01/17/2016 at 15:17. FINDINGS: NECK No hypermetabolic lymph nodes in the neck. CT images show no acute findings. CHEST No hypermetabolic mediastinal, hilar or axillary lymph nodes. No hypermetabolic pulmonary nodules. CT images show a diverticulum adjacent to the right aspect of the upper esophagus, with an air debris level, as on 11/30/2015. Left IJ Port-A-Cath terminates in the SVC. Coronary artery calcification. No pericardial or pleural effusion. Post radiation changes in the posteromedial right hemi thorax. ABDOMEN/PELVIS No abnormal hypermetabolism in the liver, adrenal glands, spleen or pancreas. Porta hepatis, abdominal peritoneal ligament and retroperitoneal adenopathy does not show abnormal hypermetabolism. Liver is unremarkable. Small stones are seen in  the gallbladder. Adrenal glands are unremarkable. Low-attenuation lesions in the kidneys measure up to 4.0 cm on the right. The largest lesion in the right kidney has been shown to represent renal cell carcinoma, as on prior imaging and biopsy. Spleen, pancreas, stomach and bowel are grossly unremarkable. Atherosclerotic calcification of the arterial vasculature. SKELETON No abnormal osseous hypermetabolism.  Old right rib fractures. IMPRESSION: 1. No evidence of recurrent or metastatic lung cancer. 2. Porta hepatis, abdominal peritoneal ligament and retroperitoneal adenopathy is not hypermetabolic. Adenopathy is better measured on diagnostic examination performed 05/16/2016. 3. Right renal cell carcinoma and prior ablation, better evaluated on prior imaging. 4. Upper esophageal diverticulum. 5. Aortic atherosclerosis and coronary artery calcification. 6. Cholelithiasis. Electronically Signed   By: Lorin Picket M.D.   On: 05/30/2016 13:34     ASSESSMENT & PLAN:  No problem-specific Assessment & Plan notes found for this encounter.   No orders of the defined types were placed in this encounter.  All questions were answered. The patient knows to call the clinic with any problems, questions or concerns.      [name], MD 06/01/2016 8:26 AM

## 2016-06-01 NOTE — Progress Notes (Signed)
[place] [facility] [place] NAME: Craig [name]    MRN#:  [id number]  DATE OF BIRTH:  1951-02-06  SUBJECTIVE:  Hospital Day: 1 day Craig [name] is a 65 y.o. male presenting with Shortness of Breath .   Overnight events: No acute overnight events Interval Events: States breathing somewhat improved this morning, still not at baseline  REVIEW OF SYSTEMS:  CONSTITUTIONAL: No fever, fatigue or weakness.  EYES: No blurred or double vision.  EARS, NOSE, AND THROAT: No tinnitus or ear pain.  RESPIRATORY: No cough, Positive shortness of breath, denies wheezing or hemoptysis.  CARDIOVASCULAR: No chest pain, orthopnea, edema.  GASTROINTESTINAL: No nausea, vomiting, diarrhea or abdominal pain.  GENITOURINARY: No dysuria, hematuria.  ENDOCRINE: No polyuria, nocturia,  HEMATOLOGY: No anemia, easy bruising or bleeding SKIN: No rash or lesion. MUSCULOSKELETAL: No joint pain or arthritis.   NEUROLOGIC: No tingling, numbness, weakness.  PSYCHIATRY: No anxiety or depression.   DRUG ALLERGIES:   Allergies  Allergen Reactions  . Iodinated Diagnostic Agents Shortness Of Breath and Nausea And Vomiting    13-hour prep  . Nexium [Esomeprazole Magnesium] Other (See Comments)    headache    VITALS:  Blood pressure 130/64, pulse (!) 114, temperature 98.1 F (36.7 C), temperature source Oral, resp. rate 17, height '5\' 8"'$  (1.727 m), weight 69.4 kg (152 lb 14.4 oz), SpO2 100 %.  PHYSICAL EXAMINATION:  VITAL SIGNS: Vitals:   05/31/16 2019 06/01/16 0819  BP: 132/66 130/64  Pulse: (!) 114   Resp: (!) 22 17  Temp: 98.7 F (37.1 C) 98.1 F (36.7 C)   GENERAL:65 y.o.male currently in no acute distress.  HEAD: Normocephalic, atraumatic.  EYES: Pupils equal, round, reactive to light. Extraocular muscles intact. No scleral icterus.  MOUTH: Moist mucosal membrane. Dentition intact. No abscess noted.  EAR, NOSE, THROAT: Clear without exudates. No external  lesions.  NECK: Supple. No thyromegaly. No nodules. No JVD.  PULMONARY: Basilar rhonchi without wheeze No use of accessory muscles, Good respiratory effort. good air entry bilaterally CHEST: Nontender to palpation.  CARDIOVASCULAR: S1 and S2. Regular rate and rhythm. No murmurs, rubs, or gallops. No edema. Pedal pulses 2+ bilaterally.  GASTROINTESTINAL: Soft, nontender, nondistended. No masses. Positive bowel sounds. No hepatosplenomegaly.  MUSCULOSKELETAL: No swelling, clubbing, or edema. Range of motion full in all extremities.  NEUROLOGIC: Cranial nerves II through XII are intact. No gross focal neurological deficits. Sensation intact. Reflexes intact.  SKIN: No ulceration, lesions, rashes, or cyanosis. Skin warm and dry. Turgor intact.  PSYCHIATRIC: Mood, affect within normal limits. The patient is awake, alert and oriented x 3. Insight, judgment intact.      LABORATORY PANEL:   CBC  Recent Labs Lab 06/01/16 0325  WBC 8.6  HGB 8.6*  HCT 26.1*  PLT 160   ------------------------------------------------------------------------------------------------------------------  Chemistries   Recent Labs Lab 06/01/16 0325  NA 137  K 4.8  CL 98*  CO2 32  GLUCOSE 203*  BUN 27*  CREATININE 1.31*  CALCIUM 8.9  AST 18  ALT 11*  ALKPHOS 73  BILITOT 0.5   ------------------------------------------------------------------------------------------------------------------  Cardiac Enzymes  Recent Labs Lab 05/31/16 1358  TROPONINI <0.03   ------------------------------------------------------------------------------------------------------------------  RADIOLOGY:  Dg Chest 1 View  Result Date: 05/31/2016 CLINICAL DATA:  Shortness of breath, asthma, COPD, diabetes mellitus, stroke, cancer patient (lung cancer, renal cancer, lymphoma, colon cancer) EXAM: CHEST 1 VIEW COMPARISON:  Portable exam 1336 hours compared to 05/09/2016 FINDINGS: LEFT subclavian Port-A-Cath with tip  projecting  over SVC. Normal heart size, mediastinal contours, and pulmonary vascularity. Emphysematous and mild bronchitic changes compatible with COPD. Linear scarring RIGHT upper lobe. Slight chronic accentuation of LEFT basilar markings unchanged. Remaining lungs clear. No pleural effusion or pneumothorax. Bones demineralized with old BILATERAL rib fractures. IMPRESSION: COPD changes with RIGHT upper lobe scarring. No acute infiltrate. Electronically Signed   By: Lavonia Dana M.D.   On: 05/31/2016 14:01   Nm Pet Image Restag (ps) Skull Base To Thigh  Result Date: 05/30/2016 CLINICAL DATA:  Subsequent treatment strategy for lung cancer. EXAM: NUCLEAR MEDICINE PET SKULL BASE TO THIGH TECHNIQUE: 12.7 mCi F-18 FDG was injected intravenously. Full-ring PET imaging was performed from the skull base to thigh after the radiotracer. CT data was obtained and used for attenuation correction and anatomic localization. FASTING BLOOD GLUCOSE:  Value: 144 mg/dl COMPARISON:  CT abdomen pelvis 05/16/2016, MR abdomen 01/17/2016 at 15:17. FINDINGS: NECK No hypermetabolic lymph nodes in the neck. CT images show no acute findings. CHEST No hypermetabolic mediastinal, hilar or axillary lymph nodes. No hypermetabolic pulmonary nodules. CT images show a diverticulum adjacent to the right aspect of the upper esophagus, with an air debris level, as on 11/30/2015. Left IJ Port-A-Cath terminates in the SVC. Coronary artery calcification. No pericardial or pleural effusion. Post radiation changes in the posteromedial right hemi thorax. ABDOMEN/PELVIS No abnormal hypermetabolism in the liver, adrenal glands, spleen or pancreas. Porta hepatis, abdominal peritoneal ligament and retroperitoneal adenopathy does not show abnormal hypermetabolism. Liver is unremarkable. Small stones are seen in the gallbladder. Adrenal glands are unremarkable. Low-attenuation lesions in the kidneys measure up to 4.0 cm on the right. The largest lesion in the  right kidney has been shown to represent renal cell carcinoma, as on prior imaging and biopsy. Spleen, pancreas, stomach and bowel are grossly unremarkable. Atherosclerotic calcification of the arterial vasculature. SKELETON No abnormal osseous hypermetabolism.  Old right rib fractures. IMPRESSION: 1. No evidence of recurrent or metastatic lung cancer. 2. Porta hepatis, abdominal peritoneal ligament and retroperitoneal adenopathy is not hypermetabolic. Adenopathy is better measured on diagnostic examination performed 05/16/2016. 3. Right renal cell carcinoma and prior ablation, better evaluated on prior imaging. 4. Upper esophageal diverticulum. 5. Aortic atherosclerosis and coronary artery calcification. 6. Cholelithiasis. Electronically Signed   By: Lorin Picket M.D.   On: 05/30/2016 13:34    EKG:   Orders placed or performed during the hospital encounter of 05/31/16  . ED EKG  . ED EKG    ASSESSMENT AND PLAN:   Craig [name] is a 65 y.o. male presenting with Shortness of Breath . Admitted 05/31/2016 : Day #: 1 day 1.Chronic obstructive pulmonary disease exacerbation: Provide DuoNeb treatments q. 4 hours, decrease steroids Solu-Medrol 60 mg IV q. daily, continue azithromycin. Continue with home medications.   2. Type 2 diabetes insulin requiring continuous sliding scale coverage, decrease steroids as above 3. Essential hypertension: Continue Hyzaar, 4. Venous thrombi embolism prophylactic: Lovenox    All the records are reviewed and case discussed with Care Management/Social Workerr. Management plans discussed with the patient, family and they are in agreement.  CODE STATUS: full TOTAL TIME TAKING CARE OF THIS PATIENT: 28 minutes.   POSSIBLE D/C IN 1-2DAYS, DEPENDING ON CLINICAL CONDITION.   Hower,  Karenann Cai.D on 06/01/2016 at 8:22 AM  Between 7am to 6pm - Pager - [phone]  After 6pm: House Pager: - [place] [facility]  Office   [phone]  CC: Primary care physician; [name], MD

## 2016-06-01 NOTE — Consult Note (Signed)
   [facility] CM Inpatient Consult   06/01/2016  Daran Favaro. 1951/02/01 [id number]  Patient screened for potential [place] Management services. Patient is eligible for [place]. Stopped by patient's room to speak with patient about services and patient was sleeping and did not rouse after knocking. Spoke with inpatient case manager to let her know patient is eligible for services. If patient is appropriate for services please place a [place] [facility] referral so we can engage.  For questions please contact:   Roslynn Holte RN, [facility] Liaison  [phone]) Business Mobile [phone]) Toll free office

## 2016-06-02 DIAGNOSIS — C8303 Small cell B-cell lymphoma, intra-abdominal lymph nodes: Secondary | ICD-10-CM

## 2016-06-02 DIAGNOSIS — C641 Malignant neoplasm of right kidney, except renal pelvis: Secondary | ICD-10-CM

## 2016-06-02 DIAGNOSIS — I10 Essential (primary) hypertension: Secondary | ICD-10-CM

## 2016-06-02 DIAGNOSIS — J42 Unspecified chronic bronchitis: Secondary | ICD-10-CM

## 2016-06-02 DIAGNOSIS — N189 Chronic kidney disease, unspecified: Secondary | ICD-10-CM

## 2016-06-02 DIAGNOSIS — N289 Disorder of kidney and ureter, unspecified: Secondary | ICD-10-CM

## 2016-06-02 DIAGNOSIS — Z9221 Personal history of antineoplastic chemotherapy: Secondary | ICD-10-CM

## 2016-06-02 DIAGNOSIS — J9600 Acute respiratory failure, unspecified whether with hypoxia or hypercapnia: Secondary | ICD-10-CM

## 2016-06-02 DIAGNOSIS — Z923 Personal history of irradiation: Secondary | ICD-10-CM

## 2016-06-02 DIAGNOSIS — Z85118 Personal history of other malignant neoplasm of bronchus and lung: Secondary | ICD-10-CM

## 2016-06-02 DIAGNOSIS — E119 Type 2 diabetes mellitus without complications: Secondary | ICD-10-CM

## 2016-06-02 DIAGNOSIS — J441 Chronic obstructive pulmonary disease with (acute) exacerbation: Secondary | ICD-10-CM

## 2016-06-02 LAB — GLUCOSE, CAPILLARY
GLUCOSE-CAPILLARY: 164 mg/dL — AB (ref 65–99)
Glucose-Capillary: 116 mg/dL — ABNORMAL HIGH (ref 65–99)

## 2016-06-02 MED ORDER — DEXTROMETHORPHAN POLISTIREX ER 30 MG/5ML PO SUER: 30.0000 mg | Freq: Two times a day (BID) | ORAL | 0 refills | Status: DC

## 2016-06-02 MED ORDER — PREDNISONE 10 MG (21) PO TBPK: 10.0000 mg | ORAL_TABLET | Freq: Every day | ORAL | 0 refills | Status: DC

## 2016-06-02 MED ORDER — AZITHROMYCIN 250 MG PO TABS: ORAL_TABLET | ORAL | 0 refills | Status: DC

## 2016-06-02 MED ORDER — GUAIFENESIN ER 600 MG PO TB12: 600.0000 mg | ORAL_TABLET | Freq: Two times a day (BID) | ORAL | 0 refills | Status: DC

## 2016-06-02 NOTE — Consult Note (Signed)
[facility] CONSULT NOTE  Patient Care Team: [name], MD as PCP - General (Family Medicine)  CHIEF COMPLAINTS/PURPOSE OF CONSULTATION: Lymphoma  HISTORY OF PRESENTING ILLNESS:  Craig [name]. 65 y.o.  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  Patient feels his breathing is improving. He feels is back to baseline. Denies any nausea vomiting. Denies any night sweats. He has any fevers. Denies any significant weight loss. Appetite is good. Denies abdominal pain. Complains of cough. No hemoptysis. Again improving.   ROS: A complete 10 point review of system is done which is negative except mentioned above in history of present illness  MEDICAL HISTORY:  Past Medical History:  Diagnosis Date  . Asthma   . Cancer ([place])   . Colon cancer ([place])   . COPD (chronic obstructive pulmonary disease) ([place])   . Diabetes mellitus without complication ([place])   . Difficulty sleeping   . Dysrhythmia   . GERD (gastroesophageal reflux disease)   . Glaucoma   . History of bronchitis   . Hypertension   . Lung cancer ([place])    right upper lobe mass positive for adenocarcinoma  . Lymphoma ([facility])    low grade  . Renal cancer ([place])   . Respiratory failure ([place]) 07/12/2010   acute  . Right renal mass   . Shortness of breath dyspnea   . Stroke ([place]) 1990'S   NO RESIDUAL PROBLEMS  . Supplemental oxygen dependent    USES 3 LITERS CONTINUOUSLY    SURGICAL HISTORY: History reviewed. No pertinent surgical history.  SOCIAL HISTORY: Social History   Social History  . Marital status: Single    Spouse name: N/A  . Number of children: N/A  . Years of education: N/A   Occupational History  . Not on  file.   Social History Main Topics  . Smoking status: Former Smoker    Packs/day: 1.00    Years: 30.00    Types: Cigarettes    Start date: 12/28/1968    Quit date: 01/13/2010  . Smokeless tobacco: Never Used  . Alcohol use No     Comment: Stopped 2001  . Drug use: No  . Sexual activity: Not on file   Other Topics Concern  . Not on file   Social History Narrative  . No narrative on file    FAMILY HISTORY: Family History  Problem Relation Age of Onset  . Diabetes Mellitus II Mother   . Heart disease Father   . Hypertension Father   . Diabetes Mellitus II Sister   . Prostate cancer Brother     ALLERGIES:  is allergic to iodinated diagnostic agents and nexium [esomeprazole magnesium].  MEDICATIONS:  Current Facility-Administered Medications  Medication Dose Route Frequency Provider Last Rate Last Dose  . acetaminophen (TYLENOL) tablet 650 mg  650 mg Oral Q6H PRN Nicholes Mango, MD       Or  . acetaminophen (TYLENOL) suppository 650 mg  650 mg Rectal Q6H PRN Aruna Gouru, MD      . albuterol (PROVENTIL) (2.5 MG/3ML) 0.083% nebulizer solution 2.5 mg  2.5 mg Nebulization Q4H PRN Aruna Gouru, MD      . albuterol (PROVENTIL) (2.5 MG/3ML) 0.083% nebulizer solution 2.5 mg  2.5 mg Nebulization Q6H Lytle Butte,  MD   2.5 mg at 06/02/16 0736  . azithromycin (ZITHROMAX) tablet 250 mg  250 mg Oral Daily Nicholes Mango, MD   250 mg at 06/02/16 0945  . guaiFENesin (MUCINEX) 12 hr tablet 600 mg  600 mg Oral BID Lytle Butte, MD   600 mg at 06/02/16 0945   And  . dextromethorphan (DELSYM) 30 MG/5ML liquid 30 mg  30 mg Oral BID Lytle Butte, MD   30 mg at 06/02/16 0945  . diphenhydrAMINE (BENADRYL) 12.5 MG/5ML elixir 25 mg  25 mg Oral Daily PRN Nicholes Mango, MD   25 mg at 06/01/16 1336  . docusate sodium (COLACE) capsule 100 mg  100 mg Oral BID Nicholes Mango, MD      . enoxaparin (LOVENOX) injection 40 mg  40 mg Subcutaneous Q24H Nicholes Mango, MD   40 mg at 06/01/16 1837  . losartan (COZAAR) tablet  50 mg  50 mg Oral Daily Nicholes Mango, MD   50 mg at 06/02/16 0945   And  . hydrochlorothiazide (MICROZIDE) capsule 12.5 mg  12.5 mg Oral Daily Nicholes Mango, MD   12.5 mg at 06/02/16 0945  . HYDROcodone-acetaminophen (NORCO/VICODIN) 5-325 MG per tablet 1 tablet  1 tablet Oral Q6H PRN Nicholes Mango, MD   1 tablet at 06/02/16 0741  . insulin aspart (novoLOG) injection 0-15 Units  0-15 Units Subcutaneous TID WC Nicholes Mango, MD   3 Units at 06/01/16 0815  . insulin aspart (novoLOG) injection 0-5 Units  0-5 Units Subcutaneous QHS Nicholes Mango, MD   3 Units at 05/31/16 2125  . insulin aspart (novoLOG) injection 3 Units  3 Units Subcutaneous TID WC Lytle Butte, MD      . loperamide (IMODIUM) capsule 2 mg  2 mg Oral QID PRN Nicholes Mango, MD      . methylPREDNISolone sodium succinate (SOLU-MEDROL) 125 mg/2 mL injection 60 mg  60 mg Intravenous Daily Lytle Butte, MD   60 mg at 06/02/16 0944  . ondansetron (ZOFRAN) tablet 4 mg  4 mg Oral Q6H PRN Nicholes Mango, MD       Or  . ondansetron (ZOFRAN) injection 4 mg  4 mg Intravenous Q6H PRN Aruna Gouru, MD      . sodium chloride flush (NS) 0.9 % injection 3 mL  3 mL Intravenous Q12H Nicholes Mango, MD   3 mL at 06/02/16 0945  . temazepam (RESTORIL) capsule 7.5-15 mg  7.5-15 mg Oral QHS PRN Nicholes Mango, MD   15 mg at 06/02/16 0012  . theophylline (UNIPHYL) 400 MG 24 hr tablet 400 mg  400 mg Oral Q2000 Nicholes Mango, MD   400 mg at 06/01/16 2212  . tiotropium (SPIRIVA) inhalation capsule 18 mcg  18 mcg Inhalation Daily Nicholes Mango, MD   18 mcg at 06/02/16 0946   Facility-Administered Medications Ordered in Other Encounters  Medication Dose Route Frequency Provider Last Rate Last Dose  . sodium chloride 0.9 % injection 10 mL  10 mL Intracatheter PRN Forest Gleason, MD   10 mL at 04/28/15 1410  . sodium chloride 0.9 % injection 10 mL  10 mL Intravenous PRN Forest Gleason, MD   10 mL at 05/12/15 1350      .  PHYSICAL EXAMINATION:  Vitals:   06/02/16 0505 06/02/16 0506   BP: (!) 118/54   Pulse: 92 94  Resp: 16   Temp: 98.7 F (37.1 C)    Filed Weights   05/31/16 1411 05/31/16 1835  Weight: 154 lb (  69.9 kg) 152 lb 14.4 oz (69.4 kg)    GENERAL: Well-nourished well-developed; Alert, no distress and comfortable. Wearing oxygen O2.  EYES: no pallor or icterus OROPHARYNX: no thrush or ulceration. NECK: supple, no masses felt LYMPH:  no palpable lymphadenopathy in the cervical, axillary or inguinal regions LUNGS: decreased breath sounds to auscultation at bases and  No wheeze or crackles HEART/CVS: regular rate & rhythm and no murmurs; No lower extremity edema ABDOMEN: abdomen soft, non-tender and normal bowel sounds Musculoskeletal:no cyanosis of digits and no clubbing  PSYCH: alert & oriented x 3 with fluent speech NEURO: no focal motor/sensory deficits SKIN:  no rashes or significant lesions  LABORATORY DATA:  I have reviewed the data as listed Lab Results  Component Value Date   WBC 8.6 06/01/2016   HGB 8.6 (L) 06/01/2016   HCT 26.1 (L) 06/01/2016   MCV 83.2 06/01/2016   PLT 160 06/01/2016    Recent Labs  12/07/15 1112  03/07/16 1433 04/04/16 1047 05/16/16 1140 05/31/16 1358 06/01/16 0325  NA 137  < > 141 141  --  142 137  K 4.5  < > 3.5 3.5  --  3.7 4.8  CL 95*  < > 100* 96*  --  101 98*  CO2 37*  < > 34* 36*  --  33* 32  GLUCOSE 189*  < > 197* 151*  --  108* 203*  BUN 22*  < > 25* 20  --  25* 27*  CREATININE 1.17  < > 1.31* 1.30* 1.30* 1.36* 1.31*  CALCIUM 8.9  < > 9.0 9.3  --  9.2 8.9  GFRNONAA >60  < > 56* 56*  --  53* 56*  GFRAA >60  < > >60 >60  --  >60 >60  PROT 6.7  --  6.9  --   --   --  6.5  ALBUMIN 3.7  --  3.6  --   --   --  3.8  AST 13*  --  15  --   --   --  18  ALT 15*  --  13*  --   --   --  11*  ALKPHOS 58  --  62  --   --   --  73  BILITOT 0.4  --  0.2*  --   --   --  0.5  < > = values in this interval not displayed.  RADIOGRAPHIC STUDIES: I have personally reviewed the radiological images as listed and  agreed with the findings in the report. Dg Chest 1 View  Result Date: 05/31/2016 CLINICAL DATA:  Shortness of breath, asthma, COPD, diabetes mellitus, stroke, cancer patient (lung cancer, renal cancer, lymphoma, colon cancer) EXAM: CHEST 1 VIEW COMPARISON:  Portable exam 1336 hours compared to 05/09/2016 FINDINGS: LEFT subclavian Port-A-Cath with tip projecting over SVC. Normal heart size, mediastinal contours, and pulmonary vascularity. Emphysematous and mild bronchitic changes compatible with COPD. Linear scarring RIGHT upper lobe. Slight chronic accentuation of LEFT basilar markings unchanged. Remaining lungs clear. No pleural effusion or pneumothorax. Bones demineralized with old BILATERAL rib fractures. IMPRESSION: COPD changes with RIGHT upper lobe scarring. No acute infiltrate. Electronically Signed   By: Lavonia Dana M.D.   On: 05/31/2016 14:01   Dg Chest 2 View  Result Date: 05/09/2016 CLINICAL DATA:  Shortness of breath. EXAM: CHEST  2 VIEW COMPARISON:  02/10/2016 . FINDINGS: PowerPort catheter stable position. Mediastinum hilar structures are normal. Bilateral pleural-parenchymal thickening consistent with scarring. Severe is bullous  COPD. No pleural effusion or pneumothorax. Old right rib fractures . Stable synostosis left anterior ribs. Stable lower thoracic vertebral body compression fracture. IMPRESSION: 1.  Power port catheter noted in good anatomic position. 2.  Severe bullous COPD with pleural parenchymal scarring . Electronically Signed   By: Marcello Moores  Register   On: 05/09/2016 10:37  Nm Pet Image Restag (ps) Skull Base To Thigh  Result Date: 05/30/2016 CLINICAL DATA:  Subsequent treatment strategy for lung cancer. EXAM: NUCLEAR MEDICINE PET SKULL BASE TO THIGH TECHNIQUE: 12.7 mCi F-18 FDG was injected intravenously. Full-ring PET imaging was performed from the skull base to thigh after the radiotracer. CT data was obtained and used for attenuation correction and anatomic localization.  FASTING BLOOD GLUCOSE:  Value: 144 mg/dl COMPARISON:  CT abdomen pelvis 05/16/2016, MR abdomen 01/17/2016 at 15:17. FINDINGS: NECK No hypermetabolic lymph nodes in the neck. CT images show no acute findings. CHEST No hypermetabolic mediastinal, hilar or axillary lymph nodes. No hypermetabolic pulmonary nodules. CT images show a diverticulum adjacent to the right aspect of the upper esophagus, with an air debris level, as on 11/30/2015. Left IJ Port-A-Cath terminates in the SVC. Coronary artery calcification. No pericardial or pleural effusion. Post radiation changes in the posteromedial right hemi thorax. ABDOMEN/PELVIS No abnormal hypermetabolism in the liver, adrenal glands, spleen or pancreas. Porta hepatis, abdominal peritoneal ligament and retroperitoneal adenopathy does not show abnormal hypermetabolism. Liver is unremarkable. Small stones are seen in the gallbladder. Adrenal glands are unremarkable. Low-attenuation lesions in the kidneys measure up to 4.0 cm on the right. The largest lesion in the right kidney has been shown to represent renal cell carcinoma, as on prior imaging and biopsy. Spleen, pancreas, stomach and bowel are grossly unremarkable. Atherosclerotic calcification of the arterial vasculature. SKELETON No abnormal osseous hypermetabolism.  Old right rib fractures. IMPRESSION: 1. No evidence of recurrent or metastatic lung cancer. 2. Porta hepatis, abdominal peritoneal ligament and retroperitoneal adenopathy is not hypermetabolic. Adenopathy is better measured on diagnostic examination performed 05/16/2016. 3. Right renal cell carcinoma and prior ablation, better evaluated on prior imaging. 4. Upper esophageal diverticulum. 5. Aortic atherosclerosis and coronary artery calcification. 6. Cholelithiasis. Electronically Signed   By: Lorin Picket M.D.   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 Additional history of lymphoma. Followup study. EXAM: CT ABDOMEN WITHOUT AND WITH CONTRAST TECHNIQUE: Multidetector CT imaging of the abdomen was performed following the standard protocol before and following the bolus administration of intravenous contrast. CONTRAST:  100 mL of Isovue 370. COMPARISON:  MRI of the abdomen 01/17/2016.  PET-CT 11/30/2015. FINDINGS: Lower chest:  Unremarkable. Hepatobiliary: Sub cm low-attenuation lesions in segment 4A and segment 5 of the liver are both too small to definitively characterize, but are favored to represent tiny cysts. No larger more suspicious appearing hepatic lesions are noted. No intra or extrahepatic biliary ductal dilatation. Gallbladder is nearly completely decompressed, but otherwise unremarkable in appearance. Pancreas: No pancreatic mass. No pancreatic ductal dilatation. No pancreatic or peripancreatic fluid or inflammatory changes. Spleen: Unremarkable. Adrenals/Urinary Tract: The ablation site in the anterior aspect of the interpolar region of the right kidney is a well-defined area of intermediate attenuation, which does not enhance, measuring approximately 4.9 x 5.5 cm (image 33 of series 5). There is slightly higher attenuation in the periphery of the ablation site, with some minimal progressive enhancement, presumably  related to developing scar tissue and granulation tissue. No discrete hypervascular mural nodule or internal enhancement is identified to suggest local recurrence of disease. There is some mild adjacent thickening of the lateral conal fascia which demonstrates some progressive enhancement, also likely related to developing scar tissue. 1.5 cm simple cyst in the lower pole of the left kidney. Other sub cm low-attenuation lesions in the kidneys bilaterally are too small to definitively characterize, but are favored to represent cysts. Mild bilateral perinephric stranding. No  hydroureteronephrosis in the visualized abdomen. Bilateral adrenal glands are normal in appearance. Stomach/Bowel: The appearance of the stomach is normal. No pathologic dilatation of small bowel or colon. Vascular/Lymphatic: Aortic atherosclerosis, without evidence of aneurysm or dissection in the visualized abdominal vasculature. Extensive bulky lymphadenopathy is noted throughout the upper abdomen and retroperitoneum. This is most prevalent superior to the pancreas and in the splenic hilum where there is extensive matted lymphadenopathy along the course of the splenic artery and vein. The largest individual measurable lymph nodes include a 2 cm short axis left para-aortic retroperitoneal lymph node (image 47 of series 5), 1.6 cm short axis left para-aortic lymph node adjacent to the left renal hilum (image 25 of series 5), and a 2.8 cm hepatoduodenal ligament lymph node (image 33 of series 5), all of which appear increased compared to prior MRI 01/17/2016. Other: No significant volume of ascites or pneumoperitoneum in the visualized peritoneal cavity. Musculoskeletal: There are no aggressive appearing lytic or blastic lesions noted in the visualized portions of the skeleton. IMPRESSION: 1. Expected post procedural changes of cryoablation in the interpolar region of the right kidney, without definitive evidence of residual disease. Today's study will serve as a baseline for future followup examinations. 2. Marked interval increase in upper abdominal and retroperitoneal lymphadenopathy, as discussed above, presumably related to recurrence of the patient's known lymphoma. 3. Aortic atherosclerosis. 4. Additional incidental findings, as above. Electronically Signed   By: Vinnie Langton M.D.   On: 05/16/2016 14:23    ASSESSMENT & PLAN:   # 65 year old patient with multiple malignancies-currently admitted to the hospital for some shortness of breath/COPD exacerbation  # Acute on chronic respiratory failure-  seems to be improving/close to baseline. Patient continues to be on oxygen baseline.  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 I had a long discussion the patient and family that in general we do not treat low-grade lymphomas unless Clinically symptomatic. Patient is currently asymptomatic- however I think he would need treatment fairly soon given the conglomerate adenopathy in his abdomen. He and his family understood.  # Kidney cancer- right kidney status post ablation [may 2017]. Recent CT scan shows stable disease/no concerns for progression.  # History of lung cancer status post chemoradiation June 2013- currently no evidence of recurrence.  The above plan of care was discussed with the patient's family in detail. All questions were answered.  Also discussed with Dr.Vaickute; patient to be discharged from oncology standpoint.  Thank you Dr. Ether Griffins for allowing me to participate in the care of your pleasant patient.    I reviewed the images myself and with the patient and family in detail.   All questions were answered. The patient knows to call the clinic with any problems, questions or concerns.     [name], MD 06/02/2016 10:36 AM

## 2016-06-02 NOTE — Care Management Note (Signed)
Case Management Note  Patient Details  Name: Craig [name]. MRN: [id number] Date of Birth: 1951-03-27  Subjective/Objective:      Discussed discharge planning with [name]. [employer] accepts his insurance and a referral was made to [facility] for home health PT. [name] was undated that he will receive a call at home to schedule a home health PT visit.               Action/Plan:   Expected Discharge Date:                  Expected Discharge Plan:     In-House Referral:     Discharge planning Services     Post Acute Care Choice:    Choice offered to:     DME Arranged:    DME Agency:     HH Arranged:    HH Agency:     Status of Service:     If discussed at [employer] of Stay Meetings, dates discussed:    Additional Comments:  Bertrand Vowels A, RN 06/02/2016, 9:07 AM

## 2016-06-02 NOTE — Discharge Summary (Signed)
[facility] NAME: Craig [name]    MR#:  [id number]  DATE OF BIRTH:  1950-12-06  DATE OF ADMISSION:  05/31/2016 ADMITTING PHYSICIAN: Nicholes Mango, MD  DATE OF DISCHARGE: 06/02/2016 11:50 AM  PRIMARY CARE PHYSICIAN: [name], MD     ADMISSION DIAGNOSIS:  COPD exacerbation ([place]) [J44.1]  DISCHARGE DIAGNOSIS:  Principal Problem:   COPD exacerbation ([place]) Active Problems:   Chronic bronchitis ([place])   Chronic renal insufficiency   Benign essential HTN   Diabetes ([place])   SECONDARY DIAGNOSIS:   Past Medical History:  Diagnosis Date  . Asthma   . Cancer ([place])   . Colon cancer ([place])   . COPD (chronic obstructive pulmonary disease) ([place])   . Diabetes mellitus without complication ([place])   . Difficulty sleeping   . Dysrhythmia   . GERD (gastroesophageal reflux disease)   . Glaucoma   . History of bronchitis   . Hypertension   . 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 Lymphoma ([facility])    low grade  . Renal cancer ([place])   . Respiratory failure ([place]) 07/12/2010   acute  . Right renal mass   . Shortness of breath dyspnea   . Stroke ([place]) 1990'S   NO RESIDUAL PROBLEMS  . Supplemental oxygen dependent    USES 3 LITERS CONTINUOUSLY    .pro HOSPITAL COURSE:   The patient is a 65 year old male with past medical history significant for history of lymphoma, renal cancer, diabetes mellitus, asthma, gastroesophageal reflux disease, chronic respiratory failure, on 3 L of oxygen through nasal cannula at home, who presents to the hospital with complaints of shortness of breath. He felt that his portable oxygen tank was not functioning. He had no dizziness or loss of consciousness. Chest x-ray was unremarkable. Patient received several nebulizers in the hospital and was admitted with COPD exacerbation. He was initiated on steroids intravenously, antibiotics, scheduled inhalation therapy and  improved clinically. He was felt to be stable to be discharged home. He was seen by oncologist, who had long discussion with the patient and family, and recommended to follow-up with him as outpatient, since lymphoma may require therapy soon. In regards to kidney cancer, the oncologist felt that this patient had no progression of disease, same with the lung cancer, there was no evidence of recurrence.  Discussion by problem: #1. COPD exacerbation due to acute bronchitis, patient is to continue steroid taper, nebulizer therapy, follow up with his primary care physician as outpatient #2. Acute bronchitis, continue Zithromax, clinically improved, sputum cultures are still pending, recommend reassessment of antibiotic therapy #3. Essential hypertension, relatively well controlled #4 diabetes mellitus, patient was advised to continue usual medication therapy, no changes were made, blood glucose was ranging between 122- high 200s on the steroid therapy #5, lymphoma, patient is to follow-up with oncologist as outpatient.   DISCHARGE CONDITIONS:  Stable   CONSULTS OBTAINED:  Treatment Team:  Nicholes Mango, MD [name], MD  DRUG ALLERGIES:   Allergies  Allergen Reactions  . Iodinated Diagnostic Agents Shortness Of Breath and Nausea And Vomiting    13-hour prep  . Nexium [Esomeprazole Magnesium] Other (See Comments)    headache    DISCHARGE MEDICATIONS:   Discharge Medication List as of 06/02/2016 11:47 AM    START taking these medications   Details  azithromycin (ZITHROMAX) 250 MG tablet Please take 1 pill daily until finished, thanks, Normal  dextromethorphan (DELSYM) 30 MG/5ML liquid Take 5 mLs (30 mg total) by mouth 2 (two) times daily., Starting Sat 06/02/2016, Normal    guaiFENesin (MUCINEX) 600 MG 12 hr tablet Take 1 tablet (600 mg total) by mouth 2 (two) times daily., Starting Sat 06/02/2016, Normal    predniSONE (STERAPRED UNI-PAK 21 TAB) 10 MG (21) TBPK tablet Take 1  tablet (10 mg total) by mouth daily. Please take 6 pills once in the morning on the day one, then taper by 1 pill every 2 days until finished, then resume usual daily prednisone dosing, thank you, Starting Sat 06/02/2016, Normal      CONTINUE these medications which have NOT CHANGED   Details  albuterol (PROVENTIL HFA;VENTOLIN HFA) 108 (90 BASE) MCG/ACT inhaler Inhale 2 puffs into the lungs every 6 (six) hours as needed for wheezing or shortness of breath. , Until Discontinued, Historical Med    albuterol (PROVENTIL) (2.5 MG/3ML) 0.083% nebulizer solution USE 1 VIAL IN NEBULIZER EVERY 3-4 HOURS AS NEEDED, Normal    budesonide-formoterol (SYMBICORT) 160-4.5 MCG/ACT inhaler Inhale 2 puffs into the lungs 2 (two) times daily. , Until Discontinued, Historical Med    diphenhydrAMINE (BENADRYL) 25 MG tablet Take 2 tablets (50 mg total) by mouth as directed., Starting Thu 04/12/2016, Phone In    glipiZIDE (GLUCOTROL) 10 MG tablet TAKE 1 TABLET BY MOUTH DAILY, Normal    HYDROcodone-acetaminophen (NORCO/VICODIN) 5-325 MG tablet Take 1 tablet by mouth every 6 (six) hours as needed for moderate pain. Please Note new directions, Starting Tue 05/22/2016, Print    loperamide (IMODIUM A-D) 2 MG tablet Take 2 mg by mouth 4 (four) times daily as needed for diarrhea or loose stools. Reported on 12/21/2015, Until Discontinued, Historical Med    losartan-hydrochlorothiazide (HYZAAR) 50-12.5 MG per tablet Take 1 tablet by mouth daily., Until Discontinued, Historical Med    pioglitazone (ACTOS) 30 MG tablet Take 30 mg by mouth daily., Until Discontinued, Historical Med    predniSONE (DELTASONE) 10 MG tablet TAKE 1 TABLET (10 MG TOTAL) BY MOUTH DAILY WITH BREAKFAST., Normal    temazepam (RESTORIL) 7.5 MG capsule Take 7.5-15 mg by mouth at bedtime as needed for sleep. Reported on 12/21/2015, Starting 05/02/2015, Until Discontinued, Historical Med    theophylline (UNIPHYL) 400 MG 24 hr tablet Take 400 mg by mouth daily as  needed (For breathing.). , Starting 11/11/2015, Until Discontinued, Historical Med    tiotropium (SPIRIVA) 18 MCG inhalation capsule Place 18 mcg into inhaler and inhale daily. , Until Discontinued, Historical Med    diphenhydrAMINE (BENADRYL) 12.5 MG chewable tablet Chew 25 mg by mouth daily as needed for allergies. , Until Discontinued, Historical Med    docusate sodium (COLACE) 100 MG capsule Take 1 capsule (100 mg total) by mouth 2 (two) times daily., Starting Thu 03/01/2016, No Print         DISCHARGE INSTRUCTIONS:    The patient is to follow-up with primary care physician, oncologist as outpatient  If you experience worsening of your admission symptoms, develop shortness of breath, life threatening emergency, suicidal or homicidal thoughts you must seek medical attention immediately by calling 911 or calling your MD immediately  if symptoms less severe.  You Must read complete instructions/literature along with all the possible adverse reactions/side effects for all the Medicines you take and that have been prescribed to you. Take any new Medicines after you have completely understood and accept all the possible adverse reactions/side effects.   Please note  You were cared for by a  hospitalist during your hospital stay. If you have any questions about your discharge medications or the care you received while you were in the hospital after you are discharged, you can call the unit and asked to speak with the hospitalist on call if the hospitalist that took care of you is not available. Once you are discharged, your primary care physician will handle any further medical issues. Please note that NO REFILLS for any discharge medications will be authorized once you are discharged, as it is imperative that you return to your primary care physician (or establish a relationship with a primary care physician if you do not have one) for your aftercare needs so that they can reassess your need for  medications and monitor your lab values.    Today   CHIEF COMPLAINT:   Chief Complaint  Patient presents with  . Shortness of Breath    HISTORY OF PRESENT ILLNESS:  Craig [name]  is a 65 y.o. male with a known history of  lymphoma, renal cancer, diabetes mellitus, asthma, gastroesophageal reflux disease, chronic respiratory failure, on 3 L of oxygen through nasal cannula at home, who presents to the hospital with complaints of shortness of breath. He felt that his portable oxygen tank was not functioning. He had no dizziness or loss of consciousness. Chest x-ray was unremarkable. Patient received several nebulizers in the hospital and was admitted with COPD exacerbation. He was initiated on steroids intravenously, antibiotics, scheduled inhalation therapy and improved clinically. He was felt to be stable to be discharged home. He was seen by oncologist, who had long discussion with the patient and family, and recommended to follow-up with him as outpatient, since lymphoma may require therapy soon. In regards to kidney cancer, the oncologist felt that this patient had no progression of disease, same with the lung cancer, there was no evidence of recurrence.  Discussion by problem: #1. COPD exacerbation due to acute bronchitis, patient is to continue steroid taper, nebulizer therapy, follow up with his primary care physician as outpatient #2. Acute bronchitis, continue Zithromax, clinically improved, sputum cultures are still pending, recommend reassessment of antibiotic therapy #3. Essential hypertension, relatively well controlled #4 diabetes mellitus, patient was advised to continue usual medication therapy, no changes were made, blood glucose was ranging between 122- high 200s on the steroid therapy #5, lymphoma, patient is to follow-up with oncologist as outpatient.     VITAL SIGNS:  Blood pressure (!) 118/54, pulse 94, temperature 98.7 F (37.1 C), temperature source Oral, resp. rate  16, height '5\' 8"'$  (1.727 m), weight 69.4 kg (152 lb 14.4 oz), SpO2 99 %.  I/O:   Intake/Output Summary (Last 24 hours) at 06/02/16 1403 Last data filed at 06/02/16 0947  Gross per 24 hour  Intake              480 ml  Output             1000 ml  Net             -520 ml    PHYSICAL EXAMINATION:  GENERAL:  65 y.o.-year-old patient lying in the bed with no acute distress.  EYES: Pupils equal, round, reactive to light and accommodation. No scleral icterus. Extraocular muscles intact.  HEENT: Head atraumatic, normocephalic. Oropharynx and nasopharynx clear.  NECK:  Supple, no jugular venous distention. No thyroid enlargement, no tenderness.  LUNGS: Some diminishedbreath sounds bilaterally,  Scattered wheezing, rales,rhonchi , but no crepitations. No use of accessory muscles of respiration.  CARDIOVASCULAR: S1,  S2 normal. No murmurs, rubs, or gallops.  ABDOMEN: Soft, non-tender, non-distended. Bowel sounds present. No organomegaly or mass.  EXTREMITIES: No pedal edema, cyanosis, or clubbing.  NEUROLOGIC: Cranial nerves II through XII are intact. Muscle strength 5/5 in all extremities. Sensation intact. Gait not checked.  PSYCHIATRIC: The patient is alert and oriented x 3.  SKIN: No obvious rash, lesion, or ulcer.   DATA REVIEW:   CBC  Recent Labs Lab 06/01/16 0325  WBC 8.6  HGB 8.6*  HCT 26.1*  PLT 160    Chemistries   Recent Labs Lab 06/01/16 0325  NA 137  K 4.8  CL 98*  CO2 32  GLUCOSE 203*  BUN 27*  CREATININE 1.31*  CALCIUM 8.9  AST 18  ALT 11*  ALKPHOS 73  BILITOT 0.5    Cardiac Enzymes  Recent Labs Lab 05/31/16 1358  TROPONINI <0.03    Microbiology Results  Results for orders placed or performed during the hospital encounter of 05/31/16  Culture, sputum-assessment     Status: None (Preliminary result)   Collection Time: 06/01/16 12:19 AM  Result Value Ref Range Status   Specimen Description EXPECTORATED SPUTUM  Final   Special Requests NONE  Final    Sputum evaluation THIS SPECIMEN IS ACCEPTABLE FOR SPUTUM CULTURE  Final   Report Status PENDING  Incomplete  Culture, respiratory (NON-Expectorated)     Status: None (Preliminary result)   Collection Time: 06/01/16 12:19 AM  Result Value Ref Range Status   Specimen Description EXPECTORATED SPUTUM  Final   Special Requests NONE Reflexed from [id number]  Final   Gram Stain   Final    ABUNDANT WBC PRESENT, PREDOMINANTLY PMN RARE SQUAMOUS EPITHELIAL CELLS PRESENT FEW GRAM POSITIVE COCCI IN PAIRS IN CLUSTERS RARE GRAM NEGATIVE COCCI IN PAIRS RARE GRAM POSITIVE RODS    Culture   Final    CULTURE REINCUBATED FOR BETTER GROWTH Performed at [facility]    Report Status PENDING  Incomplete    RADIOLOGY:  No results found.  EKG:   Orders placed or performed during the hospital encounter of 05/31/16  . ED EKG  . ED EKG      Management plans discussed with the patient, family and they are in agreement.  CODE STATUS:     Code Status Orders        Start     Ordered   05/31/16 1849  Do not attempt resuscitation (DNR)  Continuous    Question Answer Comment  In the event of cardiac or respiratory ARREST Do not call a "code blue"   In the event of cardiac or respiratory ARREST Do not perform Intubation, CPR, defibrillation or ACLS   In the event of cardiac or respiratory ARREST Use medication by any route, position, wound care, and other measures to relive pain and suffering. May use oxygen, suction and manual treatment of airway obstruction as needed for comfort.   Comments RN may pronounce      05/31/16 1848    Code Status History    Date Active Date Inactive Code Status Order ID Comments User Context   11/19/2015  7:53 PM 11/20/2015  2:12 PM Full Code [id number]  Idelle Crouch, MD Inpatient    Advance Directive Documentation   Flowsheet Row Most Recent Value  Type of Advance Directive  Living will  Pre-existing out of facility DNR order (yellow form or pink MOST form)   No data  "MOST" Form in Place?  No data  TOTAL TIME TAKING CARE OF THIS PATIENT: 40 minutes.    [name] M.D on 06/02/2016 at 2:03 PM  Between 7am to 6pm - Pager - [phone]  After 6pm go to [email or web address] - password [email or web address]  Office  [phone]  CC: Primary care physician; [name], MD

## 2016-06-02 NOTE — Progress Notes (Signed)
Discharge instructions given and went over with patient at bedside. All questions answered. Patient discharged home via wheelchair by nursing staff. Madlyn Frankel, RN

## 2016-06-03 LAB — CULTURE, RESPIRATORY: CULTURE: NORMAL

## 2016-06-03 LAB — CULTURE, RESPIRATORY W GRAM STAIN

## 2016-06-04 DIAGNOSIS — E1122 Type 2 diabetes mellitus with diabetic chronic kidney disease: Secondary | ICD-10-CM | POA: Diagnosis not present

## 2016-06-04 DIAGNOSIS — Z7984 Long term (current) use of oral hypoglycemic drugs: Secondary | ICD-10-CM | POA: Diagnosis not present

## 2016-06-04 DIAGNOSIS — I129 Hypertensive chronic kidney disease with stage 1 through stage 4 chronic kidney disease, or unspecified chronic kidney disease: Secondary | ICD-10-CM | POA: Diagnosis not present

## 2016-06-04 DIAGNOSIS — J9611 Chronic respiratory failure with hypoxia: Secondary | ICD-10-CM | POA: Diagnosis not present

## 2016-06-04 DIAGNOSIS — Z85118 Personal history of other malignant neoplasm of bronchus and lung: Secondary | ICD-10-CM | POA: Diagnosis not present

## 2016-06-04 DIAGNOSIS — H409 Unspecified glaucoma: Secondary | ICD-10-CM | POA: Diagnosis not present

## 2016-06-04 DIAGNOSIS — K219 Gastro-esophageal reflux disease without esophagitis: Secondary | ICD-10-CM | POA: Diagnosis not present

## 2016-06-04 DIAGNOSIS — N189 Chronic kidney disease, unspecified: Secondary | ICD-10-CM | POA: Diagnosis not present

## 2016-06-04 DIAGNOSIS — J441 Chronic obstructive pulmonary disease with (acute) exacerbation: Secondary | ICD-10-CM | POA: Diagnosis not present

## 2016-06-06 DIAGNOSIS — H409 Unspecified glaucoma: Secondary | ICD-10-CM | POA: Diagnosis not present

## 2016-06-06 DIAGNOSIS — Z7984 Long term (current) use of oral hypoglycemic drugs: Secondary | ICD-10-CM | POA: Diagnosis not present

## 2016-06-06 DIAGNOSIS — Z85118 Personal history of other malignant neoplasm of bronchus and lung: Secondary | ICD-10-CM | POA: Diagnosis not present

## 2016-06-06 DIAGNOSIS — K219 Gastro-esophageal reflux disease without esophagitis: Secondary | ICD-10-CM | POA: Diagnosis not present

## 2016-06-06 DIAGNOSIS — J441 Chronic obstructive pulmonary disease with (acute) exacerbation: Secondary | ICD-10-CM | POA: Diagnosis not present

## 2016-06-06 DIAGNOSIS — N189 Chronic kidney disease, unspecified: Secondary | ICD-10-CM | POA: Diagnosis not present

## 2016-06-06 DIAGNOSIS — E1122 Type 2 diabetes mellitus with diabetic chronic kidney disease: Secondary | ICD-10-CM | POA: Diagnosis not present

## 2016-06-06 DIAGNOSIS — I129 Hypertensive chronic kidney disease with stage 1 through stage 4 chronic kidney disease, or unspecified chronic kidney disease: Secondary | ICD-10-CM | POA: Diagnosis not present

## 2016-06-06 DIAGNOSIS — J9611 Chronic respiratory failure with hypoxia: Secondary | ICD-10-CM | POA: Diagnosis not present

## 2016-06-11 LAB — EXPECTORATED SPUTUM ASSESSMENT W REFEX TO RESP CULTURE

## 2016-06-12 DIAGNOSIS — H409 Unspecified glaucoma: Secondary | ICD-10-CM | POA: Diagnosis not present

## 2016-06-12 DIAGNOSIS — N189 Chronic kidney disease, unspecified: Secondary | ICD-10-CM | POA: Diagnosis not present

## 2016-06-12 DIAGNOSIS — J9611 Chronic respiratory failure with hypoxia: Secondary | ICD-10-CM | POA: Diagnosis not present

## 2016-06-12 DIAGNOSIS — K219 Gastro-esophageal reflux disease without esophagitis: Secondary | ICD-10-CM | POA: Diagnosis not present

## 2016-06-12 DIAGNOSIS — E1122 Type 2 diabetes mellitus with diabetic chronic kidney disease: Secondary | ICD-10-CM | POA: Diagnosis not present

## 2016-06-12 DIAGNOSIS — Z7984 Long term (current) use of oral hypoglycemic drugs: Secondary | ICD-10-CM | POA: Diagnosis not present

## 2016-06-12 DIAGNOSIS — Z85118 Personal history of other malignant neoplasm of bronchus and lung: Secondary | ICD-10-CM | POA: Diagnosis not present

## 2016-06-12 DIAGNOSIS — I129 Hypertensive chronic kidney disease with stage 1 through stage 4 chronic kidney disease, or unspecified chronic kidney disease: Secondary | ICD-10-CM | POA: Diagnosis not present

## 2016-06-12 DIAGNOSIS — J441 Chronic obstructive pulmonary disease with (acute) exacerbation: Secondary | ICD-10-CM | POA: Diagnosis not present

## 2016-06-13 ENCOUNTER — Other Ambulatory Visit: Payer: Self-pay | Admitting: *Deleted

## 2016-06-13 DIAGNOSIS — C3492 Malignant neoplasm of unspecified part of left bronchus or lung: Secondary | ICD-10-CM

## 2016-06-13 DIAGNOSIS — C641 Malignant neoplasm of right kidney, except renal pelvis: Secondary | ICD-10-CM

## 2016-06-13 MED ORDER — HYDROCODONE-ACETAMINOPHEN 5-325 MG PO TABS: 1.0000 | ORAL_TABLET | Freq: Four times a day (QID) | ORAL | 0 refills | Status: DC | PRN

## 2016-06-15 DIAGNOSIS — J441 Chronic obstructive pulmonary disease with (acute) exacerbation: Secondary | ICD-10-CM | POA: Diagnosis not present

## 2016-06-15 DIAGNOSIS — Z85118 Personal history of other malignant neoplasm of bronchus and lung: Secondary | ICD-10-CM | POA: Diagnosis not present

## 2016-06-15 DIAGNOSIS — K219 Gastro-esophageal reflux disease without esophagitis: Secondary | ICD-10-CM | POA: Diagnosis not present

## 2016-06-15 DIAGNOSIS — J9611 Chronic respiratory failure with hypoxia: Secondary | ICD-10-CM | POA: Diagnosis not present

## 2016-06-15 DIAGNOSIS — I129 Hypertensive chronic kidney disease with stage 1 through stage 4 chronic kidney disease, or unspecified chronic kidney disease: Secondary | ICD-10-CM | POA: Diagnosis not present

## 2016-06-15 DIAGNOSIS — H409 Unspecified glaucoma: Secondary | ICD-10-CM | POA: Diagnosis not present

## 2016-06-15 DIAGNOSIS — Z7984 Long term (current) use of oral hypoglycemic drugs: Secondary | ICD-10-CM | POA: Diagnosis not present

## 2016-06-15 DIAGNOSIS — E1122 Type 2 diabetes mellitus with diabetic chronic kidney disease: Secondary | ICD-10-CM | POA: Diagnosis not present

## 2016-06-15 DIAGNOSIS — N189 Chronic kidney disease, unspecified: Secondary | ICD-10-CM | POA: Diagnosis not present

## 2016-06-18 DIAGNOSIS — J9611 Chronic respiratory failure with hypoxia: Secondary | ICD-10-CM | POA: Diagnosis not present

## 2016-06-18 DIAGNOSIS — Z85118 Personal history of other malignant neoplasm of bronchus and lung: Secondary | ICD-10-CM | POA: Diagnosis not present

## 2016-06-18 DIAGNOSIS — I129 Hypertensive chronic kidney disease with stage 1 through stage 4 chronic kidney disease, or unspecified chronic kidney disease: Secondary | ICD-10-CM | POA: Diagnosis not present

## 2016-06-18 DIAGNOSIS — E1122 Type 2 diabetes mellitus with diabetic chronic kidney disease: Secondary | ICD-10-CM | POA: Diagnosis not present

## 2016-06-18 DIAGNOSIS — Z7984 Long term (current) use of oral hypoglycemic drugs: Secondary | ICD-10-CM | POA: Diagnosis not present

## 2016-06-18 DIAGNOSIS — J441 Chronic obstructive pulmonary disease with (acute) exacerbation: Secondary | ICD-10-CM | POA: Diagnosis not present

## 2016-06-18 DIAGNOSIS — K219 Gastro-esophageal reflux disease without esophagitis: Secondary | ICD-10-CM | POA: Diagnosis not present

## 2016-06-18 DIAGNOSIS — H409 Unspecified glaucoma: Secondary | ICD-10-CM | POA: Diagnosis not present

## 2016-06-18 DIAGNOSIS — N189 Chronic kidney disease, unspecified: Secondary | ICD-10-CM | POA: Diagnosis not present

## 2016-06-21 DIAGNOSIS — J9611 Chronic respiratory failure with hypoxia: Secondary | ICD-10-CM | POA: Diagnosis not present

## 2016-06-21 DIAGNOSIS — I129 Hypertensive chronic kidney disease with stage 1 through stage 4 chronic kidney disease, or unspecified chronic kidney disease: Secondary | ICD-10-CM | POA: Diagnosis not present

## 2016-06-21 DIAGNOSIS — Z85118 Personal history of other malignant neoplasm of bronchus and lung: Secondary | ICD-10-CM | POA: Diagnosis not present

## 2016-06-21 DIAGNOSIS — J441 Chronic obstructive pulmonary disease with (acute) exacerbation: Secondary | ICD-10-CM | POA: Diagnosis not present

## 2016-06-21 DIAGNOSIS — Z7984 Long term (current) use of oral hypoglycemic drugs: Secondary | ICD-10-CM | POA: Diagnosis not present

## 2016-06-21 DIAGNOSIS — E1122 Type 2 diabetes mellitus with diabetic chronic kidney disease: Secondary | ICD-10-CM | POA: Diagnosis not present

## 2016-06-21 DIAGNOSIS — H409 Unspecified glaucoma: Secondary | ICD-10-CM | POA: Diagnosis not present

## 2016-06-21 DIAGNOSIS — K219 Gastro-esophageal reflux disease without esophagitis: Secondary | ICD-10-CM | POA: Diagnosis not present

## 2016-06-21 DIAGNOSIS — N189 Chronic kidney disease, unspecified: Secondary | ICD-10-CM | POA: Diagnosis not present

## 2016-06-26 DIAGNOSIS — Z85118 Personal history of other malignant neoplasm of bronchus and lung: Secondary | ICD-10-CM | POA: Diagnosis not present

## 2016-06-26 DIAGNOSIS — N189 Chronic kidney disease, unspecified: Secondary | ICD-10-CM | POA: Diagnosis not present

## 2016-06-26 DIAGNOSIS — J9611 Chronic respiratory failure with hypoxia: Secondary | ICD-10-CM | POA: Diagnosis not present

## 2016-06-26 DIAGNOSIS — Z7984 Long term (current) use of oral hypoglycemic drugs: Secondary | ICD-10-CM | POA: Diagnosis not present

## 2016-06-26 DIAGNOSIS — E1122 Type 2 diabetes mellitus with diabetic chronic kidney disease: Secondary | ICD-10-CM | POA: Diagnosis not present

## 2016-06-26 DIAGNOSIS — I129 Hypertensive chronic kidney disease with stage 1 through stage 4 chronic kidney disease, or unspecified chronic kidney disease: Secondary | ICD-10-CM | POA: Diagnosis not present

## 2016-06-26 DIAGNOSIS — H409 Unspecified glaucoma: Secondary | ICD-10-CM | POA: Diagnosis not present

## 2016-06-26 DIAGNOSIS — K219 Gastro-esophageal reflux disease without esophagitis: Secondary | ICD-10-CM | POA: Diagnosis not present

## 2016-06-26 DIAGNOSIS — J441 Chronic obstructive pulmonary disease with (acute) exacerbation: Secondary | ICD-10-CM | POA: Diagnosis not present

## 2016-06-27 DIAGNOSIS — E1122 Type 2 diabetes mellitus with diabetic chronic kidney disease: Secondary | ICD-10-CM | POA: Diagnosis not present

## 2016-06-27 DIAGNOSIS — K219 Gastro-esophageal reflux disease without esophagitis: Secondary | ICD-10-CM | POA: Diagnosis not present

## 2016-06-27 DIAGNOSIS — N189 Chronic kidney disease, unspecified: Secondary | ICD-10-CM | POA: Diagnosis not present

## 2016-06-27 DIAGNOSIS — Z7984 Long term (current) use of oral hypoglycemic drugs: Secondary | ICD-10-CM | POA: Diagnosis not present

## 2016-06-27 DIAGNOSIS — J9611 Chronic respiratory failure with hypoxia: Secondary | ICD-10-CM | POA: Diagnosis not present

## 2016-06-27 DIAGNOSIS — Z85118 Personal history of other malignant neoplasm of bronchus and lung: Secondary | ICD-10-CM | POA: Diagnosis not present

## 2016-06-27 DIAGNOSIS — J441 Chronic obstructive pulmonary disease with (acute) exacerbation: Secondary | ICD-10-CM | POA: Diagnosis not present

## 2016-06-27 DIAGNOSIS — I129 Hypertensive chronic kidney disease with stage 1 through stage 4 chronic kidney disease, or unspecified chronic kidney disease: Secondary | ICD-10-CM | POA: Diagnosis not present

## 2016-06-27 DIAGNOSIS — H409 Unspecified glaucoma: Secondary | ICD-10-CM | POA: Diagnosis not present

## 2016-06-28 ENCOUNTER — Inpatient Hospital Stay: Payer: Commercial Managed Care - HMO

## 2016-06-28 ENCOUNTER — Encounter: Payer: Self-pay | Admitting: Internal Medicine

## 2016-06-28 ENCOUNTER — Inpatient Hospital Stay: Payer: Commercial Managed Care - HMO | Attending: Internal Medicine | Admitting: Internal Medicine

## 2016-06-28 ENCOUNTER — Other Ambulatory Visit: Payer: Self-pay | Admitting: *Deleted

## 2016-06-28 VITALS — BP 125/75 | HR 11 | Temp 98.0°F | Resp 21 | Ht 68.0 in | Wt 144.3 lb

## 2016-06-28 DIAGNOSIS — J45909 Unspecified asthma, uncomplicated: Secondary | ICD-10-CM | POA: Diagnosis not present

## 2016-06-28 DIAGNOSIS — K219 Gastro-esophageal reflux disease without esophagitis: Secondary | ICD-10-CM | POA: Insufficient documentation

## 2016-06-28 DIAGNOSIS — C649 Malignant neoplasm of unspecified kidney, except renal pelvis: Secondary | ICD-10-CM | POA: Insufficient documentation

## 2016-06-28 DIAGNOSIS — E119 Type 2 diabetes mellitus without complications: Secondary | ICD-10-CM | POA: Diagnosis not present

## 2016-06-28 DIAGNOSIS — C3411 Malignant neoplasm of upper lobe, right bronchus or lung: Secondary | ICD-10-CM | POA: Diagnosis not present

## 2016-06-28 DIAGNOSIS — G4700 Insomnia, unspecified: Secondary | ICD-10-CM | POA: Diagnosis not present

## 2016-06-28 DIAGNOSIS — Z7951 Long term (current) use of inhaled steroids: Secondary | ICD-10-CM | POA: Insufficient documentation

## 2016-06-28 DIAGNOSIS — C8300 Small cell B-cell lymphoma, unspecified site: Secondary | ICD-10-CM

## 2016-06-28 DIAGNOSIS — Z9981 Dependence on supplemental oxygen: Secondary | ICD-10-CM | POA: Insufficient documentation

## 2016-06-28 DIAGNOSIS — Z23 Encounter for immunization: Secondary | ICD-10-CM | POA: Insufficient documentation

## 2016-06-28 DIAGNOSIS — R591 Generalized enlarged lymph nodes: Secondary | ICD-10-CM | POA: Diagnosis not present

## 2016-06-28 DIAGNOSIS — Z8042 Family history of malignant neoplasm of prostate: Secondary | ICD-10-CM | POA: Diagnosis not present

## 2016-06-28 DIAGNOSIS — I10 Essential (primary) hypertension: Secondary | ICD-10-CM | POA: Insufficient documentation

## 2016-06-28 DIAGNOSIS — Z5112 Encounter for antineoplastic immunotherapy: Secondary | ICD-10-CM | POA: Diagnosis not present

## 2016-06-28 DIAGNOSIS — C3490 Malignant neoplasm of unspecified part of unspecified bronchus or lung: Secondary | ICD-10-CM

## 2016-06-28 DIAGNOSIS — C3492 Malignant neoplasm of unspecified part of left bronchus or lung: Secondary | ICD-10-CM

## 2016-06-28 DIAGNOSIS — Z9221 Personal history of antineoplastic chemotherapy: Secondary | ICD-10-CM | POA: Diagnosis not present

## 2016-06-28 DIAGNOSIS — C641 Malignant neoplasm of right kidney, except renal pelvis: Secondary | ICD-10-CM

## 2016-06-28 DIAGNOSIS — Z85038 Personal history of other malignant neoplasm of large intestine: Secondary | ICD-10-CM | POA: Insufficient documentation

## 2016-06-28 DIAGNOSIS — Z87891 Personal history of nicotine dependence: Secondary | ICD-10-CM | POA: Diagnosis not present

## 2016-06-28 DIAGNOSIS — Z79899 Other long term (current) drug therapy: Secondary | ICD-10-CM | POA: Diagnosis not present

## 2016-06-28 DIAGNOSIS — Z8673 Personal history of transient ischemic attack (TIA), and cerebral infarction without residual deficits: Secondary | ICD-10-CM | POA: Diagnosis not present

## 2016-06-28 DIAGNOSIS — J449 Chronic obstructive pulmonary disease, unspecified: Secondary | ICD-10-CM | POA: Diagnosis not present

## 2016-06-28 LAB — COMPREHENSIVE METABOLIC PANEL
ALT: 12 U/L — ABNORMAL LOW (ref 17–63)
ANION GAP: 9 (ref 5–15)
AST: 17 U/L (ref 15–41)
Albumin: 4.4 g/dL (ref 3.5–5.0)
Alkaline Phosphatase: 78 U/L (ref 38–126)
BILIRUBIN TOTAL: 0.6 mg/dL (ref 0.3–1.2)
BUN: 16 mg/dL (ref 6–20)
CHLORIDE: 90 mmol/L — AB (ref 101–111)
CO2: 36 mmol/L — ABNORMAL HIGH (ref 22–32)
Calcium: 9.5 mg/dL (ref 8.9–10.3)
Creatinine, Ser: 1.08 mg/dL (ref 0.61–1.24)
GFR calc Af Amer: >60 mL/min (ref >60)
Glucose, Bld: 161 mg/dL — ABNORMAL HIGH (ref 65–99)
POTASSIUM: 4.1 mmol/L (ref 3.5–5.1)
Sodium: 135 mmol/L (ref 135–145)
TOTAL PROTEIN: 7.8 g/dL (ref 6.5–8.1)

## 2016-06-28 LAB — CBC WITH DIFFERENTIAL/PLATELET
Basophils Absolute: 0 10*3/uL (ref 0–0.1)
Basophils Relative: 0 %
Eosinophils Absolute: 0 10*3/uL (ref 0–0.7)
Eosinophils Relative: 0 %
HEMATOCRIT: 30.6 % — AB (ref 40.0–52.0)
Hemoglobin: 9.8 g/dL — ABNORMAL LOW (ref 13.0–18.0)
LYMPHS ABS: 2.1 10*3/uL (ref 1.0–3.6)
LYMPHS PCT: 13 %
MCH: 26.5 pg (ref 26.0–34.0)
MCHC: 32.1 g/dL (ref 32.0–36.0)
MCV: 82.4 fL (ref 80.0–100.0)
MONO ABS: 0.6 10*3/uL (ref 0.2–1.0)
MONOS PCT: 4 %
NEUTROS ABS: 13.7 10*3/uL — AB (ref 1.4–6.5)
Neutrophils Relative %: 83 %
Platelets: 207 10*3/uL (ref 150–440)
RBC: 3.71 MIL/uL — ABNORMAL LOW (ref 4.40–5.90)
RDW: 16.8 % — AB (ref 11.5–14.5)
WBC: 16.5 10*3/uL — ABNORMAL HIGH (ref 3.8–10.6)

## 2016-06-28 LAB — LACTATE DEHYDROGENASE: LDH: 164 U/L (ref 98–192)

## 2016-06-28 MED ORDER — HYDROCODONE-ACETAMINOPHEN 5-325 MG PO TABS: 1.0000 | ORAL_TABLET | Freq: Four times a day (QID) | ORAL | 0 refills | Status: DC | PRN

## 2016-06-28 NOTE — Assessment & Plan Note (Addendum)
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 Hemoglobin approximately 8. Recommend starting treatment with Craig [name]- discussed the potential side effects including but not limited to infusion reaction; or infections etc.  # Lung ca- history of local recurrence; currently on surveillance most recent imaging negative for any progression.   #  Kidney cancer status post cryoablation most recent imaging August 2017-no clinical evidence of progression.  Above plan of care was discussed the patient in detail. He agrees.   # Check CBC CMP and LDH; also hepatitis studies- plan follow-up in approximately 2 weeks with cycle #1 day 15 of treatment.

## 2016-06-28 NOTE — Progress Notes (Signed)
START ON PATHWAY REGIMEN - Lymphoma and CLL  [id number]: Obinutuzumab + Chlorambucil q28 Days x 6 Cycles   A cycle is every 28 days:     Obinutuzumab [facility])) 100 mg flat dose in 100 mL NS IV over 4 hours on day 1 cycle 1 only. Dose Mod: None     Obinutuzumab (Gazyva(R)) 900 mg flat dose in 250 mL NS IV titrated to a max rate of 400 mg/hr on day 2 cycle 1 only. Initial rate of 50 mg/hr may be increased by 50 mg/hr every 30 minutes if tolerated. Dose Mod: None     Obinutuzumab (Gazyva(R)) 1000 mg flat dose in 250 mL NS IV titrated to a max rate of 400 mg/hr on day 8 and day 15 of cycle 1 only.  Initial rate of 100 mg/hr may be increased by 100 mg/hr every 30 minutes if tolerated. Dose Mod: None     Obinutuzumab (Gazyva(R)) 1000 mg flat dose in 250 mL NS IV titrated to a max rate of 400 mg/hr on day 1 only of cycles 2 through 6. Initial rate of 100 mg/hr may be increased by 100 mg/hr every 30 min if tolerated. Dose Mod: None     Chlorambucil (Leukeran(R)) 0.5 mg/kg Orally on days 1 and 15 of each cycle.  Round to the nearest 2 mg (tablet size). Dose Mod: None Additional Orders: Prescribing info recommends antimicrobial prophylaxis in neutropenic patients and consideration of antiviral/antifungal.  **Always confirm dose/schedule in your pharmacy ordering system**    Patient Characteristics: Small Lymphocytic Leukemia (SLL), First Line, Treatment Indicated, 17p del (-), Frail Patient Disease Type: Small Lymphocytic Leukemia (SLL) Disease Type: Not Applicable [place] Stage: IIIA Line of therapy: First Line Treatment Indicated? Treatment Indicated* 17p Deletion Status: Negative Fit or Frail* Patient? Frail Patient*  Intent of Therapy: Non-Curative / Palliative Intent, Discussed with Patient

## 2016-06-28 NOTE — Progress Notes (Signed)
PET and CT in last month.  SOB on 3L o2 via [place].

## 2016-06-28 NOTE — Progress Notes (Signed)
[place] OFFICE PROGRESS NOTE  Patient Care Team: [name], MD as PCP - General (Family Medicine)  Lung cancer, upper lobe [facility])   Staging form: Lung, AJCC 7th Edition   - Clinical stage from 06/15/2010: yT2, N2, M0 - Signed by [name], NP on 02/03/2015   - Pathologic stage from 04/04/2014: yT2, N2, M1 - Signed by [name], NP on 02/03/2015  Lymphoma, small lymphocytic ([place])   Staging form: Lymphoid Neoplasms, AJCC 6th Edition   - Clinical stage from 10/15/2013: Stage II - Signed by [name], NP on 02/03/2015  Renal cell cancer [facility])   Staging form: Kidney, AJCC 7th Edition   - Clinical stage from 02/03/2015: Stage I (yT1a, N0, M0) - Signed by [name], NP on 02/03/2015   Oncology History   1. Right upper lobe lung mass biopsies positive for adenocarcinoma; PET- T2, N2, M0;  stage IIIA;  carboplatinum Alimta and Avastin;  [april  9th of 2013]  Patient had an allergic reaction to carboplatinum with acute bronchospasm in May of 2013; carbo-discontinued.; Maintenance Alimta and Avastin  from June of 2013 hold chemotherapy because of increasing shortness of breath (in July, 2013); VARISTRAT-GOOD; Started on [place] February 3 about 2014; Diarrhea 4 days after Tarceva was started.  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   # SEP 2017- GAZYVA  # RIGHT KIDNEY CA- RCC [s/p Bx]- s/p Cryo [Dr.Yamagat; IR; 2016]   #  Poor pulmonary function; 3 L home O2.     Lung cancer, upper lobe ([place])   02/03/2015 Initial Diagnosis    Lung cancer, upper lobe       Lymphoma, small lymphocytic  ([place])   02/03/2015 Initial Diagnosis    Lymphoma, small lymphocytic (HCC)      Cancer of lung ([place])   12/21/2015 Initial Diagnosis    Cancer of lung ([place])      Primary cancer of right upper lobe of lung ([place])   06/01/2016 Initial Diagnosis    Primary cancer of right upper lobe of lung [facility])       This is my first interaction with the patient as patient's primary oncologist has been Dr.Choksi. I reviewed the patient's prior charts/pertinent labs/imaging in detail; findings are summarized above.    INTERVAL HISTORY:  Craig [name]. 65 y.o.  male pleasant patientHistory of chronic respiratory failure on home O2 with above history ofMultiple malignancies- lung with recurrence- treatment currently on hold; also kidney cancer status post cryoablation and also SLL-currently on surveillance is here for follow-up.  Patient's most recent scan showed progressive disease in the retroperitoneum-with bulky adenopathy.  Patient complains of intermittent pain in his abdomen and also the chest wall. He has been taking oxycodone. He denies any weight loss. Denies any night sweats. His chronic shortness of breath on 3 L of oxygen.   REVIEW OF SYSTEMS:  A complete 10 point review of system is done which is negative except mentioned above/history of present illness.   PAST MEDICAL HISTORY :  Past Medical History:  Diagnosis Date  . Asthma   .  Cancer ([place])   . Colon cancer ([place])   . COPD (chronic obstructive pulmonary disease) ([place])   . Diabetes mellitus without complication ([place])   . Difficulty sleeping   . Dysrhythmia   . GERD (gastroesophageal reflux disease)   . Glaucoma   . History of bronchitis   . Hypertension   . Lung cancer ([place])    right upper lobe mass positive for adenocarcinoma  . Lymphoma (H[facility])    low grade  . Renal cancer ([place])   . Respiratory failure ([place]) 07/12/2010   acute  . Right renal mass   . Shortness of breath dyspnea   . Stroke ([place]) 1990'S   NO RESIDUAL  PROBLEMS  . Supplemental oxygen dependent    USES 3 LITERS CONTINUOUSLY    PAST SURGICAL HISTORY :  History reviewed. No pertinent surgical history.  FAMILY HISTORY :   Family History  Problem Relation Age of Onset  . Diabetes Mellitus II Mother   . Heart disease Father   . Hypertension Father   . Diabetes Mellitus II Sister   . Prostate cancer Brother     SOCIAL HISTORY:   Social History  Substance Use Topics  . Smoking status: Former Smoker    Packs/day: 1.00    Years: 30.00    Types: Cigarettes    Start date: 12/28/1968    Quit date: 01/13/2010  . Smokeless tobacco: Never Used  . Alcohol use No     Comment: Stopped 2001    ALLERGIES:  is allergic to iodinated diagnostic agents and nexium [esomeprazole magnesium].  MEDICATIONS:  Current Outpatient Prescriptions  Medication Sig Dispense Refill  . albuterol (PROVENTIL HFA;VENTOLIN HFA) 108 (90 BASE) MCG/ACT inhaler Inhale 2 puffs into the lungs every 6 (six) hours as needed for wheezing or shortness of breath.     Marland Kitchen albuterol (PROVENTIL) (2.5 MG/3ML) 0.083% nebulizer solution USE 1 VIAL IN NEBULIZER EVERY 3-4 HOURS AS NEEDED (Patient taking differently: USE 1 VIAL IN NEBULIZER EVERY 3-4 HOURS AS NEEDED FOR SHORTNESS OF BREATH OR WHEEZING.) 75 mL 3  . budesonide-formoterol (SYMBICORT) 160-4.5 MCG/ACT inhaler Inhale 2 puffs into the lungs 2 (two) times daily.     Marland Kitchen dextromethorphan (DELSYM) 30 MG/5ML liquid Take 5 mLs (30 mg total) by mouth 2 (two) times daily. 89 mL 0  . diphenhydrAMINE (BENADRYL) 25 MG tablet Take 2 tablets (50 mg total) by mouth as directed. 2 tablet 0  . glipiZIDE (GLUCOTROL) 10 MG tablet TAKE 1 TABLET BY MOUTH DAILY 30 tablet 6  . loperamide (IMODIUM A-D) 2 MG tablet Take 2 mg by mouth 4 (four) times daily as needed for diarrhea or loose stools. Reported on 12/21/2015    . losartan-hydrochlorothiazide (HYZAAR) 50-12.5 MG per tablet Take 1 tablet by mouth daily.    . pioglitazone (ACTOS) 30 MG tablet Take 30  mg by mouth daily.    . predniSONE (DELTASONE) 10 MG tablet TAKE 1 TABLET (10 MG TOTAL) BY MOUTH DAILY WITH BREAKFAST. 30 tablet 3  . temazepam (RESTORIL) 7.5 MG capsule Take 7.5-15 mg by mouth at bedtime as needed for sleep. Reported on 12/21/2015  2  . theophylline (UNIPHYL) 400 MG 24 hr tablet Take 400 mg by mouth daily as needed (For breathing.).     Marland Kitchen tiotropium (SPIRIVA) 18 MCG inhalation capsule Place 18 mcg into inhaler and inhale daily.     Marland Kitchen HYDROcodone-acetaminophen (NORCO/VICODIN) 5-325 MG tablet Take 1 tablet by mouth every 6 (six) hours as needed for moderate pain. Please Note  new directions 90 tablet 0   No current facility-administered medications for this visit.    Facility-Administered Medications Ordered in Other Visits  Medication Dose Route Frequency Provider Last Rate Last Dose  . sodium chloride 0.9 % injection 10 mL  10 mL Intracatheter PRN Forest Gleason, MD   10 mL at 04/28/15 1410  . sodium chloride 0.9 % injection 10 mL  10 mL Intravenous PRN Forest Gleason, MD   10 mL at 05/12/15 1350    PHYSICAL EXAMINATION: ECOG PERFORMANCE STATUS: 2 - Symptomatic, <50% confined to bed  BP 125/75 (BP Location: Left Arm, Patient Position: Sitting)   Pulse (!) 11   Temp 98 F (36.7 C) (Tympanic)   Resp (!) 21   Ht '5\' 8"'$  (1.727 m)   Wt 144 lb 4.8 oz (65.5 kg)   SpO2 92%   BMI 21.94 kg/m   Filed Weights   06/28/16 1345  Weight: 144 lb 4.8 oz (65.5 kg)    GENERAL: Well-nourished well-developed; Alert, no distress and comfortable.   Alone. He is on home oxygen. He is in a wheelchair.  EYES: no pallor or icterus OROPHARYNX: no thrush or ulceration; good dentition  NECK: supple, no masses felt LYMPH:  no palpable lymphadenopathy in the cervical, axillary or inguinal regions LUNGS:Bilateral decreased air entry to auscultationn and  No wheeze or crackles HEART/CVS: regular rate & rhythm and no murmurs; No lower extremity edema ABDOMEN:abdomen soft, non-tender and normal bowel  sounds Musculoskeletal:no cyanosis of digits and no clubbing  PSYCH: alert & oriented x 3 with fluent speech NEURO: no focal motor/sensory deficits SKIN:  no rashes or significant lesions  LABORATORY DATA:  I have reviewed the data as listed    Component Value Date/Time   NA 135 06/28/2016 1455   NA 139 01/17/2015 0913   K 4.1 06/28/2016 1455   K 3.8 01/17/2015 0913   CL 90 (L) 06/28/2016 1455   CL 99 (L) 01/17/2015 0913   CO2 36 (H) 06/28/2016 1455   CO2 35 (H) 01/17/2015 0913   GLUCOSE 161 (H) 06/28/2016 1455   GLUCOSE 169 (H) 01/17/2015 0913   BUN 16 06/28/2016 1455   BUN 15 01/17/2015 0913   CREATININE 1.08 06/28/2016 1455   CREATININE 1.23 01/17/2015 0913   CALCIUM 9.5 06/28/2016 1455   CALCIUM 8.8 (L) 01/17/2015 0913   PROT 7.8 06/28/2016 1455   PROT 6.6 01/17/2015 0913   ALBUMIN 4.4 06/28/2016 1455   ALBUMIN 3.7 01/17/2015 0913   AST 17 06/28/2016 1455   AST 20 01/17/2015 0913   ALT 12 (L) 06/28/2016 1455   ALT 15 (L) 01/17/2015 0913   ALKPHOS 78 06/28/2016 1455   ALKPHOS 76 01/17/2015 0913   BILITOT 0.6 06/28/2016 1455   BILITOT 0.5 01/17/2015 0913   GFRNONAA >60 06/28/2016 1455   GFRNONAA >60 01/17/2015 0913   GFRAA >60 06/28/2016 1455   GFRAA >60 01/17/2015 0913    No results found for: SPEP, UPEP  Lab Results  Component Value Date   WBC 16.5 (H) 06/28/2016   NEUTROABS 13.7 (H) 06/28/2016   HGB 9.8 (L) 06/28/2016   HCT 30.6 (L) 06/28/2016   MCV 82.4 06/28/2016   PLT 207 06/28/2016      Chemistry      Component Value Date/Time   NA 135 06/28/2016 1455   NA 139 01/17/2015 0913   K 4.1 06/28/2016 1455   K 3.8 01/17/2015 0913   CL 90 (L) 06/28/2016 1455   CL 99 (L) 01/17/2015  0913   CO2 36 (H) 06/28/2016 1455   CO2 35 (H) 01/17/2015 0913   BUN 16 06/28/2016 1455   BUN 15 01/17/2015 0913   CREATININE 1.08 06/28/2016 1455   CREATININE 1.23 01/17/2015 0913      Component Value Date/Time   CALCIUM 9.5 06/28/2016 1455   CALCIUM 8.8 (L)  01/17/2015 0913   ALKPHOS 78 06/28/2016 1455   ALKPHOS 76 01/17/2015 0913   AST 17 06/28/2016 1455   AST 20 01/17/2015 0913   ALT 12 (L) 06/28/2016 1455   ALT 15 (L) 01/17/2015 0913   BILITOT 0.6 06/28/2016 1455   BILITOT 0.5 01/17/2015 0913       RADIOGRAPHIC STUDIES: I have personally reviewed the radiological images as listed and agreed with the findings in the report. No results found.   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 Hemoglobin approximately 8. Recommend starting treatment with Dyann Kief- discussed the potential side effects including but not limited to infusion reaction; or infections etc.  # Lung ca- history of local recurrence; currently on surveillance most recent imaging negative for any progression.   #  Kidney cancer status post cryoablation most recent imaging August 2017-no clinical evidence of progression.  Above plan of care was discussed the patient in detail. He agrees.   # Check CBC CMP and LDH; also hepatitis studies- plan follow-up in approximately 2 weeks with cycle #1 day 15 of treatment.    Orders Placed This Encounter  Procedures  . CBC with Differential/Platelet    Standing Status:   Future    Standing Expiration Date:   08/02/2017  . Comprehensive metabolic panel    Standing Status:   Future    Standing Expiration Date:   08/02/2017  . Lactate dehydrogenase    Standing Status:   Future    Number of Occurrences:   1    Standing Expiration Date:   08/02/2017  . Hepatitis B core antibody, IgM    Standing Status:   Future    Number of Occurrences:   1    Standing Expiration Date:   08/02/2017  . Hepatitis B surface antigen    Standing Status:   Future    Number of Occurrences:   1    Standing Expiration Date:   08/02/2017   All questions were answered. The patient knows to call the clinic with any problems,  questions or concerns.      [name], MD 07/01/2016 12:32 PM

## 2016-06-29 ENCOUNTER — Other Ambulatory Visit: Payer: Self-pay

## 2016-06-29 ENCOUNTER — Inpatient Hospital Stay: Payer: Commercial Managed Care - HMO | Admitting: Internal Medicine

## 2016-06-29 ENCOUNTER — Inpatient Hospital Stay: Payer: Commercial Managed Care - HMO

## 2016-06-29 DIAGNOSIS — C3410 Malignant neoplasm of upper lobe, unspecified bronchus or lung: Secondary | ICD-10-CM

## 2016-06-29 LAB — HEPATITIS B SURFACE ANTIGEN: Hepatitis B Surface Ag: NEGATIVE

## 2016-06-29 LAB — HEPATITIS B CORE ANTIBODY, IGM: Hep B C IgM: NEGATIVE

## 2016-07-02 ENCOUNTER — Other Ambulatory Visit: Payer: Self-pay | Admitting: Internal Medicine

## 2016-07-02 ENCOUNTER — Other Ambulatory Visit: Payer: Self-pay

## 2016-07-02 ENCOUNTER — Emergency Department: Payer: Commercial Managed Care - HMO

## 2016-07-02 ENCOUNTER — Encounter: Payer: Self-pay | Admitting: Emergency Medicine

## 2016-07-02 ENCOUNTER — Inpatient Hospital Stay: Payer: Commercial Managed Care - HMO

## 2016-07-02 ENCOUNTER — Ambulatory Visit: Payer: Commercial Managed Care - HMO

## 2016-07-02 ENCOUNTER — Inpatient Hospital Stay
Admission: EM | Admit: 2016-07-02 | Discharge: 2016-07-03 | DRG: 189 | Disposition: A | Discharge status: Home / Self Care | Payer: Commercial Managed Care - HMO | Attending: Internal Medicine | Admitting: Internal Medicine

## 2016-07-02 VITALS — BP 163/96 | HR 128 | Temp 97.6°F | Resp 32

## 2016-07-02 DIAGNOSIS — Z8673 Personal history of transient ischemic attack (TIA), and cerebral infarction without residual deficits: Secondary | ICD-10-CM

## 2016-07-02 DIAGNOSIS — I10 Essential (primary) hypertension: Secondary | ICD-10-CM | POA: Diagnosis not present

## 2016-07-02 DIAGNOSIS — Z5112 Encounter for antineoplastic immunotherapy: Secondary | ICD-10-CM | POA: Diagnosis not present

## 2016-07-02 DIAGNOSIS — Z85118 Personal history of other malignant neoplasm of bronchus and lung: Secondary | ICD-10-CM | POA: Diagnosis not present

## 2016-07-02 DIAGNOSIS — Z8249 Family history of ischemic heart disease and other diseases of the circulatory system: Secondary | ICD-10-CM | POA: Diagnosis not present

## 2016-07-02 DIAGNOSIS — Z87891 Personal history of nicotine dependence: Secondary | ICD-10-CM

## 2016-07-02 DIAGNOSIS — R000 Tachycardia, unspecified: Secondary | ICD-10-CM | POA: Diagnosis present

## 2016-07-02 DIAGNOSIS — Z8042 Family history of malignant neoplasm of prostate: Secondary | ICD-10-CM

## 2016-07-02 DIAGNOSIS — J441 Chronic obstructive pulmonary disease with (acute) exacerbation: Secondary | ICD-10-CM | POA: Diagnosis present

## 2016-07-02 DIAGNOSIS — Z833 Family history of diabetes mellitus: Secondary | ICD-10-CM | POA: Diagnosis not present

## 2016-07-02 DIAGNOSIS — C8300 Small cell B-cell lymphoma, unspecified site: Secondary | ICD-10-CM | POA: Diagnosis not present

## 2016-07-02 DIAGNOSIS — Z9981 Dependence on supplemental oxygen: Secondary | ICD-10-CM

## 2016-07-02 DIAGNOSIS — T451X5A Adverse effect of antineoplastic and immunosuppressive drugs, initial encounter: Secondary | ICD-10-CM | POA: Diagnosis present

## 2016-07-02 DIAGNOSIS — Z888 Allergy status to other drugs, medicaments and biological substances status: Secondary | ICD-10-CM | POA: Diagnosis not present

## 2016-07-02 DIAGNOSIS — C3490 Malignant neoplasm of unspecified part of unspecified bronchus or lung: Secondary | ICD-10-CM | POA: Diagnosis not present

## 2016-07-02 DIAGNOSIS — C8590 Non-Hodgkin lymphoma, unspecified, unspecified site: Secondary | ICD-10-CM | POA: Diagnosis present

## 2016-07-02 DIAGNOSIS — C3411 Malignant neoplasm of upper lobe, right bronchus or lung: Secondary | ICD-10-CM | POA: Diagnosis not present

## 2016-07-02 DIAGNOSIS — R591 Generalized enlarged lymph nodes: Secondary | ICD-10-CM | POA: Diagnosis not present

## 2016-07-02 DIAGNOSIS — K219 Gastro-esophageal reflux disease without esophagitis: Secondary | ICD-10-CM | POA: Diagnosis present

## 2016-07-02 DIAGNOSIS — J449 Chronic obstructive pulmonary disease, unspecified: Secondary | ICD-10-CM | POA: Diagnosis not present

## 2016-07-02 DIAGNOSIS — J9620 Acute and chronic respiratory failure, unspecified whether with hypoxia or hypercapnia: Principal | ICD-10-CM | POA: Diagnosis present

## 2016-07-02 DIAGNOSIS — C649 Malignant neoplasm of unspecified kidney, except renal pelvis: Secondary | ICD-10-CM | POA: Diagnosis not present

## 2016-07-02 DIAGNOSIS — Z79899 Other long term (current) drug therapy: Secondary | ICD-10-CM

## 2016-07-02 DIAGNOSIS — J45909 Unspecified asthma, uncomplicated: Secondary | ICD-10-CM | POA: Diagnosis not present

## 2016-07-02 DIAGNOSIS — Z85038 Personal history of other malignant neoplasm of large intestine: Secondary | ICD-10-CM

## 2016-07-02 DIAGNOSIS — H409 Unspecified glaucoma: Secondary | ICD-10-CM | POA: Diagnosis present

## 2016-07-02 DIAGNOSIS — Z23 Encounter for immunization: Secondary | ICD-10-CM | POA: Diagnosis not present

## 2016-07-02 DIAGNOSIS — E119 Type 2 diabetes mellitus without complications: Secondary | ICD-10-CM | POA: Diagnosis not present

## 2016-07-02 DIAGNOSIS — Z66 Do not resuscitate: Secondary | ICD-10-CM | POA: Diagnosis present

## 2016-07-02 DIAGNOSIS — R0602 Shortness of breath: Secondary | ICD-10-CM | POA: Diagnosis not present

## 2016-07-02 DIAGNOSIS — Z85528 Personal history of other malignant neoplasm of kidney: Secondary | ICD-10-CM | POA: Diagnosis not present

## 2016-07-02 LAB — COMPREHENSIVE METABOLIC PANEL
ALBUMIN: 3.7 g/dL (ref 3.5–5.0)
ALK PHOS: 75 U/L (ref 38–126)
ALT: 12 U/L — ABNORMAL LOW (ref 17–63)
ANION GAP: 8 (ref 5–15)
AST: 23 U/L (ref 15–41)
BUN: 20 mg/dL (ref 6–20)
CALCIUM: 8.9 mg/dL (ref 8.9–10.3)
CO2: 34 mmol/L — ABNORMAL HIGH (ref 22–32)
Chloride: 95 mmol/L — ABNORMAL LOW (ref 101–111)
Creatinine, Ser: 1.17 mg/dL (ref 0.61–1.24)
GFR calc Af Amer: >60 mL/min (ref >60)
GFR calc non Af Amer: >60 mL/min (ref >60)
GLUCOSE: 159 mg/dL — AB (ref 65–99)
POTASSIUM: 4.9 mmol/L (ref 3.5–5.1)
SODIUM: 137 mmol/L (ref 135–145)
Total Bilirubin: 0.5 mg/dL (ref 0.3–1.2)
Total Protein: 7.2 g/dL (ref 6.5–8.1)

## 2016-07-02 LAB — CBC
HEMATOCRIT: 31.6 % — AB (ref 40.0–52.0)
HEMOGLOBIN: 10.3 g/dL — AB (ref 13.0–18.0)
MCH: 27.3 pg (ref 26.0–34.0)
MCHC: 32.7 g/dL (ref 32.0–36.0)
MCV: 83.5 fL (ref 80.0–100.0)
Platelets: 158 10*3/uL (ref 150–440)
RBC: 3.78 MIL/uL — ABNORMAL LOW (ref 4.40–5.90)
RDW: 16.7 % — ABNORMAL HIGH (ref 11.5–14.5)
WBC: 4.4 10*3/uL (ref 3.8–10.6)

## 2016-07-02 LAB — TROPONIN I: Troponin I: <0.03 ng/mL (ref <0.03)

## 2016-07-02 LAB — BLOOD GAS, VENOUS
Acid-Base Excess: 7.3 mmol/L — ABNORMAL HIGH (ref 0.0–2.0)
BICARBONATE: 35.2 mmol/L — AB (ref 20.0–28.0)
FIO2: 0.32
O2 Saturation: 56.9 %
PATIENT TEMPERATURE: 37
PCO2 VEN: 70 mmHg — AB (ref 44.0–60.0)
PH VEN: 7.31 (ref 7.250–7.430)
PO2 VEN: 33 mmHg (ref 32.0–45.0)

## 2016-07-02 LAB — GLUCOSE, CAPILLARY: Glucose-Capillary: 311 mg/dL — ABNORMAL HIGH (ref 65–99)

## 2016-07-02 MED ORDER — PIOGLITAZONE HCL 15 MG PO TABS
30.0000 mg | ORAL_TABLET | Freq: Every day | ORAL | Status: DC
  Administered 2016-07-03: 30 mg via ORAL
  Filled 2016-07-02: qty 2

## 2016-07-02 MED ORDER — HEPARIN SOD (PORK) LOCK FLUSH 100 UNIT/ML IV SOLN: 500.0000 [IU] | Freq: Once | INTRAVENOUS | Status: DC | PRN

## 2016-07-02 MED ORDER — MORPHINE SULFATE (PF) 2 MG/ML IV SOLN
1.0000 mg | INTRAVENOUS | Status: DC | PRN
  Administered 2016-07-02: 1 mg via INTRAVENOUS
  Filled 2016-07-02: qty 1

## 2016-07-02 MED ORDER — POLYETHYLENE GLYCOL 3350 17 G PO PACK: 17.0000 g | PACK | Freq: Every day | ORAL | Status: DC | PRN

## 2016-07-02 MED ORDER — IPRATROPIUM-ALBUTEROL 0.5-2.5 (3) MG/3ML IN SOLN
3.0000 mL | Freq: Once | RESPIRATORY_TRACT | Status: AC
  Administered 2016-07-02: 3 mL via RESPIRATORY_TRACT

## 2016-07-02 MED ORDER — ONDANSETRON HCL 4 MG/2ML IJ SOLN: 4.0000 mg | Freq: Four times a day (QID) | INTRAMUSCULAR | Status: DC | PRN

## 2016-07-02 MED ORDER — IPRATROPIUM-ALBUTEROL 0.5-2.5 (3) MG/3ML IN SOLN
RESPIRATORY_TRACT | Status: AC
  Administered 2016-07-02: 3 mL via RESPIRATORY_TRACT
  Filled 2016-07-02: qty 9

## 2016-07-02 MED ORDER — DEXAMETHASONE SODIUM PHOSPHATE 100 MG/10ML IJ SOLN
20.0000 mg | Freq: Once | INTRAMUSCULAR | Status: AC
  Administered 2016-07-02: 20 mg via INTRAVENOUS
  Filled 2016-07-02: qty 2

## 2016-07-02 MED ORDER — INSULIN ASPART 100 UNIT/ML ~~LOC~~ SOLN
0.0000 [IU] | Freq: Every day | SUBCUTANEOUS | Status: DC
  Administered 2016-07-02: 4 [IU] via SUBCUTANEOUS
  Filled 2016-07-02: qty 4

## 2016-07-02 MED ORDER — TEMAZEPAM 7.5 MG PO CAPS
7.5000 mg | ORAL_CAPSULE | Freq: Every evening | ORAL | Status: DC | PRN
Start: 2016-07-02 — End: 2016-07-03
  Administered 2016-07-03: 15 mg via ORAL
  Filled 2016-07-02 (×2): qty 1

## 2016-07-02 MED ORDER — SODIUM CHLORIDE 0.9 % IV SOLN
100.0000 mg | Freq: Once | INTRAVENOUS | Status: AC
  Administered 2016-07-02: 100 mg via INTRAVENOUS
  Filled 2016-07-02: qty 4

## 2016-07-02 MED ORDER — PIOGLITAZONE HCL 30 MG PO TABS
30.0000 mg | ORAL_TABLET | Freq: Every day | ORAL | Status: DC
  Filled 2016-07-02: qty 1

## 2016-07-02 MED ORDER — GLIPIZIDE 5 MG PO TABS: 5.0000 mg | ORAL_TABLET | Freq: Every day | ORAL | Status: DC

## 2016-07-02 MED ORDER — DIPHENHYDRAMINE HCL 50 MG/ML IJ SOLN
50.0000 mg | Freq: Once | INTRAMUSCULAR | Status: AC
  Administered 2016-07-02: 50 mg via INTRAVENOUS
  Filled 2016-07-02: qty 1

## 2016-07-02 MED ORDER — SODIUM CHLORIDE 0.9% FLUSH
3.0000 mL | Freq: Two times a day (BID) | INTRAVENOUS | Status: DC
  Administered 2016-07-02 (×2): 3 mL via INTRAVENOUS

## 2016-07-02 MED ORDER — SODIUM CHLORIDE 0.9 % IV SOLN: 250.0000 mL | INTRAVENOUS | Status: DC | PRN

## 2016-07-02 MED ORDER — METHYLPREDNISOLONE SODIUM SUCC 125 MG IJ SOLR
60.0000 mg | Freq: Two times a day (BID) | INTRAMUSCULAR | Status: DC
  Administered 2016-07-02: 125 mg via INTRAVENOUS
  Administered 2016-07-02: 60 mg via INTRAVENOUS
  Filled 2016-07-02 (×2): qty 2

## 2016-07-02 MED ORDER — ACETAMINOPHEN 325 MG PO TABS
650.0000 mg | ORAL_TABLET | Freq: Once | ORAL | Status: AC
  Administered 2016-07-02: 650 mg via ORAL
  Filled 2016-07-02: qty 2

## 2016-07-02 MED ORDER — SODIUM CHLORIDE 0.9 % IV SOLN
Freq: Once | INTRAVENOUS | Status: AC
  Administered 2016-07-02: 09:00:00 via INTRAVENOUS
  Filled 2016-07-02: qty 1000

## 2016-07-02 MED ORDER — ONDANSETRON HCL 4 MG PO TABS: 4.0000 mg | ORAL_TABLET | Freq: Four times a day (QID) | ORAL | Status: DC | PRN

## 2016-07-02 MED ORDER — HYDROCODONE-ACETAMINOPHEN 5-325 MG PO TABS
1.0000 | ORAL_TABLET | Freq: Four times a day (QID) | ORAL | Status: DC | PRN
  Administered 2016-07-03: 1 via ORAL
  Filled 2016-07-02: qty 1

## 2016-07-02 MED ORDER — ENOXAPARIN SODIUM 40 MG/0.4ML ~~LOC~~ SOLN
40.0000 mg | SUBCUTANEOUS | Status: DC
  Administered 2016-07-02: 40 mg via SUBCUTANEOUS
  Filled 2016-07-02: qty 0.4

## 2016-07-02 MED ORDER — DIPHENHYDRAMINE HCL 50 MG/ML IJ SOLN
INTRAMUSCULAR | Status: AC
  Administered 2016-07-02: 50 mg
  Filled 2016-07-02: qty 1

## 2016-07-02 MED ORDER — HYDROCORTISONE NA SUCCINATE PF 100 MG IJ SOLR
50.0000 mg | Freq: Once | INTRAMUSCULAR | Status: AC
  Administered 2016-07-02: 50 mg via INTRAVENOUS

## 2016-07-02 MED ORDER — SODIUM CHLORIDE 0.9% FLUSH
10.0000 mL | INTRAVENOUS | Status: DC | PRN
  Administered 2016-07-02: 10 mL
  Filled 2016-07-02: qty 10

## 2016-07-02 MED ORDER — SODIUM CHLORIDE 0.9% FLUSH
3.0000 mL | Freq: Two times a day (BID) | INTRAVENOUS | Status: DC
  Administered 2016-07-02 – 2016-07-03 (×2): 3 mL via INTRAVENOUS

## 2016-07-02 MED ORDER — GLIPIZIDE 5 MG PO TABS
5.0000 mg | ORAL_TABLET | Freq: Every day | ORAL | Status: DC
  Filled 2016-07-02: qty 1

## 2016-07-02 MED ORDER — SODIUM CHLORIDE 0.9 % IV SOLN
Freq: Once | INTRAVENOUS | Status: AC | PRN
  Administered 2016-07-02: 11:00:00 via INTRAVENOUS
  Filled 2016-07-02: qty 1000

## 2016-07-02 MED ORDER — HYDROCORTISONE NA SUCCINATE PF 100 MG IJ SOLR
100.0000 mg | Freq: Once | INTRAMUSCULAR | Status: AC
  Administered 2016-07-02: 100 mg via INTRAVENOUS

## 2016-07-02 MED ORDER — FAMOTIDINE IN NACL 20-0.9 MG/50ML-% IV SOLN
20.0000 mg | Freq: Once | INTRAVENOUS | Status: AC | PRN
  Administered 2016-07-02: 20 mg via INTRAVENOUS

## 2016-07-02 MED ORDER — SODIUM CHLORIDE 0.9% FLUSH: 3.0000 mL | INTRAVENOUS | Status: DC | PRN

## 2016-07-02 MED ORDER — INSULIN ASPART 100 UNIT/ML ~~LOC~~ SOLN
0.0000 [IU] | Freq: Three times a day (TID) | SUBCUTANEOUS | Status: DC
  Administered 2016-07-02: 3 [IU] via SUBCUTANEOUS
  Administered 2016-07-03: 5 [IU] via SUBCUTANEOUS
  Filled 2016-07-02: qty 5
  Filled 2016-07-02: qty 3

## 2016-07-02 MED ORDER — ORAL CARE MOUTH RINSE: 15.0000 mL | Freq: Two times a day (BID) | OROMUCOSAL | Status: DC

## 2016-07-02 MED ORDER — ALBUTEROL SULFATE (2.5 MG/3ML) 0.083% IN NEBU
2.5000 mg | INHALATION_SOLUTION | RESPIRATORY_TRACT | Status: DC | PRN
  Administered 2016-07-02: 2.5 mg via RESPIRATORY_TRACT
  Filled 2016-07-02: qty 3

## 2016-07-02 MED ORDER — METHYLPREDNISOLONE SODIUM SUCC 125 MG IJ SOLR
INTRAMUSCULAR | Status: AC
  Administered 2016-07-02: 125 mg via INTRAVENOUS
  Filled 2016-07-02: qty 2

## 2016-07-02 MED ORDER — ACETAMINOPHEN 325 MG PO TABS: 650.0000 mg | ORAL_TABLET | Freq: Four times a day (QID) | ORAL | Status: DC | PRN

## 2016-07-02 MED ORDER — TIOTROPIUM BROMIDE MONOHYDRATE 18 MCG IN CAPS
18.0000 ug | ORAL_CAPSULE | Freq: Every day | RESPIRATORY_TRACT | Status: DC
  Administered 2016-07-02 – 2016-07-03 (×2): 18 ug via RESPIRATORY_TRACT
  Filled 2016-07-02: qty 5

## 2016-07-02 MED ORDER — IPRATROPIUM-ALBUTEROL 0.5-2.5 (3) MG/3ML IN SOLN
3.0000 mL | RESPIRATORY_TRACT | Status: DC
  Administered 2016-07-02 – 2016-07-03 (×3): 3 mL via RESPIRATORY_TRACT
  Filled 2016-07-02 (×3): qty 3

## 2016-07-02 MED ORDER — SODIUM CHLORIDE 0.9 % IV BOLUS (SEPSIS)
500.0000 mL | Freq: Once | INTRAVENOUS | Status: AC
  Administered 2016-07-02: 500 mL via INTRAVENOUS

## 2016-07-02 MED ORDER — ACETAMINOPHEN 650 MG RE SUPP: 650.0000 mg | Freq: Four times a day (QID) | RECTAL | Status: DC | PRN

## 2016-07-02 MED ORDER — MOMETASONE FURO-FORMOTEROL FUM 200-5 MCG/ACT IN AERO
2.0000 | INHALATION_SPRAY | Freq: Two times a day (BID) | RESPIRATORY_TRACT | Status: DC
  Administered 2016-07-02 – 2016-07-03 (×2): 2 via RESPIRATORY_TRACT
  Filled 2016-07-02: qty 8.8

## 2016-07-02 NOTE — ED Provider Notes (Signed)
[facility] Emergency Department Provider Note  Time seen: 11:49 AM  I have reviewed the triage vital signs and the nursing notes.   HISTORY  Chief Complaint No chief complaint on file.    HPI Craig [name]. is a 65 y.o. male with a past medical history of diabetes, COPD on 3 L via nasal cannula 24/7, hypertension, cancer on chemotherapy who presents the emergency department with acute onset of difficulty breathing. According to the patient receiving a new chemotherapy drug. Approximately 10 minutes into the infusion the patient began feeling acutely short of breath with significant trouble breathing. He was brought immediately to the emergency department for further evaluation. Here the patient denies any chest pain, states shortness of breath moderate in severity. Denies any nausea. Not diaphoretic. Patient states he is not sure if this is his COPD a was a reaction to new medication.  Past Medical History:  Diagnosis Date  . Asthma   . Cancer ([place])   . Colon cancer ([place])   . COPD (chronic obstructive pulmonary disease) ([place])   . Diabetes mellitus without complication ([place])   . Difficulty sleeping   . Dysrhythmia   . GERD (gastroesophageal reflux disease)   . Glaucoma   . History of bronchitis   . Hypertension   . Lung cancer ([place])    right upper lobe mass positive for adenocarcinoma  . Lymphoma (H[facility])    low grade  . Renal cancer ([place])   . Respiratory failure ([place]) 07/12/2010   acute  . Right renal mass   . Shortness of breath dyspnea   . Stroke ([place]) 1990'S   NO RESIDUAL PROBLEMS  . Supplemental oxygen dependent    USES 3 LITERS CONTINUOUSLY    Patient Active Problem List   Diagnosis Date Noted  . Chronic bronchitis ([place]) 06/02/2016  . Chronic renal insufficiency 06/02/2016  . Benign essential HTN 06/02/2016  . Diabetes ([place]) 06/02/2016  . Primary cancer of right upper lobe of lung ([place]) 06/01/2016  . Renal carcinoma ([place]) 02/29/2016  .  Cancer of lung ([place]) 12/21/2015  . COPD exacerbation ([place]) 11/19/2015  . Acute bronchitis 11/19/2015  . SOB (shortness of breath) 11/19/2015  . Acute respiratory distress (H[facility]) 11/19/2015  . Glaucoma 04/15/2015  . 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 BP (high blood pressure) 03/02/2015  . Cannot sleep 03/02/2015  . Controlled type 2 diabetes mellitus without complication ([place]) 08/08/8526  . Lung cancer, upper lobe ([place]) 02/03/2015  . Renal cell cancer ([place]) 02/03/2015  . Lymphoma, small lymphocytic ([place]) 02/03/2015    No past surgical history on file.  Prior to Admission medications   Medication Sig Start Date End Date Taking? Authorizing Provider  albuterol (PROVENTIL HFA;VENTOLIN HFA) 108 (90 BASE) MCG/ACT inhaler Inhale 2 puffs into the lungs every 6 (six) hours as needed for wheezing or shortness of breath.     Historical Provider, MD  albuterol (PROVENTIL) (2.5 MG/3ML) 0.083% nebulizer solution USE 1 VIAL IN NEBULIZER EVERY 3-4 HOURS AS NEEDED Patient taking differently: USE 1 VIAL IN NEBULIZER EVERY 3-4 HOURS AS NEEDED FOR SHORTNESS OF BREATH OR WHEEZING. 07/05/15   Forest Gleason, MD  budesonide-formoterol (SYMBICORT) 160-4.5 MCG/ACT inhaler Inhale 2 puffs into the lungs 2 (two) times daily.     Historical Provider, MD  dextromethorphan (DELSYM) 30 MG/5ML liquid Take 5 mLs (30 mg total) by mouth 2 (two) times daily. 06/02/16   [name], MD  diphenhydrAMINE (BENADRYL) 25 MG tablet Take 2 tablets (50 mg total) by  mouth as directed. 04/12/16   Aletta Edouard, MD  glipiZIDE (GLUCOTROL) 10 MG tablet TAKE 1 TABLET BY MOUTH DAILY 08/16/15   Forest Gleason, MD  HYDROcodone-acetaminophen (NORCO/VICODIN) 5-325 MG tablet Take 1 tablet by mouth every 6 (six) hours as needed for moderate pain. Please Note new directions 06/28/16   [name], MD  loperamide (IMODIUM A-D) 2 MG tablet Take 2 mg by mouth 4 (four) times daily as needed for diarrhea or loose stools. Reported on 12/21/2015    Historical  Provider, MD  losartan-hydrochlorothiazide (HYZAAR) 50-12.5 MG per tablet Take 1 tablet by mouth daily.    Historical Provider, MD  pioglitazone (ACTOS) 30 MG tablet Take 30 mg by mouth daily.    Historical Provider, MD  predniSONE (DELTASONE) 10 MG tablet TAKE 1 TABLET (10 MG TOTAL) BY MOUTH DAILY WITH BREAKFAST. 03/05/16   Forest Gleason, MD  temazepam (RESTORIL) 7.5 MG capsule Take 7.5-15 mg by mouth at bedtime as needed for sleep. Reported on 12/21/2015 05/02/15   Historical Provider, MD  theophylline (UNIPHYL) 400 MG 24 hr tablet Take 400 mg by mouth daily as needed (For breathing.).  11/11/15   Historical Provider, MD  tiotropium (SPIRIVA) 18 MCG inhalation capsule Place 18 mcg into inhaler and inhale daily.     Historical Provider, MD    Allergies  Allergen Reactions  . Iodinated Diagnostic Agents Shortness Of Breath and Nausea And Vomiting    13-hour prep  . Nexium [Esomeprazole Magnesium] Other (See Comments)    headache    Family History  Problem Relation Age of Onset  . Diabetes Mellitus II Mother   . Heart disease Father   . Hypertension Father   . Diabetes Mellitus II Sister   . Prostate cancer Brother     Social History Social History  Substance Use Topics  . Smoking status: Former Smoker    Packs/day: 1.00    Years: 30.00    Types: Cigarettes    Start date: 12/28/1968    Quit date: 01/13/2010  . Smokeless tobacco: Never Used  . Alcohol use No     Comment: Stopped 2001    Review of Systems Constitutional: Negative for fever. Cardiovascular: Negative for chest pain. Respiratory: Moderate shortness breath. Gastrointestinal: Negative for abdominal pain. Negative for vomiting. Musculoskeletal: Negative for back pain. Skin: Negative for rash. No urticaria Neurological: Negative for headache 10-point ROS otherwise negative.  ____________________________________________   PHYSICAL EXAM:  Constitutional: Alert and oriented. Moderate distress due to dyspnea. Eyes:  Normal exam ENT   Head: Normocephalic and atraumatic.   Mouth/Throat: Mucous membranes are moist. Cardiovascular: Normal rate, regular rhythm. No murmur. Left chest port. Respiratory: Moderate distress due to difficulty breathing. Tachypnea. Moderate expiratory wheeze bilaterally. Gastrointestinal: Soft and nontender. No distention.  Musculoskeletal: Nontender with normal range of motion in all extremities. Neurologic:  Normal speech and language. No gross focal neurologic deficits Skin:  Skin is warm, dry and intact. No hives/urticaria. Psychiatric: Mood and affect are normal.   ____________________________________________    EKG  EKG reviewed and interpreted by myself shows sinus tachycardia 134 bpm, narrow QRS, normal axis, largely normal intervals with no concerning ST changes, difficult interpretation given interference from tachypnea.  ____________________________________________    RADIOLOGY  Chest x-ray negative  ____________________________________________   INITIAL IMPRESSION / ASSESSMENT AND PLAN / ED COURSE  Pertinent labs & imaging results that were available during my care of the patient were reviewed by me and considered in my medical decision making (see chart for details).  Patient presents to the emergency department with acute onset of difficulty breathing after receiving a new chemotherapy medication. Patient has expiratory wheezes bilaterally. Patient satting in the upper 90s on 3 L via nasal cannula. Denies any chest pain. We'll treat with DuoNeb's, Solu-Medrol, IV Benadryl, and closely monitor in the emergency department.  Patient remains tachycardic around 130 bpm, remains tachypnea, with mild to moderate expiratory wheeze. I suspect likely COPD exacerbation, possibly caused by new medication/partial allergic reaction. Given the continued tachycardia and tachypnea we'll admit the patient to the hospital for further  treatment. ____________________________________________   FINAL CLINICAL IMPRESSION(S) / ED DIAGNOSES  Dyspnea Medication reaction COPD exacerbation   Harvest Dark, MD 07/02/16 1445

## 2016-07-02 NOTE — ED Triage Notes (Signed)
Pt arrived via stretcher from [place]. Pt was receiving a new chemotherapy medication ( today and became SOB and tachycardiac. Pt breathing heavily with retractions. Pt on [place] 3l. Pt stating unable to speak on arrival without sounding out of breath.

## 2016-07-02 NOTE — Progress Notes (Signed)
Pt heart rate resting is 118-120. Spoke with Dr Rogue Bussing. Okay to proceed with treatment  1100- Pt c/o of restlessness, observed difficulty breathing. BP elevated, Gazyva stopped, Solu cortef given '100mg'$  and Dr Tish Men notified. '20mg'$  Pepcid given and additional '50mg'$  Solu cortef given IV. Pt continued having increased SOB, increased respirations. No improvement noted. Per Dr Tish Men send to ED. Notified ED at 1133 that we were bringing patient via stetcher.

## 2016-07-02 NOTE — Progress Notes (Signed)
Refusing bipap '@this'$  time

## 2016-07-02 NOTE — ED Notes (Signed)
Nutritional services called about pt's tray.

## 2016-07-02 NOTE — ED Notes (Addendum)
Contacted pt's sister at his request. Pt's sister is calling pt's friend that he live with. Pt is stating that his tightness in his chest he was feeling before has decreased. Pt is still tachycardic and breathing fast.

## 2016-07-02 NOTE — ED Notes (Signed)
Meal tray ordered for pt  

## 2016-07-02 NOTE — H&P (Signed)
[facility] [place] NAME: Craig [name]    MR#:  [id number]  DATE OF BIRTH:  [birth year]  DATE OF ADMISSION:  07/02/2016  PRIMARY CARE PHYSICIAN: [name], MD   REQUESTING/REFERRING PHYSICIAN: Dr. [name]  CHIEF COMPLAINT:   Chief Complaint  Patient presents with  . Shortness of Breath    HISTORY OF PRESENT ILLNESS:  Craig [name]  is a 65 y.o. male with a known history of COPD, lymphoma, lung cancer, hypertension, diabetes underwent chemotherapy earlier today for lymphoma at the cancer center and started having acutely worsening shortness of breath. Patient has noticed some cough and has some mild chronic shortness of breath which has worsened today. Clear sputum. No chest pain. Tachycardic. Patient was given steroids, Benadryl and 1 round of DuoNeb with no improvement. Presently he is acutely short of breath barely able to speak 1 or 2 words.  He mentions he does have some baseline tachycardia of febrile 100. Today his heart rate is 130s to 140s.  PAST MEDICAL HISTORY:   Past Medical History:  Diagnosis Date  . Asthma   . Cancer ([place])   . Colon cancer ([place])   . COPD (chronic obstructive pulmonary disease) ([place])   . Diabetes mellitus without complication ([place])   . Difficulty sleeping   . Dysrhythmia   . GERD (gastroesophageal reflux disease)   . Glaucoma   . History of bronchitis   . Hypertension   . 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 Lymphoma (H[facility])    low grade  . Renal cancer ([place])   . Respiratory failure ([place]) 07/12/2010   acute  . Right renal mass   . Shortness of breath dyspnea   . Stroke ([place]) 1990'S   NO RESIDUAL PROBLEMS  . Supplemental oxygen dependent    USES 3 LITERS CONTINUOUSLY    PAST SURGICAL HISTORY:  History reviewed. No pertinent surgical history.  SOCIAL HISTORY:   Social History  Substance Use Topics  . Smoking status: Former Smoker     Packs/day: 1.00    Years: 30.00    Types: Cigarettes    Start date: 12/28/1968    Quit date: 01/13/2010  . Smokeless tobacco: Never Used  . Alcohol use No     Comment: Stopped 2001    FAMILY HISTORY:   Family History  Problem Relation Age of Onset  . Diabetes Mellitus II Mother   . Heart disease Father   . Hypertension Father   . Diabetes Mellitus II Sister   . Prostate cancer Brother     DRUG ALLERGIES:   Allergies  Allergen Reactions  . Iodinated Diagnostic Agents Shortness Of Breath and Nausea And Vomiting    13-hour prep  . Nexium [Esomeprazole Magnesium] Other (See Comments)    headache    REVIEW OF SYSTEMS:   Review of Systems  Constitutional: Positive for chills and malaise/fatigue. Negative for fever and weight loss.  HENT: Negative for hearing loss and nosebleeds.   Eyes: Negative for blurred vision, double vision and pain.  Respiratory: Positive for cough, sputum production, shortness of breath and wheezing. Negative for hemoptysis.   Cardiovascular: Negative for chest pain, palpitations, orthopnea and leg swelling.  Gastrointestinal: Negative for abdominal pain, constipation, diarrhea, nausea and vomiting.  Genitourinary: Negative for dysuria and hematuria.  Musculoskeletal: Negative for back pain, falls and myalgias.  Skin: Negative for rash.  Neurological: Positive for weakness. Negative for dizziness, tremors, sensory  change, speech change, focal weakness, seizures and headaches.  Endo/Heme/Allergies: Does not bruise/bleed easily.  Psychiatric/Behavioral: Negative for depression and memory loss. The patient is not nervous/anxious.     MEDICATIONS AT HOME:   Prior to Admission medications   Medication Sig Start Date End Date Taking? Authorizing Provider  albuterol (PROVENTIL HFA;VENTOLIN HFA) 108 (90 BASE) MCG/ACT inhaler Inhale 2 puffs into the lungs every 6 (six) hours as needed for wheezing or shortness of breath.    Yes Historical Provider, MD   albuterol (PROVENTIL) (2.5 MG/3ML) 0.083% nebulizer solution Take 2.5 mg by nebulization every 4 (four) hours.   Yes Historical Provider, MD  budesonide-formoterol (SYMBICORT) 160-4.5 MCG/ACT inhaler Inhale 2 puffs into the lungs 2 (two) times daily.    Yes Historical Provider, MD  diphenhydrAMINE (BENADRYL) 25 MG tablet Take 2 tablets (50 mg total) by mouth as directed. Patient taking differently: Take 25 mg by mouth as needed for itching or allergies.  04/12/16  Yes Aletta Edouard, MD  glipiZIDE (GLUCOTROL) 5 MG tablet Take by mouth daily.   Yes Historical Provider, MD  HYDROcodone-acetaminophen (NORCO/VICODIN) 5-325 MG tablet Take 1 tablet by mouth every 6 (six) hours as needed for moderate pain. Please Note new directions 06/28/16  Yes [name], MD  loperamide (IMODIUM A-D) 2 MG tablet Take 2 mg by mouth 4 (four) times daily as needed for diarrhea or loose stools. Reported on 12/21/2015   Yes Historical Provider, MD  losartan-hydrochlorothiazide (HYZAAR) 50-12.5 MG per tablet Take 1 tablet by mouth daily.   Yes Historical Provider, MD  pioglitazone (ACTOS) 30 MG tablet Take 30 mg by mouth daily.   Yes Historical Provider, MD  predniSONE (DELTASONE) 10 MG tablet Take 10 mg by mouth daily with breakfast.   Yes Historical Provider, MD  temazepam (RESTORIL) 7.5 MG capsule Take 7.5-15 mg by mouth at bedtime as needed for sleep. Reported on 12/21/2015 05/02/15  Yes Historical Provider, MD  theophylline (UNIPHYL) 400 MG 24 hr tablet Take 400 mg by mouth daily as needed (For breathing.).  11/11/15  Yes Historical Provider, MD  tiotropium (SPIRIVA) 18 MCG inhalation capsule Place 18 mcg into inhaler and inhale daily.    Yes Historical Provider, MD  dextromethorphan (DELSYM) 30 MG/5ML liquid Take 5 mLs (30 mg total) by mouth 2 (two) times daily. Patient not taking: Reported on 07/02/2016 06/02/16   [name], MD     VITAL SIGNS:  Blood pressure 119/87, pulse (!) 129, resp. rate (!) 37, SpO2 99  %.  PHYSICAL EXAMINATION:  Physical Exam  GENERAL:  65 y.o.-year-old patient lying in the bed with Severe respiratory distress. Looks critically ill. EYES: Pupils equal, round, reactive to light and accommodation. No scleral icterus. Extraocular muscles intact.  HEENT: Head atraumatic, normocephalic. Oropharynx and nasopharynx clear. No oropharyngeal erythema, moist oral mucosa  NECK:  Supple, no jugular venous distention. No thyroid enlargement, no tenderness.  LUNGS: Using accessory muscles. Poor air entry. Bilateral expiratory wheezing. CARDIOVASCULAR: S1, S2 normal. No murmurs, rubs, or gallops.  ABDOMEN: Soft, nontender, nondistended. Bowel sounds present. No organomegaly or mass.  EXTREMITIES: No pedal edema, cyanosis, or clubbing. + 2 pedal & radial pulses b/l.   NEUROLOGIC: Cranial nerves II through XII are intact. No focal Motor or sensory deficits appreciated b/l PSYCHIATRIC: The patient is alert and oriented x 3. Good affect.  SKIN: No obvious rash, lesion, or ulcer.   LABORATORY PANEL:   CBC  Recent Labs Lab 07/02/16 1146  WBC 4.4  HGB 10.3*  HCT 31.6*  PLT 158   ------------------------------------------------------------------------------------------------------------------  Chemistries   Recent Labs Lab 07/02/16 1146  NA 137  K 4.9  CL 95*  CO2 34*  GLUCOSE 159*  BUN 20  CREATININE 1.17  CALCIUM 8.9  AST 23  ALT 12*  ALKPHOS 75  BILITOT 0.5   ------------------------------------------------------------------------------------------------------------------  Cardiac Enzymes  Recent Labs Lab 07/02/16 1146  TROPONINI <0.03   ------------------------------------------------------------------------------------------------------------------  RADIOLOGY:  Dg Chest Portable 1 View  Result Date: 07/02/2016 CLINICAL DATA:  Shortness of breath, tachycardia EXAM: PORTABLE CHEST 1 VIEW COMPARISON:  05/31/2016 FINDINGS: The lungs are hyperinflated likely  secondary to COPD. There is mild right apical fibrosis. There is no focal parenchymal opacity. There is no pleural effusion or pneumothorax. The heart and mediastinal contours are unremarkable. Left-sided Port-A-Cath in satisfactory position. The osseous structures are unremarkable. IMPRESSION: No active disease. Electronically Signed   By: Kathreen Devoid   On: 07/02/2016 12:41     IMPRESSION AND PLAN:   * Acute on chronic respiratory failure due to COPD exacerbation or allergy reaction to chemotherapy Patient has received 1 dose of IV steroids in the emergency room. We'll give him another breathing treatment as he has received only 1 breathing treatments. Check ABG. Bolus 500 ML normal saline for tachycardia If no improvement will need BiPAP Admitted to stepdown Consult pulmonary Patient is DO NOT RESUSCITATE  * Diabetes mellitus type 2 Sliding scale insulin. Continue home medications. Diabetic diet.  * Hypertension Patient has normal blood pressure. Will hold medications at this time. Can restart his blood pressure starts increasing.  * Lung cancer and lymphoma Patient undergoing therapy at the cancer center  * DVT prophylaxis with Lovenox   All the records are reviewed and case discussed with ED provider. Management plans discussed with the patient, family and they are in agreement.  CODE STATUS: DNR  TOTAL CC TIME TAKING CARE OF THIS PATIENT: 40 minutes.   Hillary Bow R M.D on 07/02/2016 at 3:12 PM  Between 7am to 6pm - Pager - [phone]  After 6pm go to [email or web address] - password [email or web address] [facility]  Office  [phone]  CC: Primary care physician; [name], MD  Note: This dictation was prepared with Dragon dictation along with smaller phrase technology. Any transcriptional errors that result from this process are unintentional.

## 2016-07-03 ENCOUNTER — Other Ambulatory Visit: Payer: Self-pay | Admitting: *Deleted

## 2016-07-03 ENCOUNTER — Other Ambulatory Visit: Payer: Self-pay | Admitting: Oncology

## 2016-07-03 ENCOUNTER — Inpatient Hospital Stay: Payer: Commercial Managed Care - HMO

## 2016-07-03 DIAGNOSIS — J441 Chronic obstructive pulmonary disease with (acute) exacerbation: Secondary | ICD-10-CM

## 2016-07-03 LAB — MRSA PCR SCREENING: MRSA BY PCR: NEGATIVE

## 2016-07-03 LAB — BASIC METABOLIC PANEL
Anion gap: 5 (ref 5–15)
BUN: 36 mg/dL — ABNORMAL HIGH (ref 6–20)
CALCIUM: 8.5 mg/dL — AB (ref 8.9–10.3)
CO2: 33 mmol/L — ABNORMAL HIGH (ref 22–32)
Chloride: 97 mmol/L — ABNORMAL LOW (ref 101–111)
Creatinine, Ser: 1.24 mg/dL (ref 0.61–1.24)
GFR, EST NON AFRICAN AMERICAN: 59 mL/min — AB (ref >60)
Glucose, Bld: 161 mg/dL — ABNORMAL HIGH (ref 65–99)
POTASSIUM: 5.5 mmol/L — AB (ref 3.5–5.1)
SODIUM: 135 mmol/L (ref 135–145)

## 2016-07-03 LAB — GLUCOSE, CAPILLARY
GLUCOSE-CAPILLARY: 204 mg/dL — AB (ref 65–99)
Glucose-Capillary: 117 mg/dL — ABNORMAL HIGH (ref 65–99)

## 2016-07-03 LAB — CBC
HEMATOCRIT: 25.7 % — AB (ref 40.0–52.0)
HEMOGLOBIN: 8.4 g/dL — AB (ref 13.0–18.0)
MCH: 26.8 pg (ref 26.0–34.0)
MCHC: 32.6 g/dL (ref 32.0–36.0)
MCV: 82.2 fL (ref 80.0–100.0)
Platelets: 145 10*3/uL — ABNORMAL LOW (ref 150–440)
RBC: 3.13 MIL/uL — ABNORMAL LOW (ref 4.40–5.90)
RDW: 16.6 % — AB (ref 11.5–14.5)
WBC: 6.7 10*3/uL (ref 3.8–10.6)

## 2016-07-03 MED ORDER — IPRATROPIUM-ALBUTEROL 0.5-2.5 (3) MG/3ML IN SOLN: 3.0000 mL | Freq: Four times a day (QID) | RESPIRATORY_TRACT | Status: DC

## 2016-07-03 NOTE — Progress Notes (Signed)
1217 D/C teaching done. Questions answered. Home with significant other.

## 2016-07-03 NOTE — Progress Notes (Signed)
1000 ambulated 250 feet without increase in usual shortness of breath. O2 on at 3 L as at home. Heartrate 114. O2 sats 97%.

## 2016-07-03 NOTE — Patient Outreach (Signed)
   Received telephone call from Mr. Dorion. Explained [facility] program. He is agreeable and verbal consent received.  Explained to him that he will receive post hospital transition of care calls and will be evaluated for monthly home visits. Confirmed Primary Care MD as Dr. Lovie Macadamia.  Confirmed best contact number as [phone]. He denies having trouble with transportation or with obtaining medications. Will make [facility] RNCM aware that Mr. Choplin is agreeable to [employer] Management services.  He expresses appreciation of follow up.    Marthenia Rolling, MSN-Ed, RN,BSN [facility] Liaison [phone]

## 2016-07-03 NOTE — Discharge Summary (Signed)
[facility] [place] NAME: Craig [name]    MR#:  [id number]  DATE OF BIRTH:  Feb 02, 1951  DATE OF ADMISSION:  07/02/2016 ADMITTING PHYSICIAN: Hillary Bow, MD  DATE OF DISCHARGE: 07/03/2016 12:50 PM  PRIMARY CARE PHYSICIAN: [name], MD    ADMISSION DIAGNOSIS:  COPD exacerbation ([place]) [J44.1]  DISCHARGE DIAGNOSIS:  Active Problems:   COPD exacerbation ([place])   SECONDARY DIAGNOSIS:   Past Medical History:  Diagnosis Date  . Asthma   . Cancer ([place])   . Colon cancer ([place])   . COPD (chronic obstructive pulmonary disease) ([place])   . Diabetes mellitus without complication ([place])   . Difficulty sleeping   . Dysrhythmia   . GERD (gastroesophageal reflux disease)   . Glaucoma   . History of bronchitis   . Hypertension   . 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 Lymphoma (H[facility])    low grade  . Renal cancer ([place])   . Respiratory failure ([place]) 07/12/2010   acute  . Right renal mass   . Shortness of breath dyspnea   . Stroke ([place]) 1990'S   NO RESIDUAL PROBLEMS  . Supplemental oxygen dependent    USES 3 LITERS CONTINUOUSLY    HOSPITAL COURSE:   * Acute on chronic respiratory failure due to COPD exacerbation or allergy reaction to chemotherapy Patient has received IV steroids and nebulizer therapy. Bolus 500 ML normal saline for tachycardia Admitted to stepdown Patient is DO NOT RESUSCITATE As per him these whole episode started after receiving new Chemotherapy agent. Resolved with one day of therapy- so d/c next day.  * Diabetes mellitus type 2 Sliding scale insulin. Continue home medications. Diabetic diet.  * Hypertension Patient has normal blood pressure. Resume meds on discharge.  * Lung cancer and lymphoma Patient undergoing therapy at the cancer center  * DVT prophylaxis with Lovenox   DISCHARGE CONDITIONS:   Stable.  CONSULTS OBTAINED:    DRUG ALLERGIES:    Allergies  Allergen Reactions  . Iodinated Diagnostic Agents Shortness Of Breath and Nausea And Vomiting    13-hour prep  . Nexium [Esomeprazole Magnesium] Other (See Comments)    headache    DISCHARGE MEDICATIONS:   Discharge Medication List as of 07/03/2016 11:59 AM    CONTINUE these medications which have NOT CHANGED   Details  albuterol (PROVENTIL HFA;VENTOLIN HFA) 108 (90 BASE) MCG/ACT inhaler Inhale 2 puffs into the lungs every 6 (six) hours as needed for wheezing or shortness of breath. , Until Discontinued, Historical Med    albuterol (PROVENTIL) (2.5 MG/3ML) 0.083% nebulizer solution Take 2.5 mg by nebulization every 4 (four) hours., Historical Med    budesonide-formoterol (SYMBICORT) 160-4.5 MCG/ACT inhaler Inhale 2 puffs into the lungs 2 (two) times daily. , Until Discontinued, Historical Med    dextromethorphan (DELSYM) 30 MG/5ML liquid Take 5 mLs (30 mg total) by mouth 2 (two) times daily., Starting Sat 06/02/2016, Normal    diphenhydrAMINE (BENADRYL) 25 MG tablet Take 2 tablets (50 mg total) by mouth as directed., Starting Thu 04/12/2016, Phone In    glipiZIDE (GLUCOTROL) 5 MG tablet Take by mouth daily., Historical Med    HYDROcodone-acetaminophen (NORCO/VICODIN) 5-325 MG tablet Take 1 tablet by mouth every 6 (six) hours as needed for moderate pain. Please Note new directions, Starting Thu 06/28/2016, Print    loperamide (IMODIUM A-D) 2 MG tablet Take 2 mg by mouth 4 (four) times daily as needed  for diarrhea or loose stools. Reported on 12/21/2015, Until Discontinued, Historical Med    losartan-hydrochlorothiazide (HYZAAR) 50-12.5 MG per tablet Take 1 tablet by mouth daily., Until Discontinued, Historical Med    pioglitazone (ACTOS) 30 MG tablet Take 30 mg by mouth daily., Until Discontinued, Historical Med    predniSONE (DELTASONE) 10 MG tablet Take 10 mg by mouth daily with breakfast., Historical Med    temazepam (RESTORIL) 7.5 MG capsule Take 7.5-15 mg by mouth at  bedtime as needed for sleep. Reported on 12/21/2015, Starting 05/02/2015, Until Discontinued, Historical Med    theophylline (UNIPHYL) 400 MG 24 hr tablet Take 400 mg by mouth daily as needed (For breathing.). , Starting 11/11/2015, Until Discontinued, Historical Med    tiotropium (SPIRIVA) 18 MCG inhalation capsule Place 18 mcg into inhaler and inhale daily. , Until Discontinued, Historical Med         DISCHARGE INSTRUCTIONS:    Follow with PMD in 1-2 weeks.  If you experience worsening of your admission symptoms, develop shortness of breath, life threatening emergency, suicidal or homicidal thoughts you must seek medical attention immediately by calling 911 or calling your MD immediately  if symptoms less severe.  You Must read complete instructions/literature along with all the possible adverse reactions/side effects for all the Medicines you take and that have been prescribed to you. Take any new Medicines after you have completely understood and accept all the possible adverse reactions/side effects.   Please note  You were cared for by a hospitalist during your hospital stay. If you have any questions about your discharge medications or the care you received while you were in the hospital after you are discharged, you can call the unit and asked to speak with the hospitalist on call if the hospitalist that took care of you is not available. Once you are discharged, your primary care physician will handle any further medical issues. Please note that NO REFILLS for any discharge medications will be authorized once you are discharged, as it is imperative that you return to your primary care physician (or establish a relationship with a primary care physician if you do not have one) for your aftercare needs so that they can reassess your need for medications and monitor your lab values.    Today   CHIEF COMPLAINT:   Chief Complaint  Patient presents with  . Shortness of Breath     HISTORY OF PRESENT ILLNESS:  Craig [name]  is a 65 y.o. male with a known history of COPD, lymphoma, lung cancer, hypertension, diabetes underwent chemotherapy earlier today for lymphoma at the cancer center and started having acutely worsening shortness of breath. Patient has noticed some cough and has some mild chronic shortness of breath which has worsened today. Clear sputum. No chest pain. Tachycardic. Patient was given steroids, Benadryl and 1 round of DuoNeb with no improvement. Presently he is acutely short of breath barely able to speak 1 or 2 words.  He mentions he does have some baseline tachycardia of febrile 100. Today his heart rate is 130s to 140s.   VITAL SIGNS:  Blood pressure 124/65, pulse (!) 103, temperature 97.7 F (36.5 C), resp. rate (!) 34, height '5\' 8"'$  (1.727 m), weight 67.5 kg (148 lb 13 oz), SpO2 100 %.  I/O:   Intake/Output Summary (Last 24 hours) at 07/03/16 1718 Last data filed at 07/03/16 1000  Gross per 24 hour  Intake              710 ml  Output             1000 ml  Net             -290 ml    PHYSICAL EXAMINATION:  GENERAL:  65 y.o.-year-old patient lying in the bed with no acute distress.  EYES: Pupils equal, round, reactive to light and accommodation. No scleral icterus. Extraocular muscles intact.  HEENT: Head atraumatic, normocephalic. Oropharynx and nasopharynx clear.  NECK:  Supple, no jugular venous distention. No thyroid enlargement, no tenderness.  LUNGS: Normal breath sounds bilaterally, no wheezing, rales,rhonchi or crepitation. No use of accessory muscles of respiration.  CARDIOVASCULAR: S1, S2 normal. No murmurs, rubs, or gallops.  ABDOMEN: Soft, non-tender, non-distended. Bowel sounds present. No organomegaly or mass.  EXTREMITIES: No pedal edema, cyanosis, or clubbing.  NEUROLOGIC: Cranial nerves II through XII are intact. Muscle strength 5/5 in all extremities. Sensation intact. Gait not checked.  PSYCHIATRIC: The patient is alert  and oriented x 3.  SKIN: No obvious rash, lesion, or ulcer.   DATA REVIEW:   CBC  Recent Labs Lab 07/03/16 0420  WBC 6.7  HGB 8.4*  HCT 25.7*  PLT 145*    Chemistries   Recent Labs Lab 07/02/16 1146 07/03/16 0420  NA 137 135  K 4.9 5.5*  CL 95* 97*  CO2 34* 33*  GLUCOSE 159* 161*  BUN 20 36*  CREATININE 1.17 1.24  CALCIUM 8.9 8.5*  AST 23  --   ALT 12*  --   ALKPHOS 75  --   BILITOT 0.5  --     Cardiac Enzymes  Recent Labs Lab 07/02/16 1146  TROPONINI <0.03    Microbiology Results  Results for orders placed or performed during the hospital encounter of 07/02/16  MRSA PCR Screening     Status: None   Collection Time: 07/02/16  4:50 PM  Result Value Ref Range Status   MRSA by PCR NEGATIVE NEGATIVE Final    Comment:        The GeneXpert MRSA Assay (FDA approved for NASAL specimens only), is one component of a comprehensive MRSA colonization surveillance program. It is not intended to diagnose MRSA infection nor to guide or monitor treatment for MRSA infections.     RADIOLOGY:  Dg Chest Portable 1 View  Result Date: 07/02/2016 CLINICAL DATA:  Shortness of breath, tachycardia EXAM: PORTABLE CHEST 1 VIEW COMPARISON:  05/31/2016 FINDINGS: The lungs are hyperinflated likely secondary to COPD. There is mild right apical fibrosis. There is no focal parenchymal opacity. There is no pleural effusion or pneumothorax. The heart and mediastinal contours are unremarkable. Left-sided Port-A-Cath in satisfactory position. The osseous structures are unremarkable. IMPRESSION: No active disease. Electronically Signed   By: Kathreen Devoid   On: 07/02/2016 12:41    EKG:   Orders placed or performed in visit on 07/02/16  . EKG 12-Lead      Management plans discussed with the patient, family and they are in agreement.  CODE STATUS:  Code Status History    Date Active Date Inactive Code Status Order ID Comments User Context   07/02/2016  3:01 PM 07/03/2016  3:55 PM  DNR [id number]  Hillary Bow, MD ED   05/31/2016  6:48 PM 06/02/2016  3:41 PM DNR [id number]  Nicholes Mango, MD Inpatient   11/19/2015  7:53 PM 11/20/2015  2:12 PM Full Code [id number]  Idelle Crouch, MD Inpatient    Questions for Most Recent Historical Code Status (Order [id number])    Question Answer  Comment   In the event of cardiac or respiratory ARREST Do not call a "code blue"    In the event of cardiac or respiratory ARREST Do not perform Intubation, CPR, defibrillation or ACLS    In the event of cardiac or respiratory ARREST Use medication by any route, position, wound care, and other measures to relive pain and suffering. May use oxygen, suction and manual treatment of airway obstruction as needed for comfort.       TOTAL TIME TAKING CARE OF THIS PATIENT: 35 minutes.    Vaughan Basta M.D on 07/03/2016 at 5:18 PM  Between 7am to 6pm - Pager - [phone]  After 6pm go to [email or web address] - password [email or web address] [facility]  Office  [phone]  CC: Primary care physician; [name], MD   Note: This dictation was prepared with Dragon dictation along with smaller phrase technology. Any transcriptional errors that result from this process are unintentional.

## 2016-07-03 NOTE — Progress Notes (Signed)
62 Dr. Marthann Schiller paged and updated. D/C orders  Given.

## 2016-07-03 NOTE — Patient Outreach (Signed)
Telephone call made to Mr. Mandala on both numbers that are provided in chart. No answer. Left HIPPA compliant voicemail message. Will follow up at another time to discuss potential [place] Management services.   Marthenia Rolling, MSN-Ed, RN,BSN [facility] Liaison [phone]

## 2016-07-03 NOTE — Discharge Instructions (Signed)
Respiratory failure is when your lungs are not working well and your breathing (respiratory) system fails. When respiratory failure occurs, it is difficult for your lungs to get enough oxygen, get rid of carbon dioxide, or both. Respiratory failure can be life threatening.  °Respiratory failure can be acute or chronic. Acute respiratory failure is sudden, severe, and requires emergency medical treatment. Chronic respiratory failure is less severe, happens over time, and requires ongoing treatment.  °WHAT ARE THE CAUSES OF ACUTE RESPIRATORY FAILURE?  °Any problem affecting the heart or lungs can cause acute respiratory failure. Some of these causes include the following: °· Chronic bronchitis and emphysema (COPD).   °· Blood clot going to a lung (pulmonary embolism).   °· Having water in the lungs caused by heart failure, lung injury, or infection (pulmonary edema).   °· Collapsed lung (pneumothorax).   °· Pneumonia.   °· Pulmonary fibrosis.   °· Obesity.   °· Asthma.   °· Heart failure.   °· Any type of trauma to the chest that can make breathing difficult.   °· Nerve or muscle diseases making chest movements difficult. °HOW WILL MY ACUTE RESPIRATORY FAILURE BE TREATED?  °Treatment of acute respiratory failure depends on the cause of the respiratory failure. Usually, you will stay in the intensive care unit so your breathing can be watched closely. Treatment can include the following: °· Oxygen. Oxygen can be delivered through the following: °¨ Nasal cannula. This is small tubing that goes in your nose to give you oxygen. °¨ Face mask. A face mask covers your nose and mouth to give you oxygen. °· Medicine. Different medicines can be given to help with breathing. These can include: °¨ Nebulizers. Nebulizers deliver medicines to open the air passages (bronchodilators). These medicines help to open or relax the airways in the lungs so you can breathe better. They can also help loosen mucus from your  lungs. °¨ Diuretics. Diuretic medicines can help you breathe better by getting rid of extra water in your body. °¨ Steroids. Steroid medicines can help decrease swelling (inflammation) in your lungs. °¨ Antibiotics. °· Chest tube. If you have a collapsed lung (pneumothorax), a chest tube is placed to help reinflate the lung. °· Noninvasive positive pressure ventilation (NPPV). This is a tight-fitting mask that goes over your nose and mouth. The mask has tubing that is attached to a machine. The machine blows air into the tubing, which helps to keep the tiny air sacs (alveoli) in your lungs open. This machine allows you to breathe on your own. °· Ventilator. A ventilator is a breathing machine. When on a ventilator, a breathing tube is put into the lungs. A ventilator is used when you can no longer breathe well enough on your own. You may have low oxygen levels or high carbon dioxide (CO2) levels in your blood. When you are on a ventilator, sedation and pain medicines are given to make you sleep so your lungs can heal. °SEEK IMMEDIATE MEDICAL CARE IF: °· You have shortness of breath (dyspnea) with or without activity. °· You have rapid breathing (tachypnea). °· You are wheezing. °· You are unable to say more than a few words without having to catch your breath. °· You find it very difficult to function normally. °· You have a fast heart rate. °· You have a bluish color to your finger or toe nail beds. °· You have confusion or drowsiness or both. °  °This information is not intended to replace advice given to you by your health care provider. Make sure you discuss   any questions you have with your health care provider. °  °Document Released: 10/06/2013 Document Revised: 06/22/2015 Document Reviewed: 10/06/2013 °Elsevier Interactive Patient Education ©2016 Elsevier Inc. ° °

## 2016-07-03 NOTE — Progress Notes (Signed)
Inpatient Diabetes Program Recommendations  AACE/ADA: New Consensus Statement on Inpatient Glycemic Control (2015)  Target Ranges:  Prepandial:   less than 140 mg/dL      Peak postprandial:   less than 180 mg/dL (1-2 hours)      Critically ill patients:  140 - 180 mg/dL   Lab Results  Component Value Date   GLUCAP 204 (H) 07/03/2016   HGBA1C 6.9 (H) 06/01/2016    Review of Glycemic Control  Results for Craig [name], Craig [name] (MRN [id number]) as of 07/03/2016 08:25  Ref. Range 07/02/2016 21:24 07/03/2016 08:24  Glucose-Capillary Latest Ref Range: 65 - 99 mg/dL 311 (H) 204 (H)    Diabetes history: Type 2 Outpatient Diabetes medications: Actos '30mg'$  qday, Glucotrol '5mg'$  qday Current orders for Inpatient glycemic control: Actos '30mg'$  qday, Glucotrol '5mg'$  qday, Novolog 0-9 units tid, Novolog 0-5 units qhs  * IV steroids q12h.   Inpatient Diabetes Program Recommendations:  Consider stopping oral diabetes medications while inpatient and start low dose basal insulin, Lantus 7 units qhs; increase Novolog correction to moderate scale (0-15 units) tid while he's on steroids.  Decrease insulin as steroids are tapered.   Gentry Fitz, RN, BA, MHA, CDE Diabetes Coordinator Inpatient Diabetes Program  [phone] (Team Pager) [phone] ([place]) 07/03/2016 8:32 AM

## 2016-07-03 NOTE — Progress Notes (Signed)
Pt continued to refuse bipap during the night.

## 2016-07-03 NOTE — Consult Note (Signed)
   [facility] CM Inpatient Consult   07/03/2016  Craig [name]. [birth year] [phone]   [facility] Care Management referral received. Called on nursing unit. Mr. Keesling has already been discharged. Will attempt to reach him  to discuss [place] Management services.   Marthenia Rolling, MSN-Ed, RN,BSN [facility] Liaison [phone]

## 2016-07-04 LAB — GLUCOSE, CAPILLARY: Glucose-Capillary: 188 mg/dL — ABNORMAL HIGH (ref 65–99)

## 2016-07-05 ENCOUNTER — Encounter: Payer: Self-pay | Admitting: *Deleted

## 2016-07-05 ENCOUNTER — Other Ambulatory Visit: Payer: Self-pay | Admitting: *Deleted

## 2016-07-05 DIAGNOSIS — J441 Chronic obstructive pulmonary disease with (acute) exacerbation: Secondary | ICD-10-CM | POA: Diagnosis not present

## 2016-07-05 DIAGNOSIS — K219 Gastro-esophageal reflux disease without esophagitis: Secondary | ICD-10-CM | POA: Diagnosis not present

## 2016-07-05 DIAGNOSIS — J9611 Chronic respiratory failure with hypoxia: Secondary | ICD-10-CM | POA: Diagnosis not present

## 2016-07-05 DIAGNOSIS — E1122 Type 2 diabetes mellitus with diabetic chronic kidney disease: Secondary | ICD-10-CM | POA: Diagnosis not present

## 2016-07-05 DIAGNOSIS — I129 Hypertensive chronic kidney disease with stage 1 through stage 4 chronic kidney disease, or unspecified chronic kidney disease: Secondary | ICD-10-CM | POA: Diagnosis not present

## 2016-07-05 DIAGNOSIS — H409 Unspecified glaucoma: Secondary | ICD-10-CM | POA: Diagnosis not present

## 2016-07-05 DIAGNOSIS — N189 Chronic kidney disease, unspecified: Secondary | ICD-10-CM | POA: Diagnosis not present

## 2016-07-05 DIAGNOSIS — Z7984 Long term (current) use of oral hypoglycemic drugs: Secondary | ICD-10-CM | POA: Diagnosis not present

## 2016-07-05 DIAGNOSIS — Z85118 Personal history of other malignant neoplasm of bronchus and lung: Secondary | ICD-10-CM | POA: Diagnosis not present

## 2016-07-05 NOTE — Patient Outreach (Signed)
Transition of care call successful, follow up on recent hospitalization 9/12-9/19 COPD exacerbation, Hx of lung cancer and lymphoma, diabetes.  Spoke with pt, HIPAA verified.  Pt reports has some sob, respiratory medications helping, using rescue inhaler 1-2 times a day.  Pt reports having a little mucous, clear.  Pt reports sugars staying under 150.   Pt reports needs to call Primary Care MD to schedule follow up appointment.  RN CM discussed with pt [facility] transition of care program- follow for 31 days (weekly phone calls, a home visit) to which pt agreed.    Patient was recently discharged from hospital and all medications have been reviewed.  Plan: As discussed with pt, plan to follow up again telephonically next week as part of ongoing transition of care.             Barrier letter faxed to Dr. Lovie Macadamia- informing of [facility]   involvement.    [name].   [place]  [phone]

## 2016-07-06 DIAGNOSIS — N189 Chronic kidney disease, unspecified: Secondary | ICD-10-CM | POA: Diagnosis not present

## 2016-07-06 DIAGNOSIS — H409 Unspecified glaucoma: Secondary | ICD-10-CM | POA: Diagnosis not present

## 2016-07-06 DIAGNOSIS — Z7984 Long term (current) use of oral hypoglycemic drugs: Secondary | ICD-10-CM | POA: Diagnosis not present

## 2016-07-06 DIAGNOSIS — Z85118 Personal history of other malignant neoplasm of bronchus and lung: Secondary | ICD-10-CM | POA: Diagnosis not present

## 2016-07-06 DIAGNOSIS — E1122 Type 2 diabetes mellitus with diabetic chronic kidney disease: Secondary | ICD-10-CM | POA: Diagnosis not present

## 2016-07-06 DIAGNOSIS — J9611 Chronic respiratory failure with hypoxia: Secondary | ICD-10-CM | POA: Diagnosis not present

## 2016-07-06 DIAGNOSIS — J441 Chronic obstructive pulmonary disease with (acute) exacerbation: Secondary | ICD-10-CM | POA: Diagnosis not present

## 2016-07-06 DIAGNOSIS — I129 Hypertensive chronic kidney disease with stage 1 through stage 4 chronic kidney disease, or unspecified chronic kidney disease: Secondary | ICD-10-CM | POA: Diagnosis not present

## 2016-07-06 DIAGNOSIS — K219 Gastro-esophageal reflux disease without esophagitis: Secondary | ICD-10-CM | POA: Diagnosis not present

## 2016-07-09 ENCOUNTER — Inpatient Hospital Stay (HOSPITAL_BASED_OUTPATIENT_CLINIC_OR_DEPARTMENT_OTHER): Payer: Commercial Managed Care - HMO | Admitting: Internal Medicine

## 2016-07-09 ENCOUNTER — Inpatient Hospital Stay: Payer: Commercial Managed Care - HMO

## 2016-07-09 ENCOUNTER — Encounter: Payer: Self-pay | Admitting: Internal Medicine

## 2016-07-09 VITALS — BP 126/82 | HR 121 | Temp 97.2°F | Resp 23 | Ht 68.0 in | Wt 145.0 lb

## 2016-07-09 DIAGNOSIS — J45909 Unspecified asthma, uncomplicated: Secondary | ICD-10-CM

## 2016-07-09 DIAGNOSIS — Z7951 Long term (current) use of inhaled steroids: Secondary | ICD-10-CM | POA: Diagnosis not present

## 2016-07-09 DIAGNOSIS — J449 Chronic obstructive pulmonary disease, unspecified: Secondary | ICD-10-CM | POA: Diagnosis not present

## 2016-07-09 DIAGNOSIS — R591 Generalized enlarged lymph nodes: Secondary | ICD-10-CM

## 2016-07-09 DIAGNOSIS — K219 Gastro-esophageal reflux disease without esophagitis: Secondary | ICD-10-CM

## 2016-07-09 DIAGNOSIS — Z87891 Personal history of nicotine dependence: Secondary | ICD-10-CM | POA: Diagnosis not present

## 2016-07-09 DIAGNOSIS — I10 Essential (primary) hypertension: Secondary | ICD-10-CM | POA: Diagnosis not present

## 2016-07-09 DIAGNOSIS — Z23 Encounter for immunization: Secondary | ICD-10-CM | POA: Diagnosis not present

## 2016-07-09 DIAGNOSIS — C3410 Malignant neoplasm of upper lobe, unspecified bronchus or lung: Secondary | ICD-10-CM

## 2016-07-09 DIAGNOSIS — Z8042 Family history of malignant neoplasm of prostate: Secondary | ICD-10-CM

## 2016-07-09 DIAGNOSIS — G4700 Insomnia, unspecified: Secondary | ICD-10-CM | POA: Diagnosis not present

## 2016-07-09 DIAGNOSIS — Z85038 Personal history of other malignant neoplasm of large intestine: Secondary | ICD-10-CM

## 2016-07-09 DIAGNOSIS — Z9981 Dependence on supplemental oxygen: Secondary | ICD-10-CM | POA: Diagnosis not present

## 2016-07-09 DIAGNOSIS — Z9221 Personal history of antineoplastic chemotherapy: Secondary | ICD-10-CM | POA: Diagnosis not present

## 2016-07-09 DIAGNOSIS — C8300 Small cell B-cell lymphoma, unspecified site: Secondary | ICD-10-CM | POA: Diagnosis not present

## 2016-07-09 DIAGNOSIS — E119 Type 2 diabetes mellitus without complications: Secondary | ICD-10-CM | POA: Diagnosis not present

## 2016-07-09 DIAGNOSIS — C649 Malignant neoplasm of unspecified kidney, except renal pelvis: Secondary | ICD-10-CM | POA: Diagnosis not present

## 2016-07-09 DIAGNOSIS — C3411 Malignant neoplasm of upper lobe, right bronchus or lung: Secondary | ICD-10-CM | POA: Diagnosis not present

## 2016-07-09 DIAGNOSIS — Z8673 Personal history of transient ischemic attack (TIA), and cerebral infarction without residual deficits: Secondary | ICD-10-CM | POA: Diagnosis not present

## 2016-07-09 DIAGNOSIS — Z79899 Other long term (current) drug therapy: Secondary | ICD-10-CM

## 2016-07-09 DIAGNOSIS — R000 Tachycardia, unspecified: Secondary | ICD-10-CM

## 2016-07-09 DIAGNOSIS — Z5112 Encounter for antineoplastic immunotherapy: Secondary | ICD-10-CM | POA: Diagnosis not present

## 2016-07-09 LAB — CBC WITH DIFFERENTIAL/PLATELET
Basophils Absolute: 0.1 10*3/uL (ref 0–0.1)
Basophils Relative: 1 %
Eosinophils Absolute: 0.1 10*3/uL (ref 0–0.7)
Eosinophils Relative: 2 %
HEMATOCRIT: 28.6 % — AB (ref 40.0–52.0)
HEMOGLOBIN: 9.4 g/dL — AB (ref 13.0–18.0)
LYMPHS ABS: 0.6 10*3/uL — AB (ref 1.0–3.6)
Lymphocytes Relative: 10 %
MCH: 26.9 pg (ref 26.0–34.0)
MCHC: 32.7 g/dL (ref 32.0–36.0)
MCV: 82.3 fL (ref 80.0–100.0)
MONOS PCT: 5 %
Monocytes Absolute: 0.3 10*3/uL (ref 0.2–1.0)
NEUTROS ABS: 4.9 10*3/uL (ref 1.4–6.5)
NEUTROS PCT: 82 %
Platelets: 250 10*3/uL (ref 150–440)
RBC: 3.48 MIL/uL — ABNORMAL LOW (ref 4.40–5.90)
RDW: 16.5 % — AB (ref 11.5–14.5)
WBC: 6 10*3/uL (ref 3.8–10.6)

## 2016-07-09 LAB — BASIC METABOLIC PANEL
ANION GAP: 6 (ref 5–15)
BUN: 22 mg/dL — ABNORMAL HIGH (ref 6–20)
CALCIUM: 8.9 mg/dL (ref 8.9–10.3)
CHLORIDE: 93 mmol/L — AB (ref 101–111)
CO2: 37 mmol/L — AB (ref 22–32)
CREATININE: 1.23 mg/dL (ref 0.61–1.24)
GFR calc non Af Amer: 60 mL/min — ABNORMAL LOW (ref >60)
Glucose, Bld: 279 mg/dL — ABNORMAL HIGH (ref 65–99)
Potassium: 4.6 mmol/L (ref 3.5–5.1)
SODIUM: 136 mmol/L (ref 135–145)

## 2016-07-09 MED ORDER — SODIUM CHLORIDE 0.9% FLUSH
10.0000 mL | Freq: Once | INTRAVENOUS | Status: AC
  Administered 2016-07-09: 10 mL via INTRAVENOUS
  Filled 2016-07-09: qty 10

## 2016-07-09 MED ORDER — INFLUENZA VAC SPLIT QUAD 0.5 ML IM SUSY
0.5000 mL | PREFILLED_SYRINGE | Freq: Once | INTRAMUSCULAR | Status: AC
  Administered 2016-07-09: 0.5 mL via INTRAMUSCULAR
  Filled 2016-07-09: qty 0.5

## 2016-07-09 MED ORDER — HEPARIN SOD (PORK) LOCK FLUSH 100 UNIT/ML IV SOLN
500.0000 [IU] | Freq: Once | INTRAVENOUS | Status: AC
  Administered 2016-07-09: 500 [IU] via INTRAVENOUS
  Filled 2016-07-09: qty 5

## 2016-07-09 NOTE — Assessment & Plan Note (Addendum)
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 Hemoglobin approximately 8. Reaction to [facility]- respiratory distress.  # Recommend Ibrutinib as next line of therapy; plan EKG today. Discussed the potential of A. fib the patient. Prescription is initiated.   # Flu shot is okay.   # Lung ca- history of local recurrence; currently on surveillance most recent imaging negative for any progression.  #  Kidney cancer status post cryoablation most recent imaging August 2017-no clinical evidence of progression.  # follow up with me in 4 weeks/labs.

## 2016-07-09 NOTE — Progress Notes (Signed)
[facility] OFFICE PROGRESS NOTE  Patient Care Team: [name], MD as PCP - General (Family Medicine) Lyman Speller, RN as [place] Management  Lung cancer, upper lobe [facility])   Staging form: Lung, AJCC 7th Edition   - Clinical stage from 06/15/2010: yT2, N2, M0 - Signed by [name], NP on 02/03/2015   - Pathologic stage from 04/04/2014: yT2, N2, M1 - Signed by [name], NP on 02/03/2015  Lymphoma, small lymphocytic ([place])   Staging form: Lymphoid Neoplasms, AJCC 6th Edition   - Clinical stage from 10/15/2013: Stage II - Signed by [name], NP on 02/03/2015  Renal cell cancer [facility])   Staging form: Kidney, AJCC 7th Edition   - Clinical stage from 02/03/2015: Stage I (yT1a, N0, M0) - Signed by [name], NP on 02/03/2015   Oncology History   1. Right upper lobe lung mass biopsies positive for adenocarcinoma; PET- T2, N2, M0;  stage IIIA;  carboplatinum Alimta and Avastin;  [april  9th of 2013]  Patient had an allergic reaction to carboplatinum with acute bronchospasm in May of 2013; carbo-discontinued.; Maintenance Alimta and Avastin  from June of 2013 hold chemotherapy because of increasing shortness of breath (in July, 2013); VARISTRAT-GOOD; Started on [place] February 3 about 2014; Diarrhea 4 days after Tarceva was started.  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   # SEP 2017- GAZYVA x1 [stopped sec to respi issues]  # RIGHT KIDNEY CA- RCC [s/p Bx]- s/p Cryo [Dr.Yamagat; IR; 2016]   #  Poor pulmonary function; 3 L home O2.     Lung cancer, upper lobe  ([place])   02/03/2015 Initial Diagnosis    Lung cancer, upper lobe       Lymphoma, small lymphocytic ([place])   02/03/2015 Initial Diagnosis    Lymphoma, small lymphocytic ([place])      Cancer of lung ([place])   12/21/2015 Initial Diagnosis    Cancer of lung ([place])      Primary cancer of right upper lobe of lung ([place])   06/01/2016 Initial Diagnosis    Primary cancer of right upper lobe of lung ([place])       INTERVAL HISTORY:  [name]. 65 y.o.  male [place] patientHistory of chronic respiratory failure on home O2 with above history ofMultiple malignancies- lung with recurrence- treatment currently on hold; also kidney cancer status post cryoablation and also SLL/CLL- Status post 1 dose of Dyann Kief is here for follow-up  Patient did not tolerate Gazyva well; he developed shortness of breath shortly into infusion- and needed admission the hospital/COPD exacerbation.  Patient complains of intermittent pain in his abdomen and also the chest wall. He has been taking oxycodone. He denies any weight loss. Denies any night sweats. His chronic shortness of breath on 3 L of oxygen; Currently breathing is at baseline.  REVIEW OF SYSTEMS:  A complete 10 point review of system is done which is negative except mentioned above/history of present illness.   PAST MEDICAL HISTORY :  Past Medical History:  Diagnosis Date  .  Asthma   . Cancer ([place])   . Colon cancer ([place])   . COPD (chronic obstructive pulmonary disease) ([place])   . Diabetes mellitus without complication ([place])   . Difficulty sleeping   . Dysrhythmia   . GERD (gastroesophageal reflux disease)   . Glaucoma   . History of bronchitis   . Hypertension   . Lung cancer ([place])    right upper lobe mass positive for adenocarcinoma  . Lymphoma ([facility])    low grade  . Renal cancer ([place])   . Respiratory failure ([place]) 07/12/2010   acute  . Right renal mass   . Shortness of breath dyspnea   . Stroke ([place]) 1990'S   NO RESIDUAL PROBLEMS  .  Supplemental oxygen dependent    USES 3 LITERS CONTINUOUSLY    PAST SURGICAL HISTORY :  History reviewed. No pertinent surgical history.  FAMILY HISTORY :   Family History  Problem Relation Age of Onset  . Diabetes Mellitus II Mother   . Heart disease Father   . Hypertension Father   . Diabetes Mellitus II Sister   . Prostate cancer Brother     SOCIAL HISTORY:   Social History  Substance Use Topics  . Smoking status: Former Smoker    Packs/day: 1.00    Years: 30.00    Types: Cigarettes    Start date: 12/28/1968    Quit date: 01/13/2010  . Smokeless tobacco: Never Used  . Alcohol use No     Comment: Stopped 2001    ALLERGIES:  is allergic to iodinated diagnostic agents and nexium [esomeprazole magnesium].  MEDICATIONS:  Current Outpatient Prescriptions  Medication Sig Dispense Refill  . albuterol (PROVENTIL HFA;VENTOLIN HFA) 108 (90 BASE) MCG/ACT inhaler Inhale 2 puffs into the lungs every 6 (six) hours as needed for wheezing or shortness of breath.     Marland Kitchen albuterol (PROVENTIL) (2.5 MG/3ML) 0.083% nebulizer solution Take 2.5 mg by nebulization every 4 (four) hours.    . budesonide-formoterol (SYMBICORT) 160-4.5 MCG/ACT inhaler Inhale 2 puffs into the lungs 2 (two) times daily.     . diphenhydrAMINE (BENADRYL) 25 MG tablet Take 2 tablets (50 mg total) by mouth as directed. (Patient taking differently: Take 25 mg by mouth as needed for itching or allergies. ) 2 tablet 0  . glipiZIDE (GLUCOTROL) 5 MG tablet Take by mouth daily.    Marland Kitchen HYDROcodone-acetaminophen (NORCO/VICODIN) 5-325 MG tablet Take 1 tablet by mouth every 6 (six) hours as needed for moderate pain. Please Note new directions 90 tablet 0  . loperamide (IMODIUM A-D) 2 MG tablet Take 2 mg by mouth 4 (four) times daily as needed for diarrhea or loose stools. Reported on 12/21/2015    . losartan-hydrochlorothiazide (HYZAAR) 50-12.5 MG per tablet Take 1 tablet by mouth daily.    . pioglitazone (ACTOS) 30 MG tablet Take 30 mg by  mouth daily.    . temazepam (RESTORIL) 7.5 MG capsule Take 7.5-15 mg by mouth at bedtime as needed for sleep. Reported on 12/21/2015  2  . theophylline (UNIPHYL) 400 MG 24 hr tablet Take 400 mg by mouth daily as needed (For breathing.).     Marland Kitchen tiotropium (SPIRIVA) 18 MCG inhalation capsule Place 18 mcg into inhaler and inhale daily.     Marland Kitchen dextromethorphan (DELSYM) 30 MG/5ML liquid Take 5 mLs (30 mg total) by mouth 2 (two) times daily. (Patient not taking: Reported on 07/09/2016) 89 mL 0  . predniSONE (DELTASONE) 10 MG tablet TAKE 1 TABLET (10 MG TOTAL) BY MOUTH  DAILY WITH BREAKFAST. (Patient not taking: Reported on 07/05/2016) 30 tablet 3   No current facility-administered medications for this visit.    Facility-Administered Medications Ordered in Other Visits  Medication Dose Route Frequency Provider Last Rate Last Dose  . sodium chloride 0.9 % injection 10 mL  10 mL Intracatheter PRN Forest Gleason, MD   10 mL at 04/28/15 1410  . sodium chloride 0.9 % injection 10 mL  10 mL Intravenous PRN Forest Gleason, MD   10 mL at 05/12/15 1350    PHYSICAL EXAMINATION: ECOG PERFORMANCE STATUS: 2 - Symptomatic, <50% confined to bed  BP 126/82 (BP Location: Left Arm, Patient Position: Sitting)   Pulse (!) 121   Temp 97.2 F (36.2 C) (Tympanic)   Resp (!) 23   Ht '5\' 8"'$  (1.727 m)   Wt 145 lb (65.8 kg)   SpO2 95%   BMI 22.05 kg/m   Filed Weights   07/09/16 0922  Weight: 145 lb (65.8 kg)    GENERAL: Well-nourished well-developed; Alert, no distress and comfortable.   Alone. He is on home oxygen. He is in a wheelchair.  EYES: no pallor or icterus OROPHARYNX: no thrush or ulceration; good dentition  NECK: supple, no masses felt LYMPH:  no palpable lymphadenopathy in the cervical, axillary or inguinal regions LUNGS:Bilateral decreased air entry to auscultationn and  No wheeze or crackles HEART/CVS: regular rate & rhythm and no murmurs; No lower extremity edema ABDOMEN:abdomen soft, non-tender and normal  bowel sounds Musculoskeletal:no cyanosis of digits and no clubbing  PSYCH: alert & oriented x 3 with fluent speech NEURO: no focal motor/sensory deficits SKIN:  no rashes or significant lesions  LABORATORY DATA:  I have reviewed the data as listed    Component Value Date/Time   NA 136 07/09/2016 0904   NA 139 01/17/2015 0913   K 4.6 07/09/2016 0904   K 3.8 01/17/2015 0913   CL 93 (L) 07/09/2016 0904   CL 99 (L) 01/17/2015 0913   CO2 37 (H) 07/09/2016 0904   CO2 35 (H) 01/17/2015 0913   GLUCOSE 279 (H) 07/09/2016 0904   GLUCOSE 169 (H) 01/17/2015 0913   BUN 22 (H) 07/09/2016 0904   BUN 15 01/17/2015 0913   CREATININE 1.23 07/09/2016 0904   CREATININE 1.23 01/17/2015 0913   CALCIUM 8.9 07/09/2016 0904   CALCIUM 8.8 (L) 01/17/2015 0913   PROT 7.2 07/02/2016 1146   PROT 6.6 01/17/2015 0913   ALBUMIN 3.7 07/02/2016 1146   ALBUMIN 3.7 01/17/2015 0913   AST 23 07/02/2016 1146   AST 20 01/17/2015 0913   ALT 12 (L) 07/02/2016 1146   ALT 15 (L) 01/17/2015 0913   ALKPHOS 75 07/02/2016 1146   ALKPHOS 76 01/17/2015 0913   BILITOT 0.5 07/02/2016 1146   BILITOT 0.5 01/17/2015 0913   GFRNONAA 60 (L) 07/09/2016 0904   GFRNONAA >60 01/17/2015 0913   GFRAA >60 07/09/2016 0904   GFRAA >60 01/17/2015 0913    No results found for: SPEP, UPEP  Lab Results  Component Value Date   WBC 6.0 07/09/2016   NEUTROABS 4.9 07/09/2016   HGB 9.4 (L) 07/09/2016   HCT 28.6 (L) 07/09/2016   MCV 82.3 07/09/2016   PLT 250 07/09/2016      Chemistry      Component Value Date/Time   NA 136 07/09/2016 0904   NA 139 01/17/2015 0913   K 4.6 07/09/2016 0904   K 3.8 01/17/2015 0913   CL 93 (L) 07/09/2016 0904   CL  99 (L) 01/17/2015 0913   CO2 37 (H) 07/09/2016 0904   CO2 35 (H) 01/17/2015 0913   BUN 22 (H) 07/09/2016 0904   BUN 15 01/17/2015 0913   CREATININE 1.23 07/09/2016 0904   CREATININE 1.23 01/17/2015 0913      Component Value Date/Time   CALCIUM 8.9 07/09/2016 0904   CALCIUM 8.8 (L)  01/17/2015 0913   ALKPHOS 75 07/02/2016 1146   ALKPHOS 76 01/17/2015 0913   AST 23 07/02/2016 1146   AST 20 01/17/2015 0913   ALT 12 (L) 07/02/2016 1146   ALT 15 (L) 01/17/2015 0913   BILITOT 0.5 07/02/2016 1146   BILITOT 0.5 01/17/2015 0913       RADIOGRAPHIC STUDIES: I have personally reviewed the radiological images as listed and agreed with the findings in the report. No results found.   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 Hemoglobin approximately 8. Reaction to [facility]- respiratory distress.  # Recommend Ibrutinib as next line of therapy; plan EKG today. Discussed the potential of A. fib the patient  # Flu shot is okay.   # Lung ca- history of local recurrence; currently on surveillance most recent imaging negative for any progression.  #  Kidney cancer status post cryoablation most recent imaging August 2017-no clinical evidence of progression.  # follow up with me in 4 weeks/labs.    Orders Placed This Encounter  Procedures  . CBC with Differential    Standing Status:   Future    Standing Expiration Date:   07/09/2017  . Comprehensive metabolic panel    Standing Status:   Future    Standing Expiration Date:   07/09/2017  . Lactate dehydrogenase    Standing Status:   Future    Standing Expiration Date:   07/09/2017  . EKG 12-Lead    Standing Status:   Future    Standing Expiration Date:   07/09/2017    Scheduling Instructions:     Tachycardia- HR 120    Order Specific Question:   Where should this test be performed    Answer:   [facility]   All questions were answered. The patient knows to call the clinic with any problems, questions or concerns.      [name], MD 07/10/2016 8:04 AM

## 2016-07-09 NOTE — Progress Notes (Signed)
Pt's has elevated heart reports always is.  On 3L O2 via [place]

## 2016-07-10 ENCOUNTER — Ambulatory Visit
Admission: RE | Admit: 2016-07-10 | Discharge: 2016-07-10 | Disposition: A | Discharge status: Home / Self Care | Payer: Commercial Managed Care - HMO | Source: Ambulatory Visit | Attending: Family Medicine | Admitting: Family Medicine

## 2016-07-10 DIAGNOSIS — R000 Tachycardia, unspecified: Secondary | ICD-10-CM | POA: Insufficient documentation

## 2016-07-10 MED ORDER — IBRUTINIB 140 MG PO CAPS: 420.0000 mg | ORAL_CAPSULE | Freq: Every day | ORAL | 3 refills | Status: DC

## 2016-07-10 NOTE — Progress Notes (Signed)
DISCONTINUE ON PATHWAY REGIMEN - Lymphoma and CLL  [id number]: Obinutuzumab + Chlorambucil q28 Days x 6 Cycles   A cycle is every 28 days:     Obinutuzumab [facility])) 100 mg flat dose in 100 mL NS IV over 4 hours on day 1 cycle 1 only. Dose Mod: None     Obinutuzumab (Gazyva(R)) 900 mg flat dose in 250 mL NS IV titrated to a max rate of 400 mg/hr on day 2 cycle 1 only. Initial rate of 50 mg/hr may be increased by 50 mg/hr every 30 minutes if tolerated. Dose Mod: None     Obinutuzumab (Gazyva(R)) 1000 mg flat dose in 250 mL NS IV titrated to a max rate of 400 mg/hr on day 8 and day 15 of cycle 1 only.  Initial rate of 100 mg/hr may be increased by 100 mg/hr every 30 minutes if tolerated. Dose Mod: None     Obinutuzumab (Gazyva(R)) 1000 mg flat dose in 250 mL NS IV titrated to a max rate of 400 mg/hr on day 1 only of cycles 2 through 6. Initial rate of 100 mg/hr may be increased by 100 mg/hr every 30 min if tolerated. Dose Mod: None     Chlorambucil (Leukeran(R)) 0.5 mg/kg Orally on days 1 and 15 of each cycle.  Round to the nearest 2 mg (tablet size). Dose Mod: None Additional Orders: Prescribing info recommends antimicrobial prophylaxis in neutropenic patients and consideration of antiviral/antifungal.  **Always confirm dose/schedule in your pharmacy ordering system**    REASON: Toxicities / Adverse Event PRIOR TREATMENT: [id number]: Obinutuzumab + Chlorambucil q28 Days x 6 Cycles TREATMENT RESPONSE: Unable to Evaluate  START ON PATHWAY REGIMEN - Lymphoma and CLL  [id number]: Ibrutinib 420 mg Daily Until Progression or Unacceptable Toxicity   Administer once daily:     Ibrutinib (Imbruvica(R)) 420 mg flat dose Give three '140mg'$  capsules orally once daily. Do not open, break, or chew the capsules.  Monthly CBC recommended. Dose Mod: None  **Always confirm dose/schedule in your pharmacy ordering system**    Patient Characteristics: Small Lymphocytic Leukemia (SLL), Second Line, 17p del (-),  No Prior Ibrutinib Disease Type: Small Lymphocytic Leukemia (SLL) Disease Type: Not Applicable [place] Stage: IIIA Line of therapy: Second Line 17p Deletion Status: Negative Prior Treatment: No Prior Ibrutinib  Intent of Therapy: Non-Curative / Palliative Intent, Discussed with Patient

## 2016-07-12 ENCOUNTER — Other Ambulatory Visit: Payer: Self-pay | Admitting: *Deleted

## 2016-07-12 NOTE — Patient Outreach (Signed)
Transition of care call #2 successful, ongoing follow up on recent hospitalization 9/12-9/19 COPD exacerbation. Hx of Lung cancer and lymphoma, DM.   Spoke with Craig [name], HIPAA verified,reports on status- going fair.  Craig [name] reports had a couple follow ups with Oncologist, MD working on coming up with medication - less side effects as reason for last hospitalization was reaction to chemo medication.  Craig [name] reports being congested, recently got flu shot, could be coming from that.  Craig [name] reports breathing treatments helping.  Craig [name] reports sugars range 115-120.  Craig [name] reports scheduled to see Primary Care MD next week.    Plan: As discussed with Craig [name], plan to follow up again next week for  initial home visit (part of ongoing transition of care).    [name].   [place] [facility] Management  [phone]

## 2016-07-20 ENCOUNTER — Other Ambulatory Visit: Payer: Self-pay | Admitting: *Deleted

## 2016-07-20 ENCOUNTER — Encounter: Payer: Self-pay | Admitting: *Deleted

## 2016-07-20 NOTE — Patient Outreach (Addendum)
[facility]) Care Management   07/20/2016  Craig Dirr. 27-Jul-1951 [id number]  Craig Falco. is an 65 y.o. male  Subjective:  Pt reports congestion in lungs better, doing nebulizer treatments 1-2 times a day. Pt reports to follow up with  Cancer MD 10/22, MD looking into another chemo drug as last  Treatment had a reaction- respiratory, hospital admission.  Pt reports sugars are great, today  103, range 110-115 am, 150-180 pm.    Objective:   Vitals:   07/20/16 1329  BP: 110/70  Pulse: (!) 105  Resp: 20    ROS  Physical Exam  Constitutional: He is oriented to person, place, and time. He appears well-developed and well-nourished.  Cardiovascular: Regular rhythm.   HR 105   Respiratory: Effort normal.  Congestion noted on expiration anterior bilateral upper lobes.   On continuous 3 liters [place].   GI: Soft.  Musculoskeletal: Normal range of motion.  Neurological: He is alert and oriented to person, place, and time.  Skin: Skin is warm and dry.  Psychiatric: He has a normal mood and affect. His behavior is normal. Judgment and thought content normal.    Encounter Medications:   Outpatient Encounter Prescriptions as of 07/20/2016  Medication Sig Note  . albuterol (PROVENTIL HFA;VENTOLIN HFA) 108 (90 BASE) MCG/ACT inhaler Inhale 2 puffs into the lungs every 6 (six) hours as needed for wheezing or shortness of breath.    Marland Kitchen albuterol (PROVENTIL) (2.5 MG/3ML) 0.083% nebulizer solution Take 2.5 mg by nebulization every 4 (four) hours.   . budesonide-formoterol (SYMBICORT) 160-4.5 MCG/ACT inhaler Inhale 2 puffs into the lungs 2 (two) times daily.    . diphenhydrAMINE (BENADRYL) 25 MG tablet Take 2 tablets (50 mg total) by mouth as directed. (Patient taking differently: Take 25 mg by mouth as needed for itching or allergies. ) 07/20/2016: As needed.   Marland Kitchen glipiZIDE (GLUCOTROL) 5 MG tablet Take by mouth daily.   Marland Kitchen HYDROcodone-acetaminophen (NORCO/VICODIN) 5-325 MG  tablet Take 1 tablet by mouth every 6 (six) hours as needed for moderate pain. Please Note new directions 07/20/2016: As needed.   . loperamide (IMODIUM A-D) 2 MG tablet Take 2 mg by mouth 4 (four) times daily as needed for diarrhea or loose stools. Reported on 12/21/2015 07/20/2016: As needed.   Marland Kitchen losartan-hydrochlorothiazide (HYZAAR) 50-12.5 MG per tablet Take 1 tablet by mouth daily.   . pioglitazone (ACTOS) 30 MG tablet Take 30 mg by mouth daily.   . predniSONE (DELTASONE) 10 MG tablet TAKE 1 TABLET (10 MG TOTAL) BY MOUTH DAILY WITH BREAKFAST. 07/05/2016: Repeat   . temazepam (RESTORIL) 7.5 MG capsule Take 7.5-15 mg by mouth at bedtime as needed for sleep. Reported on 12/21/2015 07/05/2016: As needed.   . tiotropium (SPIRIVA) 18 MCG inhalation capsule Place 18 mcg into inhaler and inhale daily.    Marland Kitchen dextromethorphan (DELSYM) 30 MG/5ML liquid Take 5 mLs (30 mg total) by mouth 2 (two) times daily. (Patient not taking: Reported on 07/20/2016)   . ibrutinib (IMBRUVICA) 140 MG capsul Take 3 capsules (420 mg total) by mouth daily. (Patient not taking: Reported on 07/20/2016)   . theophylline (UNIPHYL) 400 MG 24 hr tablet Take 400 mg by mouth daily as needed (For breathing.).  07/05/2016: Per pt taking daily    Facility-Administered Encounter Medications as of 07/20/2016  Medication  . sodium chloride 0.9 % injection 10 mL  . sodium chloride 0.9 % injection 10 mL    Functional Status:   In  your present state of health, do you have any difficulty performing the following activities: 07/20/2016 07/03/2016  Hearing? N N  Vision? N N  Difficulty concentrating or making decisions? N N  Walking or climbing stairs? N Y  Dressing or bathing? Y N  Doing errands, shopping? Y N  Preparing Food and eating ? Y -  Using the Toilet? N -  Do you have problems with loss of bowel control? N -  Managing your Medications? N -  Managing your Finances? N -  Housekeeping or managing your Housekeeping? Y -  Some recent data  might be hidden    Fall/Depression Screening:    PHQ 2/9 Scores 07/20/2016  PHQ - 2 Score 0    Assessment:  Pleasant 65 year old gentleman, on continuous 3 liters [place].   Congestion noted on expiration  Anterior bilateral upper lobes.   O 2 saturation 94% at rest, HR 105.  No edema, sob.   Per pt- chronic pain In abdomen (left side)- dull,+4 on pain scale.   Plan:  RN CM to continue to follow pt for transition of care, follow up again next week telephonically.              Plan to fax  Dr. Lovie Macadamia  10/6 home visit encounter.    [facility] CM Care Plan Problem One   Flowsheet Row Most Recent Value  Care Plan Problem One  Risk for readmission related to recent hospitalization for COPD exacerbation   Role Documenting the Problem One  Care Management Coordinator  Care Plan for Problem One  Active  [facility] Long Term Goal (31-90 days)  Pt would not readmit into the hospital within the next 31 days   [facility] Long Term Goal Start Date  07/05/16  Interventions for Problem One Long Term Goal  Home visit done, provided COPD zones/reviewed   [facility] CM Short Term Goal #1 (0-30 days)  Pt would continue to take respiratory medications as ordered for the next 30 days   [facility] CM Short Term Goal #1 Start Date  07/05/16  Interventions for Short Term Goal #1  demonstrated to pt with teach back use of Purse lip breathing, also reinforced ongoing use of nebulizer treatments for congestion noted       Latanza Pfefferkorn M.   [place] Care Management  [phone]

## 2016-07-23 ENCOUNTER — Other Ambulatory Visit: Payer: Self-pay | Admitting: *Deleted

## 2016-07-23 DIAGNOSIS — C3492 Malignant neoplasm of unspecified part of left bronchus or lung: Secondary | ICD-10-CM

## 2016-07-23 DIAGNOSIS — C641 Malignant neoplasm of right kidney, except renal pelvis: Secondary | ICD-10-CM

## 2016-07-23 MED ORDER — HYDROCODONE-ACETAMINOPHEN 5-325 MG PO TABS: 1.0000 | ORAL_TABLET | Freq: Four times a day (QID) | ORAL | 0 refills | Status: DC | PRN

## 2016-07-23 NOTE — Telephone Encounter (Signed)
Per Dr Rogue Bussing, see PCP or Pulmonologist Rx changed back to 1 -2  Tabs  Patient informed and said he will call Dr Raul Del

## 2016-07-23 NOTE — Telephone Encounter (Signed)
Requesting increase in pain med, states he is having to take more to control his pain. Also reports that he is having problems breathing and using his breathing machine is not easing it any. He last got #90 Norco on 06/28/16 sig 1 tab every 6 hours changed from 1 -2 tabs every 6 hours. Please advise

## 2016-07-27 ENCOUNTER — Other Ambulatory Visit: Payer: Self-pay | Admitting: *Deleted

## 2016-07-27 NOTE — Patient Outreach (Signed)
Transition of care call successful, ongoing follow up on recent hospitalization 9/12 to 9/19 for COPD exacerbation, hx of lung cancer and lymphoma, DM.   Spoke with pt, HIPAA verified.  Pt reports called Dr. Raul Del to get an antibiotic for his congestion, MD called in prescription for Zithromax, started 10/9 and took last dose today. Pt reports congestion was not getting better using the nebulizer treatments.   Pt reports MD also called in cough medication.  Pt reports feeling better.     Plan: As discussed with pt, plan to follow up again next week telephonically (part of ongoing transition of care).  [name].   [place] Care Management  [phone]

## 2016-08-03 ENCOUNTER — Other Ambulatory Visit: Payer: Self-pay | Admitting: *Deleted

## 2016-08-03 NOTE — Patient Outreach (Signed)
Transition of care call successful, ongoing follow up on recent hospitalization  9/12-9/19 COPD exacerbation,hx of Lung cancer and lymphoma, DM.   Spoke with pt, HIPAA verified.  Pt reports cough and congestion are better, still doing nebulizer treatments/cough medication as needed.  RN CM reviewed  use of purse lip breathing as needed for sob to which pt voiced is doing.   Pt reports no sob, knows when to call MD if needed ( COPD yellow zone).    Pt reports to see Oncologist 10/23, talk about chemo medication to use.   Plan:  As discussed with pt, plan to follow up again next week telephonically for final transition of care call.    [name].   [place] Care Management  [phone]

## 2016-08-06 ENCOUNTER — Inpatient Hospital Stay (HOSPITAL_BASED_OUTPATIENT_CLINIC_OR_DEPARTMENT_OTHER): Payer: Commercial Managed Care - HMO | Admitting: Internal Medicine

## 2016-08-06 ENCOUNTER — Inpatient Hospital Stay: Payer: Commercial Managed Care - HMO | Attending: Internal Medicine

## 2016-08-06 ENCOUNTER — Other Ambulatory Visit: Payer: Self-pay | Admitting: *Deleted

## 2016-08-06 VITALS — BP 105/70 | HR 57 | Temp 97.8°F | Resp 18 | Wt 148.0 lb

## 2016-08-06 DIAGNOSIS — Z87891 Personal history of nicotine dependence: Secondary | ICD-10-CM | POA: Insufficient documentation

## 2016-08-06 DIAGNOSIS — C3411 Malignant neoplasm of upper lobe, right bronchus or lung: Secondary | ICD-10-CM | POA: Diagnosis not present

## 2016-08-06 DIAGNOSIS — C641 Malignant neoplasm of right kidney, except renal pelvis: Secondary | ICD-10-CM | POA: Diagnosis not present

## 2016-08-06 DIAGNOSIS — J449 Chronic obstructive pulmonary disease, unspecified: Secondary | ICD-10-CM

## 2016-08-06 DIAGNOSIS — C8300 Small cell B-cell lymphoma, unspecified site: Secondary | ICD-10-CM | POA: Insufficient documentation

## 2016-08-06 DIAGNOSIS — Z85038 Personal history of other malignant neoplasm of large intestine: Secondary | ICD-10-CM

## 2016-08-06 DIAGNOSIS — E1165 Type 2 diabetes mellitus with hyperglycemia: Secondary | ICD-10-CM | POA: Insufficient documentation

## 2016-08-06 DIAGNOSIS — R0789 Other chest pain: Secondary | ICD-10-CM

## 2016-08-06 DIAGNOSIS — C3492 Malignant neoplasm of unspecified part of left bronchus or lung: Secondary | ICD-10-CM

## 2016-08-06 DIAGNOSIS — Z809 Family history of malignant neoplasm, unspecified: Secondary | ICD-10-CM | POA: Insufficient documentation

## 2016-08-06 DIAGNOSIS — G4700 Insomnia, unspecified: Secondary | ICD-10-CM

## 2016-08-06 DIAGNOSIS — Z9221 Personal history of antineoplastic chemotherapy: Secondary | ICD-10-CM | POA: Insufficient documentation

## 2016-08-06 DIAGNOSIS — K219 Gastro-esophageal reflux disease without esophagitis: Secondary | ICD-10-CM | POA: Insufficient documentation

## 2016-08-06 DIAGNOSIS — R109 Unspecified abdominal pain: Secondary | ICD-10-CM | POA: Diagnosis not present

## 2016-08-06 DIAGNOSIS — I10 Essential (primary) hypertension: Secondary | ICD-10-CM | POA: Diagnosis not present

## 2016-08-06 DIAGNOSIS — Z9981 Dependence on supplemental oxygen: Secondary | ICD-10-CM | POA: Diagnosis not present

## 2016-08-06 DIAGNOSIS — J9610 Chronic respiratory failure, unspecified whether with hypoxia or hypercapnia: Secondary | ICD-10-CM

## 2016-08-06 LAB — CBC WITH DIFFERENTIAL/PLATELET
Basophils Absolute: 0 10*3/uL (ref 0–0.1)
Basophils Relative: 1 %
EOS ABS: 0.4 10*3/uL (ref 0–0.7)
EOS PCT: 8 %
HCT: 30.2 % — ABNORMAL LOW (ref 40.0–52.0)
Hemoglobin: 9.6 g/dL — ABNORMAL LOW (ref 13.0–18.0)
LYMPHS ABS: 0.9 10*3/uL — AB (ref 1.0–3.6)
Lymphocytes Relative: 17 %
MCH: 26.6 pg (ref 26.0–34.0)
MCHC: 31.9 g/dL — AB (ref 32.0–36.0)
MCV: 83.4 fL (ref 80.0–100.0)
MONOS PCT: 11 %
Monocytes Absolute: 0.6 10*3/uL (ref 0.2–1.0)
Neutro Abs: 3.3 10*3/uL (ref 1.4–6.5)
Neutrophils Relative %: 63 %
PLATELETS: 183 10*3/uL (ref 150–440)
RBC: 3.62 MIL/uL — ABNORMAL LOW (ref 4.40–5.90)
RDW: 17 % — AB (ref 11.5–14.5)
WBC: 5.2 10*3/uL (ref 3.8–10.6)

## 2016-08-06 LAB — COMPREHENSIVE METABOLIC PANEL
ALT: 13 U/L — ABNORMAL LOW (ref 17–63)
ANION GAP: 10 (ref 5–15)
AST: 18 U/L (ref 15–41)
Albumin: 4 g/dL (ref 3.5–5.0)
Alkaline Phosphatase: 87 U/L (ref 38–126)
BUN: 18 mg/dL (ref 6–20)
CALCIUM: 9 mg/dL (ref 8.9–10.3)
CHLORIDE: 93 mmol/L — AB (ref 101–111)
CO2: 33 mmol/L — AB (ref 22–32)
Creatinine, Ser: 1.14 mg/dL (ref 0.61–1.24)
GFR calc non Af Amer: >60 mL/min (ref >60)
Glucose, Bld: 144 mg/dL — ABNORMAL HIGH (ref 65–99)
POTASSIUM: 4.5 mmol/L (ref 3.5–5.1)
SODIUM: 136 mmol/L (ref 135–145)
Total Bilirubin: 0.7 mg/dL (ref 0.3–1.2)
Total Protein: 7.1 g/dL (ref 6.5–8.1)

## 2016-08-06 LAB — LACTATE DEHYDROGENASE: LDH: 157 U/L (ref 98–192)

## 2016-08-06 MED ORDER — HYDROCODONE-ACETAMINOPHEN 5-325 MG PO TABS: 1.0000 | ORAL_TABLET | Freq: Four times a day (QID) | ORAL | 0 refills | Status: DC | PRN

## 2016-08-06 NOTE — Progress Notes (Signed)
Patient is here for follow up, no complaints  Needs refill on Hydrocodone

## 2016-08-06 NOTE — Assessment & Plan Note (Signed)
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 Hemoglobin approximately 9.6. Recommended Ibrutinib at last visit; not received yet.   # Lung ca- history of local recurrence; currently on surveillance most recent imaging negative for any progression.  #  Kidney cancer status post cryoablation most recent imaging August 2017-no clinical evidence of progression.  # Pain- chest when coughing- new narcotic script given.   # follow up with me in 4 weeks/labs.

## 2016-08-06 NOTE — Patient Outreach (Signed)
Final transition of care call successful,ongoing follow up on recent hospitalization 9/12-9/19 COPD exacerbation, hx of Lung cancer and lymphoma, DM.   Spoke with pt, HIPAA verified, reports breathing is better, very little coughing, not using cough medication.  Pt reports on visit today with Oncologist, pt to start on chemo pill, should get medication in the mail in a week or so.   Pt reports sugars are good- 120.    Pt reports continues use of purse lip breathing as needed, knows when to call MD for COPD.   Discussed with pt today final transition of care call, no need to follow up with ongoing community nurse case management services to which pt agreed.    Plan:  RN CM to close case.           As discussed with pt, case closure letter to be sent to pt, includes [facility]'s main contact number to call if needs arise in the future.           Plan to send Dr. Lovie Macadamia case closure letter.          Plan to inform [facility] care management assistant to close case.           Care plan updated.    Craig [name].   [place] Care Management  [phone]

## 2016-08-06 NOTE — Progress Notes (Signed)
[place] OFFICE PROGRESS NOTE  Patient Care Team: [name], MD as PCP - General (Family Medicine)  Lung cancer, upper lobe [facility])   Staging form: Lung, AJCC 7th Edition   - Clinical stage from 06/15/2010: yT2, N2, M0 - Signed by [name], NP on 02/03/2015   - Pathologic stage from 04/04/2014: yT2, N2, M1 - Signed by [name], NP on 02/03/2015  Lymphoma, small lymphocytic ([place])   Staging form: Lymphoid Neoplasms, AJCC 6th Edition   - Clinical stage from 10/15/2013: Stage II - Signed by [name], NP on 02/03/2015  Renal cell cancer [facility])   Staging form: Kidney, AJCC 7th Edition   - Clinical stage from 02/03/2015: Stage I (yT1a, N0, M0) - Signed by [name], NP on 02/03/2015   Oncology History   1. Right upper lobe lung mass biopsies positive for adenocarcinoma; PET- T2, N2, M0;  stage IIIA;  carboplatinum Alimta and Avastin;  [april  9th of 2013]  Patient had an allergic reaction to carboplatinum with acute bronchospasm in May of 2013; carbo-discontinued.; Maintenance Alimta and Avastin  from June of 2013 hold chemotherapy because of increasing shortness of breath (in July, 2013); VARISTRAT-GOOD; Started on [place] February 3 about 2014; Diarrhea 4 days after Tarceva was started.  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   # SEP 2017- GAZYVA x1 [stopped sec to respi issues]  # RIGHT KIDNEY CA- RCC [s/p Bx]- s/p Cryo [Dr.Yamagat; IR; 2016]   #  Poor pulmonary function; 3 L home O2.     Lung cancer, upper lobe ([place])   02/03/2015 Initial Diagnosis    Lung cancer, upper lobe        Lymphoma, small lymphocytic ([place])   02/03/2015 Initial Diagnosis    Lymphoma, small lymphocytic ([facility])      Cancer of lung ([place])   12/21/2015 Initial Diagnosis    Cancer of lung ([place])      Primary cancer of right upper lobe of lung ([place])   06/01/2016 Initial Diagnosis    Primary cancer of right upper lobe of lung ([place])       INTERVAL HISTORY:  [name]. 65 y.o.  male pleasant patientHistory of chronic respiratory failure on home O2 with above history ofMultiple malignancies- lung with recurrence- treatment currently on hold; also kidney cancer status post cryoablation and also SLL/CLL-Treatments currently on hold is here for follow-up  Patient was supposed to start ibrutinib; however he has not started as his pills are not available  Patient complains of intermittent pain in his abdomen and also the chest wall. He has been taking oxycodone. He denies any weight loss. Denies any night sweats. His chronic shortness of breath on 3 L of oxygen; Currently breathing is at baseline.  REVIEW OF SYSTEMS:  A complete 10 point review of system is done which is negative except mentioned above/history of present illness.   PAST MEDICAL HISTORY :  Past Medical History:  Diagnosis Date  . Asthma   . Cancer ([place])   . Colon cancer ([place])   . COPD (  chronic obstructive pulmonary disease) ([place])   . Diabetes mellitus without complication ([place])   . Difficulty sleeping   . Dysrhythmia   . GERD (gastroesophageal reflux disease)   . Glaucoma   . History of bronchitis   . Hypertension   . Lung cancer ([place])    right upper lobe mass positive for adenocarcinoma  . Lymphoma ([facility])    low grade  . Renal cancer ([place])   . Respiratory failure ([place]) 07/12/2010   acute  . Right renal mass   . Shortness of breath dyspnea   . Stroke ([place]) 1990'S   NO RESIDUAL PROBLEMS  . Supplemental oxygen dependent    USES 3 LITERS CONTINUOUSLY    PAST SURGICAL HISTORY :  No past surgical history on  file.  FAMILY HISTORY :   Family History  Problem Relation Age of Onset  . Diabetes Mellitus II Mother   . Hypertension Mother   . Cancer Mother   . Heart disease Father   . Hypertension Father   . Tuberculosis Father   . Diabetes Mellitus II Sister   . Cancer Sister   . Prostate cancer Brother     SOCIAL HISTORY:   Social History  Substance Use Topics  . Smoking status: Former Smoker    Packs/day: 1.00    Years: 30.00    Types: Cigarettes    Start date: 12/28/1968    Quit date: 01/13/2010  . Smokeless tobacco: Never Used  . Alcohol use No     Comment: stopped 2002     ALLERGIES:  is allergic to iodinated diagnostic agents and nexium [esomeprazole magnesium].  MEDICATIONS:  Current Outpatient Prescriptions  Medication Sig Dispense Refill  . albuterol (PROVENTIL HFA;VENTOLIN HFA) 108 (90 BASE) MCG/ACT inhaler Inhale 2 puffs into the lungs every 6 (six) hours as needed for wheezing or shortness of breath.     Marland Kitchen albuterol (PROVENTIL) (2.5 MG/3ML) 0.083% nebulizer solution Take 2.5 mg by nebulization every 4 (four) hours.    . budesonide-formoterol (SYMBICORT) 160-4.5 MCG/ACT inhaler Inhale 2 puffs into the lungs 2 (two) times daily.     Marland Kitchen dextromethorphan (DELSYM) 30 MG/5ML liquid Take 5 mLs (30 mg total) by mouth 2 (two) times daily. 89 mL 0  . diphenhydrAMINE (BENADRYL) 25 MG tablet Take 2 tablets (50 mg total) by mouth as directed. (Patient taking differently: Take 25 mg by mouth as needed for itching or allergies. ) 2 tablet 0  . glipiZIDE (GLUCOTROL) 5 MG tablet Take by mouth daily.    Marland Kitchen HYDROcodone-acetaminophen (NORCO/VICODIN) 5-325 MG tablet Take 1-2 tablets by mouth every 6 (six) hours as needed for moderate pain. Please Note new directions 120 tablet 0  . loperamide (IMODIUM A-D) 2 MG tablet Take 2 mg by mouth 4 (four) times daily as needed for diarrhea or loose stools. Reported on 12/21/2015    . losartan-hydrochlorothiazide (HYZAAR) 50-12.5 MG per tablet Take 1 tablet  by mouth daily.    . pioglitazone (ACTOS) 30 MG tablet Take 30 mg by mouth daily.    . predniSONE (DELTASONE) 10 MG tablet TAKE 1 TABLET (10 MG TOTAL) BY MOUTH DAILY WITH BREAKFAST. 30 tablet 3  . temazepam (RESTORIL) 7.5 MG capsule Take 7.5-15 mg by mouth at bedtime as needed for sleep. Reported on 12/21/2015  2  . theophylline (UNIPHYL) 400 MG 24 hr tablet Take 400 mg by mouth daily as needed (For breathing.).     Marland Kitchen tiotropium (SPIRIVA) 18 MCG inhalation capsule Place 18 mcg into inhaler and  inhale daily.     Marland Kitchen ibrutinib (IMBRUVICA) 140 MG capsul Take 3 capsules (420 mg total) by mouth daily. 90 capsule 3   No current facility-administered medications for this visit.    Facility-Administered Medications Ordered in Other Visits  Medication Dose Route Frequency Provider Last Rate Last Dose  . sodium chloride 0.9 % injection 10 mL  10 mL Intracatheter PRN Forest Gleason, MD   10 mL at 04/28/15 1410  . sodium chloride 0.9 % injection 10 mL  10 mL Intravenous PRN Forest Gleason, MD   10 mL at 05/12/15 1350    PHYSICAL EXAMINATION: ECOG PERFORMANCE STATUS: 2 - Symptomatic, <50% confined to bed  BP 105/70 (BP Location: Left Arm, Patient Position: Sitting)   Pulse (!) 57   Temp 97.8 F (36.6 C) (Tympanic)   Resp 18   Wt 148 lb (67.1 kg)   SpO2 92% Comment: with oxygen  BMI 22.50 kg/m   Filed Weights   08/06/16 1049  Weight: 148 lb (67.1 kg)    GENERAL: Well-nourished well-developed; Alert, no distress and comfortable.   Alone. He is on home oxygen. He is in a wheelchair.  EYES: no pallor or icterus OROPHARYNX: no thrush or ulceration; good dentition  NECK: supple, no masses felt LYMPH:  no palpable lymphadenopathy in the cervical, axillary or inguinal regions LUNGS:Bilateral decreased air entry to auscultationn and  No wheeze or crackles HEART/CVS: regular rate & rhythm and no murmurs; No lower extremity edema ABDOMEN:abdomen soft, non-tender and normal bowel sounds Musculoskeletal:no  cyanosis of digits and no clubbing  PSYCH: alert & oriented x 3 with fluent speech NEURO: no focal motor/sensory deficits SKIN:  no rashes or significant lesions  LABORATORY DATA:  I have reviewed the data as listed    Component Value Date/Time   NA 136 08/06/2016 0955   NA 139 01/17/2015 0913   K 4.5 08/06/2016 0955   K 3.8 01/17/2015 0913   CL 93 (L) 08/06/2016 0955   CL 99 (L) 01/17/2015 0913   CO2 33 (H) 08/06/2016 0955   CO2 35 (H) 01/17/2015 0913   GLUCOSE 144 (H) 08/06/2016 0955   GLUCOSE 169 (H) 01/17/2015 0913   BUN 18 08/06/2016 0955   BUN 15 01/17/2015 0913   CREATININE 1.14 08/06/2016 0955   CREATININE 1.23 01/17/2015 0913   CALCIUM 9.0 08/06/2016 0955   CALCIUM 8.8 (L) 01/17/2015 0913   PROT 7.1 08/06/2016 0955   PROT 6.6 01/17/2015 0913   ALBUMIN 4.0 08/06/2016 0955   ALBUMIN 3.7 01/17/2015 0913   AST 18 08/06/2016 0955   AST 20 01/17/2015 0913   ALT 13 (L) 08/06/2016 0955   ALT 15 (L) 01/17/2015 0913   ALKPHOS 87 08/06/2016 0955   ALKPHOS 76 01/17/2015 0913   BILITOT 0.7 08/06/2016 0955   BILITOT 0.5 01/17/2015 0913   GFRNONAA >60 08/06/2016 0955   GFRNONAA >60 01/17/2015 0913   GFRAA >60 08/06/2016 0955   GFRAA >60 01/17/2015 0913    No results found for: SPEP, UPEP  Lab Results  Component Value Date   WBC 5.2 08/06/2016   NEUTROABS 3.3 08/06/2016   HGB 9.6 (L) 08/06/2016   HCT 30.2 (L) 08/06/2016   MCV 83.4 08/06/2016   PLT 183 08/06/2016      Chemistry      Component Value Date/Time   NA 136 08/06/2016 0955   NA 139 01/17/2015 0913   K 4.5 08/06/2016 0955   K 3.8 01/17/2015 0913   CL 93 (L) 08/06/2016  0955   CL 99 (L) 01/17/2015 0913   CO2 33 (H) 08/06/2016 0955   CO2 35 (H) 01/17/2015 0913   BUN 18 08/06/2016 0955   BUN 15 01/17/2015 0913   CREATININE 1.14 08/06/2016 0955   CREATININE 1.23 01/17/2015 0913      Component Value Date/Time   CALCIUM 9.0 08/06/2016 0955   CALCIUM 8.8 (L) 01/17/2015 0913   ALKPHOS 87 08/06/2016  0955   ALKPHOS 76 01/17/2015 0913   AST 18 08/06/2016 0955   AST 20 01/17/2015 0913   ALT 13 (L) 08/06/2016 0955   ALT 15 (L) 01/17/2015 0913   BILITOT 0.7 08/06/2016 0955   BILITOT 0.5 01/17/2015 0913       RADIOGRAPHIC STUDIES: I have personally reviewed the radiological images as listed and agreed with the findings in the report. No results found.   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 Hemoglobin approximately 9.6. Recommended Ibrutinib at last visit; not received yet.   # Lung ca- history of local recurrence; currently on surveillance most recent imaging negative for any progression.  #  Kidney cancer status post cryoablation most recent imaging August 2017-no clinical evidence of progression.  # Pain- chest when coughing- new narcotic script given.   # follow up with me in 4 weeks/labs.    No orders of the defined types were placed in this encounter.  All questions were answered. The patient knows to call the clinic with any problems, questions or concerns.      [name], MD 08/09/2016 5:50 PM

## 2016-08-07 ENCOUNTER — Encounter: Payer: Self-pay | Admitting: *Deleted

## 2016-08-07 ENCOUNTER — Other Ambulatory Visit: Payer: Self-pay

## 2016-08-07 DIAGNOSIS — C3410 Malignant neoplasm of upper lobe, unspecified bronchus or lung: Secondary | ICD-10-CM

## 2016-08-09 ENCOUNTER — Other Ambulatory Visit: Payer: Self-pay

## 2016-08-09 MED ORDER — IBRUTINIB 140 MG PO CAPS: 420.0000 mg | ORAL_CAPSULE | Freq: Every day | ORAL | 3 refills | Status: DC

## 2016-08-19 ENCOUNTER — Other Ambulatory Visit: Payer: Self-pay | Admitting: Oncology

## 2016-08-21 ENCOUNTER — Emergency Department: Payer: Commercial Managed Care - HMO

## 2016-08-21 ENCOUNTER — Emergency Department
Admission: EM | Admit: 2016-08-21 | Discharge: 2016-08-22 | Disposition: A | Discharge status: Home / Self Care | Payer: Commercial Managed Care - HMO | Attending: Emergency Medicine | Admitting: Emergency Medicine

## 2016-08-21 DIAGNOSIS — Z87891 Personal history of nicotine dependence: Secondary | ICD-10-CM | POA: Diagnosis not present

## 2016-08-21 DIAGNOSIS — Z85038 Personal history of other malignant neoplasm of large intestine: Secondary | ICD-10-CM | POA: Diagnosis not present

## 2016-08-21 DIAGNOSIS — Z7984 Long term (current) use of oral hypoglycemic drugs: Secondary | ICD-10-CM | POA: Insufficient documentation

## 2016-08-21 DIAGNOSIS — R079 Chest pain, unspecified: Secondary | ICD-10-CM | POA: Diagnosis not present

## 2016-08-21 DIAGNOSIS — J449 Chronic obstructive pulmonary disease, unspecified: Secondary | ICD-10-CM | POA: Diagnosis not present

## 2016-08-21 DIAGNOSIS — I10 Essential (primary) hypertension: Secondary | ICD-10-CM | POA: Diagnosis not present

## 2016-08-21 DIAGNOSIS — Z85528 Personal history of other malignant neoplasm of kidney: Secondary | ICD-10-CM | POA: Diagnosis not present

## 2016-08-21 DIAGNOSIS — E119 Type 2 diabetes mellitus without complications: Secondary | ICD-10-CM | POA: Insufficient documentation

## 2016-08-21 DIAGNOSIS — Z79899 Other long term (current) drug therapy: Secondary | ICD-10-CM | POA: Insufficient documentation

## 2016-08-21 DIAGNOSIS — R42 Dizziness and giddiness: Secondary | ICD-10-CM

## 2016-08-21 DIAGNOSIS — R062 Wheezing: Secondary | ICD-10-CM

## 2016-08-21 DIAGNOSIS — F419 Anxiety disorder, unspecified: Secondary | ICD-10-CM | POA: Diagnosis not present

## 2016-08-21 LAB — BASIC METABOLIC PANEL
Anion gap: 7 (ref 5–15)
BUN: 20 mg/dL (ref 6–20)
CHLORIDE: 93 mmol/L — AB (ref 101–111)
CO2: 39 mmol/L — AB (ref 22–32)
CREATININE: 1.25 mg/dL — AB (ref 0.61–1.24)
Calcium: 8.7 mg/dL — ABNORMAL LOW (ref 8.9–10.3)
GFR calc non Af Amer: 59 mL/min — ABNORMAL LOW (ref >60)
Glucose, Bld: 218 mg/dL — ABNORMAL HIGH (ref 65–99)
Potassium: 4.8 mmol/L (ref 3.5–5.1)
Sodium: 139 mmol/L (ref 135–145)

## 2016-08-21 LAB — CBC WITH DIFFERENTIAL/PLATELET
Basophils Absolute: 0.1 10*3/uL (ref 0–0.1)
Basophils Relative: 2 %
EOS ABS: 0 10*3/uL (ref 0–0.7)
Eosinophils Relative: 1 %
HCT: 29.9 % — ABNORMAL LOW (ref 40.0–52.0)
HEMOGLOBIN: 9.8 g/dL — AB (ref 13.0–18.0)
LYMPHS ABS: 0.4 10*3/uL — AB (ref 1.0–3.6)
Lymphocytes Relative: 9 %
MCH: 27.3 pg (ref 26.0–34.0)
MCHC: 32.7 g/dL (ref 32.0–36.0)
MCV: 83.6 fL (ref 80.0–100.0)
MONOS PCT: 4 %
Monocytes Absolute: 0.2 10*3/uL (ref 0.2–1.0)
NEUTROS PCT: 86 %
Neutro Abs: 4.4 10*3/uL (ref 1.4–6.5)
Platelets: 171 10*3/uL (ref 150–440)
RBC: 3.58 MIL/uL — ABNORMAL LOW (ref 4.40–5.90)
RDW: 16.9 % — ABNORMAL HIGH (ref 11.5–14.5)
WBC: 5.2 10*3/uL (ref 3.8–10.6)

## 2016-08-21 NOTE — ED Notes (Signed)
Patient transported to X-ray 

## 2016-08-21 NOTE — ED Triage Notes (Signed)
Pt from home via EMS, hx of lung cancer and began a new med. Pt C/O about hypertension since taking med

## 2016-08-21 NOTE — ED Provider Notes (Signed)
[facility] Emergency Department Provider Note  ____________________________________________   First MD Initiated Contact with Patient 08/21/16 2115     (approximate)  I have reviewed the triage vital signs and the nursing notes.   HISTORY   Chief Complaint Hypertension    HPI Craig [name]. is a 65 y.o. male with a cup give history of lung cancer and other chronic medical issues who sees Dr. Rogue Bussing and recently started on a new chemotherapeutic agent.  He reports feeling "strange" after starting on the medicine and he was worried his blood pressure might be elevated because of a vague sensation of lightheadedness.  He denies fever/chills, chest pain, shortness of breath, nausea, vomiting, diarrhea, dysuria.  He has had no numbness or tingling in any of his extremities.  He denies headache, neck pain, visual changes.  He cannot really describe exactly how he felt except that he was sure his blood pressure was going to be elevated.  He describes symptoms as mild but bothersome and present since yesterday, gradual in onset.   Past Medical History:  Diagnosis Date  . Asthma   . Cancer ([place])   . Colon cancer ([place])   . COPD (chronic obstructive pulmonary disease) ([place])   . Diabetes mellitus without complication ([place])   . Difficulty sleeping   . Dysrhythmia   . GERD (gastroesophageal reflux disease)   . Glaucoma   . History of bronchitis   . Hypertension   . 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 Lymphoma ([facility])    low grade  . Renal cancer ([place])   . Respiratory failure ([place]) 07/12/2010   acute  . Right renal mass   . Shortness of breath dyspnea   . Stroke ([place]) 1990'S   NO RESIDUAL PROBLEMS  . Supplemental oxygen dependent    USES 3 LITERS CONTINUOUSLY    Patient Active Problem List   Diagnosis Date Noted  . Chronic bronchitis ([place]) 06/02/2016  . Chronic renal insufficiency 06/02/2016  . Benign  essential HTN 06/02/2016  . Diabetes ([place]) 06/02/2016  . Primary cancer of right upper lobe of lung ([place]) 06/01/2016  . Renal carcinoma ([place]) 02/29/2016  . Cancer of lung ([place]) 12/21/2015  . COPD exacerbation ([place]) 11/19/2015  . Acute bronchitis 11/19/2015  . SOB (shortness of breath) 11/19/2015  . Acute respiratory distress 11/19/2015  . Glaucoma 04/15/2015  . 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 BP (high blood pressure) 03/02/2015  . Cannot sleep 03/02/2015  . Controlled type 2 diabetes mellitus without complication ([place]) 26/71/2458  . Lung cancer, upper lobe ([place]) 02/03/2015  . Renal cell cancer ([place]) 02/03/2015  . 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 No pertinent surgical history.  Prior to Admission medications   Medication Sig Start Date End Date Taking? Authorizing Provider  albuterol (PROVENTIL) (2.5 MG/3ML) 0.083% nebulizer solution USE 1 VIAL IN NEBULIZER EVERY 3-4 HOURS AS NEEDED 08/20/16  Yes [name], MD  budesonide-formoterol (SYMBICORT) 160-4.5 MCG/ACT inhaler Inhale 2 puffs into the lungs 2 (two) times daily.    Yes Historical Provider, MD  dextromethorphan (DELSYM) 30 MG/5ML liquid Take 5 mLs (30 mg total) by mouth 2 (two) times daily. 06/02/16  Yes [name], MD  diphenhydrAMINE (BENADRYL) 25 MG tablet Take 2 tablets (50 mg total) by mouth as directed. Patient taking differently: Take 25 mg by mouth as needed for itching or allergies.  04/12/16  Yes Aletta Edouard, MD  glipiZIDE (GLUCOTROL) 5 MG tablet Take 5 mg by mouth daily before breakfast.    Yes Historical Provider, MD  HYDROcodone-acetaminophen (NORCO/VICODIN) 5-325 MG tablet Take 1-2 tablets by mouth every 6 (six) hours as needed for moderate pain. Please Note new directions 08/06/16  Yes [name], MD  ibrutinib (IMBRUVICA) 140 MG capsul Take 3 capsules (420 mg total) by mouth daily. 08/09/16  Yes [name], MD  loperamide (IMODIUM A-D) 2 MG tablet Take 2  mg by mouth 4 (four) times daily as needed for diarrhea or loose stools. Reported on 12/21/2015   Yes Historical Provider, MD  losartan-hydrochlorothiazide (HYZAAR) 50-12.5 MG per tablet Take 1 tablet by mouth daily.   Yes Historical Provider, MD  pioglitazone (ACTOS) 30 MG tablet Take 30 mg by mouth daily.   Yes Historical Provider, MD  predniSONE (DELTASONE) 10 MG tablet TAKE 1 TABLET (10 MG TOTAL) BY MOUTH DAILY WITH BREAKFAST. 07/04/16  Yes [name], MD  temazepam (RESTORIL) 7.5 MG capsule Take 15 mg by mouth at bedtime as needed for sleep. Reported on 12/21/2015 05/02/15  Yes Historical Provider, MD  theophylline (UNIPHYL) 400 MG 24 hr tablet Take 400 mg by mouth daily as needed (For breathing.).  11/11/15  Yes Historical Provider, MD  tiotropium (SPIRIVA) 18 MCG inhalation capsule Place 18 mcg into inhaler and inhale daily.    Yes Historical Provider, MD  albuterol (PROVENTIL HFA;VENTOLIN HFA) 108 (90 BASE) MCG/ACT inhaler Inhale 2 puffs into the lungs every 6 (six) hours as needed for wheezing or shortness of breath.     Historical Provider, MD    Allergies Iodinated diagnostic agents and Nexium [esomeprazole magnesium]  Family History  Problem Relation Age of Onset  . Diabetes Mellitus II Mother   . Hypertension Mother   . Cancer Mother   . Heart disease Father   . Hypertension Father   . Tuberculosis Father   . Diabetes Mellitus II Sister   . Cancer Sister   . Prostate cancer Brother     Social History Social History  Substance Use Topics  . Smoking status: Former Smoker    Packs/day: 1.00    Years: 30.00    Types: Cigarettes    Start date: 12/28/1968    Quit date: 01/13/2010  . Smokeless tobacco: Never Used  . Alcohol use No     Comment: stopped 2002     Review of Systems Constitutional: No fever/chills Eyes: No visual changes. ENT: No sore throat. Cardiovascular: Denies chest pain. Respiratory: Denies shortness of breath. Gastrointestinal: No abdominal pain.   No nausea, no vomiting.  No diarrhea.  No constipation. Genitourinary: Negative for dysuria. Musculoskeletal: Negative for back pain. Skin: Negative for rash. Neurological: Negative for headaches, focal weakness or numbness.  Somewhat lightheaded.  10-point ROS otherwise negative.  ____________________________________________   PHYSICAL EXAM:  VITAL SIGNS: ED Triage Vitals  Enc Vitals Group     BP 08/21/16 2102 121/81     Pulse Rate 08/21/16 2100 (!) 106     Resp 08/21/16 2100 (!) 26     Temp 08/21/16 2100 98.4 F (36.9 C)     Temp src --      SpO2 08/21/16 2100 100 %     Weight 08/21/16 2057 148 lb (67.1 kg)     Height 08/21/16 2057 '5\' 8"'$  (1.727 m)     Head Circumference --      Peak Flow --      Pain Score 08/21/16 2058 0  Pain Loc --      Pain Edu? --      Excl. in [place]? --     Constitutional: Alert and oriented. Well appearing and in no acute distress. Eyes: Conjunctivae are normal. PERRL. EOMI. Head: Atraumatic. Nose: No congestion/rhinnorhea. Mouth/Throat: Mucous membranes are moist.  Oropharynx non-erythematous. Neck: No stridor.  No meningeal signs.   Cardiovascular: Normal rate, regular rhythm. Good peripheral circulation. Grossly normal heart sounds. Respiratory: Normal respiratory effort.  No retractions. Lungs CTAB. Wearing oxygen. Gastrointestinal: Soft and nontender. No distention.  Musculoskeletal: No lower extremity tenderness nor edema. No gross deformities of extremities. Neurologic:  Normal speech and language. No gross focal neurologic deficits are appreciated.  Skin:  Skin is warm, dry and intact. No rash noted. Psychiatric: Mood and affect are normal. Speech and behavior are normal.  ____________________________________________   LABS (all labs ordered are listed, but only abnormal results are displayed)  Labs Reviewed  BASIC METABOLIC PANEL - Abnormal; Notable for the following:       Result Value   Chloride 93 (*)    CO2 39 (*)     Glucose, Bld 218 (*)    Creatinine, Ser 1.25 (*)    Calcium 8.7 (*)    GFR calc non Af Amer 59 (*)    All other components within normal limits  CBC WITH DIFFERENTIAL/PLATELET - Abnormal; Notable for the following:    RBC 3.58 (*)    Hemoglobin 9.8 (*)    HCT 29.9 (*)    RDW 16.9 (*)    Lymphs Abs 0.4 (*)    All other components within normal limits   ____________________________________________  EKG  None - EKG not ordered by ED physician ____________________________________________  RADIOLOGY   No results found.  ____________________________________________   PROCEDURES  Procedure(s) performed:   Procedures   Critical Care performed: No ____________________________________________   INITIAL IMPRESSION / ASSESSMENT AND PLAN / ED COURSE  Pertinent labs & imaging results that were available during my care of the patient were reviewed by me and considered in my medical decision making (see chart for details).  The patient is well-appearing in spite of his chronic medical issues and he is in no acute distress at this time.  His blood pressure is completely normal.  He has a very slight tachycardia at 106.  I discussed with him that his blood pressure is normal and he said, "okay, that's good!"  We decided we would check basic labs to make sure there are no acute abnormalities and anticipate out patient follow-up.  He is comfortable with this plan.   Clinical Course as of Aug 21 2321  Tue Aug 21, 2016  2233 Labs are consistent with priors.  No acute abnormality.  I went in to tell the patient about the good news and when I told them instead of being reassured he became more anxious.  He said that he still feels hot all over and now he feels some numbness around his mouth.  He became somewhat frustrated and a little bit angry when I told him that he could go home and follow-up as an outpatient.  [CF]    Clinical Course User Index [CF] Hinda Kehr, MD     ____________________________________________  FINAL CLINICAL IMPRESSION(S) / ED DIAGNOSES  Final diagnoses:  Lightheaded     MEDICATIONS GIVEN DURING THIS VISIT:  Medications - No data to display   NEW OUTPATIENT MEDICATIONS STARTED DURING THIS VISIT:  New Prescriptions   No medications on file  Modified Medications   No medications on file    Discontinued Medications   No medications on file     Note:  This document was prepared using Dragon voice recognition software and may include unintentional dictation errors.    Hinda Kehr, MD 08/21/16 2322

## 2016-08-22 ENCOUNTER — Inpatient Hospital Stay: Payer: Commercial Managed Care - HMO | Attending: Hematology and Oncology | Admitting: Hematology and Oncology

## 2016-08-22 ENCOUNTER — Encounter: Payer: Self-pay | Admitting: Hematology and Oncology

## 2016-08-22 DIAGNOSIS — Z85038 Personal history of other malignant neoplasm of large intestine: Secondary | ICD-10-CM

## 2016-08-22 DIAGNOSIS — Z7984 Long term (current) use of oral hypoglycemic drugs: Secondary | ICD-10-CM | POA: Diagnosis not present

## 2016-08-22 DIAGNOSIS — C8300 Small cell B-cell lymphoma, unspecified site: Secondary | ICD-10-CM | POA: Diagnosis not present

## 2016-08-22 DIAGNOSIS — Z9981 Dependence on supplemental oxygen: Secondary | ICD-10-CM | POA: Insufficient documentation

## 2016-08-22 DIAGNOSIS — C3411 Malignant neoplasm of upper lobe, right bronchus or lung: Secondary | ICD-10-CM | POA: Diagnosis not present

## 2016-08-22 DIAGNOSIS — Z7951 Long term (current) use of inhaled steroids: Secondary | ICD-10-CM

## 2016-08-22 DIAGNOSIS — Z79899 Other long term (current) drug therapy: Secondary | ICD-10-CM | POA: Insufficient documentation

## 2016-08-22 DIAGNOSIS — G893 Neoplasm related pain (acute) (chronic): Secondary | ICD-10-CM | POA: Diagnosis not present

## 2016-08-22 DIAGNOSIS — J449 Chronic obstructive pulmonary disease, unspecified: Secondary | ICD-10-CM | POA: Diagnosis not present

## 2016-08-22 DIAGNOSIS — C641 Malignant neoplasm of right kidney, except renal pelvis: Secondary | ICD-10-CM | POA: Diagnosis not present

## 2016-08-22 DIAGNOSIS — Z87891 Personal history of nicotine dependence: Secondary | ICD-10-CM | POA: Diagnosis not present

## 2016-08-22 DIAGNOSIS — I10 Essential (primary) hypertension: Secondary | ICD-10-CM | POA: Diagnosis not present

## 2016-08-22 DIAGNOSIS — E119 Type 2 diabetes mellitus without complications: Secondary | ICD-10-CM | POA: Insufficient documentation

## 2016-08-22 DIAGNOSIS — Z809 Family history of malignant neoplasm, unspecified: Secondary | ICD-10-CM | POA: Diagnosis not present

## 2016-08-22 DIAGNOSIS — C3492 Malignant neoplasm of unspecified part of left bronchus or lung: Secondary | ICD-10-CM

## 2016-08-22 DIAGNOSIS — Z7952 Long term (current) use of systemic steroids: Secondary | ICD-10-CM | POA: Insufficient documentation

## 2016-08-22 DIAGNOSIS — D649 Anemia, unspecified: Secondary | ICD-10-CM | POA: Diagnosis not present

## 2016-08-22 DIAGNOSIS — I499 Cardiac arrhythmia, unspecified: Secondary | ICD-10-CM | POA: Insufficient documentation

## 2016-08-22 DIAGNOSIS — Z8042 Family history of malignant neoplasm of prostate: Secondary | ICD-10-CM | POA: Insufficient documentation

## 2016-08-22 DIAGNOSIS — R42 Dizziness and giddiness: Secondary | ICD-10-CM | POA: Insufficient documentation

## 2016-08-22 DIAGNOSIS — Z9221 Personal history of antineoplastic chemotherapy: Secondary | ICD-10-CM | POA: Diagnosis not present

## 2016-08-22 DIAGNOSIS — K219 Gastro-esophageal reflux disease without esophagitis: Secondary | ICD-10-CM | POA: Diagnosis not present

## 2016-08-22 DIAGNOSIS — R000 Tachycardia, unspecified: Secondary | ICD-10-CM | POA: Insufficient documentation

## 2016-08-22 DIAGNOSIS — D630 Anemia in neoplastic disease: Secondary | ICD-10-CM

## 2016-08-22 MED ORDER — PREDNISONE 20 MG PO TABS
40.0000 mg | ORAL_TABLET | Freq: Once | ORAL | Status: AC
  Administered 2016-08-22: 40 mg via ORAL

## 2016-08-22 MED ORDER — IPRATROPIUM-ALBUTEROL 0.5-2.5 (3) MG/3ML IN SOLN
3.0000 mL | Freq: Once | RESPIRATORY_TRACT | Status: AC
  Administered 2016-08-22: 3 mL via RESPIRATORY_TRACT
  Filled 2016-08-22: qty 3

## 2016-08-22 MED ORDER — HYDROCODONE-ACETAMINOPHEN 5-325 MG PO TABS: 1.0000 | ORAL_TABLET | Freq: Four times a day (QID) | ORAL | 0 refills | Status: DC | PRN

## 2016-08-22 MED ORDER — PREDNISONE 20 MG PO TABS
ORAL_TABLET | ORAL | Status: AC
  Administered 2016-08-22: 40 mg via ORAL
  Filled 2016-08-22: qty 2

## 2016-08-22 MED ORDER — PREDNISONE 20 MG PO TABS: 40.0000 mg | ORAL_TABLET | Freq: Every day | ORAL | 0 refills | Status: DC

## 2016-08-22 NOTE — ED Notes (Signed)

## 2016-08-22 NOTE — Assessment & Plan Note (Signed)
The cause of his tachycardia is multi-factorial likely combination from anemia, COPD and others The heart rhythm is regular and he is not in atrial fibrillation His vital signs including blood pressure, temperature and respiratory rate were within acceptable normal limits Continue close observation for now

## 2016-08-22 NOTE — Assessment & Plan Note (Signed)
The patient takes hydrocodone for chest wall pain/back pain I refill his prescriptions today and warned him about risk of side effects including nausea and constipation

## 2016-08-22 NOTE — ED Provider Notes (Signed)
Dg Chest 2 View  Result Date: 08/22/2016 CLINICAL DATA:  Initial evaluation for acute chest pain/chest discomfort, subjective fever. History of lung cancer, on chemotherapy. EXAM: CHEST  2 VIEW COMPARISON:  Prior radiograph from 07/02/2016. FINDINGS: Left-sided Port-A-Cath in place with tip overlying the distal SVC, stable. Cardiac and mediastinal silhouettes are unchanged. Lungs are hyperinflated with underlying emphysematous changes. No focal infiltrates identified. No pulmonary edema or pleural effusion. No pneumothorax. No acute osseous abnormality. Multiple remotely healed left-sided rib fractures noted. IMPRESSION: 1. No active cardiopulmonary disease. 2. Emphysema. Electronically Signed   By: Jeannine Boga M.D.   On: 08/22/2016 00:05   Ct Head Wo Contrast  Result Date: 08/22/2016 CLINICAL DATA:  Lightheadedness. EXAM: CT HEAD WITHOUT CONTRAST TECHNIQUE: Contiguous axial images were obtained from the base of the skull through the vertex without intravenous contrast. COMPARISON:  Head CT 05/23/2006 FINDINGS: Brain: No mass lesion, intraparenchymal hemorrhage or extra-axial collection. No evidence of acute cortical infarct. Brain parenchyma and CSF-containing spaces are normal for age. Vascular: No hyperdense vessel or unexpected calcification. Skull: Normal visualized skull base and calvarium. Indeterminate hyperdense focus at the right parietal scalp. Sinuses/Orbits: No sinus fluid levels or advanced mucosal thickening. No mastoid effusion. Normal orbits. IMPRESSION: Normal brain for age. Electronically Signed   By: Ulyses Jarred M.D.   On: 08/22/2016 00:06    ----------------------------------------- 2:10 AM on 08/22/2016 -----------------------------------------  Patient reports he feels much better. Not having any dyspnea. Work of breathing is now normalized. No previous when I had evaluated him at about 1 AM, he had mild accessory muscle use and slight expiratory wheezing. After 2  nebulizer treatments and prednisone here reports all symptoms improved. Saturating normally on room air, normal respiratory effort at this time. Have discussed this case with Dr. Sherrine Maples who advises he would like to see him at 9 AM this morning in the clinic at oncology, patient is agreeable with this plan as well as seeing Dr. Lovie Macadamia at 10 AM as already planned.  Return precautions and treatment recommendations and follow-up discussed with the patient who is agreeable with the plan.    Delman Kitten, MD 08/22/16 [phone]

## 2016-08-22 NOTE — Discharge Instructions (Signed)
Follow-up at 9AM with Dr. Marland KitchenB" your oncologist or Dr. Sherrine Maples at the [facility] Office at [facility], they will be expecting to see you, and also Dr. Lovie Macadamia at 10 AM.  We believe that your symptoms are caused today by an exacerbation of your COPD, and possibly bronchitis.  Please take the prescribed medications and any medications that you have at home for your COPD.  Follow up with your doctor as recommended.  If you develop any new or worsening symptoms, including but not limited to fever, persistent vomiting, worsening shortness of breath, or other symptoms that concern you, please return to the Emergency Department immediately.

## 2016-08-22 NOTE — Progress Notes (Signed)
[facility] FOLLOW-UP progress notes  Patient Care Team: [name], MD as PCP - General (Family Medicine)  CHIEF COMPLAINTS/PURPOSE OF VISIT:  Small lymphocytic lymphoma, on treatment, for toxicity reviewed  HISTORY OF PRESENTING ILLNESS:  Craig [name]. 65 y.o. male is seen today because his primary oncologist is not available I reviewed the patient's records extensive and collaborated the history with the patient. Summary of his history is as follows: Oncology History   1. Right upper lobe lung mass biopsies positive for adenocarcinoma; PET- T2, N2, M0;  stage IIIA;  carboplatinum Alimta and Avastin;  [april  9th of 2013]  Patient had an allergic reaction to carboplatinum with acute bronchospasm in May of 2013; carbo-discontinued.; Maintenance Alimta and Avastin  from June of 2013 hold chemotherapy because of increasing shortness of breath (in July, 2013); VARISTRAT-GOOD; Started on [place] February 3 about 2014; Diarrhea 4 days after Tarceva was started.  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   # SEP 2017- GAZYVA x1 [stopped sec to respi issues]  # RIGHT KIDNEY CA- RCC [s/p Bx]- s/p Cryo [Dr.Yamagat; IR; 2016]   #  Poor pulmonary function; 3 L home O2.     Lung cancer, upper lobe ([place])   02/03/2015 Initial Diagnosis    Lung cancer, upper lobe       Lymphoma, small lymphocytic ([place])   02/03/2015 Initial Diagnosis    Lymphoma, small lymphocytic ([facility])      Cancer of lung ([place])   12/21/2015 Initial Diagnosis    Cancer of lung ([place])      Primary cancer of right upper lobe of lung ([place])   06/01/2016 Initial Diagnosis    Primary cancer of right upper lobe of lung ([place])     This patient was recently started on Ibrutinib for SLL He started taking his medications 2 days ago So far, he tolerated treatment well without major side effects He has chronic cough and shortness of breath due to COPD. He is currently on oxygen therapy, regular inhalers and prednisone He denies worsening fatigue, recent diarrhea, increased bruising or irregular heart rate/palpitation Of note, he was seen in the emergency department yesterday due to dizziness and increasing wheezing/shortness of breath CT head was unremarkable and chest x-ray showed no evidence of pneumonia or pulmonary infiltrate He feels well today He takes hydrocodone on a regular basis for cancer associated pain. He is due for medication refills today  MEDICAL HISTORY:  Past Medical History:  Diagnosis Date  . Asthma   . Cancer ([place])   . Colon cancer ([place])   . COPD (chronic obstructive pulmonary disease) ([place])   . Diabetes mellitus without complication ([place])   . Difficulty sleeping   . Dysrhythmia   . GERD (gastroesophageal reflux disease)   . Glaucoma   . History of bronchitis   . Hypertension   . Lung cancer ([place])    right upper lobe mass positive for adenocarcinoma  . Lymphoma ([facility])    low grade  . Renal cancer ([place])   . Respiratory failure ([place]) 07/12/2010   acute  . Right  renal mass   . Shortness of breath dyspnea   . Stroke ([place]) 1990'S   NO RESIDUAL PROBLEMS  . Supplemental oxygen dependent    USES 3 LITERS CONTINUOUSLY    SURGICAL HISTORY: History reviewed. No pertinent surgical history.  SOCIAL HISTORY: Social History   Social History  . Marital status: Single    Spouse name: N/A  . Number of children: N/A  . Years of education: N/A   Occupational History  . Not on file.   Social History Main Topics  . Smoking status: Former Smoker    Packs/day: 1.00    Years: 30.00    Types: Cigarettes     Start date: 12/28/1968    Quit date: 01/13/2010  . Smokeless tobacco: Never Used  . Alcohol use No     Comment: stopped 2002   . Drug use: No  . Sexual activity: Not on file   Other Topics Concern  . Not on file   Social History Narrative  . No narrative on file    FAMILY HISTORY: Family History  Problem Relation Age of Onset  . Diabetes Mellitus II Mother   . Hypertension Mother   . Cancer Mother   . Heart disease Father   . Hypertension Father   . Tuberculosis Father   . Diabetes Mellitus II Sister   . Cancer Sister   . Prostate cancer Brother     ALLERGIES:  is allergic to iodinated diagnostic agents and nexium [esomeprazole magnesium].  MEDICATIONS:  Current Outpatient Prescriptions  Medication Sig Dispense Refill  . albuterol (PROVENTIL HFA;VENTOLIN HFA) 108 (90 BASE) MCG/ACT inhaler Inhale 2 puffs into the lungs every 6 (six) hours as needed for wheezing or shortness of breath.     Marland Kitchen albuterol (PROVENTIL) (2.5 MG/3ML) 0.083% nebulizer solution USE 1 VIAL IN NEBULIZER EVERY 3-4 HOURS AS NEEDED 75 mL 3  . budesonide-formoterol (SYMBICORT) 160-4.5 MCG/ACT inhaler Inhale 2 puffs into the lungs 2 (two) times daily.     Marland Kitchen dextromethorphan (DELSYM) 30 MG/5ML liquid Take 5 mLs (30 mg total) by mouth 2 (two) times daily. 89 mL 0  . diphenhydrAMINE (BENADRYL) 25 MG tablet Take 2 tablets (50 mg total) by mouth as directed. (Patient taking differently: Take 25 mg by mouth as needed for itching or allergies. ) 2 tablet 0  . glipiZIDE (GLUCOTROL) 5 MG tablet Take 5 mg by mouth daily before breakfast.     . HYDROcodone-acetaminophen (NORCO/VICODIN) 5-325 MG tablet Take 1-2 tablets by mouth every 6 (six) hours as needed for moderate pain. Please Note new directions 120 tablet 0  . ibrutinib (IMBRUVICA) 140 MG capsul Take 3 capsules (420 mg total) by mouth daily. 90 capsule 3  . loperamide (IMODIUM A-D) 2 MG tablet Take 2 mg by mouth 4 (four) times daily as needed for diarrhea or loose  stools. Reported on 12/21/2015    . losartan-hydrochlorothiazide (HYZAAR) 50-12.5 MG per tablet Take 1 tablet by mouth daily.    . pioglitazone (ACTOS) 30 MG tablet Take 30 mg by mouth daily.    . predniSONE (DELTASONE) 10 MG tablet TAKE 1 TABLET (10 MG TOTAL) BY MOUTH DAILY WITH BREAKFAST. 30 tablet 3  . predniSONE (DELTASONE) 20 MG tablet Take 2 tablets (40 mg total) by mouth daily with breakfast. 10 tablet 0  . temazepam (RESTORIL) 7.5 MG capsule Take 15 mg by mouth at bedtime as needed for sleep. Reported on 12/21/2015  2  . theophylline (UNIPHYL) 400 MG 24 hr tablet Take 400 mg  by mouth daily as needed (For breathing.).     Marland Kitchen tiotropium (SPIRIVA) 18 MCG inhalation capsule Place 18 mcg into inhaler and inhale daily.     . temazepam (RESTORIL) 15 MG capsule 15 mg.     No current facility-administered medications for this visit.    Facility-Administered Medications Ordered in Other Visits  Medication Dose Route Frequency Provider Last Rate Last Dose  . sodium chloride 0.9 % injection 10 mL  10 mL Intracatheter PRN Forest Gleason, MD   10 mL at 04/28/15 1410  . sodium chloride 0.9 % injection 10 mL  10 mL Intravenous PRN Forest Gleason, MD   10 mL at 05/12/15 1350    REVIEW OF SYSTEMS:   Constitutional: Denies fevers, chills or abnormal night sweats Eyes: Denies blurriness of vision, double vision or watery eyes Ears, nose, mouth, throat, and face: Denies mucositis or sore throat Cardiovascular: Denies palpitation, chest discomfort or lower extremity swelling Gastrointestinal:  Denies nausea, heartburn or change in bowel habits Skin: Denies abnormal skin rashes Lymphatics: Denies new lymphadenopathy or easy bruising Neurological:Denies numbness, tingling or new weaknesses Behavioral/Psych: Mood is stable, no new changes  All other systems were reviewed with the patient and are negative.  PHYSICAL EXAMINATION: ECOG PERFORMANCE STATUS: 2 - Symptomatic, <50% confined to bed  Vitals:    08/22/16 0949  BP: 136/72  Pulse: (!) 109  Resp: 20  Temp: 98.6 F (37 C)   Filed Weights   08/22/16 0949  Weight: 144 lb 1.1 oz (65.4 kg)    GENERAL:alert, no distress and comfortable. He looks frail, sitting on a wheelchair with oxygen delivered via nasal cannula SKIN: skin color, texture, turgor are normal, no rashes or significant lesions EYES: normal, conjunctiva are pink and non-injected, sclera clear OROPHARYNX:no exudate, normal lips, buccal mucosa, and tongue  NECK: supple, thyroid normal size, non-tender, without nodularity LYMPH:  no palpable lymphadenopathy in the cervical, axillary or inguinal LUNGS: Mild increased work of breathing with mild scattered diffuse wheezes HEART: Tachycardia but with regular rhythm rhythm and no murmurs without lower extremity edema ABDOMEN:abdomen soft, non-tender and normal bowel sounds Musculoskeletal:no cyanosis of digits and no clubbing  PSYCH: alert & oriented x 3 with fluent speech NEURO: no focal motor/sensory deficits  LABORATORY DATA:  I have reviewed the data as listed Lab Results  Component Value Date   WBC 5.2 08/21/2016   HGB 9.8 (L) 08/21/2016   HCT 29.9 (L) 08/21/2016   MCV 83.6 08/21/2016   PLT 171 08/21/2016    Recent Labs  06/28/16 1455 07/02/16 1146  07/09/16 0904 08/06/16 0955 08/21/16 2131  NA 135 137  < > 136 136 139  K 4.1 4.9  < > 4.6 4.5 4.8  CL 90* 95*  < > 93* 93* 93*  CO2 36* 34*  < > 37* 33* 39*  GLUCOSE 161* 159*  < > 279* 144* 218*  BUN 16 20  < > 22* 18 20  CREATININE 1.08 1.17  < > 1.23 1.14 1.25*  CALCIUM 9.5 8.9  < > 8.9 9.0 8.7*  GFRNONAA >60 >60  < > 60* >60 59*  GFRAA >60 >60  < > >60 >60 >60  PROT 7.8 7.2  --   --  7.1  --   ALBUMIN 4.4 3.7  --   --  4.0  --   AST 17 23  --   --  18  --   ALT 12* 12*  --   --  13*  --  ALKPHOS 78 75  --   --  87  --   BILITOT 0.6 0.5  --   --  0.7  --   < > = values in this interval not displayed.  RADIOGRAPHIC STUDIES: I have personally  reviewed the radiological images as listed and agreed with the findings in the report. Dg Chest 2 View  Result Date: 08/22/2016 CLINICAL DATA:  Initial evaluation for acute chest pain/chest discomfort, subjective fever. History of lung cancer, on chemotherapy. EXAM: CHEST  2 VIEW COMPARISON:  Prior radiograph from 07/02/2016. FINDINGS: Left-sided Port-A-Cath in place with tip overlying the distal SVC, stable. Cardiac and mediastinal silhouettes are unchanged. Lungs are hyperinflated with underlying emphysematous changes. No focal infiltrates identified. No pulmonary edema or pleural effusion. No pneumothorax. No acute osseous abnormality. Multiple remotely healed left-sided rib fractures noted. IMPRESSION: 1. No active cardiopulmonary disease. 2. Emphysema. Electronically Signed   By: Jeannine Boga M.D.   On: 08/22/2016 00:05   Ct Head Wo Contrast  Result Date: 08/22/2016 CLINICAL DATA:  Lightheadedness. EXAM: CT HEAD WITHOUT CONTRAST TECHNIQUE: Contiguous axial images were obtained from the base of the skull through the vertex without intravenous contrast. COMPARISON:  Head CT 05/23/2006 FINDINGS: Brain: No mass lesion, intraparenchymal hemorrhage or extra-axial collection. No evidence of acute cortical infarct. Brain parenchyma and CSF-containing spaces are normal for age. Vascular: No hyperdense vessel or unexpected calcification. Skull: Normal visualized skull base and calvarium. Indeterminate hyperdense focus at the right parietal scalp. Sinuses/Orbits: No sinus fluid levels or advanced mucosal thickening. No mastoid effusion. Normal orbits. IMPRESSION: Normal brain for age. Electronically Signed   By: Ulyses Jarred M.D.   On: 08/22/2016 00:06    ASSESSMENT & PLAN:  Lymphoma, small lymphocytic ([place]) The patient started taking Ibrutinib on 08/20/2016 So far, he denies major side effects such as worsening fatigue, increased bruising, diarrhea or palpitations/irregular heart rate His blood  work today show stable blood counts I recommend we continue to same dose without dosage adjustment and he will return to see his primary oncologist in 2 weeks  Cancer associated pain The patient takes hydrocodone for chest wall pain/back pain I refill his prescriptions today and warned him about risk of side effects including nausea and constipation  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) ([place]) The patient has diagnosis of lung cancer and has COPD, currently oxygen dependent He will continue oxygen therapy and inhalers as directed He is still on prednisone therapy and clinical exam did not show significant signs of COPD exacerbation  Tachycardia The cause of his tachycardia is multi-factorial likely combination from anemia, COPD and others The heart rhythm is regular and he is not in atrial fibrillation His vital signs including blood pressure, temperature and respiratory rate were within acceptable normal limits Continue close observation for now  Anemia in neoplastic disease This is likely anemia of chronic disease. The patient denies recent history of bleeding such as epistaxis, hematuria or hematochezia. He is asymptomatic from the anemia. We will observe for now.  He does not require transfusion now.     No orders of the defined types were placed in this encounter.   All questions were answered. The patient knows to call the clinic with any problems, questions or concerns. I spent 20 minutes counseling the patient face to face. The total time spent in the appointment was 25 minutes and more than 50% was on counseling.     Heath Lark, MD 08/22/2016 11:07 AM

## 2016-08-22 NOTE — Assessment & Plan Note (Signed)
The patient started taking Ibrutinib on 08/20/2016 So far, he denies major side effects such as worsening fatigue, increased bruising, diarrhea or palpitations/irregular heart rate His blood work today show stable blood counts I recommend we continue to same dose without dosage adjustment and he will return to see his primary oncologist in 2 weeks

## 2016-08-22 NOTE — Assessment & Plan Note (Signed)
This is likely anemia of chronic disease. The patient denies recent history of bleeding such as epistaxis, hematuria or hematochezia. He is asymptomatic from the anemia. We will observe for now.  He does not require transfusion now.   

## 2016-08-22 NOTE — Assessment & Plan Note (Signed)
The patient has diagnosis of lung cancer and has COPD, currently oxygen dependent He will continue oxygen therapy and inhalers as directed He is still on prednisone therapy and clinical exam did not show significant signs of COPD exacerbation

## 2016-09-03 ENCOUNTER — Inpatient Hospital Stay: Payer: Commercial Managed Care - HMO

## 2016-09-03 ENCOUNTER — Inpatient Hospital Stay (HOSPITAL_BASED_OUTPATIENT_CLINIC_OR_DEPARTMENT_OTHER): Payer: Commercial Managed Care - HMO | Admitting: Internal Medicine

## 2016-09-03 VITALS — BP 113/75 | HR 109 | Temp 97.5°F | Resp 20 | Wt 147.0 lb

## 2016-09-03 DIAGNOSIS — C641 Malignant neoplasm of right kidney, except renal pelvis: Secondary | ICD-10-CM | POA: Diagnosis not present

## 2016-09-03 DIAGNOSIS — Z87891 Personal history of nicotine dependence: Secondary | ICD-10-CM

## 2016-09-03 DIAGNOSIS — C3410 Malignant neoplasm of upper lobe, unspecified bronchus or lung: Secondary | ICD-10-CM

## 2016-09-03 DIAGNOSIS — E119 Type 2 diabetes mellitus without complications: Secondary | ICD-10-CM

## 2016-09-03 DIAGNOSIS — Z8042 Family history of malignant neoplasm of prostate: Secondary | ICD-10-CM

## 2016-09-03 DIAGNOSIS — R000 Tachycardia, unspecified: Secondary | ICD-10-CM | POA: Diagnosis not present

## 2016-09-03 DIAGNOSIS — C8300 Small cell B-cell lymphoma, unspecified site: Secondary | ICD-10-CM

## 2016-09-03 DIAGNOSIS — D649 Anemia, unspecified: Secondary | ICD-10-CM

## 2016-09-03 DIAGNOSIS — G893 Neoplasm related pain (acute) (chronic): Secondary | ICD-10-CM | POA: Diagnosis not present

## 2016-09-03 DIAGNOSIS — Z9981 Dependence on supplemental oxygen: Secondary | ICD-10-CM

## 2016-09-03 DIAGNOSIS — R42 Dizziness and giddiness: Secondary | ICD-10-CM | POA: Diagnosis not present

## 2016-09-03 DIAGNOSIS — C3411 Malignant neoplasm of upper lobe, right bronchus or lung: Secondary | ICD-10-CM | POA: Diagnosis not present

## 2016-09-03 DIAGNOSIS — Z7984 Long term (current) use of oral hypoglycemic drugs: Secondary | ICD-10-CM

## 2016-09-03 DIAGNOSIS — Z9221 Personal history of antineoplastic chemotherapy: Secondary | ICD-10-CM

## 2016-09-03 DIAGNOSIS — Z7952 Long term (current) use of systemic steroids: Secondary | ICD-10-CM

## 2016-09-03 DIAGNOSIS — Z7951 Long term (current) use of inhaled steroids: Secondary | ICD-10-CM

## 2016-09-03 DIAGNOSIS — I10 Essential (primary) hypertension: Secondary | ICD-10-CM

## 2016-09-03 DIAGNOSIS — Z79899 Other long term (current) drug therapy: Secondary | ICD-10-CM

## 2016-09-03 DIAGNOSIS — Z809 Family history of malignant neoplasm, unspecified: Secondary | ICD-10-CM

## 2016-09-03 DIAGNOSIS — C3492 Malignant neoplasm of unspecified part of left bronchus or lung: Secondary | ICD-10-CM

## 2016-09-03 DIAGNOSIS — J449 Chronic obstructive pulmonary disease, unspecified: Secondary | ICD-10-CM

## 2016-09-03 DIAGNOSIS — I499 Cardiac arrhythmia, unspecified: Secondary | ICD-10-CM

## 2016-09-03 DIAGNOSIS — Z85038 Personal history of other malignant neoplasm of large intestine: Secondary | ICD-10-CM

## 2016-09-03 DIAGNOSIS — K219 Gastro-esophageal reflux disease without esophagitis: Secondary | ICD-10-CM

## 2016-09-03 LAB — COMPREHENSIVE METABOLIC PANEL
ALBUMIN: 3.9 g/dL (ref 3.5–5.0)
ALT: 12 U/L — AB (ref 17–63)
ANION GAP: 6 (ref 5–15)
AST: 15 U/L (ref 15–41)
Alkaline Phosphatase: 71 U/L (ref 38–126)
BUN: 22 mg/dL — ABNORMAL HIGH (ref 6–20)
CHLORIDE: 94 mmol/L — AB (ref 101–111)
CO2: 39 mmol/L — AB (ref 22–32)
CREATININE: 1.14 mg/dL (ref 0.61–1.24)
Calcium: 9 mg/dL (ref 8.9–10.3)
GFR calc non Af Amer: >60 mL/min (ref >60)
GLUCOSE: 99 mg/dL (ref 65–99)
Potassium: 4 mmol/L (ref 3.5–5.1)
SODIUM: 139 mmol/L (ref 135–145)
Total Bilirubin: 0.5 mg/dL (ref 0.3–1.2)
Total Protein: 7 g/dL (ref 6.5–8.1)

## 2016-09-03 LAB — CBC WITH DIFFERENTIAL/PLATELET
BASOS PCT: 1 %
Basophils Absolute: 0 10*3/uL (ref 0–0.1)
EOS ABS: 0.7 10*3/uL (ref 0–0.7)
EOS PCT: 8 %
HCT: 31.9 % — ABNORMAL LOW (ref 40.0–52.0)
Hemoglobin: 10.2 g/dL — ABNORMAL LOW (ref 13.0–18.0)
LYMPHS ABS: 2 10*3/uL (ref 1.0–3.6)
Lymphocytes Relative: 22 %
MCH: 27 pg (ref 26.0–34.0)
MCHC: 31.9 g/dL — ABNORMAL LOW (ref 32.0–36.0)
MCV: 84.6 fL (ref 80.0–100.0)
Monocytes Absolute: 1.2 10*3/uL — ABNORMAL HIGH (ref 0.2–1.0)
Monocytes Relative: 13 %
NEUTROS PCT: 56 %
Neutro Abs: 5.3 10*3/uL (ref 1.4–6.5)
PLATELETS: 171 10*3/uL (ref 150–440)
RBC: 3.77 MIL/uL — AB (ref 4.40–5.90)
RDW: 17 % — ABNORMAL HIGH (ref 11.5–14.5)
WBC: 9.3 10*3/uL (ref 3.8–10.6)

## 2016-09-03 LAB — LACTATE DEHYDROGENASE: LDH: 155 U/L (ref 98–192)

## 2016-09-03 MED ORDER — HYDROCODONE-ACETAMINOPHEN 5-325 MG PO TABS: 1.0000 | ORAL_TABLET | Freq: Four times a day (QID) | ORAL | 0 refills | Status: DC | PRN

## 2016-09-03 NOTE — Progress Notes (Signed)
Patient is here for follow up, needs refill on pain meds

## 2016-09-03 NOTE — Progress Notes (Signed)
[place] OFFICE PROGRESS NOTE  Patient Care Team: [name], MD as PCP - General (Family Medicine)  Lung cancer, upper lobe [facility])   Staging form: Lung, AJCC 7th Edition   - Clinical stage from 06/15/2010: yT2, N2, M0 - Signed by [name], NP on 02/03/2015   - Pathologic stage from 04/04/2014: yT2, N2, M1 - Signed by [name], NP on 02/03/2015  Lymphoma, small lymphocytic ([place])   Staging form: Lymphoid Neoplasms, AJCC 6th Edition   - Clinical stage from 10/15/2013: Stage II - Signed by [name], NP on 02/03/2015  Renal cell cancer [facility])   Staging form: Kidney, AJCC 7th Edition   - Clinical stage from 02/03/2015: Stage I (yT1a, N0, M0) - Signed by [name], NP on 02/03/2015   Oncology History   1. Right upper lobe lung mass biopsies positive for adenocarcinoma; PET- T2, N2, M0;  stage IIIA;  carboplatinum Alimta and Avastin;  [april  9th of 2013]  Patient had an allergic reaction to carboplatinum with acute bronchospasm in May of 2013; carbo-discontinued.; Maintenance Alimta and Avastin  from June of 2013 hold chemotherapy because of increasing shortness of breath (in July, 2013); VARISTRAT-GOOD; Started on [place] February 3 about 2014; Diarrhea 4 days after Tarceva was started.  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   # SEP 2017- GAZYVA x1 [stopped sec to respi issues]  # Nov 1st week- START Ibrutinib 2 pills day.   # RIGHT KIDNEY CA- RCC [s/p Bx]- s/p Cryo [Dr.Yamagat; IR; 2016]   #  Poor pulmonary function; 3 L home O2.     Lung cancer, upper lobe ([place])   02/03/2015  Initial Diagnosis    Lung cancer, upper lobe       Lymphoma, small lymphocytic ([place])   02/03/2015 Initial Diagnosis    Lymphoma, small lymphocytic ([facility])      Cancer of lung ([place])   12/21/2015 Initial Diagnosis    Cancer of lung ([place])      Primary cancer of right upper lobe of lung ([place])   06/01/2016 Initial Diagnosis    Primary cancer of right upper lobe of lung ([place])       INTERVAL HISTORY:  Craig [name]. 65 y.o.  male [place] patientHistory of chronic respiratory failure on home O2 with above history ofMultiple malignancies- lung with recurrence- treatment currently on hold; also kidney cancer status post cryoablation and also SLL/CLL-Treatments currently on ibrutinib 2 pills/day since Nov 1st week.  Patient continues to have pain/ chronic chest wall and also abdominal pain for which he is on narcotic pain medication. He denies any weight loss. Denies any night sweats. His chronic shortness of breath on 3 L of oxygen; Currently breathing is at baseline.  REVIEW OF SYSTEMS:  A complete 10 point review of system is done which is negative except mentioned above/history of present illness.   PAST MEDICAL HISTORY :  Past Medical History:  Diagnosis Date  . Asthma   . Cancer ([place])   . Colon cancer ([place])   . COPD (chronic obstructive pulmonary  disease) ([place])   . Diabetes mellitus without complication ([place])   . Difficulty sleeping   . Dysrhythmia   . GERD (gastroesophageal reflux disease)   . Glaucoma   . History of bronchitis   . Hypertension   . Lung cancer ([place])    right upper lobe mass positive for adenocarcinoma  . Lymphoma ([facility])    low grade  . Renal cancer ([place])   . Respiratory failure ([place]) 07/12/2010   acute  . Right renal mass   . Shortness of breath dyspnea   . Stroke ([place]) 1990'S   NO RESIDUAL PROBLEMS  . Supplemental oxygen dependent    USES 3 LITERS CONTINUOUSLY    PAST SURGICAL HISTORY :  No past surgical history on file.  FAMILY HISTORY :    Family History  Problem Relation Age of Onset  . Diabetes Mellitus II Mother   . Hypertension Mother   . Cancer Mother   . Heart disease Father   . Hypertension Father   . Tuberculosis Father   . Diabetes Mellitus II Sister   . Cancer Sister   . Prostate cancer Brother     SOCIAL HISTORY:   Social History  Substance Use Topics  . Smoking status: Former Smoker    Packs/day: 1.00    Years: 30.00    Types: Cigarettes    Start date: 12/28/1968    Quit date: 01/13/2010  . Smokeless tobacco: Never Used  . Alcohol use No     Comment: stopped 2002     ALLERGIES:  is allergic to iodinated diagnostic agents and nexium [esomeprazole magnesium].  MEDICATIONS:  Current Outpatient Prescriptions  Medication Sig Dispense Refill  . albuterol (PROVENTIL HFA;VENTOLIN HFA) 108 (90 BASE) MCG/ACT inhaler Inhale 2 puffs into the lungs every 6 (six) hours as needed for wheezing or shortness of breath.     Marland Kitchen albuterol (PROVENTIL) (2.5 MG/3ML) 0.083% nebulizer solution USE 1 VIAL IN NEBULIZER EVERY 3-4 HOURS AS NEEDED 75 mL 3  . budesonide-formoterol (SYMBICORT) 160-4.5 MCG/ACT inhaler Inhale 2 puffs into the lungs 2 (two) times daily.     Marland Kitchen dextromethorphan (DELSYM) 30 MG/5ML liquid Take 5 mLs (30 mg total) by mouth 2 (two) times daily. 89 mL 0  . diphenhydrAMINE (BENADRYL) 25 MG tablet Take 2 tablets (50 mg total) by mouth as directed. (Patient taking differently: Take 25 mg by mouth as needed for itching or allergies. ) 2 tablet 0  . glipiZIDE (GLUCOTROL) 5 MG tablet Take 5 mg by mouth daily before breakfast.     . HYDROcodone-acetaminophen (NORCO/VICODIN) 5-325 MG tablet Take 1 tablet by mouth every 6 (six) hours as needed for moderate pain. Please Note new directions 120 tablet 0  . ibrutinib (IMBRUVICA) 140 MG capsul Take 3 capsules (420 mg total) by mouth daily. 90 capsule 3  . loperamide (IMODIUM A-D) 2 MG tablet Take 2 mg by mouth 4 (four) times daily as needed for diarrhea or loose stools.  Reported on 12/21/2015    . losartan-hydrochlorothiazide (HYZAAR) 50-12.5 MG per tablet Take 1 tablet by mouth daily.    . pioglitazone (ACTOS) 30 MG tablet Take 30 mg by mouth daily.    . predniSONE (DELTASONE) 10 MG tablet TAKE 1 TABLET (10 MG TOTAL) BY MOUTH DAILY WITH BREAKFAST. 30 tablet 3  . predniSONE (DELTASONE) 20 MG tablet Take 2 tablets (40 mg total) by mouth daily with breakfast. 10 tablet 0  . temazepam (RESTORIL) 15 MG capsule 15 mg.    . temazepam (RESTORIL)  7.5 MG capsule Take 15 mg by mouth at bedtime as needed for sleep. Reported on 12/21/2015  2  . theophylline (UNIPHYL) 400 MG 24 hr tablet Take 400 mg by mouth daily as needed (For breathing.).     Marland Kitchen tiotropium (SPIRIVA) 18 MCG inhalation capsule Place 18 mcg into inhaler and inhale daily.      No current facility-administered medications for this visit.    Facility-Administered Medications Ordered in Other Visits  Medication Dose Route Frequency Provider Last Rate Last Dose  . sodium chloride 0.9 % injection 10 mL  10 mL Intracatheter PRN Forest Gleason, MD   10 mL at 04/28/15 1410  . sodium chloride 0.9 % injection 10 mL  10 mL Intravenous PRN Forest Gleason, MD   10 mL at 05/12/15 1350    PHYSICAL EXAMINATION: ECOG PERFORMANCE STATUS: 2 - Symptomatic, <50% confined to bed  BP 113/75 (BP Location: Left Arm, Patient Position: Sitting)   Pulse (!) 109   Temp 97.5 F (36.4 C)   Resp 20   Wt 147 lb (66.7 kg)   BMI 22.35 kg/m   Filed Weights   09/03/16 1220  Weight: 147 lb (66.7 kg)    GENERAL: Well-nourished well-developed; Alert, no distress and comfortable.   Alone. He is on home oxygen. He is in a wheelchair.  EYES: no pallor or icterus OROPHARYNX: no thrush or ulceration; good dentition  NECK: supple, no masses felt LYMPH:  no palpable lymphadenopathy in the cervical, axillary or inguinal regions LUNGS:Bilateral decreased air entry to auscultationn and  No wheeze or crackles HEART/CVS: regular rate & rhythm and  no murmurs; No lower extremity edema ABDOMEN:abdomen soft, non-tender and normal bowel sounds Musculoskeletal:no cyanosis of digits and no clubbing  PSYCH: alert & oriented x 3 with fluent speech NEURO: no focal motor/sensory deficits SKIN:  no rashes or significant lesions  LABORATORY DATA:  I have reviewed the data as listed    Component Value Date/Time   NA 139 09/03/2016 1143   NA 139 01/17/2015 0913   K 4.0 09/03/2016 1143   K 3.8 01/17/2015 0913   CL 94 (L) 09/03/2016 1143   CL 99 (L) 01/17/2015 0913   CO2 39 (H) 09/03/2016 1143   CO2 35 (H) 01/17/2015 0913   GLUCOSE 99 09/03/2016 1143   GLUCOSE 169 (H) 01/17/2015 0913   BUN 22 (H) 09/03/2016 1143   BUN 15 01/17/2015 0913   CREATININE 1.14 09/03/2016 1143   CREATININE 1.23 01/17/2015 0913   CALCIUM 9.0 09/03/2016 1143   CALCIUM 8.8 (L) 01/17/2015 0913   PROT 7.0 09/03/2016 1143   PROT 6.6 01/17/2015 0913   ALBUMIN 3.9 09/03/2016 1143   ALBUMIN 3.7 01/17/2015 0913   AST 15 09/03/2016 1143   AST 20 01/17/2015 0913   ALT 12 (L) 09/03/2016 1143   ALT 15 (L) 01/17/2015 0913   ALKPHOS 71 09/03/2016 1143   ALKPHOS 76 01/17/2015 0913   BILITOT 0.5 09/03/2016 1143   BILITOT 0.5 01/17/2015 0913   GFRNONAA >60 09/03/2016 1143   GFRNONAA >60 01/17/2015 0913   GFRAA >60 09/03/2016 1143   GFRAA >60 01/17/2015 0913    No results found for: SPEP, UPEP  Lab Results  Component Value Date   WBC 9.3 09/03/2016   NEUTROABS 5.3 09/03/2016   HGB 10.2 (L) 09/03/2016   HCT 31.9 (L) 09/03/2016   MCV 84.6 09/03/2016   PLT 171 09/03/2016      Chemistry      Component Value Date/Time  NA 139 09/03/2016 1143   NA 139 01/17/2015 0913   K 4.0 09/03/2016 1143   K 3.8 01/17/2015 0913   CL 94 (L) 09/03/2016 1143   CL 99 (L) 01/17/2015 0913   CO2 39 (H) 09/03/2016 1143   CO2 35 (H) 01/17/2015 0913   BUN 22 (H) 09/03/2016 1143   BUN 15 01/17/2015 0913   CREATININE 1.14 09/03/2016 1143   CREATININE 1.23 01/17/2015 0913       Component Value Date/Time   CALCIUM 9.0 09/03/2016 1143   CALCIUM 8.8 (L) 01/17/2015 0913   ALKPHOS 71 09/03/2016 1143   ALKPHOS 76 01/17/2015 0913   AST 15 09/03/2016 1143   AST 20 01/17/2015 0913   ALT 12 (L) 09/03/2016 1143   ALT 15 (L) 01/17/2015 0913   BILITOT 0.5 09/03/2016 1143   BILITOT 0.5 01/17/2015 0913       RADIOGRAPHIC STUDIES: I have personally reviewed the radiological images as listed and agreed with the findings in the report. No results found.   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 Hemoglobin approximately 9.6. Currently on Ibrutinib 2 pills/day. Tolerating well; no progression noted.   # Lung ca- history of local recurrence; currently on surveillance most recent imaging negative for any progression.  #  Kidney cancer status post cryoablation most recent imaging August 2017-no clinical evidence of progression.  # Pain- chest when coughing- continue pain meds prn. New script given.   # follow up with me in 8 weeks/labs.    Orders Placed This Encounter  Procedures  . CBC with Differential    Standing Status:   Future    Standing Expiration Date:   09/03/2017  . Comprehensive metabolic panel    Standing Status:   Future    Standing Expiration Date:   09/03/2017  . Lactate dehydrogenase    Standing Status:   Future    Standing Expiration Date:   09/03/2017      [name], MD 09/03/2016 12:44 PM

## 2016-09-03 NOTE — Assessment & Plan Note (Signed)
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 Hemoglobin approximately 9.6. Currently on Ibrutinib 2 pills/day. Tolerating well; no progression noted.   # Lung ca- history of local recurrence; currently on surveillance most recent imaging negative for any progression.  #  Kidney cancer status post cryoablation most recent imaging August 2017-no clinical evidence of progression.  # Pain- chest when coughing- continue pain meds prn. New script given.   # follow up with me in 8 weeks/labs.

## 2016-10-09 ENCOUNTER — Encounter: Payer: Self-pay | Admitting: *Deleted

## 2016-10-09 ENCOUNTER — Other Ambulatory Visit: Payer: Self-pay | Admitting: *Deleted

## 2016-10-09 DIAGNOSIS — C3492 Malignant neoplasm of unspecified part of left bronchus or lung: Secondary | ICD-10-CM

## 2016-10-09 DIAGNOSIS — C641 Malignant neoplasm of right kidney, except renal pelvis: Secondary | ICD-10-CM

## 2016-10-09 DIAGNOSIS — C8300 Small cell B-cell lymphoma, unspecified site: Secondary | ICD-10-CM

## 2016-10-09 MED ORDER — HYDROCODONE-ACETAMINOPHEN 5-325 MG PO TABS: 1.0000 | ORAL_TABLET | Freq: Four times a day (QID) | ORAL | 0 refills | Status: DC | PRN

## 2016-10-09 NOTE — Progress Notes (Signed)
Pt arrived to clinic today to complete the [employer] application for Imbruvica assistance.  Application and supporting financial documentation faxed to [employer].

## 2016-10-10 ENCOUNTER — Other Ambulatory Visit: Payer: Self-pay | Admitting: Internal Medicine

## 2016-10-18 ENCOUNTER — Encounter: Payer: Self-pay | Admitting: Interventional Radiology

## 2016-10-22 DIAGNOSIS — J439 Emphysema, unspecified: Secondary | ICD-10-CM | POA: Diagnosis not present

## 2016-10-22 DIAGNOSIS — R0609 Other forms of dyspnea: Secondary | ICD-10-CM | POA: Diagnosis not present

## 2016-10-22 DIAGNOSIS — C3491 Malignant neoplasm of unspecified part of right bronchus or lung: Secondary | ICD-10-CM | POA: Diagnosis not present

## 2016-10-22 DIAGNOSIS — R05 Cough: Secondary | ICD-10-CM | POA: Diagnosis not present

## 2016-10-26 ENCOUNTER — Telehealth: Payer: Self-pay

## 2016-10-26 NOTE — Telephone Encounter (Signed)
Informed patient that [place] patient assistance Foundation determined they are unable to provide him with Imbruvica due to him not meeting eligibility requirements.

## 2016-10-30 ENCOUNTER — Other Ambulatory Visit: Payer: Self-pay | Admitting: *Deleted

## 2016-10-30 ENCOUNTER — Telehealth: Payer: Self-pay | Admitting: *Deleted

## 2016-10-30 DIAGNOSIS — C8300 Small cell B-cell lymphoma, unspecified site: Secondary | ICD-10-CM

## 2016-10-30 MED ORDER — IBRUTINIB 140 MG PO CAPS: 420.0000 mg | ORAL_CAPSULE | Freq: Every day | ORAL | 6 refills | Status: DC

## 2016-10-30 NOTE — Telephone Encounter (Signed)
Per [employer]-unable to renew pt's financial assistance for this beginning of his year. Pt does not meet requirements.  Pt was made aware.

## 2016-10-30 NOTE — Progress Notes (Signed)
Per [employer]-unable to renew pt's financial assistance for this beginning of his year. Pt does not meet requirements.  I contacted [facility] - biologics will need a new rx faxed and sent to them. Pt has not had an rx script filled with [facility] since December. Pt may need to go through manufacturer of drug for assistance.

## 2016-11-02 NOTE — Telephone Encounter (Signed)
imbruvica New rx faxed to [facility] on Tuesday 10/30/16.  On 1/181/18-Stephanie at [employer] contacted cancer center. Patient unable to get funds through [employer]. He utilzed all grants at [employer]. Pt's copay for first month will be $2700. Requesting call back to discuss patient's financial resources.    RN Left msg this morning at 10am that call was being returned.

## 2016-11-05 ENCOUNTER — Inpatient Hospital Stay: Payer: Medicare HMO

## 2016-11-05 ENCOUNTER — Inpatient Hospital Stay: Payer: Medicare HMO | Attending: Internal Medicine | Admitting: Internal Medicine

## 2016-11-05 VITALS — BP 94/62 | HR 111 | Temp 97.0°F | Wt 144.0 lb

## 2016-11-05 DIAGNOSIS — Z8042 Family history of malignant neoplasm of prostate: Secondary | ICD-10-CM | POA: Diagnosis not present

## 2016-11-05 DIAGNOSIS — Z87891 Personal history of nicotine dependence: Secondary | ICD-10-CM | POA: Diagnosis not present

## 2016-11-05 DIAGNOSIS — K219 Gastro-esophageal reflux disease without esophagitis: Secondary | ICD-10-CM

## 2016-11-05 DIAGNOSIS — C3411 Malignant neoplasm of upper lobe, right bronchus or lung: Secondary | ICD-10-CM

## 2016-11-05 DIAGNOSIS — C641 Malignant neoplasm of right kidney, except renal pelvis: Secondary | ICD-10-CM

## 2016-11-05 DIAGNOSIS — J9610 Chronic respiratory failure, unspecified whether with hypoxia or hypercapnia: Secondary | ICD-10-CM

## 2016-11-05 DIAGNOSIS — N189 Chronic kidney disease, unspecified: Secondary | ICD-10-CM

## 2016-11-05 DIAGNOSIS — C8300 Small cell B-cell lymphoma, unspecified site: Secondary | ICD-10-CM

## 2016-11-05 DIAGNOSIS — Z809 Family history of malignant neoplasm, unspecified: Secondary | ICD-10-CM

## 2016-11-05 DIAGNOSIS — C9110 Chronic lymphocytic leukemia of B-cell type not having achieved remission: Secondary | ICD-10-CM

## 2016-11-05 DIAGNOSIS — Z85038 Personal history of other malignant neoplasm of large intestine: Secondary | ICD-10-CM | POA: Diagnosis not present

## 2016-11-05 DIAGNOSIS — J449 Chronic obstructive pulmonary disease, unspecified: Secondary | ICD-10-CM | POA: Diagnosis not present

## 2016-11-05 DIAGNOSIS — E119 Type 2 diabetes mellitus without complications: Secondary | ICD-10-CM

## 2016-11-05 DIAGNOSIS — I129 Hypertensive chronic kidney disease with stage 1 through stage 4 chronic kidney disease, or unspecified chronic kidney disease: Secondary | ICD-10-CM | POA: Diagnosis not present

## 2016-11-05 DIAGNOSIS — Z9981 Dependence on supplemental oxygen: Secondary | ICD-10-CM | POA: Diagnosis not present

## 2016-11-05 DIAGNOSIS — Z95828 Presence of other vascular implants and grafts: Secondary | ICD-10-CM | POA: Insufficient documentation

## 2016-11-05 DIAGNOSIS — Z79899 Other long term (current) drug therapy: Secondary | ICD-10-CM

## 2016-11-05 DIAGNOSIS — C3492 Malignant neoplasm of unspecified part of left bronchus or lung: Secondary | ICD-10-CM

## 2016-11-05 LAB — CBC WITH DIFFERENTIAL/PLATELET
BASOS PCT: 1 %
Basophils Absolute: 0.1 10*3/uL (ref 0–0.1)
EOS ABS: 0.3 10*3/uL (ref 0–0.7)
Eosinophils Relative: 3 %
HCT: 29.8 % — ABNORMAL LOW (ref 40.0–52.0)
Hemoglobin: 9.6 g/dL — ABNORMAL LOW (ref 13.0–18.0)
LYMPHS PCT: 38 %
Lymphs Abs: 3.3 10*3/uL (ref 1.0–3.6)
MCH: 27.7 pg (ref 26.0–34.0)
MCHC: 32.2 g/dL (ref 32.0–36.0)
MCV: 86.1 fL (ref 80.0–100.0)
MONO ABS: 0.4 10*3/uL (ref 0.2–1.0)
Monocytes Relative: 5 %
NEUTROS ABS: 4.5 10*3/uL (ref 1.4–6.5)
Neutrophils Relative %: 53 %
Platelets: 208 10*3/uL (ref 150–440)
RBC: 3.46 MIL/uL — ABNORMAL LOW (ref 4.40–5.90)
RDW: 14.9 % — ABNORMAL HIGH (ref 11.5–14.5)
WBC: 8.6 10*3/uL (ref 3.8–10.6)

## 2016-11-05 LAB — COMPREHENSIVE METABOLIC PANEL
ALT: 13 U/L — ABNORMAL LOW (ref 17–63)
ANION GAP: 7 (ref 5–15)
AST: 19 U/L (ref 15–41)
Albumin: 3.9 g/dL (ref 3.5–5.0)
Alkaline Phosphatase: 71 U/L (ref 38–126)
BUN: 30 mg/dL — ABNORMAL HIGH (ref 6–20)
CO2: 36 mmol/L — AB (ref 22–32)
Calcium: 8.9 mg/dL (ref 8.9–10.3)
Chloride: 92 mmol/L — ABNORMAL LOW (ref 101–111)
Creatinine, Ser: 1.28 mg/dL — ABNORMAL HIGH (ref 0.61–1.24)
GFR, EST NON AFRICAN AMERICAN: 57 mL/min — AB (ref >60)
Glucose, Bld: 171 mg/dL — ABNORMAL HIGH (ref 65–99)
Potassium: 4.5 mmol/L (ref 3.5–5.1)
SODIUM: 135 mmol/L (ref 135–145)
TOTAL PROTEIN: 7.2 g/dL (ref 6.5–8.1)
Total Bilirubin: 0.4 mg/dL (ref 0.3–1.2)

## 2016-11-05 LAB — LACTATE DEHYDROGENASE: LDH: 193 U/L — AB (ref 98–192)

## 2016-11-05 MED ORDER — ALTEPLASE 2 MG IJ SOLR: 2.0000 mg | Freq: Once | INTRAMUSCULAR | Status: DC | PRN

## 2016-11-05 MED ORDER — HYDROCODONE-ACETAMINOPHEN 5-325 MG PO TABS: 1.0000 | ORAL_TABLET | Freq: Four times a day (QID) | ORAL | 0 refills | Status: DC | PRN

## 2016-11-05 MED ORDER — SODIUM CHLORIDE 0.9% FLUSH
10.0000 mL | INTRAVENOUS | Status: DC | PRN
  Administered 2016-11-05: 10 mL via INTRAVENOUS
  Filled 2016-11-05: qty 10

## 2016-11-05 MED ORDER — HEPARIN SOD (PORK) LOCK FLUSH 100 UNIT/ML IV SOLN
500.0000 [IU] | Freq: Once | INTRAVENOUS | Status: AC | PRN
  Administered 2016-11-05: 500 [IU] via INTRAVENOUS

## 2016-11-05 MED ORDER — HEPARIN SOD (PORK) LOCK FLUSH 100 UNIT/ML IV SOLN
INTRAVENOUS | Status: AC
  Filled 2016-11-05: qty 5

## 2016-11-05 NOTE — Progress Notes (Signed)
Patient here today for follow up. Patient needs refill on pain medication and asking for port flush today, pt states that he took his last pill of ibrutinib (IMBRUVICA) 140 MG  Today.

## 2016-11-05 NOTE — Telephone Encounter (Addendum)
Spoke with stephanie at [employer] this morning. She stated that she was going to send an application to cancer center for [employer]. I explained to [employer] to pt did not get approved for funding for [employer].  Patient's out of pocket cost if $2700. Per [facility], if patient willing to pay $700 of that biologics, then [facility] could dispense a limited supply. Will ask pt.

## 2016-11-05 NOTE — Progress Notes (Signed)
[place] OFFICE PROGRESS NOTE  Patient Care Team: [name], MD as PCP - General (Family Medicine)  Cancer Staging Lung cancer, upper lobe [facility]) Staging form: Lung, AJCC 7th Edition - Clinical stage from 06/15/2010: yT2, N2, M0 - Signed by [name], NP on 02/03/2015 - Pathologic stage from 04/04/2014: yT2, N2, M1 - Signed by [name], NP on 02/03/2015  Lymphoma, small lymphocytic ([place]) Staging form: Lymphoid Neoplasms, AJCC 6th Edition - Clinical stage from 10/15/2013: Stage II - Signed by [name], NP on 02/03/2015  Renal cell cancer [facility]) Staging form: Kidney, AJCC 7th Edition - Clinical stage from 02/03/2015: Stage I (yT1a, N0, M0) - Signed by [name], NP on 02/03/2015    Oncology History   1. 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   # SEP 2017- GAZYVA x1 [stopped sec to respi issues]  # Nov 1st week- START Ibrutinib 2 pills day; Last Jan 22nd 2018 [sec to co-pay].   # RIGHT KIDNEY CA- RCC [s/p Bx]- s/p Cryo [Dr.Yamagat; IR; 2016]   #  Poor pulmonary function; 3 L home O2.     Lung  cancer, upper lobe ([place])   02/03/2015 Initial Diagnosis    Lung cancer, upper lobe       Lymphoma, small lymphocytic ([place])   02/03/2015 Initial Diagnosis    Lymphoma, small lymphocytic ([facility])      Cancer of lung ([place])   12/21/2015 Initial Diagnosis    Cancer of lung ([place])      Primary cancer of right upper lobe of lung ([place])   06/01/2016 Initial Diagnosis    Primary cancer of right upper lobe of lung ([place])       INTERVAL HISTORY:  Craig [name]. 66 y.o.  male [place] patientHistory of chronic respiratory failure on home O2 with above history ofMultiple malignancies- lung with recurrence- treatment currently on hold; also kidney cancer status post cryoablation and also SLL/CLL-Treatments currently on ibrutinib 2 pills/day since Nov 1st.   Patient ran out of his Assistance for his ibrutinib. He denies any lumps or bumps.  Patient continues to have pain/ chronic chest wall and also abdominal pain for which he is on narcotic pain medication. He denies any weight loss. Denies any night sweats. His chronic shortness of breath on 3 L of oxygen; Currently breathing is at baseline. Complains of chronic cough.  REVIEW OF SYSTEMS:  A complete 10 point review of system is done which is negative except mentioned above/history of present illness.   PAST MEDICAL HISTORY :  Past Medical History:  Diagnosis Date  . Asthma   .  Cancer ([place])   . Colon cancer ([place])   . COPD (chronic obstructive pulmonary disease) ([place])   . Diabetes mellitus without complication ([place])   . Difficulty sleeping   . Dysrhythmia   . GERD (gastroesophageal reflux disease)   . Glaucoma   . History of bronchitis   . Hypertension   . Lung cancer ([place])    right upper lobe mass positive for adenocarcinoma  . Lymphoma ([facility])    low grade  . Renal cancer ([place])   . Respiratory failure ([place]) 07/12/2010   acute  . Right renal mass   . Shortness of breath dyspnea   . Stroke ([employer]) 1990'S   NO RESIDUAL PROBLEMS  .  Supplemental oxygen dependent    USES 3 LITERS CONTINUOUSLY    PAST SURGICAL HISTORY :   Past Surgical History:  Procedure Laterality Date  . IR GENERIC HISTORICAL  05/31/2016   IR RADIOLOGIST EVAL & MGMT 05/31/2016 Aletta Edouard, MD [facility] INTERV RAD    FAMILY HISTORY :   Family History  Problem Relation Age of Onset  . Diabetes Mellitus II Mother   . Hypertension Mother   . Cancer Mother   . Heart disease Father   . Hypertension Father   . Tuberculosis Father   . Diabetes Mellitus II Sister   . Cancer Sister   . Prostate cancer Brother     SOCIAL HISTORY:   Social History  Substance Use Topics  . Smoking status: Former Smoker    Packs/day: 1.00    Years: 30.00    Types: Cigarettes    Start date: 12/28/1968    Quit date: 01/13/2010  . Smokeless tobacco: Never Used  . Alcohol use No     Comment: stopped 2002     ALLERGIES:  is allergic to iodinated diagnostic agents and nexium [esomeprazole magnesium].  MEDICATIONS:  Current Outpatient Prescriptions  Medication Sig Dispense Refill  . albuterol (PROVENTIL HFA;VENTOLIN HFA) 108 (90 BASE) MCG/ACT inhaler Inhale 2 puffs into the lungs every 6 (six) hours as needed for wheezing or shortness of breath.     Marland Kitchen albuterol (PROVENTIL) (2.5 MG/3ML) 0.083% nebulizer solution USE 1 VIAL IN NEBULIZER EVERY 3-4 HOURS AS NEEDED 75 mL 3  . budesonide-formoterol (SYMBICORT) 160-4.5 MCG/ACT inhaler Inhale 2 puffs into the lungs 2 (two) times daily.     . diphenhydrAMINE (BENADRYL) 25 MG tablet Take 2 tablets (50 mg total) by mouth as directed. (Patient taking differently: Take 25 mg by mouth as needed for itching or allergies. ) 2 tablet 0  . glipiZIDE (GLUCOTROL) 5 MG tablet Take 5 mg by mouth daily before breakfast.     . HYDROcodone-acetaminophen (NORCO/VICODIN) 5-325 MG tablet Take 1 tablet by mouth every 6 (six) hours as needed for moderate pain. Please Note new directions 120 tablet 0  . ibrutinib (IMBRUVICA) 140 MG capsul Take 3  capsules (420 mg total) by mouth daily. 90 capsule 6  . loperamide (IMODIUM A-D) 2 MG tablet Take 2 mg by mouth 4 (four) times daily as needed for diarrhea or loose stools. Reported on 12/21/2015    . losartan-hydrochlorothiazide (HYZAAR) 50-12.5 MG per tablet Take 1 tablet by mouth daily.    . pioglitazone (ACTOS) 30 MG tablet Take 30 mg by mouth daily.    . predniSONE (DELTASONE) 10 MG tablet TAKE 1 TABLET (10 MG TOTAL) BY MOUTH DAILY WITH BREAKFAST. 30 tablet 3  . temazepam (RESTORIL) 15 MG capsule Take 15 mg by mouth at bedtime as needed.     Marland Kitchen  tiotropium (SPIRIVA) 18 MCG inhalation capsule Place 18 mcg into inhaler and inhale daily.      No current facility-administered medications for this visit.    Facility-Administered Medications Ordered in Other Visits  Medication Dose Route Frequency Provider Last Rate Last Dose  . alteplase (CATHFLO ACTIVASE) injection 2 mg  2 mg Intracatheter Once PRN [name], MD      . sodium chloride 0.9 % injection 10 mL  10 mL Intracatheter PRN Forest Gleason, MD   10 mL at 04/28/15 1410  . sodium chloride 0.9 % injection 10 mL  10 mL Intravenous PRN Forest Gleason, MD   10 mL at 05/12/15 1350  . sodium chloride flush (NS) 0.9 % injection 10 mL  10 mL Intravenous PRN [name], MD   10 mL at 11/05/16 1230    PHYSICAL EXAMINATION: ECOG PERFORMANCE STATUS: 2 - Symptomatic, <50% confined to bed  BP 94/62 (BP Location: Left Arm, Patient Position: Sitting)   Pulse (!) 111   Temp 97 F (36.1 C) (Tympanic)   Wt 144 lb (65.3 kg)   BMI 21.90 kg/m   Filed Weights   11/05/16 1043  Weight: 144 lb (65.3 kg)    GENERAL: Well-nourished well-developed; Alert, no distress and comfortable.   Alone. He is on home oxygen. He is in a wheelchair.  EYES: no pallor or icterus OROPHARYNX: no thrush or ulceration; good dentition  NECK: supple, no masses felt LYMPH:  no palpable lymphadenopathy in the cervical, axillary or inguinal  regions LUNGS:Bilateral decreased air entry to auscultationn and  No wheeze or crackles HEART/CVS: regular rate & rhythm and no murmurs; No lower extremity edema ABDOMEN:abdomen soft, non-tender and normal bowel sounds Musculoskeletal:no cyanosis of digits and no clubbing  PSYCH: alert & oriented x 3 with fluent speech NEURO: no focal motor/sensory deficits SKIN:  no rashes or significant lesions  LABORATORY DATA:  I have reviewed the data as listed    Component Value Date/Time   NA 135 11/05/2016 1015   NA 139 01/17/2015 0913   K 4.5 11/05/2016 1015   K 3.8 01/17/2015 0913   CL 92 (L) 11/05/2016 1015   CL 99 (L) 01/17/2015 0913   CO2 36 (H) 11/05/2016 1015   CO2 35 (H) 01/17/2015 0913   GLUCOSE 171 (H) 11/05/2016 1015   GLUCOSE 169 (H) 01/17/2015 0913   BUN 30 (H) 11/05/2016 1015   BUN 15 01/17/2015 0913   CREATININE 1.28 (H) 11/05/2016 1015   CREATININE 1.23 01/17/2015 0913   CALCIUM 8.9 11/05/2016 1015   CALCIUM 8.8 (L) 01/17/2015 0913   PROT 7.2 11/05/2016 1015   PROT 6.6 01/17/2015 0913   ALBUMIN 3.9 11/05/2016 1015   ALBUMIN 3.7 01/17/2015 0913   AST 19 11/05/2016 1015   AST 20 01/17/2015 0913   ALT 13 (L) 11/05/2016 1015   ALT 15 (L) 01/17/2015 0913   ALKPHOS 71 11/05/2016 1015   ALKPHOS 76 01/17/2015 0913   BILITOT 0.4 11/05/2016 1015   BILITOT 0.5 01/17/2015 0913   GFRNONAA 57 (L) 11/05/2016 1015   GFRNONAA >60 01/17/2015 0913   GFRAA >60 11/05/2016 1015   GFRAA >60 01/17/2015 0913    No results found for: SPEP, UPEP  Lab Results  Component Value Date   WBC 8.6 11/05/2016   NEUTROABS 4.5 11/05/2016   HGB 9.6 (L) 11/05/2016   HCT 29.8 (L) 11/05/2016   MCV 86.1 11/05/2016   PLT 208 11/05/2016      Chemistry  Component Value Date/Time   NA 135 11/05/2016 1015   NA 139 01/17/2015 0913   K 4.5 11/05/2016 1015   K 3.8 01/17/2015 0913   CL 92 (L) 11/05/2016 1015   CL 99 (L) 01/17/2015 0913   CO2 36 (H) 11/05/2016 1015   CO2 35 (H) 01/17/2015  0913   BUN 30 (H) 11/05/2016 1015   BUN 15 01/17/2015 0913   CREATININE 1.28 (H) 11/05/2016 1015   CREATININE 1.23 01/17/2015 0913      Component Value Date/Time   CALCIUM 8.9 11/05/2016 1015   CALCIUM 8.8 (L) 01/17/2015 0913   ALKPHOS 71 11/05/2016 1015   ALKPHOS 76 01/17/2015 0913   AST 19 11/05/2016 1015   AST 20 01/17/2015 0913   ALT 13 (L) 11/05/2016 1015   ALT 15 (L) 01/17/2015 0913   BILITOT 0.4 11/05/2016 1015   BILITOT 0.5 01/17/2015 0913       RADIOGRAPHIC STUDIES: I have personally reviewed the radiological images as listed and agreed with the findings in the report. No results found.   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 Hemoglobin approximately 9.6. Currently on Ibrutinib 2 pills/day. Tolerating well; no progression noted; but last dose Jan 22nd sec to co-pay/insurance issues.   # discussed option of using R-Benda every q 4W. Discussed the potential side effects. Patient will need shingles prophylaxis. We will plan holding Riruxan 1-2 cycles to avoid potential infusion reactions. Hepatitis workup is negative. Or if patient has issues with R-Benda- next options would R-CVP.   # Growth factor-Neulasta/On pro would be given as prophylaxis for chemotherapy-induced neutropenia to prevent febrile neutropenias. Discussed potential side effect- myalgias/arthralgias.   # # Reviewed/counselled regarding the goals of care- being palliative/control the disease progresssoin with Goal is to maintain quality of life as the disease is incurable.  # Lung ca- history of local recurrence; currently on surveillance most recent imaging negative for any progression.  #  Kidney cancer status post cryoablation most recent imaging August 2017-no clinical evidence of progression.  # Pain- chest when coughing- continue pain meds prn. New script given.   #  Advanced COPD 3 L of oxygen- discussed the potential infusion reactions with Rituxan.  # CKD creatinine 1.2-1.3. This has to be monitored closely with bendamustine. Also cut the dose of bendamustine.  # follow up with me in 4 weeks/labs; Oneal Grout possible; if ibrutinib is not approved.    Orders Placed This Encounter  Procedures  . CBC with Differential    Standing Status:   Future    Standing Expiration Date:   11/05/2017  . Comprehensive metabolic panel    Standing Status:   Future    Standing Expiration Date:   11/05/2017  . Lactate dehydrogenase    Standing Status:   Future    Standing Expiration Date:   11/05/2017      [name], MD 11/05/2016 1:09 PM

## 2016-11-05 NOTE — Assessment & Plan Note (Addendum)
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 Hemoglobin approximately 9.6. Currently on Ibrutinib 2 pills/day. Tolerating well; no progression noted; but last dose Jan 22nd sec to co-pay/insurance issues.   # discussed option of using R-Benda every q 4W. Discussed the potential side effects. Patient will need shingles prophylaxis. We will plan holding Riruxan 1-2 cycles to avoid potential infusion reactions. Hepatitis workup is negative. Or if patient has issues with R-Benda- next options would R-CVP.   # Growth factor-Neulasta/On pro would be given as prophylaxis for chemotherapy-induced neutropenia to prevent febrile neutropenias. Discussed potential side effect- myalgias/arthralgias.   # # Reviewed/counselled regarding the goals of care- being palliative/control the disease progresssoin with Goal is to maintain quality of life as the disease is incurable.  # Lung ca- history of local recurrence; currently on surveillance most recent imaging negative for any progression.  #  Kidney cancer status post cryoablation most recent imaging August 2017-no clinical evidence of progression.  # Pain- chest when coughing- continue pain meds prn. New script given.   # Advanced COPD 3 L of oxygen- discussed the potential infusion reactions with Rituxan.  # CKD creatinine 1.2-1.3. This has to be monitored closely with bendamustine. Also cut the dose of bendamustine.  # follow up with me in 4 weeks/labs; Oneal Grout possible; if ibrutinib is not approved.

## 2016-11-05 NOTE — Telephone Encounter (Signed)
Spoke with marty at the leukemia/foundation - patient currently not eligible to reapply until Nov. 2018. He would only receive $2500 grant per year.  Pt aware. RN will investigate other alternatives to get imbruvica

## 2016-11-05 NOTE — Progress Notes (Signed)
DISCONTINUE ON PATHWAY REGIMEN - Lymphoma and CLL  [id number]: Ibrutinib 420 mg Daily Until Progression or Unacceptable Toxicity   Administer once daily:     Ibrutinib (Imbruvica(R)) 420 mg flat dose Give three '140mg'$  capsules orally once daily. Do not open, break, or chew the capsules.  Monthly CBC recommended. Dose Mod: None  **Always confirm dose/schedule in your pharmacy ordering system**    REASON: Other Reason PRIOR TREATMENT: [id number]: Ibrutinib 420 mg Daily Until Progression or Unacceptable Toxicity TREATMENT RESPONSE: Unable to Evaluate  START OFF PATHWAY REGIMEN - Lymphoma and CLL  Off Pathway: Bendamustine + Rituximab (90/375) q28 Days  [id number]:Bendamustine + Rituximab (90/375) q28 Days:   A cycle is every 28 days:     Bendamustine (Bendeka(TM)) 90 mg/m2 in 50 mL NS IV over 10 minutes days 1 and 2. Administer first Dose Mod: None     Rituximab (Rituxan(R)) 375 mg/m2 in _____ mL NS IV day 1 only.  Initiate first dose at a rate of 50 mg/hr.  In the absence of infusion toxicity, increase infusion rate by 50 mg/hr increments every 30 minutes, to a maximum of 400 mg/hr. If first dose  tolerated, may initiate subsequent infusions at an initial rate of 100 mg/hr.  In the absence of infusion toxicity, may increase rate by 100 mg/hr increments at 30-min intervals to a maximum of 400 mg/hr. For follicular and DLBC lymphoma patients see  "Rapid infusion protocol" link for more information about accelerating the infusion time of rituximab Dose Mod: None Additional Orders: Bendamustine use not recommended in patients with CrCl < 40 mL/min or with moderate (AST/ALT 2.5-10 xULN and total bili 1.5-3 xULN) or severe (total bili > 3 xULN) hepatic impairment. Hepatitis B&C testing recommended prior to  rituximab use on all patients. Final concentration of rituximab must be between 1 and 4 mg/mL.  **Always confirm dose/schedule in your pharmacy ordering system**    Patient  Characteristics: Small Lymphocytic Leukemia (SLL), Second Line, 17p del (-), Prior Ibrutinib Disease Type: Small Lymphocytic Leukemia (SLL) Disease Type: Not Applicable [place] Stage: IIIA Line of therapy: Second Line 17p Deletion Status: Negative Prior Treatment: Prior Ibrutinib  Intent of Therapy: Non-Curative / Palliative Intent, Discussed with Patient

## 2016-11-05 NOTE — Telephone Encounter (Signed)
Spoke with Colletta Maryland at [employer]. Stephanie sent an application to cancer center to help pt apply for pt assistance with [employer]. I explained that [employer]john[employer] sent a letter to cancer center that he did not meet the eligibility requirements for funding.  Colletta Maryland stated that she was not aware of this. She is able to help initiate fulfilling if pt is able to pay $700. I explained that patient most likely will not be able to afford this out of pocket cost. She recommended that Dr. Rogue Bussing write a letter to [employer] to see if it is possible to appeal johnson/johnson's denial based on this patients very low income and the fact he has been on Imbruvica.  She also provided me with the information for Nicki Reaper, [employer], with [employer]:   [place] [phone]  1030 am - I reached out to [place] and left a vm asking for direction and recommendations on a patient's case that has exhausted all funding available for Imbruvica. Explained that my patient is not able to get funding renewed from [employer].  I spoke with the patient in the clinic today. He is not able to afford a high copay.   53 am - scott returned my call,  He asked that I call Leukemia/lymphoma found back to determine if new funding has been made available.

## 2016-11-06 ENCOUNTER — Telehealth: Payer: Self-pay | Admitting: *Deleted

## 2016-11-06 NOTE — Telephone Encounter (Signed)
Spoke with patient. explore the you I support program. Unable to suppopatrt patient with drug assistance due to pt's health insurance coverage-[employer]. Was advised to have pt apply with pan program.  I contacted [phone]. Need pt's written or verbal consent to have pt apply first before I can advocate for pt. Pt can also apply himself -(hours of PAN 7 am to 7pm) by calling pan and then the md application portion will be faxed to cancer ctr.  I contacted pt. He will personally contact pan and provide his information. He thanked me for calling him with the update.

## 2016-11-15 ENCOUNTER — Telehealth: Payer: Self-pay | Admitting: *Deleted

## 2016-11-15 NOTE — Telephone Encounter (Signed)
Left msg for patient to see if he had applied for the [employer] foundation for his Imbruvica.

## 2016-11-16 NOTE — Telephone Encounter (Signed)
Pt return phone call. Per patient, [employer] foundation does not have any funding for the imbruvica.  RN spoke with Dr. Rogue Bussing- keep apt on schedule for 2/19 for rituxan/bendamustine.

## 2016-11-26 DIAGNOSIS — Z23 Encounter for immunization: Secondary | ICD-10-CM | POA: Diagnosis not present

## 2016-11-26 DIAGNOSIS — C8290 Follicular lymphoma, unspecified, unspecified site: Secondary | ICD-10-CM | POA: Diagnosis not present

## 2016-11-26 DIAGNOSIS — Z0000 Encounter for general adult medical examination without abnormal findings: Secondary | ICD-10-CM | POA: Diagnosis not present

## 2016-11-26 DIAGNOSIS — J984 Other disorders of lung: Secondary | ICD-10-CM | POA: Diagnosis not present

## 2016-11-26 DIAGNOSIS — I10 Essential (primary) hypertension: Secondary | ICD-10-CM | POA: Diagnosis not present

## 2016-11-26 DIAGNOSIS — C3491 Malignant neoplasm of unspecified part of right bronchus or lung: Secondary | ICD-10-CM | POA: Diagnosis not present

## 2016-11-26 DIAGNOSIS — E119 Type 2 diabetes mellitus without complications: Secondary | ICD-10-CM | POA: Diagnosis not present

## 2016-11-30 ENCOUNTER — Other Ambulatory Visit: Payer: Self-pay | Admitting: Internal Medicine

## 2016-12-03 ENCOUNTER — Telehealth: Payer: Self-pay | Admitting: Internal Medicine

## 2016-12-03 ENCOUNTER — Inpatient Hospital Stay: Payer: Medicare HMO

## 2016-12-03 ENCOUNTER — Inpatient Hospital Stay: Payer: Medicare HMO | Attending: Internal Medicine | Admitting: Internal Medicine

## 2016-12-03 DIAGNOSIS — R109 Unspecified abdominal pain: Secondary | ICD-10-CM | POA: Diagnosis not present

## 2016-12-03 DIAGNOSIS — Z5111 Encounter for antineoplastic chemotherapy: Secondary | ICD-10-CM | POA: Insufficient documentation

## 2016-12-03 DIAGNOSIS — I499 Cardiac arrhythmia, unspecified: Secondary | ICD-10-CM | POA: Diagnosis not present

## 2016-12-03 DIAGNOSIS — Z125 Encounter for screening for malignant neoplasm of prostate: Secondary | ICD-10-CM

## 2016-12-03 DIAGNOSIS — C3412 Malignant neoplasm of upper lobe, left bronchus or lung: Secondary | ICD-10-CM | POA: Insufficient documentation

## 2016-12-03 DIAGNOSIS — R079 Chest pain, unspecified: Secondary | ICD-10-CM | POA: Insufficient documentation

## 2016-12-03 DIAGNOSIS — J45909 Unspecified asthma, uncomplicated: Secondary | ICD-10-CM | POA: Insufficient documentation

## 2016-12-03 DIAGNOSIS — Z9981 Dependence on supplemental oxygen: Secondary | ICD-10-CM | POA: Insufficient documentation

## 2016-12-03 DIAGNOSIS — Z13220 Encounter for screening for lipoid disorders: Secondary | ICD-10-CM

## 2016-12-03 DIAGNOSIS — K219 Gastro-esophageal reflux disease without esophagitis: Secondary | ICD-10-CM | POA: Diagnosis not present

## 2016-12-03 DIAGNOSIS — C641 Malignant neoplasm of right kidney, except renal pelvis: Secondary | ICD-10-CM

## 2016-12-03 DIAGNOSIS — Z95828 Presence of other vascular implants and grafts: Secondary | ICD-10-CM

## 2016-12-03 DIAGNOSIS — Z87891 Personal history of nicotine dependence: Secondary | ICD-10-CM | POA: Diagnosis not present

## 2016-12-03 DIAGNOSIS — R05 Cough: Secondary | ICD-10-CM | POA: Insufficient documentation

## 2016-12-03 DIAGNOSIS — R0602 Shortness of breath: Secondary | ICD-10-CM | POA: Diagnosis not present

## 2016-12-03 DIAGNOSIS — Z809 Family history of malignant neoplasm, unspecified: Secondary | ICD-10-CM | POA: Diagnosis not present

## 2016-12-03 DIAGNOSIS — E119 Type 2 diabetes mellitus without complications: Secondary | ICD-10-CM

## 2016-12-03 DIAGNOSIS — Z7689 Persons encountering health services in other specified circumstances: Secondary | ICD-10-CM | POA: Diagnosis not present

## 2016-12-03 DIAGNOSIS — G4700 Insomnia, unspecified: Secondary | ICD-10-CM | POA: Insufficient documentation

## 2016-12-03 DIAGNOSIS — Z8673 Personal history of transient ischemic attack (TIA), and cerebral infarction without residual deficits: Secondary | ICD-10-CM

## 2016-12-03 DIAGNOSIS — N189 Chronic kidney disease, unspecified: Secondary | ICD-10-CM | POA: Diagnosis not present

## 2016-12-03 DIAGNOSIS — C8300 Small cell B-cell lymphoma, unspecified site: Secondary | ICD-10-CM | POA: Diagnosis not present

## 2016-12-03 DIAGNOSIS — Z79899 Other long term (current) drug therapy: Secondary | ICD-10-CM | POA: Diagnosis not present

## 2016-12-03 DIAGNOSIS — Z85038 Personal history of other malignant neoplasm of large intestine: Secondary | ICD-10-CM | POA: Diagnosis not present

## 2016-12-03 DIAGNOSIS — Z8041 Family history of malignant neoplasm of ovary: Secondary | ICD-10-CM | POA: Insufficient documentation

## 2016-12-03 DIAGNOSIS — I129 Hypertensive chronic kidney disease with stage 1 through stage 4 chronic kidney disease, or unspecified chronic kidney disease: Secondary | ICD-10-CM | POA: Insufficient documentation

## 2016-12-03 DIAGNOSIS — C3492 Malignant neoplasm of unspecified part of left bronchus or lung: Secondary | ICD-10-CM

## 2016-12-03 DIAGNOSIS — J449 Chronic obstructive pulmonary disease, unspecified: Secondary | ICD-10-CM | POA: Diagnosis not present

## 2016-12-03 LAB — CBC WITH DIFFERENTIAL/PLATELET
Basophils Absolute: 0.1 10*3/uL (ref 0–0.1)
Basophils Relative: 1 %
EOS ABS: 0.2 10*3/uL (ref 0–0.7)
EOS PCT: 2 %
HCT: 27.8 % — ABNORMAL LOW (ref 40.0–52.0)
Hemoglobin: 9.1 g/dL — ABNORMAL LOW (ref 13.0–18.0)
LYMPHS ABS: 2.1 10*3/uL (ref 1.0–3.6)
Lymphocytes Relative: 29 %
MCH: 28.1 pg (ref 26.0–34.0)
MCHC: 32.6 g/dL (ref 32.0–36.0)
MCV: 86 fL (ref 80.0–100.0)
MONOS PCT: 8 %
Monocytes Absolute: 0.6 10*3/uL (ref 0.2–1.0)
Neutro Abs: 4.3 10*3/uL (ref 1.4–6.5)
Neutrophils Relative %: 60 %
PLATELETS: 189 10*3/uL (ref 150–440)
RBC: 3.23 MIL/uL — ABNORMAL LOW (ref 4.40–5.90)
RDW: 14.9 % — ABNORMAL HIGH (ref 11.5–14.5)
WBC: 7.2 10*3/uL (ref 3.8–10.6)

## 2016-12-03 LAB — COMPREHENSIVE METABOLIC PANEL
ALBUMIN: 4 g/dL (ref 3.5–5.0)
ALT: 14 U/L — AB (ref 17–63)
AST: 18 U/L (ref 15–41)
Alkaline Phosphatase: 78 U/L (ref 38–126)
Anion gap: 7 (ref 5–15)
BILIRUBIN TOTAL: 0.6 mg/dL (ref 0.3–1.2)
BUN: 22 mg/dL — AB (ref 6–20)
CO2: 35 mmol/L — ABNORMAL HIGH (ref 22–32)
CREATININE: 1.17 mg/dL (ref 0.61–1.24)
Calcium: 8.9 mg/dL (ref 8.9–10.3)
Chloride: 98 mmol/L — ABNORMAL LOW (ref 101–111)
GFR calc Af Amer: >60 mL/min (ref >60)
GFR calc non Af Amer: >60 mL/min (ref >60)
GLUCOSE: 143 mg/dL — AB (ref 65–99)
Potassium: 4.8 mmol/L (ref 3.5–5.1)
SODIUM: 140 mmol/L (ref 135–145)
TOTAL PROTEIN: 7.3 g/dL (ref 6.5–8.1)

## 2016-12-03 LAB — LACTATE DEHYDROGENASE: LDH: 147 U/L (ref 98–192)

## 2016-12-03 LAB — LIPID PANEL
Cholesterol: 228 mg/dL — ABNORMAL HIGH (ref 0–200)
HDL: 124 mg/dL (ref >40)
LDL CALC: 93 mg/dL (ref 0–99)
TRIGLYCERIDES: 56 mg/dL (ref <150)
Total CHOL/HDL Ratio: 1.8 RATIO
VLDL: 11 mg/dL (ref 0–40)

## 2016-12-03 LAB — PSA: PSA: 1.03 ng/mL (ref 0.00–4.00)

## 2016-12-03 MED ORDER — SODIUM CHLORIDE 0.9 % IV SOLN
70.0000 mg/m2 | Freq: Once | INTRAVENOUS | Status: AC
  Administered 2016-12-03: 125 mg via INTRAVENOUS
  Filled 2016-12-03: qty 5

## 2016-12-03 MED ORDER — DEXAMETHASONE SODIUM PHOSPHATE 10 MG/ML IJ SOLN
10.0000 mg | Freq: Once | INTRAMUSCULAR | Status: AC
  Administered 2016-12-03: 10 mg via INTRAVENOUS
  Filled 2016-12-03: qty 1

## 2016-12-03 MED ORDER — PALONOSETRON HCL INJECTION 0.25 MG/5ML
0.2500 mg | Freq: Once | INTRAVENOUS | Status: AC
  Administered 2016-12-03: 0.25 mg via INTRAVENOUS
  Filled 2016-12-03: qty 5

## 2016-12-03 MED ORDER — SODIUM CHLORIDE 0.9% FLUSH
10.0000 mL | INTRAVENOUS | Status: DC | PRN
  Administered 2016-12-03: 10 mL via INTRAVENOUS
  Filled 2016-12-03: qty 10

## 2016-12-03 MED ORDER — ACETAMINOPHEN 325 MG PO TABS
650.0000 mg | ORAL_TABLET | Freq: Once | ORAL | Status: AC
  Administered 2016-12-03: 650 mg via ORAL
  Filled 2016-12-03: qty 2

## 2016-12-03 MED ORDER — HYDROCODONE-ACETAMINOPHEN 5-325 MG PO TABS: 1.0000 | ORAL_TABLET | Freq: Four times a day (QID) | ORAL | 0 refills | Status: DC | PRN

## 2016-12-03 MED ORDER — SODIUM CHLORIDE 0.9 % IV SOLN
Freq: Once | INTRAVENOUS | Status: AC
  Administered 2016-12-03: 11:00:00 via INTRAVENOUS
  Filled 2016-12-03: qty 1000

## 2016-12-03 MED ORDER — HEPARIN SOD (PORK) LOCK FLUSH 100 UNIT/ML IV SOLN
500.0000 [IU] | Freq: Once | INTRAVENOUS | Status: AC | PRN
  Administered 2016-12-03: 500 [IU]
  Filled 2016-12-03: qty 5

## 2016-12-03 MED ORDER — SODIUM CHLORIDE 0.9 % IV SOLN: 10.0000 mg | Freq: Once | INTRAVENOUS | Status: DC

## 2016-12-03 MED ORDER — DIPHENHYDRAMINE HCL 25 MG PO CAPS
50.0000 mg | ORAL_CAPSULE | Freq: Once | ORAL | Status: AC
  Administered 2016-12-03: 50 mg via ORAL
  Filled 2016-12-03: qty 2

## 2016-12-03 NOTE — Progress Notes (Signed)
[facility] OFFICE PROGRESS NOTE  Patient Care Team: [name], MD as PCP - General (Family Medicine)  Cancer Staging Lung cancer, upper lobe [facility]) Staging form: Lung, AJCC 7th Edition - Clinical stage from 06/15/2010: yT2, N2, M0 - Signed by [name], NP on 02/03/2015 - Pathologic stage from 04/04/2014: yT2, N2, M1 - Signed by [name], NP on 02/03/2015  Lymphoma, small lymphocytic ([place]) Staging form: Lymphoid Neoplasms, AJCC 6th Edition - Clinical stage from 10/15/2013: Stage II - Signed by [name], NP on 02/03/2015  Renal cell cancer [facility]) Staging form: Kidney, AJCC 7th Edition - Clinical stage from 02/03/2015: Stage I (yT1a, N0, M0) - Signed by [name], NP on 02/03/2015    Oncology History   1. Right upper lobe lung mass biopsies positive for adenocarcinoma; PET- T2, N2, M0;  stage IIIA;  carboplatinum Alimta and Avastin;  [april  9th of 2013]  Patient had an allergic reaction to carboplatinum with acute bronchospasm in May of 2013; carbo-discontinued.; Maintenance Alimta and Avastin  from June of 2013 hold chemotherapy because of increasing shortness of breath (in July, 2013); VARISTRAT-GOOD; Started on [place] February 3 about 2014; Diarrhea 4 days after Tarceva was started.  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   # SEP 2017- GAZYVA x1 [stopped sec to respi issues]  # Nov 1st week- START Ibrutinib 2 pills day; Last Jan 22nd 2018 [sec to co-pay].   # FEB 19th- cycle #1 Bendamustine [day-1& 2- '70mg'$ /m2; Rituxan with cycle #2  # RIGHT KIDNEY CA- RCC [s/p Bx]- s/p Cryo  [Dr.Yamagat; IR; 2016]   #  Poor pulmonary function; 3 L home O2.     Lung cancer, upper lobe ([place])   02/03/2015 Initial Diagnosis    Lung cancer, upper lobe       Lymphoma, small lymphocytic ([place])   02/03/2015 Initial Diagnosis    Lymphoma, small lymphocytic (H[facility])      Cancer of lung ([place])   12/21/2015 Initial Diagnosis    Cancer of lung ([place])      Primary cancer of right upper lobe of lung ([place])   06/01/2016 Initial Diagnosis    Primary cancer of right upper lobe of lung (H[facility])       INTERVAL HISTORY:  Craig [name]. 66 y.o.  male pleasant patientHistory of chronic respiratory failure on home O2 with above history of Multiple malignancies- most recently with progressive SLL- Patient ran out of his Assistance for his ibrutinib.  He denies any lumps or bumps.  Patient continues to have pain/ chronic chest wall and also abdominal pain for which he is on narcotic pain medication. He denies any weight loss. Denies any night sweats. His chronic shortness of breath on 3 L of oxygen; Currently breathing is at baseline. Complains of chronic cough. Denies any fevers or chills.  REVIEW OF SYSTEMS:  A complete 10 point review of system is done which is negative except mentioned above/history of present illness.   PAST MEDICAL HISTORY :  Past Medical History:  Diagnosis Date  . Asthma   .  Cancer ([place])   . Colon cancer ([place])   . COPD (chronic obstructive pulmonary disease) ([place])   . Diabetes mellitus without complication ([place])   . Difficulty sleeping   . Dysrhythmia   . GERD (gastroesophageal reflux disease)   . Glaucoma   . History of bronchitis   . Hypertension   . Lung cancer ([place])    right upper lobe mass positive for adenocarcinoma  . Lymphoma (H[facility])    low grade  . Renal cancer ([place])   . Respiratory failure ([place]) 07/12/2010   acute  . Right renal mass   . Shortness of breath dyspnea   . Stroke ([place]) 1990'S   NO RESIDUAL PROBLEMS  . Supplemental oxygen dependent     USES 3 LITERS CONTINUOUSLY    PAST SURGICAL HISTORY :   Past Surgical History:  Procedure Laterality Date  . IR GENERIC HISTORICAL  05/31/2016   IR RADIOLOGIST EVAL & MGMT 05/31/2016 Aletta Edouard, MD [facility] INTERV RAD    FAMILY HISTORY :   Family History  Problem Relation Age of Onset  . Diabetes Mellitus II Mother   . Hypertension Mother   . Cancer Mother   . Heart disease Father   . Hypertension Father   . Tuberculosis Father   . Diabetes Mellitus II Sister   . Cancer Sister   . Prostate cancer Brother     SOCIAL HISTORY:   Social History  Substance Use Topics  . Smoking status: Former Smoker    Packs/day: 1.00    Years: 30.00    Types: Cigarettes    Start date: 12/28/1968    Quit date: 01/13/2010  . Smokeless tobacco: Never Used  . Alcohol use No     Comment: stopped 2002     ALLERGIES:  is allergic to iodinated diagnostic agents and nexium [esomeprazole magnesium].  MEDICATIONS:  Current Outpatient Prescriptions  Medication Sig Dispense Refill  . albuterol (PROVENTIL HFA;VENTOLIN HFA) 108 (90 BASE) MCG/ACT inhaler Inhale 2 puffs into the lungs every 6 (six) hours as needed for wheezing or shortness of breath.     Marland Kitchen albuterol (PROVENTIL) (2.5 MG/3ML) 0.083% nebulizer solution USE 1 VIAL IN NEBULIZER EVERY 3-4 HOURS AS NEEDED 75 mL 3  . budesonide-formoterol (SYMBICORT) 160-4.5 MCG/ACT inhaler Inhale 2 puffs into the lungs 2 (two) times daily.     . diphenhydrAMINE (BENADRYL) 25 MG tablet Take 2 tablets (50 mg total) by mouth as directed. (Patient taking differently: Take 25 mg by mouth as needed for itching or allergies. ) 2 tablet 0  . HYDROcodone-acetaminophen (NORCO/VICODIN) 5-325 MG tablet Take 1 tablet by mouth every 6 (six) hours as needed for moderate pain. Please Note new directions 120 tablet 0  . ibrutinib (IMBRUVICA) 140 MG capsul Take 3 capsules (420 mg total) by mouth daily. 90 capsule 6  . loperamide (IMODIUM A-D) 2 MG tablet Take 2 mg by mouth 4  (four) times daily as needed for diarrhea or loose stools. Reported on 12/21/2015    . losartan-hydrochlorothiazide (HYZAAR) 50-12.5 MG per tablet Take 1 tablet by mouth daily.    . pioglitazone (ACTOS) 30 MG tablet Take 30 mg by mouth daily.    . predniSONE (DELTASONE) 10 MG tablet TAKE 1 TABLET (10 MG TOTAL) BY MOUTH DAILY WITH BREAKFAST. 30 tablet 3  . temazepam (RESTORIL) 15 MG capsule Take 15 mg by mouth at bedtime as needed.     . tiotropium (SPIRIVA) 18 MCG inhalation capsule Place 18 mcg into inhaler and inhale daily.     Marland Kitchen  zolpidem (AMBIEN) 5 MG tablet Take 1 or 2 tabs at night if needed     No current facility-administered medications for this visit.    Facility-Administered Medications Ordered in Other Visits  Medication Dose Route Frequency Provider Last Rate Last Dose  . sodium chloride 0.9 % injection 10 mL  10 mL Intracatheter PRN Forest Gleason, MD   10 mL at 04/28/15 1410  . sodium chloride 0.9 % injection 10 mL  10 mL Intravenous PRN Forest Gleason, MD   10 mL at 05/12/15 1350    PHYSICAL EXAMINATION: ECOG PERFORMANCE STATUS: 2 - Symptomatic, <50% confined to bed  There were no vitals taken for this visit.  There were no vitals filed for this visit.  GENERAL: Well-nourished well-developed; Alert, no distress and comfortable.   Alone. He is on home oxygen. He is in a wheelchair.  EYES: no pallor or icterus OROPHARYNX: no thrush or ulceration; good dentition  NECK: supple, no masses felt LYMPH:  no palpable lymphadenopathy in the cervical, axillary or inguinal regions LUNGS:Bilateral decreased air entry to auscultationn and  No wheeze or crackles HEART/CVS: regular rate & rhythm and no murmurs; No lower extremity edema ABDOMEN:abdomen soft, non-tender and normal bowel sounds Musculoskeletal:no cyanosis of digits and no clubbing  PSYCH: alert & oriented x 3 with fluent speech NEURO: no focal motor/sensory deficits SKIN:  no rashes or significant lesions  LABORATORY DATA:   I have reviewed the data as listed    Component Value Date/Time   NA 140 12/03/2016 0906   NA 139 01/17/2015 0913   K 4.8 12/03/2016 0906   K 3.8 01/17/2015 0913   CL 98 (L) 12/03/2016 0906   CL 99 (L) 01/17/2015 0913   CO2 35 (H) 12/03/2016 0906   CO2 35 (H) 01/17/2015 0913   GLUCOSE 143 (H) 12/03/2016 0906   GLUCOSE 169 (H) 01/17/2015 0913   BUN 22 (H) 12/03/2016 0906   BUN 15 01/17/2015 0913   CREATININE 1.17 12/03/2016 0906   CREATININE 1.23 01/17/2015 0913   CALCIUM 8.9 12/03/2016 0906   CALCIUM 8.8 (L) 01/17/2015 0913   PROT 7.3 12/03/2016 0906   PROT 6.6 01/17/2015 0913   ALBUMIN 4.0 12/03/2016 0906   ALBUMIN 3.7 01/17/2015 0913   AST 18 12/03/2016 0906   AST 20 01/17/2015 0913   ALT 14 (L) 12/03/2016 0906   ALT 15 (L) 01/17/2015 0913   ALKPHOS 78 12/03/2016 0906   ALKPHOS 76 01/17/2015 0913   BILITOT 0.6 12/03/2016 0906   BILITOT 0.5 01/17/2015 0913   GFRNONAA >60 12/03/2016 0906   GFRNONAA >60 01/17/2015 0913   GFRAA >60 12/03/2016 0906   GFRAA >60 01/17/2015 0913    No results found for: SPEP, UPEP  Lab Results  Component Value Date   WBC 7.2 12/03/2016   NEUTROABS 4.3 12/03/2016   HGB 9.1 (L) 12/03/2016   HCT 27.8 (L) 12/03/2016   MCV 86.0 12/03/2016   PLT 189 12/03/2016      Chemistry      Component Value Date/Time   NA 140 12/03/2016 0906   NA 139 01/17/2015 0913   K 4.8 12/03/2016 0906   K 3.8 01/17/2015 0913   CL 98 (L) 12/03/2016 0906   CL 99 (L) 01/17/2015 0913   CO2 35 (H) 12/03/2016 0906   CO2 35 (H) 01/17/2015 0913   BUN 22 (H) 12/03/2016 0906   BUN 15 01/17/2015 0913   CREATININE 1.17 12/03/2016 0906   CREATININE 1.23 01/17/2015 0913  Component Value Date/Time   CALCIUM 8.9 12/03/2016 0906   CALCIUM 8.8 (L) 01/17/2015 0913   ALKPHOS 78 12/03/2016 0906   ALKPHOS 76 01/17/2015 0913   AST 18 12/03/2016 0906   AST 20 01/17/2015 0913   ALT 14 (L) 12/03/2016 0906   ALT 15 (L) 01/17/2015 0913   BILITOT 0.6 12/03/2016 0906    BILITOT 0.5 01/17/2015 0913       RADIOGRAPHIC STUDIES: I have personally reviewed the radiological images as listed and agreed with the findings in the report. No results found.   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 Hemoglobin approximately 9.6. Discontinued ibrutinib sec to financial issues.   # Proceed with [place] reduced to 70 mg/m day 1 and day 2]- Rituxan cycle #1 today; hold rituximab for the first 2 cycles.   # Growth factor-Neulasta/On pro would be given as prophylaxis for chemotherapy-induced neutropenia to prevent febrile neutropenias.   # Pain- chest when coughing- continue pain meds prn. New script given.   # Advanced COPD 3 L of oxygen- discussed the potential infusion reactions with Rituxan.  # CKD creatinine 1.2-1.3. This has to be monitored closely with bendamustine. Also cut the dose of bendamustine.  # follow up with me in 4 weeks/labs; bendamustine. Lab in 2 weeks.   Orders Placed This Encounter  Procedures  . Hemoglobin A1c    Standing Status:   Future    Number of Occurrences:   1    Standing Expiration Date:   12/03/2017  . Lipid panel    Standing Status:   Future    Number of Occurrences:   1    Standing Expiration Date:   12/03/2017  . PSA    Standing Status:   Future    Number of Occurrences:   1    Standing Expiration Date:   12/03/2017  . CBC with Differential    Standing Status:   Future    Standing Expiration Date:   12/03/2037  . Basic metabolic panel    Standing Status:   Future    Standing Expiration Date:   12/03/2037      [name], MD 12/03/2016 5:02 PM

## 2016-12-03 NOTE — Telephone Encounter (Signed)
Please fax path request for this pt- I have signed the requisition.

## 2016-12-03 NOTE — Assessment & Plan Note (Addendum)
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 Hemoglobin approximately 9.6. Discontinued ibrutinib sec to financial issues.   # Proceed with [place] reduced to 70 mg/m day 1 and day 2]- Rituxan cycle #1 today; hold rituximab for the first 2 cycles.   # Growth factor-Neulasta/On pro would be given as prophylaxis for chemotherapy-induced neutropenia to prevent febrile neutropenias.   # Pain- chest when coughing- continue pain meds prn. New script given.   # Advanced COPD 3 L of oxygen- discussed the potential infusion reactions with Rituxan.  # CKD creatinine 1.2-1.3. This has to be monitored closely with bendamustine. Also cut the dose of bendamustine.  # follow up with me in 4 weeks/labs; bendamustine. Lab in 2 weeks.

## 2016-12-03 NOTE — Progress Notes (Signed)
Per nursing, pt will only receive bendamustine today. No rituxan.

## 2016-12-03 NOTE — Progress Notes (Signed)
Patient here today for follow up.  Patient states he is having some trouble swallowing solid foods.  Needs refill on pain medication.  PCP asking for labs be for his office taking by our office, CBC,CMP, A1C, Lipids & PSA.

## 2016-12-04 ENCOUNTER — Inpatient Hospital Stay: Payer: Medicare HMO

## 2016-12-04 VITALS — BP 124/79 | HR 105 | Temp 96.3°F | Resp 20

## 2016-12-04 DIAGNOSIS — R079 Chest pain, unspecified: Secondary | ICD-10-CM | POA: Diagnosis not present

## 2016-12-04 DIAGNOSIS — R109 Unspecified abdominal pain: Secondary | ICD-10-CM | POA: Diagnosis not present

## 2016-12-04 DIAGNOSIS — I129 Hypertensive chronic kidney disease with stage 1 through stage 4 chronic kidney disease, or unspecified chronic kidney disease: Secondary | ICD-10-CM | POA: Diagnosis not present

## 2016-12-04 DIAGNOSIS — C3412 Malignant neoplasm of upper lobe, left bronchus or lung: Secondary | ICD-10-CM | POA: Diagnosis not present

## 2016-12-04 DIAGNOSIS — C8300 Small cell B-cell lymphoma, unspecified site: Secondary | ICD-10-CM | POA: Diagnosis not present

## 2016-12-04 DIAGNOSIS — N189 Chronic kidney disease, unspecified: Secondary | ICD-10-CM | POA: Diagnosis not present

## 2016-12-04 DIAGNOSIS — Z7689 Persons encountering health services in other specified circumstances: Secondary | ICD-10-CM | POA: Diagnosis not present

## 2016-12-04 DIAGNOSIS — Z5111 Encounter for antineoplastic chemotherapy: Secondary | ICD-10-CM | POA: Diagnosis not present

## 2016-12-04 DIAGNOSIS — C641 Malignant neoplasm of right kidney, except renal pelvis: Secondary | ICD-10-CM | POA: Diagnosis not present

## 2016-12-04 LAB — HEMOGLOBIN A1C
Hgb A1c MFr Bld: 6.2 % — ABNORMAL HIGH (ref 4.8–5.6)
Mean Plasma Glucose: 131 mg/dL

## 2016-12-04 MED ORDER — DEXAMETHASONE SODIUM PHOSPHATE 10 MG/ML IJ SOLN
10.0000 mg | Freq: Once | INTRAMUSCULAR | Status: AC
  Administered 2016-12-04: 10 mg via INTRAVENOUS
  Filled 2016-12-04: qty 1

## 2016-12-04 MED ORDER — SODIUM CHLORIDE 0.9 % IV SOLN
70.0000 mg/m2 | Freq: Once | INTRAVENOUS | Status: AC
  Administered 2016-12-04: 125 mg via INTRAVENOUS
  Filled 2016-12-04: qty 5

## 2016-12-04 MED ORDER — SODIUM CHLORIDE 0.9 % IV SOLN: 10.0000 mg | Freq: Once | INTRAVENOUS | Status: DC

## 2016-12-04 MED ORDER — SODIUM CHLORIDE 0.9% FLUSH
10.0000 mL | INTRAVENOUS | Status: DC | PRN
  Administered 2016-12-04: 10 mL via INTRAVENOUS
  Filled 2016-12-04: qty 10

## 2016-12-04 MED ORDER — PEGFILGRASTIM 6 MG/0.6ML ~~LOC~~ PSKT
6.0000 mg | PREFILLED_SYRINGE | Freq: Once | SUBCUTANEOUS | Status: AC
  Administered 2016-12-04: 6 mg via SUBCUTANEOUS
  Filled 2016-12-04: qty 0.6

## 2016-12-04 MED ORDER — HEPARIN SOD (PORK) LOCK FLUSH 100 UNIT/ML IV SOLN: 500.0000 [IU] | Freq: Once | INTRAVENOUS | Status: DC | PRN

## 2016-12-04 MED ORDER — SODIUM CHLORIDE 0.9 % IV SOLN
Freq: Once | INTRAVENOUS | Status: AC
  Administered 2016-12-04: 09:00:00 via INTRAVENOUS
  Filled 2016-12-04: qty 1000

## 2016-12-04 MED ORDER — HEPARIN SOD (PORK) LOCK FLUSH 100 UNIT/ML IV SOLN
500.0000 [IU] | Freq: Once | INTRAVENOUS | Status: AC
  Administered 2016-12-04: 500 [IU] via INTRAVENOUS
  Filled 2016-12-04: qty 5

## 2016-12-05 NOTE — Telephone Encounter (Signed)
Faxed as requested

## 2016-12-07 ENCOUNTER — Other Ambulatory Visit: Payer: Self-pay | Admitting: Internal Medicine

## 2016-12-07 ENCOUNTER — Other Ambulatory Visit: Payer: Self-pay

## 2016-12-07 ENCOUNTER — Inpatient Hospital Stay
Admission: EM | Admit: 2016-12-07 | Discharge: 2016-12-10 | DRG: 871 | Disposition: A | Discharge status: Home Health | Payer: Medicare HMO | Attending: Internal Medicine | Admitting: Internal Medicine

## 2016-12-07 ENCOUNTER — Emergency Department: Payer: Medicare HMO

## 2016-12-07 DIAGNOSIS — C3490 Malignant neoplasm of unspecified part of unspecified bronchus or lung: Secondary | ICD-10-CM | POA: Diagnosis present

## 2016-12-07 DIAGNOSIS — Z9221 Personal history of antineoplastic chemotherapy: Secondary | ICD-10-CM

## 2016-12-07 DIAGNOSIS — D6481 Anemia due to antineoplastic chemotherapy: Secondary | ICD-10-CM | POA: Diagnosis not present

## 2016-12-07 DIAGNOSIS — I10 Essential (primary) hypertension: Secondary | ICD-10-CM | POA: Diagnosis not present

## 2016-12-07 DIAGNOSIS — J9610 Chronic respiratory failure, unspecified whether with hypoxia or hypercapnia: Secondary | ICD-10-CM

## 2016-12-07 DIAGNOSIS — D630 Anemia in neoplastic disease: Secondary | ICD-10-CM | POA: Diagnosis present

## 2016-12-07 DIAGNOSIS — J441 Chronic obstructive pulmonary disease with (acute) exacerbation: Secondary | ICD-10-CM | POA: Diagnosis present

## 2016-12-07 DIAGNOSIS — R05 Cough: Secondary | ICD-10-CM | POA: Diagnosis not present

## 2016-12-07 DIAGNOSIS — Z8249 Family history of ischemic heart disease and other diseases of the circulatory system: Secondary | ICD-10-CM

## 2016-12-07 DIAGNOSIS — E119 Type 2 diabetes mellitus without complications: Secondary | ICD-10-CM | POA: Diagnosis not present

## 2016-12-07 DIAGNOSIS — Z87891 Personal history of nicotine dependence: Secondary | ICD-10-CM | POA: Diagnosis not present

## 2016-12-07 DIAGNOSIS — Z809 Family history of malignant neoplasm, unspecified: Secondary | ICD-10-CM

## 2016-12-07 DIAGNOSIS — G4700 Insomnia, unspecified: Secondary | ICD-10-CM

## 2016-12-07 DIAGNOSIS — H409 Unspecified glaucoma: Secondary | ICD-10-CM | POA: Diagnosis present

## 2016-12-07 DIAGNOSIS — J9620 Acute and chronic respiratory failure, unspecified whether with hypoxia or hypercapnia: Secondary | ICD-10-CM | POA: Diagnosis not present

## 2016-12-07 DIAGNOSIS — A419 Sepsis, unspecified organism: Principal | ICD-10-CM | POA: Diagnosis present

## 2016-12-07 DIAGNOSIS — Z85038 Personal history of other malignant neoplasm of large intestine: Secondary | ICD-10-CM

## 2016-12-07 DIAGNOSIS — J449 Chronic obstructive pulmonary disease, unspecified: Secondary | ICD-10-CM | POA: Diagnosis not present

## 2016-12-07 DIAGNOSIS — R0602 Shortness of breath: Secondary | ICD-10-CM | POA: Diagnosis not present

## 2016-12-07 DIAGNOSIS — R0981 Nasal congestion: Secondary | ICD-10-CM

## 2016-12-07 DIAGNOSIS — G8929 Other chronic pain: Secondary | ICD-10-CM | POA: Diagnosis present

## 2016-12-07 DIAGNOSIS — Z888 Allergy status to other drugs, medicaments and biological substances status: Secondary | ICD-10-CM

## 2016-12-07 DIAGNOSIS — K219 Gastro-esophageal reflux disease without esophagitis: Secondary | ICD-10-CM

## 2016-12-07 DIAGNOSIS — C3411 Malignant neoplasm of upper lobe, right bronchus or lung: Secondary | ICD-10-CM

## 2016-12-07 DIAGNOSIS — R110 Nausea: Secondary | ICD-10-CM

## 2016-12-07 DIAGNOSIS — Z7951 Long term (current) use of inhaled steroids: Secondary | ICD-10-CM

## 2016-12-07 DIAGNOSIS — Z79899 Other long term (current) drug therapy: Secondary | ICD-10-CM

## 2016-12-07 DIAGNOSIS — Z8572 Personal history of non-Hodgkin lymphomas: Secondary | ICD-10-CM | POA: Diagnosis not present

## 2016-12-07 DIAGNOSIS — I499 Cardiac arrhythmia, unspecified: Secondary | ICD-10-CM

## 2016-12-07 DIAGNOSIS — Z7952 Long term (current) use of systemic steroids: Secondary | ICD-10-CM

## 2016-12-07 DIAGNOSIS — J440 Chronic obstructive pulmonary disease with acute lower respiratory infection: Secondary | ICD-10-CM | POA: Diagnosis not present

## 2016-12-07 DIAGNOSIS — Z8042 Family history of malignant neoplasm of prostate: Secondary | ICD-10-CM

## 2016-12-07 DIAGNOSIS — Z85528 Personal history of other malignant neoplasm of kidney: Secondary | ICD-10-CM | POA: Diagnosis not present

## 2016-12-07 DIAGNOSIS — R197 Diarrhea, unspecified: Secondary | ICD-10-CM | POA: Diagnosis not present

## 2016-12-07 DIAGNOSIS — J189 Pneumonia, unspecified organism: Secondary | ICD-10-CM

## 2016-12-07 DIAGNOSIS — R52 Pain, unspecified: Secondary | ICD-10-CM

## 2016-12-07 DIAGNOSIS — R109 Unspecified abdominal pain: Secondary | ICD-10-CM | POA: Diagnosis present

## 2016-12-07 DIAGNOSIS — Y95 Nosocomial condition: Secondary | ICD-10-CM | POA: Diagnosis present

## 2016-12-07 DIAGNOSIS — Z9981 Dependence on supplemental oxygen: Secondary | ICD-10-CM

## 2016-12-07 DIAGNOSIS — D72829 Elevated white blood cell count, unspecified: Secondary | ICD-10-CM | POA: Diagnosis not present

## 2016-12-07 DIAGNOSIS — Z7984 Long term (current) use of oral hypoglycemic drugs: Secondary | ICD-10-CM

## 2016-12-07 DIAGNOSIS — Z833 Family history of diabetes mellitus: Secondary | ICD-10-CM

## 2016-12-07 DIAGNOSIS — R51 Headache: Secondary | ICD-10-CM | POA: Diagnosis present

## 2016-12-07 DIAGNOSIS — J40 Bronchitis, not specified as acute or chronic: Secondary | ICD-10-CM | POA: Diagnosis present

## 2016-12-07 DIAGNOSIS — C8580 Other specified types of non-Hodgkin lymphoma, unspecified site: Secondary | ICD-10-CM | POA: Diagnosis not present

## 2016-12-07 DIAGNOSIS — R5383 Other fatigue: Secondary | ICD-10-CM | POA: Diagnosis not present

## 2016-12-07 DIAGNOSIS — Z794 Long term (current) use of insulin: Secondary | ICD-10-CM

## 2016-12-07 DIAGNOSIS — C649 Malignant neoplasm of unspecified kidney, except renal pelvis: Secondary | ICD-10-CM

## 2016-12-07 DIAGNOSIS — Z91041 Radiographic dye allergy status: Secondary | ICD-10-CM

## 2016-12-07 DIAGNOSIS — Z8673 Personal history of transient ischemic attack (TIA), and cerebral infarction without residual deficits: Secondary | ICD-10-CM

## 2016-12-07 LAB — BASIC METABOLIC PANEL
Anion gap: 6 (ref 5–15)
BUN: 14 mg/dL (ref 6–20)
CALCIUM: 8.5 mg/dL — AB (ref 8.9–10.3)
CO2: 40 mmol/L — ABNORMAL HIGH (ref 22–32)
CREATININE: 1.18 mg/dL (ref 0.61–1.24)
Chloride: 92 mmol/L — ABNORMAL LOW (ref 101–111)
GFR calc Af Amer: >60 mL/min (ref >60)
GLUCOSE: 100 mg/dL — AB (ref 65–99)
Potassium: 4.9 mmol/L (ref 3.5–5.1)
SODIUM: 138 mmol/L (ref 135–145)

## 2016-12-07 LAB — URINALYSIS, COMPLETE (UACMP) WITH MICROSCOPIC
Bacteria, UA: NONE SEEN
Bilirubin Urine: NEGATIVE
GLUCOSE, UA: NEGATIVE mg/dL
Ketones, ur: NEGATIVE mg/dL
Leukocytes, UA: NEGATIVE
NITRITE: NEGATIVE
PROTEIN: NEGATIVE mg/dL
SPECIFIC GRAVITY, URINE: 1.009 (ref 1.005–1.030)
Squamous Epithelial / LPF: NONE SEEN
pH: 5 (ref 5.0–8.0)

## 2016-12-07 LAB — GLUCOSE, CAPILLARY
GLUCOSE-CAPILLARY: 198 mg/dL — AB (ref 65–99)
Glucose-Capillary: 108 mg/dL — ABNORMAL HIGH (ref 65–99)

## 2016-12-07 LAB — CBC
HEMATOCRIT: 26.1 % — AB (ref 40.0–52.0)
Hemoglobin: 8.5 g/dL — ABNORMAL LOW (ref 13.0–18.0)
MCH: 28.4 pg (ref 26.0–34.0)
MCHC: 32.7 g/dL (ref 32.0–36.0)
MCV: 86.7 fL (ref 80.0–100.0)
PLATELETS: 179 10*3/uL (ref 150–440)
RBC: 3.01 MIL/uL — ABNORMAL LOW (ref 4.40–5.90)
RDW: 15.5 % — AB (ref 11.5–14.5)
WBC: 37.4 10*3/uL — AB (ref 3.8–10.6)

## 2016-12-07 LAB — LACTIC ACID, PLASMA: LACTIC ACID, VENOUS: 1.3 mmol/L (ref 0.5–1.9)

## 2016-12-07 LAB — INFLUENZA PANEL BY PCR (TYPE A & B)
INFLBPCR: NEGATIVE
Influenza A By PCR: NEGATIVE

## 2016-12-07 MED ORDER — SODIUM CHLORIDE 0.9 % IV BOLUS (SEPSIS)
1000.0000 mL | Freq: Once | INTRAVENOUS | Status: AC
  Administered 2016-12-07: 1000 mL via INTRAVENOUS

## 2016-12-07 MED ORDER — IPRATROPIUM-ALBUTEROL 0.5-2.5 (3) MG/3ML IN SOLN
3.0000 mL | Freq: Once | RESPIRATORY_TRACT | Status: AC
  Administered 2016-12-07: 3 mL via RESPIRATORY_TRACT
  Filled 2016-12-07: qty 3

## 2016-12-07 MED ORDER — ONDANSETRON HCL 4 MG PO TABS: 4.0000 mg | ORAL_TABLET | Freq: Four times a day (QID) | ORAL | Status: DC | PRN

## 2016-12-07 MED ORDER — METHYLPREDNISOLONE SODIUM SUCC 125 MG IJ SOLR
125.0000 mg | Freq: Once | INTRAMUSCULAR | Status: AC
  Administered 2016-12-07: 125 mg via INTRAVENOUS
  Filled 2016-12-07: qty 2

## 2016-12-07 MED ORDER — LOSARTAN POTASSIUM-HCTZ 50-12.5 MG PO TABS
1.0000 | ORAL_TABLET | Freq: Every day | ORAL | Status: DC
  Filled 2016-12-07: qty 1

## 2016-12-07 MED ORDER — INSULIN ASPART 100 UNIT/ML ~~LOC~~ SOLN: 0.0000 [IU] | Freq: Every day | SUBCUTANEOUS | Status: DC

## 2016-12-07 MED ORDER — GLIPIZIDE 5 MG PO TABS
5.0000 mg | ORAL_TABLET | Freq: Every day | ORAL | Status: DC
  Filled 2016-12-07 (×2): qty 1

## 2016-12-07 MED ORDER — BISACODYL 5 MG PO TBEC: 5.0000 mg | DELAYED_RELEASE_TABLET | Freq: Every day | ORAL | Status: DC | PRN

## 2016-12-07 MED ORDER — LOPERAMIDE HCL 2 MG PO CAPS
2.0000 mg | ORAL_CAPSULE | Freq: Four times a day (QID) | ORAL | Status: DC | PRN
  Filled 2016-12-07 (×2): qty 1

## 2016-12-07 MED ORDER — INSULIN ASPART 100 UNIT/ML ~~LOC~~ SOLN
0.0000 [IU] | Freq: Three times a day (TID) | SUBCUTANEOUS | Status: DC
  Administered 2016-12-07: 2 [IU] via SUBCUTANEOUS
  Administered 2016-12-08 (×2): 1 [IU] via SUBCUTANEOUS
  Administered 2016-12-08: 3 [IU] via SUBCUTANEOUS
  Administered 2016-12-09 (×2): 1 [IU] via SUBCUTANEOUS
  Administered 2016-12-09: 12:00:00 4 [IU] via SUBCUTANEOUS
  Administered 2016-12-10: 1 [IU] via SUBCUTANEOUS
  Administered 2016-12-10: 12:00:00 2 [IU] via SUBCUTANEOUS
  Filled 2016-12-07 (×2): qty 1
  Filled 2016-12-07: qty 2
  Filled 2016-12-07: qty 1
  Filled 2016-12-07: qty 5
  Filled 2016-12-07: qty 3
  Filled 2016-12-07 (×2): qty 1
  Filled 2016-12-07: qty 2

## 2016-12-07 MED ORDER — ONDANSETRON HCL 4 MG/2ML IJ SOLN: 4.0000 mg | Freq: Four times a day (QID) | INTRAMUSCULAR | Status: DC | PRN

## 2016-12-07 MED ORDER — CEFEPIME-DEXTROSE 2 GM/50ML IV SOLR
2.0000 g | Freq: Two times a day (BID) | INTRAVENOUS | Status: DC
  Administered 2016-12-07 (×2): 2 g via INTRAVENOUS
  Filled 2016-12-07 (×3): qty 50

## 2016-12-07 MED ORDER — ACETAMINOPHEN 325 MG PO TABS
650.0000 mg | ORAL_TABLET | Freq: Four times a day (QID) | ORAL | Status: DC | PRN
  Administered 2016-12-09: 12:00:00 650 mg via ORAL
  Filled 2016-12-07: qty 2

## 2016-12-07 MED ORDER — HYDROCODONE-ACETAMINOPHEN 5-325 MG PO TABS
1.0000 | ORAL_TABLET | Freq: Four times a day (QID) | ORAL | Status: DC | PRN
  Administered 2016-12-07 – 2016-12-10 (×6): 1 via ORAL
  Filled 2016-12-07 (×7): qty 1

## 2016-12-07 MED ORDER — IPRATROPIUM-ALBUTEROL 0.5-2.5 (3) MG/3ML IN SOLN
3.0000 mL | RESPIRATORY_TRACT | Status: DC
  Administered 2016-12-07 – 2016-12-08 (×3): 3 mL via RESPIRATORY_TRACT
  Filled 2016-12-07 (×3): qty 3

## 2016-12-07 MED ORDER — ENOXAPARIN SODIUM 40 MG/0.4ML ~~LOC~~ SOLN
40.0000 mg | SUBCUTANEOUS | Status: DC
  Administered 2016-12-07 – 2016-12-09 (×3): 40 mg via SUBCUTANEOUS
  Filled 2016-12-07 (×3): qty 0.4

## 2016-12-07 MED ORDER — ONDANSETRON HCL 4 MG/2ML IJ SOLN
4.0000 mg | Freq: Once | INTRAMUSCULAR | Status: AC
  Administered 2016-12-07: 4 mg via INTRAVENOUS
  Filled 2016-12-07: qty 2

## 2016-12-07 MED ORDER — OSELTAMIVIR PHOSPHATE 75 MG PO CAPS
75.0000 mg | ORAL_CAPSULE | Freq: Two times a day (BID) | ORAL | Status: DC
  Administered 2016-12-07 – 2016-12-10 (×7): 75 mg via ORAL
  Filled 2016-12-07 (×7): qty 1

## 2016-12-07 MED ORDER — VANCOMYCIN HCL IN DEXTROSE 1-5 GM/200ML-% IV SOLN
1000.0000 mg | Freq: Once | INTRAVENOUS | Status: AC
  Administered 2016-12-07: 1000 mg via INTRAVENOUS
  Filled 2016-12-07: qty 200

## 2016-12-07 MED ORDER — CEFEPIME-DEXTROSE 1 GM/50ML IV SOLR
1.0000 g | Freq: Once | INTRAVENOUS | Status: AC
  Administered 2016-12-07: 1 g via INTRAVENOUS
  Filled 2016-12-07: qty 50

## 2016-12-07 MED ORDER — VANCOMYCIN HCL IN DEXTROSE 750-5 MG/150ML-% IV SOLN
750.0000 mg | Freq: Once | INTRAVENOUS | Status: AC
  Administered 2016-12-07: 750 mg via INTRAVENOUS
  Filled 2016-12-07: qty 150

## 2016-12-07 MED ORDER — METHYLPREDNISOLONE SODIUM SUCC 40 MG IJ SOLR
40.0000 mg | Freq: Two times a day (BID) | INTRAMUSCULAR | Status: DC
  Administered 2016-12-07 – 2016-12-10 (×6): 40 mg via INTRAVENOUS
  Filled 2016-12-07 (×6): qty 1

## 2016-12-07 MED ORDER — TIOTROPIUM BROMIDE MONOHYDRATE 18 MCG IN CAPS
18.0000 ug | ORAL_CAPSULE | Freq: Every day | RESPIRATORY_TRACT | Status: DC
  Administered 2016-12-08 – 2016-12-10 (×3): 18 ug via RESPIRATORY_TRACT
  Filled 2016-12-07 (×2): qty 5

## 2016-12-07 MED ORDER — TEMAZEPAM 7.5 MG PO CAPS
15.0000 mg | ORAL_CAPSULE | Freq: Every evening | ORAL | Status: DC | PRN
  Administered 2016-12-07 – 2016-12-10 (×3): 15 mg via ORAL
  Filled 2016-12-07 (×3): qty 2

## 2016-12-07 MED ORDER — SENNOSIDES-DOCUSATE SODIUM 8.6-50 MG PO TABS: 1.0000 | ORAL_TABLET | Freq: Every evening | ORAL | Status: DC | PRN

## 2016-12-07 MED ORDER — ACETAMINOPHEN 650 MG RE SUPP: 650.0000 mg | Freq: Four times a day (QID) | RECTAL | Status: DC | PRN

## 2016-12-07 MED ORDER — SODIUM CHLORIDE 0.9 % IV SOLN
INTRAVENOUS | Status: DC
  Administered 2016-12-07 – 2016-12-08 (×3): via INTRAVENOUS

## 2016-12-07 MED ORDER — MOMETASONE FURO-FORMOTEROL FUM 200-5 MCG/ACT IN AERO
2.0000 | INHALATION_SPRAY | Freq: Two times a day (BID) | RESPIRATORY_TRACT | Status: DC
  Administered 2016-12-07 – 2016-12-10 (×6): 2 via RESPIRATORY_TRACT
  Filled 2016-12-07 (×2): qty 8.8

## 2016-12-07 MED ORDER — SODIUM CHLORIDE 0.9% FLUSH
3.0000 mL | Freq: Two times a day (BID) | INTRAVENOUS | Status: DC
  Administered 2016-12-07: 3 mL via INTRAVENOUS
  Administered 2016-12-08: 21:00:00 10 mL via INTRAVENOUS

## 2016-12-07 MED ORDER — VANCOMYCIN HCL IN DEXTROSE 1-5 GM/200ML-% IV SOLN
1000.0000 mg | Freq: Two times a day (BID) | INTRAVENOUS | Status: DC
  Administered 2016-12-08 (×2): 1000 mg via INTRAVENOUS
  Filled 2016-12-07 (×4): qty 200

## 2016-12-07 MED ORDER — ALBUTEROL SULFATE (2.5 MG/3ML) 0.083% IN NEBU
3.0000 mL | INHALATION_SOLUTION | Freq: Four times a day (QID) | RESPIRATORY_TRACT | Status: DC | PRN
  Administered 2016-12-08: 22:00:00 3 mL via RESPIRATORY_TRACT
  Filled 2016-12-07: qty 3

## 2016-12-07 NOTE — Progress Notes (Signed)
Pharmacy Antibiotic Note  Craig [name]. is a 66 y.o. male admitted on 12/07/2016 with sepsis.  Pharmacy has been consulted for vancomycin/cefepime dosing.  Plan: Patient has a h/o lymphoma, renal cell cancer, and lung cancer w/ leukocytosis of 37.4 Patient received vancomycin 1g IV x 1 in ED @ 1224, will give another 750 mg dose to complete a 25 mg/kg load. Will initiate vancomycin 1g q12h to being 2/24 @ 0300 and will draw a trough prior to the 4th dose 2/25 @ 0200. Will draw trough sooner if renal function declines. Expected trough: 20.6 mg/L Ke 0.053 T1/2 12 hours CrCl 58 ml/min Will initiate Cefepime 2g IV q12h per CrCl < 60 ml/min   Weight: 145 lb (65.8 kg)  Temp (24hrs), Avg:99.2 F (37.3 C), Min:99.2 F (37.3 C), Max:99.2 F (37.3 C)   Recent Labs Lab 12/03/16 0906 12/07/16 1135 12/07/16 1149  WBC 7.2 37.4*  --   CREATININE 1.17 1.18  --   LATICACIDVEN  --   --  1.3    Estimated Creatinine Clearance: 58.1 mL/min (by C-G formula based on SCr of 1.18 mg/dL).    Allergies  Allergen Reactions  . Iodinated Diagnostic Agents Shortness Of Breath and Nausea And Vomiting    13-hour prep  . Nexium [Esomeprazole Magnesium] Other (See Comments)    headache    Antimicrobials this admission: 2/23 vanc >>  2/23 cfp >>   Dose adjustments this admission: Cefepime 2g q12h per CrCl < 60 ml/min  Microbiology results: 2/23 BCx: sent 2/23 UCx: sent   Thank you for allowing pharmacy to be a part of this patient's care.  Tobie Lords, PharmD, BCPS Clinical Pharmacist 12/07/2016

## 2016-12-07 NOTE — Progress Notes (Signed)
Patient is very friendly and hospitable. He said that he has been battling cancer since 2011 and he acknowledge that it is due to God's love he is still alive. Chaplain provided education and an Advance Directives which patient appreciated.

## 2016-12-07 NOTE — ED Notes (Signed)
Report to [place], Therapist, sports

## 2016-12-07 NOTE — ED Notes (Signed)
Pt states that he cannot eat the sandwich tray. Requested food from dietary.

## 2016-12-07 NOTE — Consult Note (Signed)
[facility] CONSULT NOTE  Patient Care Team: [name], MD as PCP - General (Family Medicine)  CHIEF COMPLAINTS/PURPOSE OF CONSULTATION:  SLL/sepsis  HISTORY OF PRESENTING ILLNESS:  Craig [name]. 66 y.o.  male with multiple malignancies; and chronic respiratory failure/COPD on 3 L of home O2 -was most recently treated for SLL with bendamustine single agent approximately 4 days ago. Patient also received Neulasta.  Patient noted to have worsening shortness of breath today prior to admission. Subjective feeling of warmth and no fevers. Worsening cough/congestion. No hemoptysis. Complaint of body aches. Positive for nausea no vomiting. Intermittent diarrhea not any worse. Patient was feeling poorly.  On hospitalization- white count was elevated around 36; no differential. Lactic acid normal. Renal function at baseline. Patient has been started on IV antibiotics steroids and also Tamiflu. Influenza test negative. Chest x-ray negative for any acute process.  He feels little better since admission the hospital. However continues to have difficulty breathing. He is also taking breathing treatments.  ROS: A complete 10 point review of system is done which is negative except mentioned above in history of present illness  MEDICAL HISTORY:  Past Medical History:  Diagnosis Date  . Asthma   . Cancer ([place])   . Colon cancer ([place])   . COPD (chronic obstructive pulmonary disease) ([place])   . Diabetes mellitus without complication ([place])   . Difficulty sleeping   . Dysrhythmia   . GERD (gastroesophageal reflux disease)   . Glaucoma   . History of bronchitis   . Hypertension   . 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 Lymphoma ([facility])    low grade  . Renal cancer ([place])   . Respiratory failure ([place]) 07/12/2010   acute  . Right renal mass   . Shortness of breath dyspnea   . Stroke ([place]) 1990'S   NO RESIDUAL PROBLEMS  . Supplemental oxygen  dependent    USES 3 LITERS CONTINUOUSLY    SURGICAL HISTORY: Past Surgical History:  Procedure Laterality Date  . IR GENERIC HISTORICAL  05/31/2016   IR RADIOLOGIST EVAL & MGMT 05/31/2016 Aletta Edouard, MD [facility] INTERV RAD    SOCIAL HISTORY: Social History   Social History  . Marital status: Single    Spouse name: N/A  . Number of children: N/A  . Years of education: N/A   Occupational History  . Not on file.   Social History Main Topics  . Smoking status: Former Smoker    Packs/day: 1.00    Years: 30.00    Types: Cigarettes    Start date: 12/28/1968    Quit date: 01/13/2010  . Smokeless tobacco: Never Used  . Alcohol use No     Comment: stopped 2002   . Drug use: No  . Sexual activity: Not on file   Other Topics Concern  . Not on file   Social History Narrative  . No narrative on file    FAMILY HISTORY: Family History  Problem Relation Age of Onset  . Diabetes Mellitus II Mother   . Hypertension Mother   . Cancer Mother   . Heart disease Father   . Hypertension Father   . Tuberculosis Father   . Diabetes Mellitus II Sister   . Cancer Sister   . Prostate cancer Brother     ALLERGIES:  is allergic to iodinated diagnostic agents and nexium [esomeprazole magnesium].  MEDICATIONS:  Current Facility-Administered Medications  Medication Dose Route Frequency Provider  Last Rate Last Dose  . 0.9 %  sodium chloride infusion   Intravenous Continuous Bettey Costa, MD      . acetaminophen (TYLENOL) tablet 650 mg  650 mg Oral Q6H PRN Bettey Costa, MD       Or  . acetaminophen (TYLENOL) suppository 650 mg  650 mg Rectal Q6H PRN Sital Mody, MD      . albuterol (PROVENTIL) (2.5 MG/3ML) 0.083% nebulizer solution 3 mL  3 mL Inhalation Q6H PRN Bettey Costa, MD      . bisacodyl (DULCOLAX) EC tablet 5 mg  5 mg Oral Daily PRN Bettey Costa, MD      . ceFEPIme (MAXIPIME) 2 GM / 62m IVPB premix  2 g Intravenous Q12H Sital Mody, MD      . enoxaparin (LOVENOX) injection 40 mg  40 mg  Subcutaneous Q24H Sital Mody, MD      . [Derrill MemoON 12/08/2016] glipiZIDE (GLUCOTROL) tablet 5 mg  5 mg Oral QAC breakfast Sital Mody, MD      . HYDROcodone-acetaminophen (NORCO/VICODIN) 5-325 MG per tablet 1 tablet  1 tablet Oral Q6H PRN Sital Mody, MD      . insulin aspart (novoLOG) injection 0-5 Units  0-5 Units Subcutaneous QHS Sital Mody, MD      . insulin aspart (novoLOG) injection 0-9 Units  0-9 Units Subcutaneous TID WC Sital Mody, MD      . ipratropium-albuterol (DUONEB) 0.5-2.5 (3) MG/3ML nebulizer solution 3 mL  3 mL Nebulization Q4H Sital Mody, MD      . loperamide (IMODIUM) capsule 2 mg  2 mg Oral QID PRN SBettey Costa MD      . [Derrill MemoON 12/08/2016] losartan-hydrochlorothiazide (HYZAAR) 50-12.5 MG per tablet 1 tablet  1 tablet Oral Daily Sital Mody, MD      . methylPREDNISolone sodium succinate (SOLU-MEDROL) 40 mg/mL injection 40 mg  40 mg Intravenous Q12H Sital Mody, MD      . mometasone-formoterol (DULERA) 200-5 MCG/ACT inhaler 2 puff  2 puff Inhalation BID Sital Mody, MD      . ondansetron (ZOFRAN) tablet 4 mg  4 mg Oral Q6H PRN SBettey Costa MD       Or  . ondansetron (ZOFRAN) injection 4 mg  4 mg Intravenous Q6H PRN SBettey Costa MD      . oseltamivir (TAMIFLU) capsule 75 mg  75 mg Oral BID Sital Mody, MD      . senna-docusate (Senokot-S) tablet 1 tablet  1 tablet Oral QHS PRN SBettey Costa MD      . sodium chloride flush (NS) 0.9 % injection 3 mL  3 mL Intravenous Q12H SBettey Costa MD      . [Derrill MemoON 12/08/2016] tiotropium (SPIRIVA) inhalation capsule 18 mcg  18 mcg Inhalation Daily SBettey Costa MD      . [Derrill MemoON 12/08/2016] vancomycin (VANCOCIN) IVPB 1000 mg/200 mL premix  1,000 mg Intravenous Q12H Sital Mody, MD      . vancomycin (VANCOCIN) IVPB 750 mg/150 ml premix  750 mg Intravenous Once CRudene Re MD       Facility-Administered Medications Ordered in Other Encounters  Medication Dose Route Frequency Provider Last Rate Last Dose  . sodium chloride 0.9 % injection 10 mL  10  mL Intracatheter PRN JForest Gleason MD   10 mL at 04/28/15 1410  . sodium chloride 0.9 % injection 10 mL  10 mL Intravenous PRN JForest Gleason MD   10 mL at 05/12/15 1350      .  PHYSICAL  EXAMINATION:  Vitals:   12/07/16 1500 12/07/16 1547  BP: (!) 149/81 125/73  Pulse: (!) 111 (!) 117  Resp: (!) 29   Temp:  98 F (36.7 C)   Filed Weights   12/07/16 1125 12/07/16 1658  Weight: 145 lb (65.8 kg) 144 lb (65.3 kg)    GENERAL: Well-nourished well-developed; Alert, no distress and comfortable.   On oxygen. Accompanied by family.mild to Moderate respiratory distress.  EYES: no pallor or icterus OROPHARYNX: no thrush or ulceration. NECK: supple, no masses felt LYMPH:  no palpable lymphadenopathy in the cervical, axillary or inguinal regions LUNGS:  decreased breath sounds bilaterally.  No wheeze or crackles HEART/CVS: regular rate & rhythm and no murmurs; No lower extremity edema ABDOMEN: abdomen soft, non-tender and normal bowel sounds Musculoskeletal:no cyanosis of digits and no clubbing  PSYCH: alert & oriented x 3 with fluent speech NEURO: no focal motor/sensory deficits SKIN:  no rashes or significant lesions  LABORATORY DATA:  I have reviewed the data as listed Lab Results  Component Value Date   WBC 37.4 (H) 12/07/2016   HGB 8.5 (L) 12/07/2016   HCT 26.1 (L) 12/07/2016   MCV 86.7 12/07/2016   PLT 179 12/07/2016    Recent Labs  09/03/16 1143 11/05/16 1015 12/03/16 0906 12/07/16 1135  NA 139 135 140 138  K 4.0 4.5 4.8 4.9  CL 94* 92* 98* 92*  CO2 39* 36* 35* 40*  GLUCOSE 99 171* 143* 100*  BUN 22* 30* 22* 14  CREATININE 1.14 1.28* 1.17 1.18  CALCIUM 9.0 8.9 8.9 8.5*  GFRNONAA >60 57* >60 >60  GFRAA >60 >60 >60 >60  PROT 7.0 7.2 7.3  --   ALBUMIN 3.9 3.9 4.0  --   AST '15 19 18  '$ --   ALT 12* 13* 14*  --   ALKPHOS 71 71 78  --   BILITOT 0.5 0.4 0.6  --     RADIOGRAPHIC STUDIES: I have personally reviewed the radiological images as listed and agreed with  the findings in the report. Dg Chest 2 View  Result Date: 12/07/2016 CLINICAL DATA:  Nausea with cough and increasing shortness of breath for 2 days. Lung cancer. EXAM: CHEST  2 VIEW COMPARISON:  08/21/2016 and 07/02/2016. FINDINGS: Frontal exam was repeated. Left subclavian Port-A-Cath tip is unchanged at the lower SVC level. The heart size and mediastinal contours are stable. There is stable volume loss and linear scarring in the right upper lobe. The lungs are hyperinflated without airspace disease, edema or pleural effusion. Old rib fractures are present bilaterally. IMPRESSION: Stable chest.  No acute cardiopulmonary process. Electronically Signed   By: Richardean Sale M.D.   On: 12/07/2016 11:47    ASSESSMENT & PLAN:   # 66 year old male patient with chronic respiratory failure/ multiple malignancies most recent- SLL currently on Bendamustine status post cycle #1 approximately 4 days ago is currently in the hospital for "sepsis "  # Worsening respiratory status- currently on 4 L of oxygen; chest x-ray negative for any acute process. Question COPD exacerbation versus others. Agree with antibiotics for possible sepsis.  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 Hemoglobin approximately 9.6. S/p Bendamustine cycle #1 s/p Neulasta.   # Leukocytosis likely secondary to Neulasta; check a differential in the morning. Question infection. Agree with antibiotics. Await blood cultures.  Thank you Dr. Genia Harold for allowing me to participate in the care of your pleasant patient. Please do not  hesitate to contact me with questions or concerns in the interim.    [name], MD 12/07/2016 5:16 PM

## 2016-12-07 NOTE — Progress Notes (Signed)
Family Meeting Note  Advance Directive:no  Today a meeting took place with the Patient. And spouse    The following clinical team members were present during this meeting:MD  The following were discussed:Patient's diagnosis: Lymphocytic lymphoma, renal cell carcinoma and lung cancer Sepsis, Patient's progosis: Unable to determine and Goals for treatment: Full Code  Additional follow-up to be provided: Chaplain consultation for  Time spent during discussion: 17 minutes  Craig Boyson, MD

## 2016-12-07 NOTE — ED Notes (Signed)
Pt given meal tray. Admitting MD at bedside.

## 2016-12-07 NOTE — ED Notes (Signed)
Called lab to inquire about flu swab. States that they never received specimen. EDP informed. New specimen sent.

## 2016-12-07 NOTE — ED Notes (Signed)
Patient transported to x-ray. ?

## 2016-12-07 NOTE — ED Provider Notes (Signed)
[name] Provider Note  ____________________________________________  Time seen: Approximately 11:47 AM  I have reviewed the triage vital signs and the nursing notes.   HISTORY  Chief Complaint Nausea; Shortness of Breath; and Cough   HPI Craig [name]. is a 66 y.o. male a history of small lymphocytic lymphoma, renal cell cancer, and lung cancer currently on chemotherapy, COPD who presents for flu-like symptoms. Patient has had 2 days of progressively worsening shortness of breath, cough productive of yellow sputum, chills, low-grade fever, nausea, and decreased by mouth intake. Patient is on 4 L nasal cannula baseline with no new oxygen requirement. He is currently undergoing chemotherapy for his lung cancer. He denies chest pain. Denies vomiting or diarrhea, denies abdominal pain. He endorses body aches and a mild throbbing generalized headache. No sore throat.  Past Medical History:  Diagnosis Date  . Asthma   . Cancer ([place])   . Colon cancer ([place])   . COPD (chronic obstructive pulmonary disease) ([place])   . Diabetes mellitus without complication ([place])   . Difficulty sleeping   . Dysrhythmia   . GERD (gastroesophageal reflux disease)   . Glaucoma   . History of bronchitis   . Hypertension   . Lung cancer ([place])    right upper lobe mass positive for adenocarcinoma  . Lymphoma ([facility])    low grade  . Renal cancer ([place])   . Respiratory failure ([place]) 07/12/2010   acute  . Right renal mass   . Shortness of breath dyspnea   . Stroke ([place]) 1990'S   NO RESIDUAL PROBLEMS  . Supplemental oxygen dependent    USES 3 LITERS CONTINUOUSLY    Patient Active Problem List   Diagnosis Date Noted  . Portacath in place 11/05/2016  . Cancer associated pain 08/22/2016  . Anemia in neoplastic disease 08/22/2016  . Tachycardia 08/22/2016  . 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) ([place]) 08/22/2016  . Chronic bronchitis  ([place]) 06/02/2016  . Chronic renal insufficiency 06/02/2016  . Benign essential HTN 06/02/2016  . Diabetes ([place]) 06/02/2016  . Primary cancer of right upper lobe of lung ([place]) 06/01/2016  . Renal carcinoma ([place]) 02/29/2016  . Cancer of lung ([place]) 12/21/2015  . COPD exacerbation ([place]) 11/19/2015  . Acute bronchitis 11/19/2015  . SOB (shortness of breath) 11/19/2015  . Acute respiratory distress 11/19/2015  . Glaucoma 04/15/2015  . 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 BP (high blood pressure) 03/02/2015  . Cannot sleep 03/02/2015  . Controlled type 2 diabetes mellitus without complication ([place]) 98/92/1194  . Lung cancer, upper lobe ([place]) 02/03/2015  . Renal cell cancer ([place]) 02/03/2015  . Lymphoma, small lymphocytic ([place]) 02/03/2015    Past Surgical History:  Procedure Laterality Date  . IR GENERIC HISTORICAL  05/31/2016   IR RADIOLOGIST EVAL & MGMT 05/31/2016 Aletta Edouard, MD [facility] INTERV RAD    Prior to Admission medications   Medication Sig Start Date End Date Taking? Authorizing Provider  albuterol (PROVENTIL HFA;VENTOLIN HFA) 108 (90 BASE) MCG/ACT inhaler Inhale 2 puffs into the lungs every 6 (six) hours as needed for wheezing or shortness of breath.    Yes Historical Provider, MD  albuterol (PROVENTIL) (2.5 MG/3ML) 0.083% nebulizer solution USE 1 VIAL IN NEBULIZER EVERY 3-4 HOURS AS NEEDED 10/10/16  Yes [name], MD  budesonide-formoterol (SYMBICORT) 160-4.5 MCG/ACT inhaler Inhale 2 puffs into the lungs 2 (two) times daily.    Yes Historical Provider, MD  diphenhydrAMINE (BENADRYL) 25 MG tablet Take  2 tablets (50 mg total) by mouth as directed. Patient taking differently: Take 25 mg by mouth as needed for itching or allergies.  04/12/16  Yes Aletta Edouard, MD  glipiZIDE (GLUCOTROL) 5 MG tablet Take 5 mg by mouth daily before breakfast.   Yes Historical Provider, MD  HYDROcodone-acetaminophen (NORCO/VICODIN) 5-325 MG tablet Take 1 tablet by mouth every 6 (six) hours as needed  for moderate pain. Please Note new directions 12/03/16  Yes [name], MD  loperamide (IMODIUM A-D) 2 MG tablet Take 2 mg by mouth 4 (four) times daily as needed for diarrhea or loose stools. Reported on 12/21/2015   Yes Historical Provider, MD  losartan-hydrochlorothiazide (HYZAAR) 50-12.5 MG per tablet Take 1 tablet by mouth daily.   Yes Historical Provider, MD  pioglitazone (ACTOS) 30 MG tablet Take 30 mg by mouth daily.   Yes Historical Provider, MD  predniSONE (DELTASONE) 10 MG tablet TAKE 1 TABLET (10 MG TOTAL) BY MOUTH DAILY WITH BREAKFAST. 11/30/16  Yes [name], MD  temazepam (RESTORIL) 15 MG capsule Take 15 mg by mouth at bedtime as needed.  08/07/16  Yes Historical Provider, MD  tiotropium (SPIRIVA) 18 MCG inhalation capsule Place 18 mcg into inhaler and inhale daily.    Yes Historical Provider, MD  zolpidem (AMBIEN) 5 MG tablet Take 1 or 2 tabs at night if needed 11/26/16  Yes Historical Provider, MD  ibrutinib (IMBRUVICA) 140 MG capsul Take 3 capsules (420 mg total) by mouth daily. Patient not taking: Reported on 12/07/2016 10/30/16   [name], MD    Allergies Iodinated diagnostic agents and Nexium [esomeprazole magnesium]  Family History  Problem Relation Age of Onset  . Diabetes Mellitus II Mother   . Hypertension Mother   . Cancer Mother   . Heart disease Father   . Hypertension Father   . Tuberculosis Father   . Diabetes Mellitus II Sister   . Cancer Sister   . Prostate cancer Brother     Social History Social History  Substance Use Topics  . Smoking status: Former Smoker    Packs/day: 1.00    Years: 30.00    Types: Cigarettes    Start date: 12/28/1968    Quit date: 01/13/2010  . Smokeless tobacco: Never Used  . Alcohol use No     Comment: stopped 2002     Review of Systems  Constitutional: + low grade fever, chills, body aches, malaise Eyes: Negative for visual changes. ENT: Negative for sore throat. Neck: No neck pain    Cardiovascular: Negative for chest pain. Respiratory: + shortness of breath, productive cough Gastrointestinal: Negative for abdominal pain, vomiting or diarrhea. + nausea, decreased PO intake Genitourinary: Negative for dysuria. Musculoskeletal: Negative for back pain. Skin: Negative for rash. Neurological: Negative for headaches, weakness or numbness. Psych: No SI or HI  ____________________________________________   PHYSICAL EXAM:  VITAL SIGNS: ED Triage Vitals  Enc Vitals Group     BP 12/07/16 1124 (!) 135/47     Pulse Rate 12/07/16 1124 (!) 111     Resp 12/07/16 1124 (!) 29     Temp 12/07/16 1124 99.2 F (37.3 C)     Temp Source 12/07/16 1124 Oral     SpO2 12/07/16 1124 100 %     Weight 12/07/16 1125 145 lb (65.8 kg)     Height --      Head Circumference --      Peak Flow --      Pain Score 12/07/16 1126  0     Pain Loc --      Pain Edu? --      Excl. in [place]? --     Constitutional: Alert and oriented. Well appearing and in no apparent distress. HEENT:      Head: Normocephalic and atraumatic.         Eyes: Conjunctivae are normal. Sclera is non-icteric. EOMI. PERRL      Mouth/Throat: Mucous membranes are moist.       Neck: Supple with no signs of meningismus. Cardiovascular: Tachycardic with regular rhythm. No murmurs, gallops, or rubs. 2+ symmetrical distal pulses are present in all extremities. No JVD. Respiratory: Tachypneic, sating 100% on 4L. decreased air movement bilaterally with course rhonchi and expiratory wheezing. Gastrointestinal: Soft, non tender, and non distended with positive bowel sounds. No rebound or guarding. Musculoskeletal: Nontender with normal range of motion in all extremities. No edema, cyanosis, or erythema of extremities. Neurologic: Normal speech and language. Face is symmetric. Moving all extremities. No gross focal neurologic deficits are appreciated. Skin: Skin is warm, dry and intact. No rash noted. Psychiatric: Mood and affect are  normal. Speech and behavior are normal.  ____________________________________________   LABS (all labs ordered are listed, but only abnormal results are displayed)  Labs Reviewed  BASIC METABOLIC PANEL - Abnormal; Notable for the following:       Result Value   Chloride 92 (*)    CO2 40 (*)    Glucose, Bld 100 (*)    Calcium 8.5 (*)    All other components within normal limits  CBC - Abnormal; Notable for the following:    WBC 37.4 (*)    RBC 3.01 (*)    Hemoglobin 8.5 (*)    HCT 26.1 (*)    RDW 15.5 (*)    All other components within normal limits  URINALYSIS, COMPLETE (UACMP) WITH MICROSCOPIC - Abnormal; Notable for the following:    Color, Urine STRAW (*)    APPearance CLEAR (*)    Hgb urine dipstick SMALL (*)    All other components within normal limits  GLUCOSE, CAPILLARY - Abnormal; Notable for the following:    Glucose-Capillary 108 (*)    All other components within normal limits  CULTURE, BLOOD (ROUTINE X 2)  CULTURE, BLOOD (ROUTINE X 2)  URINE CULTURE  LACTIC ACID, PLASMA  INFLUENZA PANEL BY PCR (TYPE A & B)  CBG MONITORING, ED   ____________________________________________  EKG  ED ECG REPORT I, Rudene Re, the attending physician, personally viewed and interpreted this ECG.  Sinus tachycardia, rate of 112, normal intervals, right axis deviation, no ST elevations or depressions. Unchanged from prior ____________________________________________  RADIOLOGY  CXR:  Stable chest.  No acute cardiopulmonary process ____________________________________________   PROCEDURES  Procedure(s) performed: None Procedures Critical Care performed:  None ____________________________________________   INITIAL IMPRESSION / ASSESSMENT AND PLAN / ED COURSE  66 y.o. male a history of small lymphocytic lymphoma, renal cell cancer, and lung cancer currently on chemotherapy, COPD who presents for valuation of 2 days of productive cough, worsening shortness of  breath, wheezing, body aches, malaise, decreased appetite. Patient satting well on his baseline of 4 L, decreased air movement bilaterally with expiratory wheezes and coarse rhonchi. Patient is tachycardic, tachypnea, with a low-grade temp of 99.68F. will do chest x-ray to evaluate for pneumonia. We'll do a flu swab. We'll give IV fluids per sepsis protocol, start patient on broad-spectrum antibiotics for healthcare associated pneumonia including cefepime and vancomycin. We'll check basic blood  work including a lactate and blood cultures. We'll give duoneb 3 and solumedrol.   ED COURSE:  Chest x-ray with no infiltrate however patient meets criteria for pneumonia with fever, URI symptoms, and a white count of 37K. patient was given cefepime and vancomycin for healthcare associated pneumonia. Flu is negative. Shortness of breath improved and is now moving good air after 3 DuoNeb treatments. Patient be admitted to the hospitalist service for IV antibiotics.  Pertinent labs & imaging results that were available during my care of the patient were reviewed by me and considered in my medical decision making (see chart for details).    ____________________________________________   FINAL CLINICAL IMPRESSION(S) / ED DIAGNOSES  Final diagnoses:  HCAP (healthcare-associated pneumonia)  COPD exacerbation ([place])      NEW MEDICATIONS STARTED DURING THIS VISIT:  New Prescriptions   No medications on file     Note:  This document was prepared using Dragon voice recognition software and may include unintentional dictation errors.    Rudene Re, MD 12/07/16 1430

## 2016-12-07 NOTE — ED Triage Notes (Addendum)
Pt arrives to ER via [facility] from home with c/o nausea, cough and increasing SOB over last 2 days. Pt currenlty being treated for lung cancer, pt started a new chemo on Monday. Pt wears 4L [place] chronically. Pt received duoneb with [facility].   Pt had Neulasta applied and removed on Wednesday.

## 2016-12-07 NOTE — H&P (Signed)
[facility] [place] NAME: Craig [name]    MR#:  [id number]  DATE OF BIRTH:  January 24, 1951  DATE OF ADMISSION:  12/07/2016  PRIMARY CARE PHYSICIAN: [name], MD   REQUESTING/REFERRING PHYSICIAN: Dr. Alfred Levins  CHIEF COMPLAINT:   Body aches weakness HISTORY OF PRESENT ILLNESS:  Craig [name]  is a 66 y.o. 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 Patient received need chemotherapy and was doing fine on Monday and Tuesday however as mentioned since yesterday has had the symptoms. In the emergency room chest x-ray showed no evidence of pneumonia and influenza testing was negative. His CBC was elevated at 37. He was treated with cefepime and vancomycin for sepsis. Patient denies nausea, vomiting, chest.. He does state that over the past 24 hours he has also had wheezing. He wears 4 L of oxygen at home.  PAST MEDICAL HISTORY:   Past Medical History:  Diagnosis Date  . Asthma   . Cancer ([place])   . Colon cancer ([place])   . COPD (chronic obstructive pulmonary disease) ([place])   . Diabetes mellitus without complication ([place])   . Difficulty sleeping   . Dysrhythmia   . GERD (gastroesophageal reflux disease)   . Glaucoma   . History of bronchitis   . Hypertension   . Lung cancer ([place])    right upper lobe mass positive for adenocarcinoma  . Lymphoma ([facility])    low grade  . Renal cancer ([place])   . Respiratory failure ([place]) 07/12/2010   acute  . Right renal mass   . Shortness of breath dyspnea   . Stroke ([place]) 1990'S   NO RESIDUAL PROBLEMS  . Supplemental oxygen dependent    USES 3 LITERS CONTINUOUSLY    PAST SURGICAL HISTORY:   Past Surgical History:  Procedure Laterality Date  . IR GENERIC  HISTORICAL  05/31/2016   IR RADIOLOGIST EVAL & MGMT 05/31/2016 Aletta Edouard, MD [facility] INTERV RAD    SOCIAL HISTORY:   Social History  Substance Use Topics  . Smoking status: Former Smoker    Packs/day: 1.00    Years: 30.00    Types: Cigarettes    Start date: 12/28/1968    Quit date: 01/13/2010  . Smokeless tobacco: Never Used  . Alcohol use No     Comment: stopped 2002     FAMILY HISTORY:   Family History  Problem Relation Age of Onset  . Diabetes Mellitus II Mother   . Hypertension Mother   . Cancer Mother   . Heart disease Father   . Hypertension Father   . Tuberculosis Father   . Diabetes Mellitus II Sister   . Cancer Sister   . Prostate cancer Brother     DRUG ALLERGIES:   Allergies  Allergen Reactions  . Iodinated Diagnostic Agents Shortness Of Breath and Nausea And Vomiting    13-hour prep  . Nexium [Esomeprazole Magnesium] Other (See Comments)    headache    REVIEW OF SYSTEMS:   Review of Systems  Constitutional: Positive for chills and malaise/fatigue. Negative for fever.       Body aches  HENT: Negative.  Negative for ear discharge, ear pain, hearing loss, nosebleeds and sore throat.   Eyes: Negative.  Negative for blurred vision and pain.  Respiratory: Negative.  Negative for cough, hemoptysis, shortness of breath and wheezing.   Cardiovascular: Negative.  Negative for chest pain, palpitations and leg swelling.  Gastrointestinal: Negative.  Negative for abdominal pain, blood in stool, diarrhea, nausea and vomiting.  Genitourinary: Negative.  Negative for dysuria.  Musculoskeletal: Negative.  Negative for back pain.  Skin: Negative.   Neurological: Positive for weakness. Negative for dizziness, tremors, speech change, focal weakness, seizures and headaches.  Endo/Heme/Allergies: Negative.  Does not bruise/bleed easily.  Psychiatric/Behavioral: Negative.  Negative for depression, hallucinations and suicidal ideas.    MEDICATIONS AT HOME:   Prior  to Admission medications   Medication Sig Start Date End Date Taking? Authorizing Provider  albuterol (PROVENTIL HFA;VENTOLIN HFA) 108 (90 BASE) MCG/ACT inhaler Inhale 2 puffs into the lungs every 6 (six) hours as needed for wheezing or shortness of breath.    Yes Historical Provider, MD  albuterol (PROVENTIL) (2.5 MG/3ML) 0.083% nebulizer solution USE 1 VIAL IN NEBULIZER EVERY 3-4 HOURS AS NEEDED 10/10/16  Yes [name], MD  budesonide-formoterol (SYMBICORT) 160-4.5 MCG/ACT inhaler Inhale 2 puffs into the lungs 2 (two) times daily.    Yes Historical Provider, MD  diphenhydrAMINE (BENADRYL) 25 MG tablet Take 2 tablets (50 mg total) by mouth as directed. Patient taking differently: Take 25 mg by mouth as needed for itching or allergies.  04/12/16  Yes Aletta Edouard, MD  glipiZIDE (GLUCOTROL) 5 MG tablet Take 5 mg by mouth daily before breakfast.   Yes Historical Provider, MD  HYDROcodone-acetaminophen (NORCO/VICODIN) 5-325 MG tablet Take 1 tablet by mouth every 6 (six) hours as needed for moderate pain. Please Note new directions 12/03/16  Yes [name], MD  loperamide (IMODIUM A-D) 2 MG tablet Take 2 mg by mouth 4 (four) times daily as needed for diarrhea or loose stools. Reported on 12/21/2015   Yes Historical Provider, MD  losartan-hydrochlorothiazide (HYZAAR) 50-12.5 MG per tablet Take 1 tablet by mouth daily.   Yes Historical Provider, MD  pioglitazone (ACTOS) 30 MG tablet Take 30 mg by mouth daily.   Yes Historical Provider, MD  predniSONE (DELTASONE) 10 MG tablet TAKE 1 TABLET (10 MG TOTAL) BY MOUTH DAILY WITH BREAKFAST. 11/30/16  Yes [name], MD  temazepam (RESTORIL) 15 MG capsule Take 15 mg by mouth at bedtime as needed.  08/07/16  Yes Historical Provider, MD  tiotropium (SPIRIVA) 18 MCG inhalation capsule Place 18 mcg into inhaler and inhale daily.    Yes Historical Provider, MD  zolpidem (AMBIEN) 5 MG tablet Take 1 or 2 tabs at night if needed 11/26/16  Yes  Historical Provider, MD  ibrutinib (IMBRUVICA) 140 MG capsul Take 3 capsules (420 mg total) by mouth daily. Patient not taking: Reported on 12/07/2016 10/30/16   [name], MD      VITAL SIGNS:  Blood pressure (!) 118/55, pulse (!) 107, temperature 99.2 F (37.3 C), temperature source Oral, resp. rate (!) 30, weight 65.8 kg (145 lb), SpO2 100 %.  PHYSICAL EXAMINATION:   Physical Exam  Constitutional: He is oriented to person, place, and time and well-developed, well-nourished, and in no distress. No distress.  HENT:  Head: Normocephalic.  Eyes: No scleral icterus.  Neck: Normal range of motion. Neck supple. No JVD present. No tracheal deviation present.  Cardiovascular: Normal rate, regular rhythm and normal heart sounds.  Exam reveals no gallop and no friction rub.   No murmur heard. Pulmonary/Chest: Effort normal. No respiratory distress. He has wheezes. He has no rales. He  exhibits no tenderness.  Abdominal: Soft. Bowel sounds are normal. He exhibits no distension and no mass. There is no tenderness. There is no rebound and no guarding.  Musculoskeletal: Normal range of motion. He exhibits no edema.  Neurological: He is alert and oriented to person, place, and time.  Skin: Skin is warm. No rash noted. No erythema.  Psychiatric: Affect and judgment normal.      LABORATORY PANEL:   CBC  Recent Labs Lab 12/07/16 1135  WBC 37.4*  HGB 8.5*  HCT 26.1*  PLT 179   ------------------------------------------------------------------------------------------------------------------  Chemistries   Recent Labs Lab 12/03/16 0906 12/07/16 1135  NA 140 138  K 4.8 4.9  CL 98* 92*  CO2 35* 40*  GLUCOSE 143* 100*  BUN 22* 14  CREATININE 1.17 1.18  CALCIUM 8.9 8.5*  AST 18  --   ALT 14*  --   ALKPHOS 78  --   BILITOT 0.6  --    ------------------------------------------------------------------------------------------------------------------  Cardiac Enzymes No  results for input(s): TROPONINI in the last 168 hours. ------------------------------------------------------------------------------------------------------------------  RADIOLOGY:  Dg Chest 2 View  Result Date: 12/07/2016 CLINICAL DATA:  Nausea with cough and increasing shortness of breath for 2 days. Lung cancer. EXAM: CHEST  2 VIEW COMPARISON:  08/21/2016 and 07/02/2016. FINDINGS: Frontal exam was repeated. Left subclavian Port-A-Cath tip is unchanged at the lower SVC level. The heart size and mediastinal contours are stable. There is stable volume loss and linear scarring in the right upper lobe. The lungs are hyperinflated without airspace disease, edema or pleural effusion. Old rib fractures are present bilaterally. IMPRESSION: Stable chest.  No acute cardiopulmonary process. Electronically Signed   By: Richardean Sale M.D.   On: 12/07/2016 11:47    EKG:   Sinus tachycardia heart rate 112 without ST elevation or depression IMPRESSION AND PLAN:   66 year old male with a history of small lymphocytic lymphoma, renal cell carcinoma and lung cancer currently on chemotherapy who presents with body aches, cough, shortness of breath and wheezing.  1. Sepsis: Patient presents with leukocytosis, tachycardia and tachypnea Due to immuno suppressed state patient will be treated for influenza despite negative influenza testing. I will also continue cefepime and vancomycin. If MRSA PCR is negative then discontinue vancomycin Follow CBC and blood cultures Repeat chest x-ray a.m. to see if patient has a pneumonia that shows up after hydration. Continue IV fluids  2. Acute on chronic anemia likely from chemotherapy/underlying cancers Follow CBC  3. History of small lymphocytic lymphoma, renal cell carcinoma and lung cancer: Oncology consultation requested  4. Acute COPD exacerbation: Start IV steroids, DuoNeb's and inhalers  5. Essential hypertension: Continue Hyzaar  6. Diabetes: Hold oral  medications and start sliding scale insulin with ADA diet  All the records are reviewed and case discussed with ED provider. Management plans discussed with the patient and he is in agreement  CODE STATUS: FULL  TOTAL TIME TAKING CARE OF THIS PATIENT: 45  minutes.    Ewing Fandino M.D on 12/07/2016 at 2:43 PM  Between 7am to 6pm - Pager - [phone]  After 6pm go to [email or web address] - password [email or web address] [facility]  Office  [phone]  CC: Primary care physician; [name], MD

## 2016-12-08 ENCOUNTER — Inpatient Hospital Stay: Payer: Medicare HMO

## 2016-12-08 LAB — DIFFERENTIAL
BLASTS: 0 %
Band Neutrophils: 11 %
Basophils Absolute: 0 10*3/uL (ref 0–0.1)
Basophils Relative: 0 %
EOS PCT: 0 %
Eosinophils Absolute: 0.1 10*3/uL (ref 0–0.7)
LYMPHS PCT: 7 %
Lymphs Abs: 1.1 10*3/uL (ref 1.0–3.6)
MONO ABS: 1.9 10*3/uL — AB (ref 0.2–1.0)
MYELOCYTES: 2 %
Metamyelocytes Relative: 8 %
Monocytes Relative: 7 %
NRBC: 0 /100{WBCs}
Neutro Abs: 42.7 10*3/uL — ABNORMAL HIGH (ref 1.4–6.5)
Neutrophils Relative %: 65 %
OTHER: 0 %
PROMYELOCYTES ABS: 0 %

## 2016-12-08 LAB — CBC
HEMATOCRIT: 24.3 % — AB (ref 40.0–52.0)
Hemoglobin: 7.6 g/dL — ABNORMAL LOW (ref 13.0–18.0)
MCH: 27.2 pg (ref 26.0–34.0)
MCHC: 31.1 g/dL — ABNORMAL LOW (ref 32.0–36.0)
MCV: 87.3 fL (ref 80.0–100.0)
Platelets: 158 10*3/uL (ref 150–440)
RBC: 2.79 MIL/uL — ABNORMAL LOW (ref 4.40–5.90)
RDW: 15 % — AB (ref 11.5–14.5)
WBC: 45.9 10*3/uL — ABNORMAL HIGH (ref 3.8–10.6)

## 2016-12-08 LAB — GLUCOSE, CAPILLARY
GLUCOSE-CAPILLARY: 144 mg/dL — AB (ref 65–99)
GLUCOSE-CAPILLARY: 211 mg/dL — AB (ref 65–99)
Glucose-Capillary: 178 mg/dL — ABNORMAL HIGH (ref 65–99)
Glucose-Capillary: 128 mg/dL — ABNORMAL HIGH (ref 65–99)
Glucose-Capillary: 167 mg/dL — ABNORMAL HIGH (ref 65–99)

## 2016-12-08 LAB — BASIC METABOLIC PANEL
Anion gap: 5 (ref 5–15)
BUN: 18 mg/dL (ref 6–20)
CHLORIDE: 96 mmol/L — AB (ref 101–111)
CO2: 36 mmol/L — ABNORMAL HIGH (ref 22–32)
Calcium: 7.8 mg/dL — ABNORMAL LOW (ref 8.9–10.3)
Creatinine, Ser: 1.18 mg/dL (ref 0.61–1.24)
GFR calc Af Amer: >60 mL/min (ref >60)
GFR calc non Af Amer: >60 mL/min (ref >60)
GLUCOSE: 162 mg/dL — AB (ref 65–99)
POTASSIUM: 4.9 mmol/L (ref 3.5–5.1)
Sodium: 137 mmol/L (ref 135–145)

## 2016-12-08 LAB — URINE CULTURE: Culture: NO GROWTH

## 2016-12-08 MED ORDER — BUDESONIDE 0.5 MG/2ML IN SUSP
0.5000 mg | Freq: Two times a day (BID) | RESPIRATORY_TRACT | Status: DC
  Administered 2016-12-09 – 2016-12-10 (×3): 0.5 mg via RESPIRATORY_TRACT
  Filled 2016-12-08 (×4): qty 2

## 2016-12-08 MED ORDER — HYDROCHLOROTHIAZIDE 12.5 MG PO CAPS
12.5000 mg | ORAL_CAPSULE | Freq: Every day | ORAL | Status: DC
  Administered 2016-12-08 – 2016-12-10 (×3): 12.5 mg via ORAL
  Filled 2016-12-08 (×3): qty 1

## 2016-12-08 MED ORDER — CEFEPIME HCL 2 G IJ SOLR
2.0000 g | Freq: Two times a day (BID) | INTRAMUSCULAR | Status: DC
  Administered 2016-12-08 – 2016-12-09 (×3): 2 g via INTRAVENOUS
  Filled 2016-12-08 (×4): qty 2

## 2016-12-08 MED ORDER — LOSARTAN POTASSIUM 50 MG PO TABS
50.0000 mg | ORAL_TABLET | Freq: Every day | ORAL | Status: DC
  Administered 2016-12-08 – 2016-12-10 (×3): 50 mg via ORAL
  Filled 2016-12-08 (×3): qty 1

## 2016-12-08 MED ORDER — IPRATROPIUM-ALBUTEROL 0.5-2.5 (3) MG/3ML IN SOLN
3.0000 mL | RESPIRATORY_TRACT | Status: DC
  Administered 2016-12-09 (×2): 3 mL via RESPIRATORY_TRACT
  Filled 2016-12-08 (×2): qty 3

## 2016-12-08 MED ORDER — IPRATROPIUM-ALBUTEROL 0.5-2.5 (3) MG/3ML IN SOLN
3.0000 mL | Freq: Four times a day (QID) | RESPIRATORY_TRACT | Status: DC
  Administered 2016-12-08: 14:00:00 3 mL via RESPIRATORY_TRACT
  Filled 2016-12-08 (×2): qty 3

## 2016-12-08 NOTE — Progress Notes (Signed)
Pt reports he feels much better today. Refused oral diabetic med this a.m related to blood sugars dropping too low at home despite education with MD informed with order discontinued. Norco x 1 effective.

## 2016-12-08 NOTE — Evaluation (Signed)
Physical Therapy Evaluation Patient Details Name: Craig [name]. MRN: [id number] DOB: 1950/11/01 Today's Date: 12/08/2016   History of Present Illness  [place] Arch is a 66 y.o. male with a known history of upper lobe lung cancer, small lymphocytic lymphoma and renal cell carcinoma currently undergoing chemotherapy who presents with above complaint patient reports over the past 24 hours he has had body aches, generalized weakness, chills and feelings of dehydration. Patient received need chemotherapy and was doing fine on Monday and Tuesday however as mentioned since yesterday has had the symptoms. In the emergency room chest x-ray showed no evidence of pneumonia and influenza testing was negative. His CBC was elevated at 37. He was treated with cefepime and vancomycin for sepsis.  Clinical Impression  Pt admitted with above diagnosis. Pt currently with functional limitations due to the deficits listed below (see PT Problem List).  Pt is independent with bed mobility and modified independent with transfers to/from bed and commode. He demonstrates increased respiratory distress during ambulation with moderate DOE, increase in RR, and increased effort for breathing. He is notably more stable and reports higher confidence with UE support during ambulation. He would benefit from a rolling walker at discharge for fluctuations in strength/stability with chemo treatments and respiratory status. He would also benefit from [facility] PT for general strengthening, endurance training, and balance. Pt will benefit from skilled PT services to address deficits in strength, balance, and mobility in order to return to full function at home.       Follow Up Recommendations Home health PT    Equipment Recommendations  Rolling walker with 5" wheels;Other (comment) (Pt notably more confident with UE support during ambulation)    Recommendations for Other Services       Precautions / Restrictions  Precautions Precautions: Fall Restrictions Weight Bearing Restrictions: No      Mobility  Bed Mobility Overal bed mobility: Independent             General bed mobility comments: HOB minimally elevated, fair speed but slightly decreased, no need for bed rails  Transfers Overall transfer level: Modified independent Equipment used: None             General transfer comment: Increased time required to perform. Mildly unsteady in standing but able to self-correct. No need for UE support. Performed transfers to/from bed as well as BSC with patient  Ambulation/Gait Ambulation/Gait assistance: Min guard Ambulation Distance (Feet): 120 Feet Assistive device:  (IV pole) Gait Pattern/deviations: Decreased step length - right;Decreased step length - left Gait velocity: Decreased Gait velocity interpretation: <1.8 ft/sec, indicative of risk for recurrent falls General Gait Details: Pt ambulates with decreased step length and speed. Worsening DOE with progressive ambulation. Attempted ambulation without UE support but pt more confident with bilateral UE support on IV pole. HR increases to 120 bpm during ambulation and SaO2 drops to 94% on 4L/min O2. Increase in RR noted  Stairs            Wheelchair Mobility    Modified Rankin (Stroke Patients Only)       Balance Overall balance assessment: Needs assistance Sitting-balance support: No upper extremity supported Sitting balance-Leahy Scale: Normal     Standing balance support: No upper extremity supported Standing balance-Leahy Scale: Fair Standing balance comment: Able to maintain wide and narrow stance. With ambulation prefers UE support but able to ambulate relatively safely without UE support.  Pertinent Vitals/Pain Pain Assessment: No/denies pain    Home Living Family/patient expects to be discharged to:: Private residence Living Arrangements: Spouse/significant  other Available Help at Discharge: Family Type of Home: House Home Access: Stairs to enter Entrance Stairs-Rails: Right;Left;Can reach both Technical brewer of Steps: 2 Home Layout: One level Home Equipment: Shower seat - built in;Grab bars - tub/shower (Home O2, 3L/min, no walker)      Prior Function Level of Independence: Needs assistance   Gait / Transfers Assistance Needed: Household ambulator without assistive device. No falls in the last 12 months  ADL's / Homemaking Assistance Needed: Assist with ADLs/IADLs as needed from significant other.         Hand Dominance   Dominant Hand: Right    Extremity/Trunk Assessment   Upper Extremity Assessment Upper Extremity Assessment: Overall WFL for tasks assessed    Lower Extremity Assessment Lower Extremity Assessment: Generalized weakness (General deconditioning evident during ambulation)       Communication   Communication: No difficulties  Cognition Arousal/Alertness: Awake/alert Behavior During Therapy: WFL for tasks assessed/performed Overall Cognitive Status: Within Functional Limits for tasks assessed                      General Comments      Exercises     Assessment/Plan    PT Assessment Patient needs continued PT services  PT Problem List Decreased strength;Decreased activity tolerance;Decreased balance;Decreased mobility;Cardiopulmonary status limiting activity       PT Treatment Interventions DME instruction;Gait training;Stair training;Functional mobility training;Therapeutic activities;Balance training;Therapeutic exercise;Neuromuscular re-education;Patient/family education    PT Goals (Current goals can be found in the Care Plan section)  Acute Rehab PT Goals Patient Stated Goal: Return to prior level of function at home PT Goal Formulation: With patient Time For Goal Achievement: 12/22/16 Potential to Achieve Goals: Good    Frequency Min 2X/week   Barriers to discharge         Co-evaluation               End of Session Equipment Utilized During Treatment: Gait belt;Oxygen;Other (comment) (4L/min) Activity Tolerance: Patient tolerated treatment well Patient left: in bed;with call bell/phone within reach;Other (comment);with family/visitor present (Mod fall risk) Nurse Communication: Other (comment) (VSS at end of session. Advised to call RN if SOB continues) PT Visit Diagnosis: Unsteadiness on feet (R26.81);Muscle weakness (generalized) (M62.81)         Time: 5501-5868 PT Time Calculation (min) (ACUTE ONLY): 37 min   Charges:   PT Evaluation $PT Eval Low Complexity: 1 Procedure PT Treatments $Gait Training: 8-22 mins   PT G Codes:        Lyndel Safe Huprich PT, DPT   Huprich,Jason 12/08/2016, 9:33 AM

## 2016-12-08 NOTE — Progress Notes (Signed)
[facility] at [place] NAME: Craig [name]    MR#:  [id number]  DATE OF BIRTH:  01-17-1951  SUBJECTIVE:  CHIEF COMPLAINT:   Chief Complaint  Patient presents with  . Nausea  . Shortness of Breath  . Cough   Wheezing which is chronic  Afebrile  Chronic abd pain is unchanged  REVIEW OF SYSTEMS:    Review of Systems  Constitutional: Positive for malaise/fatigue. Negative for chills and fever.  HENT: Negative for sore throat.   Eyes: Negative for blurred vision, double vision and pain.  Respiratory: Positive for cough, shortness of breath and wheezing. Negative for hemoptysis.   Cardiovascular: Negative for chest pain, palpitations, orthopnea and leg swelling.  Gastrointestinal: Negative for abdominal pain, constipation, diarrhea, heartburn, nausea and vomiting.  Genitourinary: Negative for dysuria and hematuria.  Musculoskeletal: Negative for back pain and joint pain.  Skin: Negative for rash.  Neurological: Positive for weakness. Negative for sensory change, speech change, focal weakness and headaches.  Endo/Heme/Allergies: Does not bruise/bleed easily.  Psychiatric/Behavioral: Negative for depression. The patient is not nervous/anxious.     DRUG ALLERGIES:   Allergies  Allergen Reactions  . Iodinated Diagnostic Agents Shortness Of Breath and Nausea And Vomiting    13-hour prep  . Nexium [Esomeprazole Magnesium] Other (See Comments)    headache    VITALS:  Blood pressure 126/65, pulse 99, temperature 98.2 F (36.8 C), temperature source Oral, resp. rate 20, height '5\' 8"'$  (1.727 m), weight 65.3 kg (144 lb), SpO2 98 %.  PHYSICAL EXAMINATION:   Physical Exam  GENERAL:  66 y.o.-year-old patient lying in the bed with no acute distress.  EYES: Pupils equal, round, reactive to light and accommodation. No scleral icterus. Extraocular muscles intact.  HEENT: Head atraumatic, normocephalic. Oropharynx and nasopharynx clear.  NECK:   Supple, no jugular venous distention. No thyroid enlargement, no tenderness.  LUNGS: Bilateral wheezing CARDIOVASCULAR: S1, S2 normal. No murmurs, rubs, or gallops.  ABDOMEN: Soft, nontender, nondistended. Bowel sounds present. No organomegaly or mass.  EXTREMITIES: No cyanosis, clubbing or edema b/l.    NEUROLOGIC: Cranial nerves II through XII are intact. No focal Motor or sensory deficits b/l.   PSYCHIATRIC: The patient is alert and oriented x 3.  SKIN: No obvious rash, lesion, or ulcer.   LABORATORY PANEL:   CBC  Recent Labs Lab 12/08/16 0522  WBC 45.9*  HGB 7.6*  HCT 24.3*  PLT 158   ------------------------------------------------------------------------------------------------------------------ Chemistries   Recent Labs Lab 12/03/16 0906  12/08/16 0522  NA 140  < > 137  K 4.8  < > 4.9  CL 98*  < > 96*  CO2 35*  < > 36*  GLUCOSE 143*  < > 162*  BUN 22*  < > 18  CREATININE 1.17  < > 1.18  CALCIUM 8.9  < > 7.8*  AST 18  --   --   ALT 14*  --   --   ALKPHOS 78  --   --   BILITOT 0.6  --   --   < > = values in this interval not displayed. ------------------------------------------------------------------------------------------------------------------  Cardiac Enzymes No results for input(s): TROPONINI in the last 168 hours. ------------------------------------------------------------------------------------------------------------------  RADIOLOGY:  Dg Chest 2 View  Result Date: 12/07/2016 CLINICAL DATA:  Nausea with cough and increasing shortness of breath for 2 days. Lung cancer. EXAM: CHEST  2 VIEW COMPARISON:  08/21/2016 and 07/02/2016. FINDINGS: Frontal exam was repeated. Left subclavian Port-A-Cath tip is unchanged at  the lower SVC level. The heart size and mediastinal contours are stable. There is stable volume loss and linear scarring in the right upper lobe. The lungs are hyperinflated without airspace disease, edema or pleural effusion. Old rib fractures  are present bilaterally. IMPRESSION: Stable chest.  No acute cardiopulmonary process. Electronically Signed   By: Richardean Sale M.D.   On: 12/07/2016 11:47     ASSESSMENT AND PLAN:   66 year old male with a history of small lymphocytic lymphoma, renal cell carcinoma and lung cancer currently on chemotherapy who presents with body aches, cough, shortness of breath and wheezing.  * Sepsis likely due to Acute COPD exacerbation/bronchitis On IV abx and empiric Tamiflu. Bcx pending IV steroids, nebs Wean O2 as tolerated  * Acute on chronic anemia likely from chemotherapy/underlying cancers  * History of small lymphocytic lymphoma, renal cell carcinoma and lung cancer: Oncology input appreciated  * Essential hypertension: Continue Hyzaar  * Diabetes SSI Stop glipizide  All the records are reviewed and case discussed with Care Management/Social Workerr. Management plans discussed with the patient, family and they are in agreement.  CODE STATUS: FULL CODE  DVT Prophylaxis: SCDs  TOTAL TIME TAKING CARE OF THIS PATIENT: 35 minutes.   POSSIBLE D/C IN 1-2 DAYS, DEPENDING ON CLINICAL CONDITION.  Hillary Bow R M.D on 12/08/2016 at 11:13 AM  Between 7am to 6pm - Pager - [phone]  After 6pm go to [email or web address] - password [email or web address] [facility]  Office  [phone]  CC: Primary care physician; [name], MD  Note: This dictation was prepared with Dragon dictation along with smaller phrase technology. Any transcriptional errors that result from this process are unintentional.

## 2016-12-08 NOTE — Progress Notes (Signed)
Inpatient Diabetes Program Recommendations  AACE/ADA: New Consensus Statement on Inpatient Glycemic Control (2015)  Target Ranges:  Prepandial:   less than 140 mg/dL      Peak postprandial:   less than 180 mg/dL (1-2 hours)      Critically ill patients:  140 - 180 mg/dL   Lab Results  Component Value Date   GLUCAP 144 (H) 12/08/2016   HGBA1C 6.2 (H) 12/03/2016    Review of Glycemic Control  Results for Craig, [name] (MRN [id number]) as of 12/08/2016 09:29  Ref. Range 07/03/2016 11:07 12/07/2016 11:42 12/07/2016 18:18 12/07/2016 22:49 12/08/2016 07:32  Glucose-Capillary Latest Ref Range: 65 - 99 mg/dL 117 (H) 108 (H) 198 (H) 178 (H) 144 (H)    Diabetes history: Type 2 Outpatient Diabetes medications: Actos '30mg'$  qday, Glipizide '5mg'$  qam Current orders for Inpatient glycemic control: Novolog 0-9units tid, Novolog 0-5 units qhs, Glipizide '5mg'$  qam  * IV steroids '40mg'$  q12h  Inpatient Diabetes Program Recommendations:   Please consider d/c Glipizide while patient is in the hospital.   Gentry Fitz, RN, BA, MHA, CDE Diabetes Coordinator Inpatient Diabetes Program  [phone] (Team Pager) [phone] ([place]) 12/08/2016 9:32 AM

## 2016-12-09 LAB — CBC WITH DIFFERENTIAL/PLATELET
BASOS ABS: 0 10*3/uL (ref 0–0.1)
BASOS PCT: 0 %
Band Neutrophils: 12 %
Blasts: 0 %
Eosinophils Absolute: 0.6 10*3/uL (ref 0–0.7)
Eosinophils Relative: 1 %
HCT: 23.1 % — ABNORMAL LOW (ref 40.0–52.0)
Hemoglobin: 7.4 g/dL — ABNORMAL LOW (ref 13.0–18.0)
LYMPHS ABS: 2.5 10*3/uL (ref 1.0–3.6)
Lymphocytes Relative: 4 %
MCH: 27.3 pg (ref 26.0–34.0)
MCHC: 32 g/dL (ref 32.0–36.0)
MCV: 85.5 fL (ref 80.0–100.0)
METAMYELOCYTES PCT: 0 %
MONO ABS: 0.6 10*3/uL (ref 0.2–1.0)
MONOS PCT: 1 %
MYELOCYTES: 0 %
NEUTROS ABS: 58.4 10*3/uL — AB (ref 1.4–6.5)
NRBC: 0 /100{WBCs}
Neutrophils Relative %: 82 %
Other: 0 %
PLATELETS: 156 10*3/uL (ref 150–440)
Promyelocytes Absolute: 0 %
RBC: 2.7 MIL/uL — ABNORMAL LOW (ref 4.40–5.90)
RDW: 15.7 % — AB (ref 11.5–14.5)
WBC: 62.1 10*3/uL (ref 3.8–10.6)

## 2016-12-09 LAB — GLUCOSE, CAPILLARY
GLUCOSE-CAPILLARY: 121 mg/dL — AB (ref 65–99)
Glucose-Capillary: 128 mg/dL — ABNORMAL HIGH (ref 65–99)
Glucose-Capillary: 286 mg/dL — ABNORMAL HIGH (ref 65–99)
Glucose-Capillary: 120 mg/dL — ABNORMAL HIGH (ref 65–99)

## 2016-12-09 LAB — VANCOMYCIN, TROUGH: Vancomycin Tr: 24 ug/mL (ref 15–20)

## 2016-12-09 MED ORDER — VANCOMYCIN HCL IN DEXTROSE 1-5 GM/200ML-% IV SOLN
1000.0000 mg | INTRAVENOUS | Status: DC
  Filled 2016-12-09: qty 200

## 2016-12-09 MED ORDER — LEVOFLOXACIN 500 MG PO TABS
500.0000 mg | ORAL_TABLET | Freq: Every day | ORAL | Status: DC
  Administered 2016-12-09 – 2016-12-10 (×2): 500 mg via ORAL
  Filled 2016-12-09 (×2): qty 1

## 2016-12-09 MED ORDER — IPRATROPIUM-ALBUTEROL 0.5-2.5 (3) MG/3ML IN SOLN
3.0000 mL | Freq: Four times a day (QID) | RESPIRATORY_TRACT | Status: DC
  Administered 2016-12-09 – 2016-12-10 (×5): 3 mL via RESPIRATORY_TRACT
  Filled 2016-12-09 (×5): qty 3

## 2016-12-09 MED ORDER — SODIUM CHLORIDE 0.9% FLUSH
10.0000 mL | INTRAVENOUS | Status: DC | PRN
  Administered 2016-12-09 (×2): 10 mL via INTRAVENOUS
  Filled 2016-12-09 (×2): qty 10

## 2016-12-09 NOTE — Progress Notes (Signed)
Pharmacy Antibiotic Note  Craig Neal. is a 66 y.o. male admitted on 12/07/2016 with sepsis.  Pharmacy has been consulted for vancomycin/cefepime dosing.  Plan: Patient has a h/o lymphoma, renal cell cancer, and lung cancer w/ leukocytosis of 37.4 Patient received vancomycin 1g IV x 1 in ED @ 1224, will give another 750 mg dose to complete a 25 mg/kg load. Will initiate vancomycin 1g q12h to being 2/24 @ 0300 and will draw a trough prior to the 4th dose 2/25 @ 0200. Will draw trough sooner if renal function declines. Expected trough: 20.6 mg/L Ke 0.053 T1/2 12 hours CrCl 58 ml/min Will initiate Cefepime 2g IV q12h per CrCl < 60 ml/min   Height: '5\' 8"'$  (172.7 cm) Weight: 144 lb (65.3 kg) IBW/kg (Calculated) : 68.4  Temp (24hrs), Avg:98.1 F (36.7 C), Min:97.9 F (36.6 C), Max:98.2 F (36.8 C)   Recent Labs Lab 12/03/16 0906 12/07/16 1135 12/07/16 1149 12/08/16 0522 12/09/16 0217  WBC 7.2 37.4*  --  45.9*  --   CREATININE 1.17 1.18  --  1.18  --   LATICACIDVEN  --   --  1.3  --   --   VANCOTROUGH  --   --   --   --  24*    Estimated Creatinine Clearance: 57.6 mL/min (by C-G formula based on SCr of 1.18 mg/dL).    Allergies  Allergen Reactions  . Iodinated Diagnostic Agents Shortness Of Breath and Nausea And Vomiting    13-hour prep  . Nexium [Esomeprazole Magnesium] Other (See Comments)    headache    Antimicrobials this admission: 2/23 vanc >>  2/23 cfp >>   Dose adjustments this admission: Cefepime 2g q12h per CrCl < 60 ml/min  2/25 Vanc level 24. Changed to q 18 hours. Level before 3rd new dose.  Microbiology results: 2/23 BCx: sent 2/23 UCx: sent   Thank you for allowing pharmacy to be a part of this patient's care.  Tobie Lords, PharmD, BCPS Clinical Pharmacist 12/09/2016

## 2016-12-09 NOTE — Progress Notes (Signed)
CRITICAL VALUE ALERT  Critical value received:  WBC 62  Date of notification:  12/09/16  Time of notification:  0250   Critical value read back:Yes.    Nurse who received alert:  Luanna Salk   MD notified (1st page):  Hospitalist, Dr. Marcille Blanco  Time of first page:  0300  MD notified (2nd page):  Time of second page:  Responding MD:  Dr. Marcille Blanco  Time MD responded:  [email or web address].Hal  Notified MD of critical WBC; has been increasing since admission. Pt on Neulasta, nebs and solumedrol. No changes made. MD aware. Will continue to monitor. Jessee Avers

## 2016-12-09 NOTE — Progress Notes (Addendum)
Levaquin po started today.  Continues on same 02 and nebs treatments.  Reports feels better times and other times not as well.  Continues to have wheezes/diminished breath sounds. Family member at bedside most of day. Norco and tylenol given with midchest pain controlled per pt report.

## 2016-12-09 NOTE — Progress Notes (Signed)
[facility] [place] NAME: Rusell Meneely    MR#:  [id number]  DATE OF BIRTH:  05/29/1951  SUBJECTIVE:  CHIEF COMPLAINT:   Chief Complaint  Patient presents with  . Nausea  . Shortness of Breath  . Cough   Still has SOB and wheezing Afebrile  On 4 L O2  REVIEW OF SYSTEMS:    Review of Systems  Constitutional: Positive for malaise/fatigue. Negative for chills and fever.  HENT: Negative for sore throat.   Eyes: Negative for blurred vision, double vision and pain.  Respiratory: Positive for cough, shortness of breath and wheezing. Negative for hemoptysis.   Cardiovascular: Negative for chest pain, palpitations, orthopnea and leg swelling.  Gastrointestinal: Negative for abdominal pain, constipation, diarrhea, heartburn, nausea and vomiting.  Genitourinary: Negative for dysuria and hematuria.  Musculoskeletal: Negative for back pain and joint pain.  Skin: Negative for rash.  Neurological: Positive for weakness. Negative for sensory change, speech change, focal weakness and headaches.  Endo/Heme/Allergies: Does not bruise/bleed easily.  Psychiatric/Behavioral: Negative for depression. The patient is not nervous/anxious.     DRUG ALLERGIES:   Allergies  Allergen Reactions  . Iodinated Diagnostic Agents Shortness Of Breath and Nausea And Vomiting    13-hour prep  . Nexium [Esomeprazole Magnesium] Other (See Comments)    headache    VITALS:  Blood pressure 125/68, pulse (!) 103, temperature 98.9 F (37.2 C), temperature source Oral, resp. rate 20, height '5\' 8"'$  (1.727 m), weight 65.3 kg (144 lb), SpO2 100 %.  PHYSICAL EXAMINATION:   Physical Exam  GENERAL:  66 y.o.-year-old patient lying in the bed with no acute distress.  EYES: Pupils equal, round, reactive to light and accommodation. No scleral icterus. Extraocular muscles intact.  HEENT: Head atraumatic, normocephalic. Oropharynx and nasopharynx clear.  NECK:  Supple, no  jugular venous distention. No thyroid enlargement, no tenderness.  LUNGS: Bilateral wheezing CARDIOVASCULAR: S1, S2 normal. No murmurs, rubs, or gallops.  ABDOMEN: Soft, nontender, nondistended. Bowel sounds present. No organomegaly or mass.  EXTREMITIES: No cyanosis, clubbing or edema b/l.    NEUROLOGIC: Cranial nerves II through XII are intact. No focal Motor or sensory deficits b/l.   PSYCHIATRIC: The patient is alert and oriented x 3.  SKIN: No obvious rash, lesion, or ulcer.   LABORATORY PANEL:   CBC  Recent Labs Lab 12/09/16 0218  WBC 62.1*  HGB 7.4*  HCT 23.1*  PLT 156   ------------------------------------------------------------------------------------------------------------------ Chemistries   Recent Labs Lab 12/03/16 0906  12/08/16 0522  NA 140  < > 137  K 4.8  < > 4.9  CL 98*  < > 96*  CO2 35*  < > 36*  GLUCOSE 143*  < > 162*  BUN 22*  < > 18  CREATININE 1.17  < > 1.18  CALCIUM 8.9  < > 7.8*  AST 18  --   --   ALT 14*  --   --   ALKPHOS 78  --   --   BILITOT 0.6  --   --   < > = values in this interval not displayed. ------------------------------------------------------------------------------------------------------------------  Cardiac Enzymes No results for input(s): TROPONINI in the last 168 hours. ------------------------------------------------------------------------------------------------------------------  RADIOLOGY:  Dg Chest 2 View  Result Date: 12/07/2016 CLINICAL DATA:  Nausea with cough and increasing shortness of breath for 2 days. Lung cancer. EXAM: CHEST  2 VIEW COMPARISON:  08/21/2016 and 07/02/2016. FINDINGS: Frontal exam was repeated. Left subclavian Port-A-Cath tip is unchanged at  the lower SVC level. The heart size and mediastinal contours are stable. There is stable volume loss and linear scarring in the right upper lobe. The lungs are hyperinflated without airspace disease, edema or pleural effusion. Old rib fractures are  present bilaterally. IMPRESSION: Stable chest.  No acute cardiopulmonary process. Electronically Signed   By: Richardean Sale M.D.   On: 12/07/2016 11:47   Dg Chest Port 1 View  Result Date: 12/08/2016 CLINICAL DATA:  Shortness of breath. EXAM: PORTABLE CHEST 1 VIEW COMPARISON:  12/07/2016 FINDINGS: The cardiac silhouette is normal in size. No mediastinal or hilar masses. Lungs are hyperexpanded. There are fibrotic changes in the lung bases and in the right mid lung. Linear scarring extends laterally and superiorly from the right hilum. There is no evidence of pneumonia. No pleural effusion or pneumothorax. Skeletal structures are intact. Left anterior chest wall Port-A-Cath is stable. IMPRESSION: 1. No acute cardiopulmonary disease. 2. Stable changes of COPD with lung scarring. Electronically Signed   By: Lajean Manes M.D.   On: 12/08/2016 21:11     ASSESSMENT AND PLAN:   66 year old male with a history of small lymphocytic lymphoma, renal cell carcinoma and lung cancer currently on chemotherapy who presents with body aches, cough, shortness of breath and wheezing.  * Sepsis likely due to Acute COPD exacerbation/bronchitis and Acute on chronic resp failure On IV abx and empiric Tamiflu. Bcx NGTD IV steroids, nebs Wean O2 as tolerated  Stop vancomycin and cefepime. Start PO levaquin  * Acute on chronic anemia likely from chemotherapy/underlying cancers  * History of small lymphocytic lymphoma, renal cell carcinoma and lung cancer: Oncology input appreciated  * Essential hypertension: Continue Hyzaar  * Diabetes SSI Stopped glipizide  All the records are reviewed and case discussed with Care Management/Social Workerr. Management plans discussed with the patient, family and they are in agreement.  CODE STATUS: FULL CODE  DVT Prophylaxis: SCDs  TOTAL TIME TAKING CARE OF THIS PATIENT: 35 minutes.   POSSIBLE D/C tomorrow  DEPENDING ON CLINICAL CONDITION.  Hillary Bow R  M.D on 12/09/2016 at 11:35 AM  Between 7am to 6pm - Pager - [phone]  After 6pm go to [email or web address] - password [email or web address] [facility]  Office  [phone]  CC: Primary care physician; [name], MD  Note: This dictation was prepared with Dragon dictation along with smaller phrase technology. Any transcriptional errors that result from this process are unintentional.

## 2016-12-09 NOTE — Progress Notes (Addendum)
At around 2000,writer was called in to Patient's room. Seen Patient short of breath, on auscultation, noted for wheezing sounds on all lung fields. Patient stated he continues to have shortness of breath. He and Girlfriend demanded to contact Oncologist on call to inform about his current complaints/condition. Writer contacted Oncologist on call, spoke with Dr. Mike Gip, she gave a verbal order for stat chest x-ray. Chest Xray done, no significant findings except for COPD. Writer discussed patient's case with Hospitalist. Hospitalist ordered to stop current IVF. RT also administered due breathing treatments with some relief per patient's verbalization. Needs attended.

## 2016-12-10 DIAGNOSIS — R0602 Shortness of breath: Secondary | ICD-10-CM

## 2016-12-10 DIAGNOSIS — J9620 Acute and chronic respiratory failure, unspecified whether with hypoxia or hypercapnia: Secondary | ICD-10-CM

## 2016-12-10 DIAGNOSIS — R5383 Other fatigue: Secondary | ICD-10-CM

## 2016-12-10 DIAGNOSIS — J441 Chronic obstructive pulmonary disease with (acute) exacerbation: Secondary | ICD-10-CM

## 2016-12-10 LAB — ABO/RH: ABO/RH(D): O POS

## 2016-12-10 LAB — PATHOLOGIST SMEAR REVIEW

## 2016-12-10 LAB — PREPARE RBC (CROSSMATCH)

## 2016-12-10 LAB — GLUCOSE, CAPILLARY
GLUCOSE-CAPILLARY: 164 mg/dL — AB (ref 65–99)
Glucose-Capillary: 127 mg/dL — ABNORMAL HIGH (ref 65–99)

## 2016-12-10 MED ORDER — PREDNISONE 50 MG PO TABS: 50.0000 mg | ORAL_TABLET | Freq: Every day | ORAL | 0 refills | Status: DC

## 2016-12-10 MED ORDER — DIPHENHYDRAMINE HCL 25 MG PO CAPS
25.0000 mg | ORAL_CAPSULE | Freq: Once | ORAL | Status: AC
  Administered 2016-12-10: 12:00:00 25 mg via ORAL
  Filled 2016-12-10: qty 1

## 2016-12-10 MED ORDER — ACETAMINOPHEN 325 MG PO TABS
650.0000 mg | ORAL_TABLET | Freq: Once | ORAL | Status: AC
  Administered 2016-12-10: 650 mg via ORAL
  Filled 2016-12-10: qty 2

## 2016-12-10 MED ORDER — SODIUM CHLORIDE 0.9% FLUSH: 3.0000 mL | INTRAVENOUS | Status: DC | PRN

## 2016-12-10 MED ORDER — SODIUM CHLORIDE 0.9 % IV SOLN
250.0000 mL | Freq: Once | INTRAVENOUS | Status: AC
  Administered 2016-12-10: 250 mL via INTRAVENOUS

## 2016-12-10 MED ORDER — FUROSEMIDE 10 MG/ML IJ SOLN
20.0000 mg | Freq: Once | INTRAMUSCULAR | Status: AC
  Administered 2016-12-10: 20 mg via INTRAVENOUS
  Filled 2016-12-10: qty 2

## 2016-12-10 MED ORDER — SODIUM CHLORIDE 0.9% FLUSH: 10.0000 mL | INTRAVENOUS | Status: DC | PRN

## 2016-12-10 MED ORDER — HEPARIN SOD (PORK) LOCK FLUSH 100 UNIT/ML IV SOLN: 250.0000 [IU] | INTRAVENOUS | Status: DC | PRN

## 2016-12-10 MED ORDER — LEVOFLOXACIN 500 MG PO TABS: 500.0000 mg | ORAL_TABLET | Freq: Every day | ORAL | 0 refills | Status: DC

## 2016-12-10 MED ORDER — HEPARIN SOD (PORK) LOCK FLUSH 100 UNIT/ML IV SOLN
500.0000 [IU] | Freq: Every day | INTRAVENOUS | Status: DC | PRN
  Filled 2016-12-10: qty 5

## 2016-12-10 NOTE — Progress Notes (Signed)
Pt for discharge home. Alert. Dyspnea noted with increased exertion. Pt received 1 unit of packed red blood cells. tol well.  Lasix  Pre blood.

## 2016-12-10 NOTE — Discharge Instructions (Addendum)
Resume diet and activity as before.  Continue home oxygen.  Resume prednisone as before once new prescription course is over

## 2016-12-10 NOTE — Plan of Care (Signed)
Problem: Pain Managment: Goal: General experience of comfort will improve Outcome: Progressing norco  With decrease in pain

## 2016-12-10 NOTE — Care Management Important Message (Deleted)
Important Message  Patient Details  Name: Craig [name]. MRN: [id number] Date of Birth: 1951-07-04   Medicare Important Message Given:  Yes    Shelbie Ammons, RN 12/10/2016, 9:50 AM

## 2016-12-10 NOTE — Progress Notes (Signed)
Pt discharged at this time.  Instructions discussed with pt   meds / diet/ activity and f/u discussed.   Pt verbalize understanding left chest port flushed and deaccessed pre policy good blood return noted.Home via w/c  W/o c/o with girlfriend.

## 2016-12-10 NOTE — Care Management (Signed)
Admitted to [facility] with the diagnosis of sepsis. Craig [name], lives with him in the home. Sister is Dorian Heckle [phone]). Seen Dr. Lovie Macadamia last week. [name] in the past. No skilled facility. Home oxygen per [facility] since 2012. Prescriptions are filled at [place]. Nebulizer in the home. Takes care of all basic activities of daily living himself, doesn't drive. Friend does errands. No life Alert. No falls. Good appetite. Followed by [facility] in the past. Retired from the [employer], [employer] will transport. Physical therapy evaluation completed. Recommends Home with Home Health and physical therapy. Would like [facility] again. Shelbie Ammons RN MSN CCM Care Management

## 2016-12-10 NOTE — Care Management Important Message (Signed)
Important Message  Patient Details  Name: Craig [name]. MRN: [id number] Date of Birth: 03-Oct-1951   Medicare Important Message Given:  Yes    Shelbie Ammons, RN 12/10/2016, 9:50 AM

## 2016-12-10 NOTE — Plan of Care (Signed)
Problem: Tissue Perfusion: Goal: Risk factors for ineffective tissue perfusion will decrease Outcome: Progressing hgb  7.4 dr b saw pt this am.  Pt to receive 1 unit prbcs

## 2016-12-10 NOTE — Progress Notes (Signed)
Craig [name].   DOB:12/23/1950   TD#:[id number]    Subjective: Continues to have difficulty breathing; however slightly improved from yesterday. Complains of fatigue. No fever no chills. No hemoptysis. Chronic cough. No diarrhea.  Objective:  Vitals:   12/10/16 0424 12/10/16 0752  BP: 120/81 136/69  Pulse: (!) 105 (!) 102  Resp: (!) 24   Temp: 98.9 F (37.2 C) 98.7 F (37.1 C)     Intake/Output Summary (Last 24 hours) at 12/10/16 0920 Last data filed at 12/10/16 0800  Gross per 24 hour  Intake              290 ml  Output             1900 ml  Net            -1610 ml    GENERAL: Well-nourished well-developed; Alert, no distress and comfortable.   On oxygen. Accompanied by family.mild to Moderate respiratory distress.  EYES: no pallor or icterus OROPHARYNX: no thrush or ulceration. NECK: supple, no masses felt LYMPH:  no palpable lymphadenopathy in the cervical, axillary or inguinal regions LUNGS:  decreased breath sounds bilaterally.Positive for wheeze.  HEART/CVS: regular rate & rhythm and no murmurs; No lower extremity edema ABDOMEN: abdomen soft, non-tender and normal bowel sounds Musculoskeletal:no cyanosis of digits and no clubbing  PSYCH: alert & oriented x 3 with fluent speech NEURO: no focal motor/sensory deficits SKIN:  no rashes or significant lesions   Labs:  Lab Results  Component Value Date   WBC 62.1 (HH) 12/09/2016   HGB 7.4 (L) 12/09/2016   HCT 23.1 (L) 12/09/2016   MCV 85.5 12/09/2016   PLT 156 12/09/2016   NEUTROABS 58.4 (H) 12/09/2016    Lab Results  Component Value Date   NA 137 12/08/2016   K 4.9 12/08/2016   CL 96 (L) 12/08/2016   CO2 36 (H) 12/08/2016    Studies:  Dg Chest Port 1 View  Result Date: 12/08/2016 CLINICAL DATA:  Shortness of breath. EXAM: PORTABLE CHEST 1 VIEW COMPARISON:  12/07/2016 FINDINGS: The cardiac silhouette is normal in size. No mediastinal or hilar masses. Lungs are hyperexpanded. There are fibrotic changes in  the lung bases and in the right mid lung. Linear scarring extends laterally and superiorly from the right hilum. There is no evidence of pneumonia. No pleural effusion or pneumothorax. Skeletal structures are intact. Left anterior chest wall Port-A-Cath is stable. IMPRESSION: 1. No acute cardiopulmonary disease. 2. Stable changes of COPD with lung scarring. Electronically Signed   By: Lajean Manes M.D.   On: 12/08/2016 21:11    Assessment & Plan:   # 66 year old male patient with chronic respiratory failure/ multiple malignancies most recent- SLL currently on Bendamustine status post cycle #1 approximately 6 days ago is currently in the hospital for "sepsis "  # Worsening respiratory status- currently on 4 L of oxygen; chest x-ray negative for any acute process. Question COPD exacerbation versus others. Currently improved. Agree with continued by mouth antibiotics.   # Fatigue/difficulty breathing- hemoglobin 7.5. Recommend 1 unit of PRBC transfusion. Discussed pros and cons of transfusions the patient he agrees to transfusion.  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 Hemoglobin approximately 9.6. S/p Bendamustine cycle #1 s/p Neulasta.    # Leukocytosis likely secondary to Neulasta; blood cultures infectious workup negative so far.   # Plan reviewed with Dr.Sudini; okay to be discharged from hematology stand point after  PRBC transfusion.   [name], MD 12/10/2016  9:20 AM

## 2016-12-10 NOTE — Care Management (Signed)
Discharge to home today per Dr. Darvin Neighbours. Home Health information faxed to [facility]. Girl friend will transport. Shelbie Ammons RN MSN CCM Care Management

## 2016-12-11 LAB — TYPE AND SCREEN
ABO/RH(D): O POS
ANTIBODY SCREEN: NEGATIVE
Unit division: 0

## 2016-12-11 NOTE — Discharge Summary (Signed)
[facility] [place] NAME: Craig [name]    MR#:  [id number]  DATE OF BIRTH:  May 23, 1951  DATE OF ADMISSION:  12/07/2016 ADMITTING PHYSICIAN: Bettey Costa, MD  DATE OF DISCHARGE: 12/10/2016  5:40 PM  PRIMARY CARE PHYSICIAN: [name], MD   ADMISSION DIAGNOSIS:  COPD exacerbation ([place]) [J44.1] HCAP (healthcare-associated pneumonia) [J18.9]  DISCHARGE DIAGNOSIS:  Active Problems:   Sepsis ([place])   SECONDARY DIAGNOSIS:   Past Medical History:  Diagnosis Date  . Asthma   . Cancer ([place])   . Colon cancer ([place])   . COPD (chronic obstructive pulmonary disease) ([place])   . Diabetes mellitus without complication ([place])   . Difficulty sleeping   . Dysrhythmia   . GERD (gastroesophageal reflux disease)   . Glaucoma   . History of bronchitis   . Hypertension   . Lung cancer ([place])    right upper lobe mass positive for adenocarcinoma  . Lymphoma ([facility])    low grade  . Renal cancer ([place])   . Respiratory failure ([place]) 07/12/2010   acute  . Right renal mass   . Shortness of breath dyspnea   . Stroke ([place]) 1990'S   NO RESIDUAL PROBLEMS  . Supplemental oxygen dependent    USES 3 LITERS CONTINUOUSLY     ADMITTING HISTORY  HISTORY OF PRESENT ILLNESS:  Craig [name]  is a 66 y.o. 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 Patient received need chemotherapy and was doing fine on Monday and Tuesday however as mentioned since yesterday has had the symptoms. In the emergency room chest x-ray showed no evidence of pneumonia and influenza testing was negative. His CBC was elevated at 37. He was treated with cefepime and vancomycin for sepsis. Patient denies nausea, vomiting, chest.. He does state that over  the past 24 hours he has also had wheezing. He wears 4 L of oxygen at home.   HOSPITAL COURSE:   66 year old male with a history of small lymphocytic lymphoma, renal cell carcinoma and lung cancer currently on chemotherapy who presents with body aches, cough, shortness of breath and wheezing.  * Sepsis likely due to Acute COPD exacerbation/bronchitis and Acute on chronic resp failure Admitted for sepsis due to immunocompromise state. Started on broad-spectrum antibiotics. Blood cultures remained negative. He was treated with empiric Tamiflu in spite of his influenza A and B being negative. Patient's sepsis was most likely due to acute bronchitis. Treated with IV steroids, nebulizers and antibiotics. He is being switched to oral Levaquin on the day of discharge.  * Acute on chronic anemia likely from chemotherapy/underlying cancers Transfused 1 unit packed RBC on day of discharge as per his oncologist recommendations.  * History of small lymphocytic lymphoma, renal cell carcinoma and lung cancer: Oncology input appreciated  * Essential hypertension: Continue Hyzaar  * Diabetes SSI Stopped glipizide  Stable for discharge home. Patient will resume his activity and diet as before. Continue oxygen 3-4 L at home.  CONSULTS OBTAINED:  Treatment Team:  [name], MD  DRUG ALLERGIES:   Allergies  Allergen Reactions  . Iodinated Diagnostic Agents Shortness Of Breath and Nausea And Vomiting    13-hour prep  . Nexium [Esomeprazole Magnesium] Other (See Comments)    headache    DISCHARGE  MEDICATIONS:   Discharge Medication List as of 12/10/2016  4:39 PM    START taking these medications   Details  levofloxacin (LEVAQUIN) 500 MG tablet Take 1 tablet (500 mg total) by mouth daily., Starting Tue 12/11/2016, Normal    !! predniSONE (DELTASONE) 50 MG tablet Take 1 tablet (50 mg total) by mouth daily with breakfast., Starting Mon 12/10/2016, Normal     !! - Potential  duplicate medications found. Please discuss with provider.    CONTINUE these medications which have NOT CHANGED   Details  albuterol (PROVENTIL HFA;VENTOLIN HFA) 108 (90 BASE) MCG/ACT inhaler Inhale 2 puffs into the lungs every 6 (six) hours as needed for wheezing or shortness of breath. , Until Discontinued, Historical Med    albuterol (PROVENTIL) (2.5 MG/3ML) 0.083% nebulizer solution USE 1 VIAL IN NEBULIZER EVERY 3-4 HOURS AS NEEDED, Normal    budesonide-formoterol (SYMBICORT) 160-4.5 MCG/ACT inhaler Inhale 2 puffs into the lungs 2 (two) times daily. , Until Discontinued, Historical Med    diphenhydrAMINE (BENADRYL) 25 MG tablet Take 2 tablets (50 mg total) by mouth as directed., Starting Thu 04/12/2016, Phone In    glipiZIDE (GLUCOTROL) 5 MG tablet Take 5 mg by mouth daily before breakfast., Historical Med    HYDROcodone-acetaminophen (NORCO/VICODIN) 5-325 MG tablet Take 1 tablet by mouth every 6 (six) hours as needed for moderate pain. Please Note new directions, Starting Mon 12/03/2016, Print    loperamide (IMODIUM A-D) 2 MG tablet Take 2 mg by mouth 4 (four) times daily as needed for diarrhea or loose stools. Reported on 12/21/2015, Until Discontinued, Historical Med    losartan-hydrochlorothiazide (HYZAAR) 50-12.5 MG per tablet Take 1 tablet by mouth daily., Until Discontinued, Historical Med    pioglitazone (ACTOS) 30 MG tablet Take 30 mg by mouth daily., Until Discontinued, Historical Med    !! predniSONE (DELTASONE) 10 MG tablet TAKE 1 TABLET (10 MG TOTAL) BY MOUTH DAILY WITH BREAKFAST., Normal    temazepam (RESTORIL) 15 MG capsule Take 15 mg by mouth at bedtime as needed. , Starting Tue 08/07/2016, Historical Med    tiotropium (SPIRIVA) 18 MCG inhalation capsule Place 18 mcg into inhaler and inhale daily. , Until Discontinued, Historical Med    zolpidem (AMBIEN) 5 MG tablet Take 1 or 2 tabs at night if needed, Historical Med    ibrutinib (IMBRUVICA) 140 MG capsul Take 3 capsules  (420 mg total) by mouth daily., Starting Tue 10/30/2016, Print     !! - Potential duplicate medications found. Please discuss with provider.      Today   VITAL SIGNS:  Blood pressure 127/77, pulse (!) 111, temperature 98.8 F (37.1 C), temperature source Oral, resp. rate (!) 24, height '5\' 8"'$  (1.727 m), weight 65.3 kg (144 lb), SpO2 100 %.  I/O:   Intake/Output Summary (Last 24 hours) at 12/11/16 1230 Last data filed at 12/10/16 1540  Gross per 24 hour  Intake              440 ml  Output                0 ml  Net              440 ml    PHYSICAL EXAMINATION:  Physical Exam  GENERAL:  66 y.o.-year-old patient lying in the bed with no acute distress.  LUNGS: Normal work of breathing with mild expiratory wheezes CARDIOVASCULAR: S1, S2 normal. No murmurs, rubs, or gallops.  ABDOMEN: Soft, non-tender, non-distended. Bowel sounds present. No organomegaly  or mass.  NEUROLOGIC: Moves all 4 extremities. PSYCHIATRIC: The patient is alert and oriented x 3.  SKIN: No obvious rash, lesion, or ulcer.   DATA REVIEW:   CBC  Recent Labs Lab 12/09/16 0218  WBC 62.1*  HGB 7.4*  HCT 23.1*  PLT 156    Chemistries   Recent Labs Lab 12/08/16 0522  NA 137  K 4.9  CL 96*  CO2 36*  GLUCOSE 162*  BUN 18  CREATININE 1.18  CALCIUM 7.8*    Cardiac Enzymes No results for input(s): TROPONINI in the last 168 hours.  Microbiology Results  Results for orders placed or performed during the hospital encounter of 12/07/16  Blood Culture (routine x 2)     Status: None (Preliminary result)   Collection Time: 12/07/16 11:49 AM  Result Value Ref Range Status   Specimen Description BLOOD RIGHT FOREARM  Final   Special Requests   Final    BOTTLES DRAWN AEROBIC AND ANAEROBIC ANA10ML AER13ML   Culture NO GROWTH 4 DAYS  Final   Report Status PENDING  Incomplete  Blood Culture (routine x 2)     Status: None (Preliminary result)   Collection Time: 12/07/16 11:49 AM  Result Value Ref Range  Status   Specimen Description BLOOD RIGHT ASSIST CONTROL  Final   Special Requests   Final    BOTTLES DRAWN AEROBIC AND ANAEROBIC ANA10ML AER14ML   Culture NO GROWTH 4 DAYS  Final   Report Status PENDING  Incomplete  Urine culture     Status: None   Collection Time: 12/07/16 12:09 PM  Result Value Ref Range Status   Specimen Description URINE, RANDOM  Final   Special Requests NONE  Final   Culture   Final    NO GROWTH Performed at [facility], [place] [address], [place], [place] [address]    Report Status 12/08/2016 FINAL  Final    RADIOLOGY:  No results found.  Follow up with PCP in 1 week.  Management plans discussed with the patient, family and they are in agreement.  CODE STATUS:  Code Status History    Date Active Date Inactive Code Status Order ID Comments User Context   12/07/2016  4:43 PM 12/10/2016  8:50 PM Full Code [id number]  Bettey Costa, MD Inpatient   07/02/2016  3:01 PM 07/03/2016  3:55 PM DNR [id number]  Hillary Bow, MD ED   05/31/2016  6:48 PM 06/02/2016  3:41 PM DNR [id number]  Nicholes Mango, MD Inpatient   11/19/2015  7:53 PM 11/20/2015  2:12 PM Full Code [id number]  Idelle Crouch, MD Inpatient      TOTAL TIME TAKING CARE OF THIS PATIENT ON DAY OF DISCHARGE: more than 30 minutes.   Hillary Bow R M.D on 12/11/2016 at 12:30 PM  Between 7am to 6pm - Pager - [phone]  After 6pm go to [email or web address] - password [email or web address] [facility]  Office  [phone]  CC: Primary care physician; [name], MD  Note: This dictation was prepared with Dragon dictation along with smaller phrase technology. Any transcriptional errors that result from this process are unintentional.

## 2016-12-12 LAB — CULTURE, BLOOD (ROUTINE X 2)
Culture: NO GROWTH
Culture: NO GROWTH

## 2016-12-17 ENCOUNTER — Inpatient Hospital Stay: Payer: Medicare HMO | Attending: Internal Medicine

## 2016-12-17 DIAGNOSIS — K219 Gastro-esophageal reflux disease without esophagitis: Secondary | ICD-10-CM | POA: Diagnosis not present

## 2016-12-17 DIAGNOSIS — Z85028 Personal history of other malignant neoplasm of stomach: Secondary | ICD-10-CM | POA: Insufficient documentation

## 2016-12-17 DIAGNOSIS — E119 Type 2 diabetes mellitus without complications: Secondary | ICD-10-CM | POA: Insufficient documentation

## 2016-12-17 DIAGNOSIS — Z87891 Personal history of nicotine dependence: Secondary | ICD-10-CM | POA: Diagnosis not present

## 2016-12-17 DIAGNOSIS — C641 Malignant neoplasm of right kidney, except renal pelvis: Secondary | ICD-10-CM | POA: Diagnosis not present

## 2016-12-17 DIAGNOSIS — Z79899 Other long term (current) drug therapy: Secondary | ICD-10-CM | POA: Diagnosis not present

## 2016-12-17 DIAGNOSIS — Z8042 Family history of malignant neoplasm of prostate: Secondary | ICD-10-CM | POA: Diagnosis not present

## 2016-12-17 DIAGNOSIS — R079 Chest pain, unspecified: Secondary | ICD-10-CM | POA: Diagnosis not present

## 2016-12-17 DIAGNOSIS — C8588 Other specified types of non-Hodgkin lymphoma, lymph nodes of multiple sites: Secondary | ICD-10-CM | POA: Diagnosis not present

## 2016-12-17 DIAGNOSIS — I10 Essential (primary) hypertension: Secondary | ICD-10-CM | POA: Insufficient documentation

## 2016-12-17 DIAGNOSIS — N189 Chronic kidney disease, unspecified: Secondary | ICD-10-CM | POA: Insufficient documentation

## 2016-12-17 DIAGNOSIS — C3411 Malignant neoplasm of upper lobe, right bronchus or lung: Secondary | ICD-10-CM | POA: Insufficient documentation

## 2016-12-17 DIAGNOSIS — Z9221 Personal history of antineoplastic chemotherapy: Secondary | ICD-10-CM | POA: Insufficient documentation

## 2016-12-17 DIAGNOSIS — Z8673 Personal history of transient ischemic attack (TIA), and cerebral infarction without residual deficits: Secondary | ICD-10-CM | POA: Diagnosis not present

## 2016-12-17 DIAGNOSIS — C8300 Small cell B-cell lymphoma, unspecified site: Secondary | ICD-10-CM

## 2016-12-17 DIAGNOSIS — Z9981 Dependence on supplemental oxygen: Secondary | ICD-10-CM | POA: Diagnosis not present

## 2016-12-17 DIAGNOSIS — J449 Chronic obstructive pulmonary disease, unspecified: Secondary | ICD-10-CM | POA: Insufficient documentation

## 2016-12-17 DIAGNOSIS — Z809 Family history of malignant neoplasm, unspecified: Secondary | ICD-10-CM | POA: Insufficient documentation

## 2016-12-17 DIAGNOSIS — I129 Hypertensive chronic kidney disease with stage 1 through stage 4 chronic kidney disease, or unspecified chronic kidney disease: Secondary | ICD-10-CM | POA: Insufficient documentation

## 2016-12-17 DIAGNOSIS — R0602 Shortness of breath: Secondary | ICD-10-CM | POA: Diagnosis not present

## 2016-12-17 LAB — CBC WITH DIFFERENTIAL/PLATELET
BASOS ABS: 0 10*3/uL (ref 0–0.1)
BASOS PCT: 0 %
Eosinophils Absolute: 0.2 10*3/uL (ref 0–0.7)
Eosinophils Relative: 2 %
HEMATOCRIT: 31.2 % — AB (ref 40.0–52.0)
HEMOGLOBIN: 9.9 g/dL — AB (ref 13.0–18.0)
Lymphocytes Relative: 2 %
Lymphs Abs: 0.3 10*3/uL — ABNORMAL LOW (ref 1.0–3.6)
MCH: 27.3 pg (ref 26.0–34.0)
MCHC: 31.5 g/dL — ABNORMAL LOW (ref 32.0–36.0)
MCV: 86.6 fL (ref 80.0–100.0)
Monocytes Absolute: 0.5 10*3/uL (ref 0.2–1.0)
Monocytes Relative: 3 %
NEUTROS ABS: 13.7 10*3/uL — AB (ref 1.4–6.5)
NEUTROS PCT: 93 %
Platelets: 216 10*3/uL (ref 150–440)
RBC: 3.61 MIL/uL — AB (ref 4.40–5.90)
RDW: 14.7 % — AB (ref 11.5–14.5)
WBC: 14.8 10*3/uL — AB (ref 3.8–10.6)

## 2016-12-17 LAB — BASIC METABOLIC PANEL
ANION GAP: 8 (ref 5–15)
BUN: 20 mg/dL (ref 6–20)
CALCIUM: 7.8 mg/dL — AB (ref 8.9–10.3)
CHLORIDE: 93 mmol/L — AB (ref 101–111)
CO2: 40 mmol/L — AB (ref 22–32)
Creatinine, Ser: 1.14 mg/dL (ref 0.61–1.24)
GFR calc non Af Amer: >60 mL/min (ref >60)
Glucose, Bld: 186 mg/dL — ABNORMAL HIGH (ref 65–99)
POTASSIUM: 4.4 mmol/L (ref 3.5–5.1)
Sodium: 141 mmol/L (ref 135–145)

## 2016-12-18 DIAGNOSIS — C649 Malignant neoplasm of unspecified kidney, except renal pelvis: Secondary | ICD-10-CM | POA: Diagnosis not present

## 2016-12-18 DIAGNOSIS — J441 Chronic obstructive pulmonary disease with (acute) exacerbation: Secondary | ICD-10-CM | POA: Diagnosis not present

## 2016-12-18 DIAGNOSIS — C8300 Small cell B-cell lymphoma, unspecified site: Secondary | ICD-10-CM | POA: Diagnosis not present

## 2016-12-18 DIAGNOSIS — H409 Unspecified glaucoma: Secondary | ICD-10-CM | POA: Diagnosis not present

## 2016-12-18 DIAGNOSIS — M6281 Muscle weakness (generalized): Secondary | ICD-10-CM | POA: Diagnosis not present

## 2016-12-18 DIAGNOSIS — C3410 Malignant neoplasm of upper lobe, unspecified bronchus or lung: Secondary | ICD-10-CM | POA: Diagnosis not present

## 2016-12-18 DIAGNOSIS — E119 Type 2 diabetes mellitus without complications: Secondary | ICD-10-CM | POA: Diagnosis not present

## 2016-12-18 DIAGNOSIS — I10 Essential (primary) hypertension: Secondary | ICD-10-CM | POA: Diagnosis not present

## 2016-12-18 DIAGNOSIS — D649 Anemia, unspecified: Secondary | ICD-10-CM | POA: Diagnosis not present

## 2016-12-24 ENCOUNTER — Inpatient Hospital Stay: Payer: Medicare HMO

## 2016-12-24 ENCOUNTER — Inpatient Hospital Stay: Payer: Medicare HMO | Admitting: Internal Medicine

## 2016-12-24 NOTE — Assessment & Plan Note (Deleted)
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 Hemoglobin approximately 9.6. Discontinued ibrutinib sec to financial issues.   # Proceed with Craig [name] reduced to 70 mg/m day 1 and day 2]- Rituxan cycle #1 today; hold rituximab for the first 2 cycles.   # Growth factor-Neulasta/On pro would be given as prophylaxis for chemotherapy-induced neutropenia to prevent febrile neutropenias.   # Pain- chest when coughing- continue pain meds prn. New script given.   # Advanced COPD 3 L of oxygen- discussed the potential infusion reactions with Rituxan.  # CKD creatinine 1.2-1.3. This has to be monitored closely with bendamustine. Also cut the dose of bendamustine.  # follow up with me in 4 weeks/labs; bendamustine. Lab in 2 weeks.

## 2016-12-24 NOTE — Progress Notes (Deleted)
[place] OFFICE PROGRESS NOTE  Patient Care Team: [name], MD as PCP - General (Family Medicine)  Cancer Staging Lung cancer, upper lobe [facility]) Staging form: Lung, AJCC 7th Edition - Clinical stage from 06/15/2010: yT2, N2, M0 - Signed by [name], NP on 02/03/2015 - Pathologic stage from 04/04/2014: yT2, N2, M1 - Signed by [name], NP on 02/03/2015  Lymphoma, small lymphocytic ([place]) Staging form: Lymphoid Neoplasms, AJCC 6th Edition - Clinical stage from 10/15/2013: Stage II - Signed by [name], NP on 02/03/2015  Renal cell cancer [facility]) Staging form: Kidney, AJCC 7th Edition - Clinical stage from 02/03/2015: Stage I (yT1a, N0, M0) - Signed by [name], NP on 02/03/2015    Oncology History   1. Right upper lobe lung mass biopsies positive for adenocarcinoma; PET- T2, N2, M0;  stage IIIA;  carboplatinum Alimta and Avastin;  [april  9th of 2013]  Patient had an allergic reaction to carboplatinum with acute bronchospasm in May of 2013; carbo-discontinued.; Maintenance Alimta and Avastin  from June of 2013 hold chemotherapy because of increasing shortness of breath (in July, 2013); VARISTRAT-GOOD; Started on [place] February 3 about 2014; Diarrhea 4 days after Tarceva was started.  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   # SEP 2017- GAZYVA x1 [stopped sec to respi issues]  # Nov 1st week- START Ibrutinib 2 pills day; Last Jan 22nd 2018 [sec to co-pay].   # FEB 19th- cycle #1 Bendamustine [day-1& 2- '70mg'$ /m2; Rituxan with cycle #2  # RIGHT KIDNEY CA- RCC [s/p Bx]- s/p Cryo  [Dr.Yamagat; IR; 2016]   #  Poor pulmonary function; 3 L home O2.     Lung cancer, upper lobe ([place])   02/03/2015 Initial Diagnosis    Lung cancer, upper lobe       Lymphoma, small lymphocytic ([place])   02/03/2015 Initial Diagnosis    Lymphoma, small lymphocytic (HCC)      Cancer of lung ([place])   12/21/2015 Initial Diagnosis    Cancer of lung ([place])      Primary cancer of right upper lobe of lung ([place])   06/01/2016 Initial Diagnosis    Primary cancer of right upper lobe of lung (H[facility])       INTERVAL HISTORY:  Craig [name]. 66 y.o.  male pleasant patientHistory of chronic respiratory failure on home O2 with above history of Multiple malignancies- most recently with progressive SLL- Patient ran out of his Assistance for his ibrutinib.  He denies any lumps or bumps.  Patient continues to have pain/ chronic chest wall and also abdominal pain for which he is on narcotic pain medication. He denies any weight loss. Denies any night sweats. His chronic shortness of breath on 3 L of oxygen; Currently breathing is at baseline. Complains of chronic cough. Denies any fevers or chills.  REVIEW OF SYSTEMS:  A complete 10 point review of system is done which is negative except mentioned above/history of present illness.   PAST MEDICAL HISTORY :  Past Medical History:  Diagnosis Date  . Asthma   .  Cancer ([place])   . Colon cancer ([place])   . COPD (chronic obstructive pulmonary disease) ([place])   . Diabetes mellitus without complication ([place])   . Difficulty sleeping   . Dysrhythmia   . GERD (gastroesophageal reflux disease)   . Glaucoma   . History of bronchitis   . Hypertension   . Lung cancer ([place])    right upper lobe mass positive for adenocarcinoma  . Lymphoma (H[facility])    low grade  . Renal cancer ([place])   . Respiratory failure ([place]) 07/12/2010   acute  . Right renal mass   . Shortness of breath dyspnea   . Stroke ([place]) 1990'S   NO RESIDUAL PROBLEMS  . Supplemental oxygen dependent     USES 3 LITERS CONTINUOUSLY    PAST SURGICAL HISTORY :   Past Surgical History:  Procedure Laterality Date  . IR GENERIC HISTORICAL  05/31/2016   IR RADIOLOGIST EVAL & MGMT 05/31/2016 Aletta Edouard, MD [facility] INTERV RAD    FAMILY HISTORY :   Family History  Problem Relation Age of Onset  . Diabetes Mellitus II Mother   . Hypertension Mother   . Cancer Mother   . Heart disease Father   . Hypertension Father   . Tuberculosis Father   . Diabetes Mellitus II Sister   . Cancer Sister   . Prostate cancer Brother     SOCIAL HISTORY:   Social History  Substance Use Topics  . Smoking status: Former Smoker    Packs/day: 1.00    Years: 30.00    Types: Cigarettes    Start date: 12/28/1968    Quit date: 01/13/2010  . Smokeless tobacco: Never Used  . Alcohol use No     Comment: stopped 2002     ALLERGIES:  is allergic to iodinated diagnostic agents and nexium [esomeprazole magnesium].  MEDICATIONS:  Current Outpatient Prescriptions  Medication Sig Dispense Refill  . albuterol (PROVENTIL HFA;VENTOLIN HFA) 108 (90 BASE) MCG/ACT inhaler Inhale 2 puffs into the lungs every 6 (six) hours as needed for wheezing or shortness of breath.     [place] Kitchen albuterol (PROVENTIL) (2.5 MG/3ML) 0.083% nebulizer solution USE 1 VIAL IN NEBULIZER EVERY 3-4 HOURS AS NEEDED 75 mL 3  . budesonide-formoterol (SYMBICORT) 160-4.5 MCG/ACT inhaler Inhale 2 puffs into the lungs 2 (two) times daily.     . diphenhydrAMINE (BENADRYL) 25 MG tablet Take 2 tablets (50 mg total) by mouth as directed. (Patient taking differently: Take 25 mg by mouth as needed for itching or allergies. ) 2 tablet 0  . glipiZIDE (GLUCOTROL) 5 MG tablet Take 5 mg by mouth daily before breakfast.    . HYDROcodone-acetaminophen (NORCO/VICODIN) 5-325 MG tablet Take 1 tablet by mouth every 6 (six) hours as needed for moderate pain. Please Note new directions 120 tablet 0  . ibrutinib (IMBRUVICA) 140 MG capsul Take 3 capsules (420 mg total) by mouth  daily. (Patient not taking: Reported on 12/07/2016) 90 capsule 6  . levofloxacin (LEVAQUIN) 500 MG tablet Take 1 tablet (500 mg total) by mouth daily. 4 tablet 0  . loperamide (IMODIUM A-D) 2 MG tablet Take 2 mg by mouth 4 (four) times daily as needed for diarrhea or loose stools. Reported on 12/21/2015    . losartan-hydrochlorothiazide (HYZAAR) 50-12.5 MG per tablet Take 1 tablet by mouth daily.    . pioglitazone (ACTOS) 30 MG tablet Take 30 mg by mouth daily.    . predniSONE (DELTASONE) 10 MG tablet TAKE 1 TABLET (10 MG TOTAL) BY MOUTH  DAILY WITH BREAKFAST. 30 tablet 3  . predniSONE (DELTASONE) 50 MG tablet Take 1 tablet (50 mg total) by mouth daily with breakfast. 4 tablet 0  . temazepam (RESTORIL) 15 MG capsule Take 15 mg by mouth at bedtime as needed.     . tiotropium (SPIRIVA) 18 MCG inhalation capsule Place 18 mcg into inhaler and inhale daily.     [place] Kitchen zolpidem (AMBIEN) 5 MG tablet Take 1 or 2 tabs at night if needed     No current facility-administered medications for this visit.    Facility-Administered Medications Ordered in Other Visits  Medication Dose Route Frequency Provider Last Rate Last Dose  . sodium chloride 0.9 % injection 10 mL  10 mL Intracatheter PRN Forest Gleason, MD   10 mL at 04/28/15 1410  . sodium chloride 0.9 % injection 10 mL  10 mL Intravenous PRN Forest Gleason, MD   10 mL at 05/12/15 1350    PHYSICAL EXAMINATION: ECOG PERFORMANCE STATUS: 2 - Symptomatic, <50% confined to bed  There were no vitals taken for this visit.  There were no vitals filed for this visit.  GENERAL: Well-nourished well-developed; Alert, no distress and comfortable.   Alone. He is on home oxygen. He is in a wheelchair.  EYES: no pallor or icterus OROPHARYNX: no thrush or ulceration; good dentition  NECK: supple, no masses felt LYMPH:  no palpable lymphadenopathy in the cervical, axillary or inguinal regions LUNGS:Bilateral decreased air entry to auscultationn and  No wheeze or  crackles HEART/CVS: regular rate & rhythm and no murmurs; No lower extremity edema ABDOMEN:abdomen soft, non-tender and normal bowel sounds Musculoskeletal:no cyanosis of digits and no clubbing  PSYCH: alert & oriented x 3 with fluent speech NEURO: no focal motor/sensory deficits SKIN:  no rashes or significant lesions  LABORATORY DATA:  I have reviewed the data as listed    Component Value Date/Time   NA 141 12/17/2016 1015   NA 139 01/17/2015 0913   K 4.4 12/17/2016 1015   K 3.8 01/17/2015 0913   CL 93 (L) 12/17/2016 1015   CL 99 (L) 01/17/2015 0913   CO2 40 (H) 12/17/2016 1015   CO2 35 (H) 01/17/2015 0913   GLUCOSE 186 (H) 12/17/2016 1015   GLUCOSE 169 (H) 01/17/2015 0913   BUN 20 12/17/2016 1015   BUN 15 01/17/2015 0913   CREATININE 1.14 12/17/2016 1015   CREATININE 1.23 01/17/2015 0913   CALCIUM 7.8 (L) 12/17/2016 1015   CALCIUM 8.8 (L) 01/17/2015 0913   PROT 7.3 12/03/2016 0906   PROT 6.6 01/17/2015 0913   ALBUMIN 4.0 12/03/2016 0906   ALBUMIN 3.7 01/17/2015 0913   AST 18 12/03/2016 0906   AST 20 01/17/2015 0913   ALT 14 (L) 12/03/2016 0906   ALT 15 (L) 01/17/2015 0913   ALKPHOS 78 12/03/2016 0906   ALKPHOS 76 01/17/2015 0913   BILITOT 0.6 12/03/2016 0906   BILITOT 0.5 01/17/2015 0913   GFRNONAA >60 12/17/2016 1015   GFRNONAA >60 01/17/2015 0913   GFRAA >60 12/17/2016 1015   GFRAA >60 01/17/2015 0913    No results found for: SPEP, UPEP  Lab Results  Component Value Date   WBC 14.8 (H) 12/17/2016   NEUTROABS 13.7 (H) 12/17/2016   HGB 9.9 (L) 12/17/2016   HCT 31.2 (L) 12/17/2016   MCV 86.6 12/17/2016   PLT 216 12/17/2016      Chemistry      Component Value Date/Time   NA 141 12/17/2016 1015   NA 139 01/17/2015  0913   K 4.4 12/17/2016 1015   K 3.8 01/17/2015 0913   CL 93 (L) 12/17/2016 1015   CL 99 (L) 01/17/2015 0913   CO2 40 (H) 12/17/2016 1015   CO2 35 (H) 01/17/2015 0913   BUN 20 12/17/2016 1015   BUN 15 01/17/2015 0913   CREATININE 1.14  12/17/2016 1015   CREATININE 1.23 01/17/2015 0913      Component Value Date/Time   CALCIUM 7.8 (L) 12/17/2016 1015   CALCIUM 8.8 (L) 01/17/2015 0913   ALKPHOS 78 12/03/2016 0906   ALKPHOS 76 01/17/2015 0913   AST 18 12/03/2016 0906   AST 20 01/17/2015 0913   ALT 14 (L) 12/03/2016 0906   ALT 15 (L) 01/17/2015 0913   BILITOT 0.6 12/03/2016 0906   BILITOT 0.5 01/17/2015 0913       RADIOGRAPHIC STUDIES: I have personally reviewed the radiological images as listed and agreed with the findings in the report. No results found.   ASSESSMENT & PLAN:  No problem-specific Assessment & Plan notes found for this encounter.   No orders of the defined types were placed in this encounter.     [name], MD 12/24/2016 8:23 AM

## 2016-12-25 ENCOUNTER — Inpatient Hospital Stay: Payer: Medicare HMO

## 2016-12-26 DIAGNOSIS — E119 Type 2 diabetes mellitus without complications: Secondary | ICD-10-CM | POA: Diagnosis not present

## 2016-12-26 DIAGNOSIS — H409 Unspecified glaucoma: Secondary | ICD-10-CM | POA: Diagnosis not present

## 2016-12-26 DIAGNOSIS — M6281 Muscle weakness (generalized): Secondary | ICD-10-CM | POA: Diagnosis not present

## 2016-12-26 DIAGNOSIS — C3410 Malignant neoplasm of upper lobe, unspecified bronchus or lung: Secondary | ICD-10-CM | POA: Diagnosis not present

## 2016-12-26 DIAGNOSIS — C8300 Small cell B-cell lymphoma, unspecified site: Secondary | ICD-10-CM | POA: Diagnosis not present

## 2016-12-26 DIAGNOSIS — I10 Essential (primary) hypertension: Secondary | ICD-10-CM | POA: Diagnosis not present

## 2016-12-26 DIAGNOSIS — C649 Malignant neoplasm of unspecified kidney, except renal pelvis: Secondary | ICD-10-CM | POA: Diagnosis not present

## 2016-12-26 DIAGNOSIS — D649 Anemia, unspecified: Secondary | ICD-10-CM | POA: Diagnosis not present

## 2016-12-26 DIAGNOSIS — J441 Chronic obstructive pulmonary disease with (acute) exacerbation: Secondary | ICD-10-CM | POA: Diagnosis not present

## 2016-12-27 ENCOUNTER — Inpatient Hospital Stay (HOSPITAL_BASED_OUTPATIENT_CLINIC_OR_DEPARTMENT_OTHER): Payer: Medicare HMO | Admitting: Internal Medicine

## 2016-12-27 ENCOUNTER — Inpatient Hospital Stay: Payer: Medicare HMO

## 2016-12-27 DIAGNOSIS — E119 Type 2 diabetes mellitus without complications: Secondary | ICD-10-CM | POA: Diagnosis not present

## 2016-12-27 DIAGNOSIS — R079 Chest pain, unspecified: Secondary | ICD-10-CM

## 2016-12-27 DIAGNOSIS — Z8673 Personal history of transient ischemic attack (TIA), and cerebral infarction without residual deficits: Secondary | ICD-10-CM

## 2016-12-27 DIAGNOSIS — Z85028 Personal history of other malignant neoplasm of stomach: Secondary | ICD-10-CM

## 2016-12-27 DIAGNOSIS — I10 Essential (primary) hypertension: Secondary | ICD-10-CM

## 2016-12-27 DIAGNOSIS — N189 Chronic kidney disease, unspecified: Secondary | ICD-10-CM | POA: Diagnosis not present

## 2016-12-27 DIAGNOSIS — I129 Hypertensive chronic kidney disease with stage 1 through stage 4 chronic kidney disease, or unspecified chronic kidney disease: Secondary | ICD-10-CM

## 2016-12-27 DIAGNOSIS — C8588 Other specified types of non-Hodgkin lymphoma, lymph nodes of multiple sites: Secondary | ICD-10-CM | POA: Diagnosis not present

## 2016-12-27 DIAGNOSIS — C641 Malignant neoplasm of right kidney, except renal pelvis: Secondary | ICD-10-CM

## 2016-12-27 DIAGNOSIS — C3411 Malignant neoplasm of upper lobe, right bronchus or lung: Secondary | ICD-10-CM | POA: Diagnosis not present

## 2016-12-27 DIAGNOSIS — Z809 Family history of malignant neoplasm, unspecified: Secondary | ICD-10-CM

## 2016-12-27 DIAGNOSIS — J449 Chronic obstructive pulmonary disease, unspecified: Secondary | ICD-10-CM

## 2016-12-27 DIAGNOSIS — Z8042 Family history of malignant neoplasm of prostate: Secondary | ICD-10-CM

## 2016-12-27 DIAGNOSIS — K219 Gastro-esophageal reflux disease without esophagitis: Secondary | ICD-10-CM | POA: Diagnosis not present

## 2016-12-27 DIAGNOSIS — Z9221 Personal history of antineoplastic chemotherapy: Secondary | ICD-10-CM

## 2016-12-27 DIAGNOSIS — Z9981 Dependence on supplemental oxygen: Secondary | ICD-10-CM

## 2016-12-27 DIAGNOSIS — C801 Malignant (primary) neoplasm, unspecified: Secondary | ICD-10-CM

## 2016-12-27 DIAGNOSIS — C3492 Malignant neoplasm of unspecified part of left bronchus or lung: Secondary | ICD-10-CM

## 2016-12-27 DIAGNOSIS — Z79899 Other long term (current) drug therapy: Secondary | ICD-10-CM

## 2016-12-27 DIAGNOSIS — C8300 Small cell B-cell lymphoma, unspecified site: Secondary | ICD-10-CM

## 2016-12-27 DIAGNOSIS — R0602 Shortness of breath: Secondary | ICD-10-CM | POA: Diagnosis not present

## 2016-12-27 DIAGNOSIS — Z87891 Personal history of nicotine dependence: Secondary | ICD-10-CM

## 2016-12-27 LAB — COMPREHENSIVE METABOLIC PANEL
ALBUMIN: 3.8 g/dL (ref 3.5–5.0)
ALK PHOS: 87 U/L (ref 38–126)
ALT: 20 U/L (ref 17–63)
AST: 19 U/L (ref 15–41)
Anion gap: 6 (ref 5–15)
BILIRUBIN TOTAL: 0.4 mg/dL (ref 0.3–1.2)
BUN: 20 mg/dL (ref 6–20)
CALCIUM: 8.4 mg/dL — AB (ref 8.9–10.3)
CO2: 37 mmol/L — AB (ref 22–32)
Chloride: 97 mmol/L — ABNORMAL LOW (ref 101–111)
Creatinine, Ser: 1.18 mg/dL (ref 0.61–1.24)
GFR calc Af Amer: >60 mL/min (ref >60)
GFR calc non Af Amer: >60 mL/min (ref >60)
GLUCOSE: 149 mg/dL — AB (ref 65–99)
Potassium: 4.1 mmol/L (ref 3.5–5.1)
Sodium: 140 mmol/L (ref 135–145)
TOTAL PROTEIN: 6.7 g/dL (ref 6.5–8.1)

## 2016-12-27 LAB — CBC WITH DIFFERENTIAL/PLATELET
BASOS ABS: 0 10*3/uL (ref 0–0.1)
BASOS PCT: 1 %
Eosinophils Absolute: 0.3 10*3/uL (ref 0–0.7)
Eosinophils Relative: 6 %
HEMATOCRIT: 29.5 % — AB (ref 40.0–52.0)
HEMOGLOBIN: 9.5 g/dL — AB (ref 13.0–18.0)
Lymphocytes Relative: 14 %
Lymphs Abs: 0.6 10*3/uL — ABNORMAL LOW (ref 1.0–3.6)
MCH: 28 pg (ref 26.0–34.0)
MCHC: 32.3 g/dL (ref 32.0–36.0)
MCV: 86.6 fL (ref 80.0–100.0)
Monocytes Absolute: 0.6 10*3/uL (ref 0.2–1.0)
Monocytes Relative: 13 %
NEUTROS ABS: 3 10*3/uL (ref 1.4–6.5)
NEUTROS PCT: 66 %
Platelets: 212 10*3/uL (ref 150–440)
RBC: 3.41 MIL/uL — AB (ref 4.40–5.90)
RDW: 14.9 % — ABNORMAL HIGH (ref 11.5–14.5)
WBC: 4.6 10*3/uL (ref 3.8–10.6)

## 2016-12-27 MED ORDER — ACALABRUTINIB 100 MG PO CAPS: 100.0000 mg | ORAL_CAPSULE | Freq: Two times a day (BID) | ORAL | 4 refills | Status: DC

## 2016-12-27 MED ORDER — SODIUM CHLORIDE 0.9% FLUSH
10.0000 mL | INTRAVENOUS | Status: DC | PRN
  Administered 2016-12-27: 10 mL via INTRAVENOUS
  Filled 2016-12-27: qty 10

## 2016-12-27 MED ORDER — HEPARIN SOD (PORK) LOCK FLUSH 100 UNIT/ML IV SOLN
INTRAVENOUS | Status: AC
Start: 2016-12-27 — End: 2016-12-27
  Filled 2016-12-27: qty 5

## 2016-12-27 MED ORDER — HEPARIN SOD (PORK) LOCK FLUSH 100 UNIT/ML IV SOLN
500.0000 [IU] | Freq: Once | INTRAVENOUS | Status: AC
  Administered 2016-12-27: 500 [IU] via INTRAVENOUS

## 2016-12-27 MED ORDER — HYDROCODONE-ACETAMINOPHEN 5-325 MG PO TABS: 1.0000 | ORAL_TABLET | Freq: Three times a day (TID) | ORAL | 0 refills | Status: DC | PRN

## 2016-12-27 NOTE — Progress Notes (Signed)
[place] OFFICE PROGRESS NOTE  Patient Care Team: [name], MD as PCP - General (Family Medicine)  Cancer Staging Lung cancer, upper lobe [facility]) Staging form: Lung, AJCC 7th Edition - Clinical stage from 06/15/2010: yT2, N2, M0 - Signed by [name], NP on 02/03/2015 - Pathologic stage from 04/04/2014: yT2, N2, M1 - Signed by [name], NP on 02/03/2015  Lymphoma, small lymphocytic ([place]) Staging form: Lymphoid Neoplasms, AJCC 6th Edition - Clinical stage from 10/15/2013: Stage II - Signed by [name], NP on 02/03/2015  Renal cell cancer [facility]) Staging form: Kidney, AJCC 7th Edition - Clinical stage from 02/03/2015: Stage I (yT1a, N0, M0) - Signed by [name], NP on 02/03/2015    Oncology History   1. Right upper lobe lung mass biopsies positive for adenocarcinoma; PET- T2, N2, M0;  stage IIIA;  carboplatinum Alimta and Avastin;  [april  9th of 2013]  Patient had an allergic reaction to carboplatinum with acute bronchospasm in May of 2013; carbo-discontinued.; Maintenance Alimta and Avastin  from June of 2013 hold chemotherapy because of increasing shortness of breath (in July, 2013); VARISTRAT-GOOD; Started on [place] February 3 about 2014; Diarrhea 4 days after Tarceva was started.  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   # SEP 2017- GAZYVA x1 [stopped sec to respi issues]  # Nov 1st week- START Ibrutinib 2 pills day; Last Jan 22nd 2018 [sec to co-pay].   # FEB 19th- cycle #1 Bendamustine [day-1& 2- '70mg'$ /m2; Rituxan with cycle #2  # RIGHT KIDNEY CA- RCC [s/p Bx]- s/p Cryo  [Dr.Yamagat; IR; 2016]   #  Poor pulmonary function; 3 L home O2.     Lung cancer, upper lobe ([place])   02/03/2015 Initial Diagnosis    Lung cancer, upper lobe       Lymphoma, small lymphocytic ([place])   02/03/2015 Initial Diagnosis    Lymphoma, small lymphocytic (H[facility])      Cancer of lung ([place])   12/21/2015 Initial Diagnosis    Cancer of lung ([place])      Primary cancer of right upper lobe of lung ([place])   06/01/2016 Initial Diagnosis    Primary cancer of right upper lobe of lung (HCC)       INTERVAL HISTORY:  Craig [name]. 66 y.o.  male pleasant patientHistory of chronic respiratory failure on home O2 with above history of Multiple malignancies- most recently with progressive SLL-Status post treatment with bendamustine cycle #1. Patient was admitted to the hospital for "sepsis"/ worsening COPD. Patient treated with antibiotics and steroids and also inhalers. Patient is currently home.  Continues to have chronic chest wall pain- 4 which is on narcotic pain medication. He denies any weight loss. Denies any night sweats. His chronic shortness of breath on 3 L of oxygen; Currently breathing is at baseline. Complains of chronic cough. Denies any fevers or chills.  REVIEW OF SYSTEMS:  A complete 10 point review of system is done which is negative except mentioned above/history of present illness.   PAST MEDICAL HISTORY :  Past Medical History:  Diagnosis Date  . Asthma   . Cancer ([place])   . Colon cancer ([place])   . COPD (chronic obstructive pulmonary disease) ([place])   . Diabetes mellitus without complication ([place])   . Difficulty sleeping   . Dysrhythmia   . GERD (gastroesophageal reflux disease)   . Glaucoma   . History of bronchitis   . Hypertension   . Lung cancer ([place])    right upper lobe mass positive for adenocarcinoma  . Lymphoma (HCC)    low grade  . Renal cancer ([place])   . Respiratory failure ([place]) 07/12/2010   acute  . Right renal mass   . Shortness of breath  dyspnea   . Stroke ([place]) 1990'S   NO RESIDUAL PROBLEMS  . Supplemental oxygen dependent    USES 3 LITERS CONTINUOUSLY    PAST SURGICAL HISTORY :   Past Surgical History:  Procedure Laterality Date  . IR GENERIC HISTORICAL  05/31/2016   IR RADIOLOGIST EVAL & MGMT 05/31/2016 Aletta Edouard, MD [facility] INTERV RAD    FAMILY HISTORY :   Family History  Problem Relation Age of Onset  . Diabetes Mellitus II Mother   . Hypertension Mother   . Cancer Mother   . Heart disease Father   . Hypertension Father   . Tuberculosis Father   . Diabetes Mellitus II Sister   . Cancer Sister   . Prostate cancer Brother     SOCIAL HISTORY:   Social History  Substance Use Topics  . Smoking status: Former Smoker    Packs/day: 1.00    Years: 30.00    Types: Cigarettes    Start date: 12/28/1968    Quit date: 01/13/2010  . Smokeless tobacco: Never Used  . Alcohol use No     Comment: stopped 2002     ALLERGIES:  is allergic to iodinated diagnostic agents and nexium [esomeprazole magnesium].  MEDICATIONS:  Current Outpatient Prescriptions  Medication Sig Dispense Refill  . albuterol (PROVENTIL HFA;VENTOLIN HFA) 108 (90 BASE) MCG/ACT inhaler Inhale 2 puffs into the lungs every 6 (six) hours as needed for wheezing or shortness of breath.     Marland Kitchen albuterol (PROVENTIL) (2.5 MG/3ML) 0.083% nebulizer solution USE 1 VIAL IN NEBULIZER EVERY 3-4 HOURS AS NEEDED 75 mL 3  . budesonide-formoterol (SYMBICORT) 160-4.5 MCG/ACT inhaler Inhale 2 puffs into the lungs 2 (two) times daily.     . diphenhydrAMINE (BENADRYL) 25 MG tablet Take 2 tablets (50 mg total) by mouth as directed. (Patient taking differently: Take 25 mg by mouth as needed for itching or allergies. ) 2 tablet 0  . HYDROcodone-acetaminophen (NORCO/VICODIN) 5-325 MG tablet Take 1 tablet by mouth every 8 (eight) hours as needed for moderate pain. Please Note new directions 90 tablet 0  . loperamide (IMODIUM A-D) 2 MG tablet Take 2 mg by mouth 4 (four)  times daily as needed for diarrhea or loose stools. Reported on 12/21/2015    . losartan-hydrochlorothiazide (HYZAAR) 50-12.5 MG per tablet Take 1 tablet by mouth daily.    . pioglitazone (ACTOS) 30 MG tablet Take 30 mg by mouth daily.    . predniSONE (DELTASONE) 10 MG tablet TAKE 1 TABLET (10 MG TOTAL) BY MOUTH DAILY WITH BREAKFAST. 30 tablet 3  . temazepam (RESTORIL) 15 MG capsule Take 15 mg by mouth at bedtime as needed.     . tiotropium (SPIRIVA) 18 MCG inhalation capsule Place 18 mcg into inhaler and inhale daily.     Marland Kitchen zolpidem (AMBIEN) 5 MG tablet Take 1  or 2 tabs at night if needed    . Acalabrutinib 100 MG CAPS Take 100 mg by mouth every 12 (twelve) hours. 60 capsule 4   No current facility-administered medications for this visit.    Facility-Administered Medications Ordered in Other Visits  Medication Dose Route Frequency Provider Last Rate Last Dose  . sodium chloride 0.9 % injection 10 mL  10 mL Intracatheter PRN Forest Gleason, MD   10 mL at 04/28/15 1410  . sodium chloride 0.9 % injection 10 mL  10 mL Intravenous PRN Forest Gleason, MD   10 mL at 05/12/15 1350  . sodium chloride flush (NS) 0.9 % injection 10 mL  10 mL Intravenous PRN [name], MD   10 mL at 12/27/16 0940    PHYSICAL EXAMINATION: ECOG PERFORMANCE STATUS: 2 - Symptomatic, <50% confined to bed  BP 113/75 (Patient Position: Sitting)   Pulse (!) 112   Temp 97.6 F (36.4 C) (Oral)   Resp (!) 24   Ht '5\' 8"'$  (1.727 m)   Wt 138 lb 9.6 oz (62.9 kg)   BMI 21.07 kg/m   Filed Weights   12/27/16 1009  Weight: 138 lb 9.6 oz (62.9 kg)    GENERAL: Well-nourished well-developed; Alert, no distress and comfortable.   Alone. He is on home oxygen. He is in a wheelchair.  EYES: no pallor or icterus OROPHARYNX: no thrush or ulceration; good dentition  NECK: supple, no masses felt LYMPH:  no palpable lymphadenopathy in the cervical, axillary or inguinal regions LUNGS:Bilateral decreased air entry to auscultationn  and  No wheeze or crackles HEART/CVS: regular rate & rhythm and no murmurs; No lower extremity edema ABDOMEN:abdomen soft, non-tender and normal bowel sounds Musculoskeletal:no cyanosis of digits and no clubbing  PSYCH: alert & oriented x 3 with fluent speech NEURO: no focal motor/sensory deficits SKIN:  no rashes or significant lesions  LABORATORY DATA:  I have reviewed the data as listed    Component Value Date/Time   NA 140 12/27/2016 0939   NA 139 01/17/2015 0913   K 4.1 12/27/2016 0939   K 3.8 01/17/2015 0913   CL 97 (L) 12/27/2016 0939   CL 99 (L) 01/17/2015 0913   CO2 37 (H) 12/27/2016 0939   CO2 35 (H) 01/17/2015 0913   GLUCOSE 149 (H) 12/27/2016 0939   GLUCOSE 169 (H) 01/17/2015 0913   BUN 20 12/27/2016 0939   BUN 15 01/17/2015 0913   CREATININE 1.18 12/27/2016 0939   CREATININE 1.23 01/17/2015 0913   CALCIUM 8.4 (L) 12/27/2016 0939   CALCIUM 8.8 (L) 01/17/2015 0913   PROT 6.7 12/27/2016 0939   PROT 6.6 01/17/2015 0913   ALBUMIN 3.8 12/27/2016 0939   ALBUMIN 3.7 01/17/2015 0913   AST 19 12/27/2016 0939   AST 20 01/17/2015 0913   ALT 20 12/27/2016 0939   ALT 15 (L) 01/17/2015 0913   ALKPHOS 87 12/27/2016 0939   ALKPHOS 76 01/17/2015 0913   BILITOT 0.4 12/27/2016 0939   BILITOT 0.5 01/17/2015 0913   GFRNONAA >60 12/27/2016 0939   GFRNONAA >60 01/17/2015 0913   GFRAA >60 12/27/2016 0939   GFRAA >60 01/17/2015 0913    No results found for: SPEP, UPEP  Lab Results  Component Value Date   WBC 4.6 12/27/2016   NEUTROABS 3.0 12/27/2016   HGB 9.5 (L) 12/27/2016   HCT 29.5 (L) 12/27/2016   MCV 86.6 12/27/2016   PLT 212 12/27/2016      Chemistry      Component  Value Date/Time   NA 140 12/27/2016 0939   NA 139 01/17/2015 0913   K 4.1 12/27/2016 0939   K 3.8 01/17/2015 0913   CL 97 (L) 12/27/2016 0939   CL 99 (L) 01/17/2015 0913   CO2 37 (H) 12/27/2016 0939   CO2 35 (H) 01/17/2015 0913   BUN 20 12/27/2016 0939   BUN 15 01/17/2015 0913   CREATININE  1.18 12/27/2016 0939   CREATININE 1.23 01/17/2015 0913      Component Value Date/Time   CALCIUM 8.4 (L) 12/27/2016 0939   CALCIUM 8.8 (L) 01/17/2015 0913   ALKPHOS 87 12/27/2016 0939   ALKPHOS 76 01/17/2015 0913   AST 19 12/27/2016 0939   AST 20 01/17/2015 0913   ALT 20 12/27/2016 0939   ALT 15 (L) 01/17/2015 0913   BILITOT 0.4 12/27/2016 0939   BILITOT 0.5 01/17/2015 0913       RADIOGRAPHIC STUDIES: I have personally reviewed the radiological images as listed and agreed with the findings in the report. No results found.   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 Hemoglobin approximately 9.6. Discontinued ibrutinib sec to financial issues.   # Status post cycle #1 of bendamustine- however poor tolerance. Recommend discontinuation at this time  # Recommend starting acalbutinib 100 mg BID. Discussed the potential side effects. Understands that it might be difficult to access the drug. Prescription started.  # Pain- chest when coughing- continue pain meds prn. New script given.   # Advanced COPD 3 L of oxygen- discussed the potential infusion reactions with Rituxan.  # CKD creatinine 1.2-1.3. This has to be monitored closely with bendamustine. Also cut the dose of bendamustine.  # follow up with me in 4 weeks/labs.    Orders Placed This Encounter  Procedures  . CBC with Differential    Standing Status:   Future    Standing Expiration Date:   12/27/2017  . Comprehensive metabolic panel    Standing Status:   Future    Standing Expiration Date:   12/27/2017  . Lactate dehydrogenase    Standing Status:   Future    Standing Expiration Date:   12/27/2017  . Hold Tube- Blood Bank    Standing Status:   Future    Standing Expiration Date:   12/27/2017      [name], MD 12/27/2016 12:22 PM

## 2016-12-27 NOTE — Progress Notes (Signed)
Patient here for lymphoma follow-up with Dr. Rogue Bussing. Pt c/o dyspnea with audible wheeze. sats on 3L's of oxygen is 93%

## 2016-12-27 NOTE — Assessment & Plan Note (Addendum)
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 Hemoglobin approximately 9.6. Discontinued ibrutinib sec to financial issues.   # Status post cycle #1 of bendamustine- however poor tolerance. Recommend discontinuation at this time  # Recommend starting acalbutinib 100 mg BID. Discussed the potential side effects. Understands that it might be difficult to access the drug. Prescription started.  # Pain- chest when coughing- continue pain meds prn. New script given.   # Advanced COPD 3 L of oxygen- discussed the potential infusion reactions with Rituxan.  # CKD creatinine 1.2-1.3. This has to be monitored closely with bendamustine. Also cut the dose of bendamustine.  # follow up with me in 4 weeks/labs.

## 2016-12-28 ENCOUNTER — Inpatient Hospital Stay: Payer: Medicare HMO

## 2017-01-03 ENCOUNTER — Other Ambulatory Visit: Payer: Self-pay | Admitting: *Deleted

## 2017-01-04 ENCOUNTER — Other Ambulatory Visit: Payer: Self-pay | Admitting: *Deleted

## 2017-01-04 DIAGNOSIS — C8300 Small cell B-cell lymphoma, unspecified site: Secondary | ICD-10-CM | POA: Diagnosis not present

## 2017-01-04 DIAGNOSIS — C649 Malignant neoplasm of unspecified kidney, except renal pelvis: Secondary | ICD-10-CM | POA: Diagnosis not present

## 2017-01-04 DIAGNOSIS — M6281 Muscle weakness (generalized): Secondary | ICD-10-CM | POA: Diagnosis not present

## 2017-01-04 DIAGNOSIS — H409 Unspecified glaucoma: Secondary | ICD-10-CM | POA: Diagnosis not present

## 2017-01-04 DIAGNOSIS — C3410 Malignant neoplasm of upper lobe, unspecified bronchus or lung: Secondary | ICD-10-CM | POA: Diagnosis not present

## 2017-01-04 DIAGNOSIS — D649 Anemia, unspecified: Secondary | ICD-10-CM | POA: Diagnosis not present

## 2017-01-04 DIAGNOSIS — I10 Essential (primary) hypertension: Secondary | ICD-10-CM | POA: Diagnosis not present

## 2017-01-04 DIAGNOSIS — J441 Chronic obstructive pulmonary disease with (acute) exacerbation: Secondary | ICD-10-CM | POA: Diagnosis not present

## 2017-01-04 DIAGNOSIS — E119 Type 2 diabetes mellitus without complications: Secondary | ICD-10-CM | POA: Diagnosis not present

## 2017-01-04 MED ORDER — ACALABRUTINIB 100 MG PO CAPS: 100.0000 mg | ORAL_CAPSULE | Freq: Two times a day (BID) | ORAL | 4 refills | Status: DC

## 2017-01-04 NOTE — Telephone Encounter (Signed)
Needs his chemo pill sent to [facility]. Had taken printed Rx to local pharmacy

## 2017-01-09 DIAGNOSIS — C649 Malignant neoplasm of unspecified kidney, except renal pelvis: Secondary | ICD-10-CM | POA: Diagnosis not present

## 2017-01-09 DIAGNOSIS — E119 Type 2 diabetes mellitus without complications: Secondary | ICD-10-CM | POA: Diagnosis not present

## 2017-01-09 DIAGNOSIS — J441 Chronic obstructive pulmonary disease with (acute) exacerbation: Secondary | ICD-10-CM | POA: Diagnosis not present

## 2017-01-09 DIAGNOSIS — D649 Anemia, unspecified: Secondary | ICD-10-CM | POA: Diagnosis not present

## 2017-01-09 DIAGNOSIS — I10 Essential (primary) hypertension: Secondary | ICD-10-CM | POA: Diagnosis not present

## 2017-01-09 DIAGNOSIS — M6281 Muscle weakness (generalized): Secondary | ICD-10-CM | POA: Diagnosis not present

## 2017-01-09 DIAGNOSIS — H409 Unspecified glaucoma: Secondary | ICD-10-CM | POA: Diagnosis not present

## 2017-01-09 DIAGNOSIS — C3410 Malignant neoplasm of upper lobe, unspecified bronchus or lung: Secondary | ICD-10-CM | POA: Diagnosis not present

## 2017-01-09 DIAGNOSIS — C8300 Small cell B-cell lymphoma, unspecified site: Secondary | ICD-10-CM | POA: Diagnosis not present

## 2017-01-11 DIAGNOSIS — C649 Malignant neoplasm of unspecified kidney, except renal pelvis: Secondary | ICD-10-CM | POA: Diagnosis not present

## 2017-01-11 DIAGNOSIS — C8300 Small cell B-cell lymphoma, unspecified site: Secondary | ICD-10-CM | POA: Diagnosis not present

## 2017-01-11 DIAGNOSIS — J441 Chronic obstructive pulmonary disease with (acute) exacerbation: Secondary | ICD-10-CM | POA: Diagnosis not present

## 2017-01-11 DIAGNOSIS — C3410 Malignant neoplasm of upper lobe, unspecified bronchus or lung: Secondary | ICD-10-CM | POA: Diagnosis not present

## 2017-01-11 DIAGNOSIS — M6281 Muscle weakness (generalized): Secondary | ICD-10-CM | POA: Diagnosis not present

## 2017-01-11 DIAGNOSIS — E119 Type 2 diabetes mellitus without complications: Secondary | ICD-10-CM | POA: Diagnosis not present

## 2017-01-11 DIAGNOSIS — H409 Unspecified glaucoma: Secondary | ICD-10-CM | POA: Diagnosis not present

## 2017-01-11 DIAGNOSIS — D649 Anemia, unspecified: Secondary | ICD-10-CM | POA: Diagnosis not present

## 2017-01-11 DIAGNOSIS — I10 Essential (primary) hypertension: Secondary | ICD-10-CM | POA: Diagnosis not present

## 2017-01-14 DIAGNOSIS — C3410 Malignant neoplasm of upper lobe, unspecified bronchus or lung: Secondary | ICD-10-CM | POA: Diagnosis not present

## 2017-01-14 DIAGNOSIS — E119 Type 2 diabetes mellitus without complications: Secondary | ICD-10-CM | POA: Diagnosis not present

## 2017-01-14 DIAGNOSIS — M6281 Muscle weakness (generalized): Secondary | ICD-10-CM | POA: Diagnosis not present

## 2017-01-14 DIAGNOSIS — I10 Essential (primary) hypertension: Secondary | ICD-10-CM | POA: Diagnosis not present

## 2017-01-14 DIAGNOSIS — C8300 Small cell B-cell lymphoma, unspecified site: Secondary | ICD-10-CM | POA: Diagnosis not present

## 2017-01-14 DIAGNOSIS — C649 Malignant neoplasm of unspecified kidney, except renal pelvis: Secondary | ICD-10-CM | POA: Diagnosis not present

## 2017-01-14 DIAGNOSIS — J441 Chronic obstructive pulmonary disease with (acute) exacerbation: Secondary | ICD-10-CM | POA: Diagnosis not present

## 2017-01-14 DIAGNOSIS — D649 Anemia, unspecified: Secondary | ICD-10-CM | POA: Diagnosis not present

## 2017-01-14 DIAGNOSIS — H409 Unspecified glaucoma: Secondary | ICD-10-CM | POA: Diagnosis not present

## 2017-01-17 DIAGNOSIS — C3410 Malignant neoplasm of upper lobe, unspecified bronchus or lung: Secondary | ICD-10-CM | POA: Diagnosis not present

## 2017-01-17 DIAGNOSIS — C8300 Small cell B-cell lymphoma, unspecified site: Secondary | ICD-10-CM | POA: Diagnosis not present

## 2017-01-17 DIAGNOSIS — M6281 Muscle weakness (generalized): Secondary | ICD-10-CM | POA: Diagnosis not present

## 2017-01-17 DIAGNOSIS — I10 Essential (primary) hypertension: Secondary | ICD-10-CM | POA: Diagnosis not present

## 2017-01-17 DIAGNOSIS — J441 Chronic obstructive pulmonary disease with (acute) exacerbation: Secondary | ICD-10-CM | POA: Diagnosis not present

## 2017-01-17 DIAGNOSIS — H409 Unspecified glaucoma: Secondary | ICD-10-CM | POA: Diagnosis not present

## 2017-01-17 DIAGNOSIS — C649 Malignant neoplasm of unspecified kidney, except renal pelvis: Secondary | ICD-10-CM | POA: Diagnosis not present

## 2017-01-17 DIAGNOSIS — E119 Type 2 diabetes mellitus without complications: Secondary | ICD-10-CM | POA: Diagnosis not present

## 2017-01-17 DIAGNOSIS — D649 Anemia, unspecified: Secondary | ICD-10-CM | POA: Diagnosis not present

## 2017-01-22 DIAGNOSIS — C8300 Small cell B-cell lymphoma, unspecified site: Secondary | ICD-10-CM | POA: Diagnosis not present

## 2017-01-22 DIAGNOSIS — C3410 Malignant neoplasm of upper lobe, unspecified bronchus or lung: Secondary | ICD-10-CM | POA: Diagnosis not present

## 2017-01-22 DIAGNOSIS — J441 Chronic obstructive pulmonary disease with (acute) exacerbation: Secondary | ICD-10-CM | POA: Diagnosis not present

## 2017-01-22 DIAGNOSIS — H409 Unspecified glaucoma: Secondary | ICD-10-CM | POA: Diagnosis not present

## 2017-01-22 DIAGNOSIS — M6281 Muscle weakness (generalized): Secondary | ICD-10-CM | POA: Diagnosis not present

## 2017-01-22 DIAGNOSIS — E119 Type 2 diabetes mellitus without complications: Secondary | ICD-10-CM | POA: Diagnosis not present

## 2017-01-22 DIAGNOSIS — I10 Essential (primary) hypertension: Secondary | ICD-10-CM | POA: Diagnosis not present

## 2017-01-22 DIAGNOSIS — D649 Anemia, unspecified: Secondary | ICD-10-CM | POA: Diagnosis not present

## 2017-01-22 DIAGNOSIS — C649 Malignant neoplasm of unspecified kidney, except renal pelvis: Secondary | ICD-10-CM | POA: Diagnosis not present

## 2017-01-24 DIAGNOSIS — C3410 Malignant neoplasm of upper lobe, unspecified bronchus or lung: Secondary | ICD-10-CM | POA: Diagnosis not present

## 2017-01-24 DIAGNOSIS — M6281 Muscle weakness (generalized): Secondary | ICD-10-CM | POA: Diagnosis not present

## 2017-01-24 DIAGNOSIS — E119 Type 2 diabetes mellitus without complications: Secondary | ICD-10-CM | POA: Diagnosis not present

## 2017-01-24 DIAGNOSIS — C649 Malignant neoplasm of unspecified kidney, except renal pelvis: Secondary | ICD-10-CM | POA: Diagnosis not present

## 2017-01-24 DIAGNOSIS — C8300 Small cell B-cell lymphoma, unspecified site: Secondary | ICD-10-CM | POA: Diagnosis not present

## 2017-01-24 DIAGNOSIS — D649 Anemia, unspecified: Secondary | ICD-10-CM | POA: Diagnosis not present

## 2017-01-24 DIAGNOSIS — I10 Essential (primary) hypertension: Secondary | ICD-10-CM | POA: Diagnosis not present

## 2017-01-24 DIAGNOSIS — H409 Unspecified glaucoma: Secondary | ICD-10-CM | POA: Diagnosis not present

## 2017-01-24 DIAGNOSIS — J441 Chronic obstructive pulmonary disease with (acute) exacerbation: Secondary | ICD-10-CM | POA: Diagnosis not present

## 2017-01-28 DIAGNOSIS — J700 Acute pulmonary manifestations due to radiation: Secondary | ICD-10-CM | POA: Diagnosis not present

## 2017-01-28 DIAGNOSIS — J439 Emphysema, unspecified: Secondary | ICD-10-CM | POA: Diagnosis not present

## 2017-01-28 DIAGNOSIS — C3491 Malignant neoplasm of unspecified part of right bronchus or lung: Secondary | ICD-10-CM | POA: Diagnosis not present

## 2017-01-28 DIAGNOSIS — R0902 Hypoxemia: Secondary | ICD-10-CM | POA: Diagnosis not present

## 2017-01-28 DIAGNOSIS — R0609 Other forms of dyspnea: Secondary | ICD-10-CM | POA: Diagnosis not present

## 2017-01-30 ENCOUNTER — Inpatient Hospital Stay (HOSPITAL_BASED_OUTPATIENT_CLINIC_OR_DEPARTMENT_OTHER): Payer: Medicare HMO | Admitting: Internal Medicine

## 2017-01-30 ENCOUNTER — Encounter: Payer: Self-pay | Admitting: Emergency Medicine

## 2017-01-30 ENCOUNTER — Emergency Department: Payer: Medicare HMO

## 2017-01-30 ENCOUNTER — Inpatient Hospital Stay
Admission: EM | Admit: 2017-01-30 | Discharge: 2017-02-01 | DRG: 190 | Disposition: A | Discharge status: Home / Self Care | Payer: Medicare HMO | Attending: Internal Medicine | Admitting: Internal Medicine

## 2017-01-30 ENCOUNTER — Inpatient Hospital Stay: Payer: Medicare HMO | Attending: Internal Medicine

## 2017-01-30 ENCOUNTER — Other Ambulatory Visit: Payer: Self-pay

## 2017-01-30 VITALS — BP 123/75 | HR 104 | Temp 97.8°F | Resp 26

## 2017-01-30 DIAGNOSIS — K219 Gastro-esophageal reflux disease without esophagitis: Secondary | ICD-10-CM | POA: Diagnosis present

## 2017-01-30 DIAGNOSIS — Z7984 Long term (current) use of oral hypoglycemic drugs: Secondary | ICD-10-CM

## 2017-01-30 DIAGNOSIS — C3412 Malignant neoplasm of upper lobe, left bronchus or lung: Secondary | ICD-10-CM | POA: Insufficient documentation

## 2017-01-30 DIAGNOSIS — Z79899 Other long term (current) drug therapy: Secondary | ICD-10-CM

## 2017-01-30 DIAGNOSIS — Z8673 Personal history of transient ischemic attack (TIA), and cerebral infarction without residual deficits: Secondary | ICD-10-CM

## 2017-01-30 DIAGNOSIS — Z9981 Dependence on supplemental oxygen: Secondary | ICD-10-CM

## 2017-01-30 DIAGNOSIS — J9620 Acute and chronic respiratory failure, unspecified whether with hypoxia or hypercapnia: Secondary | ICD-10-CM | POA: Diagnosis present

## 2017-01-30 DIAGNOSIS — E1122 Type 2 diabetes mellitus with diabetic chronic kidney disease: Secondary | ICD-10-CM | POA: Diagnosis present

## 2017-01-30 DIAGNOSIS — I129 Hypertensive chronic kidney disease with stage 1 through stage 4 chronic kidney disease, or unspecified chronic kidney disease: Secondary | ICD-10-CM | POA: Diagnosis present

## 2017-01-30 DIAGNOSIS — Z85038 Personal history of other malignant neoplasm of large intestine: Secondary | ICD-10-CM

## 2017-01-30 DIAGNOSIS — G8929 Other chronic pain: Secondary | ICD-10-CM

## 2017-01-30 DIAGNOSIS — Z9221 Personal history of antineoplastic chemotherapy: Secondary | ICD-10-CM

## 2017-01-30 DIAGNOSIS — Z923 Personal history of irradiation: Secondary | ICD-10-CM | POA: Insufficient documentation

## 2017-01-30 DIAGNOSIS — Z8042 Family history of malignant neoplasm of prostate: Secondary | ICD-10-CM

## 2017-01-30 DIAGNOSIS — Z8249 Family history of ischemic heart disease and other diseases of the circulatory system: Secondary | ICD-10-CM | POA: Diagnosis not present

## 2017-01-30 DIAGNOSIS — E119 Type 2 diabetes mellitus without complications: Secondary | ICD-10-CM

## 2017-01-30 DIAGNOSIS — C8590 Non-Hodgkin lymphoma, unspecified, unspecified site: Secondary | ICD-10-CM | POA: Diagnosis not present

## 2017-01-30 DIAGNOSIS — R0789 Other chest pain: Secondary | ICD-10-CM | POA: Insufficient documentation

## 2017-01-30 DIAGNOSIS — J441 Chronic obstructive pulmonary disease with (acute) exacerbation: Principal | ICD-10-CM | POA: Diagnosis present

## 2017-01-30 DIAGNOSIS — Z8572 Personal history of non-Hodgkin lymphomas: Secondary | ICD-10-CM

## 2017-01-30 DIAGNOSIS — C641 Malignant neoplasm of right kidney, except renal pelvis: Secondary | ICD-10-CM | POA: Diagnosis not present

## 2017-01-30 DIAGNOSIS — Z91041 Radiographic dye allergy status: Secondary | ICD-10-CM

## 2017-01-30 DIAGNOSIS — C3410 Malignant neoplasm of upper lobe, unspecified bronchus or lung: Secondary | ICD-10-CM

## 2017-01-30 DIAGNOSIS — Z7952 Long term (current) use of systemic steroids: Secondary | ICD-10-CM

## 2017-01-30 DIAGNOSIS — Z85528 Personal history of other malignant neoplasm of kidney: Secondary | ICD-10-CM | POA: Diagnosis not present

## 2017-01-30 DIAGNOSIS — C8300 Small cell B-cell lymphoma, unspecified site: Secondary | ICD-10-CM

## 2017-01-30 DIAGNOSIS — J440 Chronic obstructive pulmonary disease with acute lower respiratory infection: Secondary | ICD-10-CM | POA: Diagnosis not present

## 2017-01-30 DIAGNOSIS — Z87891 Personal history of nicotine dependence: Secondary | ICD-10-CM | POA: Insufficient documentation

## 2017-01-30 DIAGNOSIS — I10 Essential (primary) hypertension: Secondary | ICD-10-CM | POA: Diagnosis not present

## 2017-01-30 DIAGNOSIS — Z809 Family history of malignant neoplasm, unspecified: Secondary | ICD-10-CM | POA: Insufficient documentation

## 2017-01-30 DIAGNOSIS — Z7951 Long term (current) use of inhaled steroids: Secondary | ICD-10-CM | POA: Diagnosis not present

## 2017-01-30 DIAGNOSIS — R590 Localized enlarged lymph nodes: Secondary | ICD-10-CM | POA: Insufficient documentation

## 2017-01-30 DIAGNOSIS — J209 Acute bronchitis, unspecified: Secondary | ICD-10-CM | POA: Diagnosis not present

## 2017-01-30 DIAGNOSIS — N183 Chronic kidney disease, stage 3 (moderate): Secondary | ICD-10-CM | POA: Diagnosis not present

## 2017-01-30 DIAGNOSIS — Z833 Family history of diabetes mellitus: Secondary | ICD-10-CM

## 2017-01-30 DIAGNOSIS — Z888 Allergy status to other drugs, medicaments and biological substances status: Secondary | ICD-10-CM

## 2017-01-30 DIAGNOSIS — H409 Unspecified glaucoma: Secondary | ICD-10-CM | POA: Diagnosis present

## 2017-01-30 DIAGNOSIS — R791 Abnormal coagulation profile: Secondary | ICD-10-CM | POA: Diagnosis not present

## 2017-01-30 DIAGNOSIS — C3411 Malignant neoplasm of upper lobe, right bronchus or lung: Secondary | ICD-10-CM | POA: Diagnosis not present

## 2017-01-30 DIAGNOSIS — J449 Chronic obstructive pulmonary disease, unspecified: Secondary | ICD-10-CM | POA: Insufficient documentation

## 2017-01-30 DIAGNOSIS — R7989 Other specified abnormal findings of blood chemistry: Secondary | ICD-10-CM

## 2017-01-30 DIAGNOSIS — C3492 Malignant neoplasm of unspecified part of left bronchus or lung: Secondary | ICD-10-CM

## 2017-01-30 DIAGNOSIS — R0602 Shortness of breath: Secondary | ICD-10-CM | POA: Diagnosis not present

## 2017-01-30 LAB — CBC WITH DIFFERENTIAL/PLATELET
Basophils Absolute: 0 10*3/uL (ref 0–0.1)
Basophils Absolute: 0 10*3/uL (ref 0–0.1)
Basophils Relative: 0 %
Basophils Relative: 0 %
EOS PCT: 0 %
EOS PCT: 0 %
Eosinophils Absolute: 0 10*3/uL (ref 0–0.7)
Eosinophils Absolute: 0 10*3/uL (ref 0–0.7)
HCT: 27.9 % — ABNORMAL LOW (ref 40.0–52.0)
HCT: 28.4 % — ABNORMAL LOW (ref 40.0–52.0)
Hemoglobin: 9 g/dL — ABNORMAL LOW (ref 13.0–18.0)
Hemoglobin: 9 g/dL — ABNORMAL LOW (ref 13.0–18.0)
LYMPHS ABS: 0.3 10*3/uL — AB (ref 1.0–3.6)
LYMPHS ABS: 0.2 10*3/uL — AB (ref 1.0–3.6)
LYMPHS PCT: 6 %
Lymphocytes Relative: 3 %
MCH: 28.1 pg (ref 26.0–34.0)
MCH: 27.6 pg (ref 26.0–34.0)
MCHC: 32.3 g/dL (ref 32.0–36.0)
MCHC: 31.7 g/dL — AB (ref 32.0–36.0)
MCV: 86.9 fL (ref 80.0–100.0)
MCV: 87.3 fL (ref 80.0–100.0)
MONOS PCT: 4 %
Monocytes Absolute: 0.2 10*3/uL (ref 0.2–1.0)
Monocytes Absolute: 0.3 10*3/uL (ref 0.2–1.0)
Monocytes Relative: 4 %
Neutro Abs: 4.5 10*3/uL (ref 1.4–6.5)
Neutro Abs: 5.3 10*3/uL (ref 1.4–6.5)
Neutrophils Relative %: 90 %
Neutrophils Relative %: 93 %
PLATELETS: 201 10*3/uL (ref 150–440)
Platelets: 190 10*3/uL (ref 150–440)
RBC: 3.21 MIL/uL — AB (ref 4.40–5.90)
RBC: 3.25 MIL/uL — ABNORMAL LOW (ref 4.40–5.90)
RDW: 15.2 % — ABNORMAL HIGH (ref 11.5–14.5)
RDW: 15.5 % — ABNORMAL HIGH (ref 11.5–14.5)
WBC: 5.1 10*3/uL (ref 3.8–10.6)
WBC: 5.8 10*3/uL (ref 3.8–10.6)

## 2017-01-30 LAB — GLUCOSE, CAPILLARY: Glucose-Capillary: 255 mg/dL — ABNORMAL HIGH (ref 65–99)

## 2017-01-30 LAB — SAMPLE TO BLOOD BANK

## 2017-01-30 LAB — BASIC METABOLIC PANEL
Anion gap: 6 (ref 5–15)
BUN: 33 mg/dL — AB (ref 6–20)
CALCIUM: 8.5 mg/dL — AB (ref 8.9–10.3)
CO2: 38 mmol/L — AB (ref 22–32)
CREATININE: 1.28 mg/dL — AB (ref 0.61–1.24)
Chloride: 96 mmol/L — ABNORMAL LOW (ref 101–111)
GFR calc Af Amer: >60 mL/min (ref >60)
GFR calc non Af Amer: 57 mL/min — ABNORMAL LOW (ref >60)
GLUCOSE: 168 mg/dL — AB (ref 65–99)
Potassium: 5.3 mmol/L — ABNORMAL HIGH (ref 3.5–5.1)
SODIUM: 140 mmol/L (ref 135–145)

## 2017-01-30 LAB — COMPREHENSIVE METABOLIC PANEL
ALK PHOS: 76 U/L (ref 38–126)
ALT: 15 U/L — ABNORMAL LOW (ref 17–63)
AST: 17 U/L (ref 15–41)
Albumin: 4.1 g/dL (ref 3.5–5.0)
Anion gap: 7 (ref 5–15)
BILIRUBIN TOTAL: 0.4 mg/dL (ref 0.3–1.2)
BUN: 33 mg/dL — AB (ref 6–20)
CALCIUM: 8.5 mg/dL — AB (ref 8.9–10.3)
CO2: 36 mmol/L — ABNORMAL HIGH (ref 22–32)
CREATININE: 1.33 mg/dL — AB (ref 0.61–1.24)
Chloride: 96 mmol/L — ABNORMAL LOW (ref 101–111)
GFR, EST NON AFRICAN AMERICAN: 55 mL/min — AB (ref >60)
Glucose, Bld: 186 mg/dL — ABNORMAL HIGH (ref 65–99)
Potassium: 4.8 mmol/L (ref 3.5–5.1)
Sodium: 139 mmol/L (ref 135–145)
TOTAL PROTEIN: 6.9 g/dL (ref 6.5–8.1)

## 2017-01-30 LAB — FIBRIN DERIVATIVES D-DIMER (ARMC ONLY): Fibrin derivatives D-dimer (ARMC): 1061.73 — ABNORMAL HIGH (ref 0.00–499.00)

## 2017-01-30 LAB — LACTIC ACID, PLASMA: Lactic Acid, Venous: 1.4 mmol/L (ref 0.5–1.9)

## 2017-01-30 LAB — BRAIN NATRIURETIC PEPTIDE: B NATRIURETIC PEPTIDE 5: 58 pg/mL (ref 0.0–100.0)

## 2017-01-30 LAB — LACTATE DEHYDROGENASE: LDH: 144 U/L (ref 98–192)

## 2017-01-30 MED ORDER — INSULIN ASPART 100 UNIT/ML ~~LOC~~ SOLN
0.0000 [IU] | Freq: Three times a day (TID) | SUBCUTANEOUS | Status: DC
  Administered 2017-01-31: 09:00:00 2 [IU] via SUBCUTANEOUS
  Administered 2017-01-31: 17:00:00 3 [IU] via SUBCUTANEOUS
  Filled 2017-01-30: qty 3
  Filled 2017-01-30: qty 1

## 2017-01-30 MED ORDER — LEVOFLOXACIN 500 MG PO TABS
250.0000 mg | ORAL_TABLET | Freq: Every day | ORAL | Status: DC
  Administered 2017-01-30: 250 mg via ORAL
  Filled 2017-01-30: qty 1

## 2017-01-30 MED ORDER — MOMETASONE FURO-FORMOTEROL FUM 200-5 MCG/ACT IN AERO
2.0000 | INHALATION_SPRAY | Freq: Two times a day (BID) | RESPIRATORY_TRACT | Status: DC
  Administered 2017-01-30 – 2017-02-01 (×4): 2 via RESPIRATORY_TRACT
  Filled 2017-01-30: qty 8.8

## 2017-01-30 MED ORDER — INSULIN ASPART 100 UNIT/ML ~~LOC~~ SOLN
0.0000 [IU] | Freq: Every day | SUBCUTANEOUS | Status: DC
  Administered 2017-01-30: 3 [IU] via SUBCUTANEOUS
  Filled 2017-01-30: qty 3

## 2017-01-30 MED ORDER — IPRATROPIUM-ALBUTEROL 0.5-2.5 (3) MG/3ML IN SOLN
3.0000 mL | RESPIRATORY_TRACT | Status: DC
  Administered 2017-01-30 – 2017-02-01 (×7): 3 mL via RESPIRATORY_TRACT
  Filled 2017-01-30 (×8): qty 3

## 2017-01-30 MED ORDER — ALBUTEROL SULFATE (2.5 MG/3ML) 0.083% IN NEBU: 2.5000 mg | INHALATION_SOLUTION | RESPIRATORY_TRACT | Status: DC | PRN

## 2017-01-30 MED ORDER — LOPERAMIDE HCL 2 MG PO CAPS: 2.0000 mg | ORAL_CAPSULE | Freq: Four times a day (QID) | ORAL | Status: DC | PRN

## 2017-01-30 MED ORDER — ACETAMINOPHEN 650 MG RE SUPP: 650.0000 mg | Freq: Four times a day (QID) | RECTAL | Status: DC | PRN

## 2017-01-30 MED ORDER — METHYLPREDNISOLONE SODIUM SUCC 40 MG IJ SOLR
40.0000 mg | Freq: Once | INTRAMUSCULAR | Status: AC
  Administered 2017-01-30: 40 mg via INTRAVENOUS
  Filled 2017-01-30: qty 1

## 2017-01-30 MED ORDER — SODIUM CHLORIDE 0.9 % IV BOLUS (SEPSIS)
500.0000 mL | Freq: Once | INTRAVENOUS | Status: AC
  Administered 2017-01-30: 22:00:00 500 mL via INTRAVENOUS

## 2017-01-30 MED ORDER — TIOTROPIUM BROMIDE MONOHYDRATE 18 MCG IN CAPS
18.0000 ug | ORAL_CAPSULE | Freq: Every day | RESPIRATORY_TRACT | Status: DC
  Administered 2017-01-31 – 2017-02-01 (×2): 18 ug via RESPIRATORY_TRACT
  Filled 2017-01-30: qty 5

## 2017-01-30 MED ORDER — TEMAZEPAM 7.5 MG PO CAPS
15.0000 mg | ORAL_CAPSULE | Freq: Every evening | ORAL | Status: DC | PRN
  Administered 2017-01-30 – 2017-01-31 (×2): 15 mg via ORAL
  Filled 2017-01-30 (×2): qty 2

## 2017-01-30 MED ORDER — ENOXAPARIN SODIUM 40 MG/0.4ML ~~LOC~~ SOLN
40.0000 mg | SUBCUTANEOUS | Status: DC
  Administered 2017-01-30 – 2017-01-31 (×2): 40 mg via SUBCUTANEOUS
  Filled 2017-01-30 (×2): qty 0.4

## 2017-01-30 MED ORDER — METHYLPREDNISOLONE SODIUM SUCC 125 MG IJ SOLR
60.0000 mg | Freq: Two times a day (BID) | INTRAMUSCULAR | Status: DC
  Administered 2017-01-30 – 2017-01-31 (×3): 60 mg via INTRAVENOUS
  Filled 2017-01-30 (×3): qty 2

## 2017-01-30 MED ORDER — HYDROCHLOROTHIAZIDE 12.5 MG PO CAPS
12.5000 mg | ORAL_CAPSULE | Freq: Every day | ORAL | Status: DC
  Administered 2017-01-31 – 2017-02-01 (×2): 12.5 mg via ORAL
  Filled 2017-01-30 (×2): qty 1

## 2017-01-30 MED ORDER — ALBUTEROL SULFATE (2.5 MG/3ML) 0.083% IN NEBU
5.0000 mg | INHALATION_SOLUTION | Freq: Once | RESPIRATORY_TRACT | Status: AC
  Administered 2017-01-30: 5 mg via RESPIRATORY_TRACT
  Filled 2017-01-30: qty 6

## 2017-01-30 MED ORDER — ACETAMINOPHEN 325 MG PO TABS: 650.0000 mg | ORAL_TABLET | Freq: Four times a day (QID) | ORAL | Status: DC | PRN

## 2017-01-30 MED ORDER — PIOGLITAZONE HCL 30 MG PO TABS
30.0000 mg | ORAL_TABLET | Freq: Every day | ORAL | Status: DC
  Administered 2017-01-31 – 2017-02-01 (×2): 30 mg via ORAL
  Filled 2017-01-30 (×2): qty 1

## 2017-01-30 MED ORDER — LOSARTAN POTASSIUM-HCTZ 50-12.5 MG PO TABS: 1.0000 | ORAL_TABLET | Freq: Every day | ORAL | Status: DC

## 2017-01-30 MED ORDER — LOSARTAN POTASSIUM 50 MG PO TABS
50.0000 mg | ORAL_TABLET | Freq: Every day | ORAL | Status: DC
  Administered 2017-01-31 – 2017-02-01 (×2): 50 mg via ORAL
  Filled 2017-01-30 (×2): qty 1

## 2017-01-30 MED ORDER — IPRATROPIUM-ALBUTEROL 0.5-2.5 (3) MG/3ML IN SOLN
3.0000 mL | Freq: Once | RESPIRATORY_TRACT | Status: AC
  Administered 2017-01-30: 3 mL via RESPIRATORY_TRACT
  Filled 2017-01-30: qty 3

## 2017-01-30 MED ORDER — HYDROCODONE-ACETAMINOPHEN 5-325 MG PO TABS
1.0000 | ORAL_TABLET | ORAL | Status: DC | PRN
  Administered 2017-01-30 – 2017-02-01 (×4): 1 via ORAL
  Filled 2017-01-30 (×5): qty 1

## 2017-01-30 MED ORDER — HYDROCODONE-ACETAMINOPHEN 5-325 MG PO TABS: 1.0000 | ORAL_TABLET | Freq: Three times a day (TID) | ORAL | 0 refills | Status: DC | PRN

## 2017-01-30 NOTE — ED Provider Notes (Signed)
[facility] Emergency Department Provider Note   ____________________________________________   First MD Initiated Contact with Patient 01/30/17 1519     (approximate)  I have reviewed the triage vital signs and the nursing notes.   HISTORY  Chief Complaint Shortness of Breath    HPI Craig Colomb. is a 66 y.o. male patient went to the cancer center today and was noted to have a low oxygen saturation 77% on his usual 3 L of oxygen. He had been seen by the pulmonologist on Monday and started on prednisone and breathing treatments thought he was getting better. Patient is no longer wheezing but has great large amount of rhonchi. His oxygen level was low and he required 4 L oxygen to bump it up.   Past Medical History:  Diagnosis Date  . Asthma   . Cancer ([place])   . Colon cancer ([place])   . COPD (chronic obstructive pulmonary disease) ([place])   . Diabetes mellitus without complication ([place])   . Difficulty sleeping   . Dysrhythmia   . GERD (gastroesophageal reflux disease)   . Glaucoma   . History of bronchitis   . Hypertension   . Lung cancer ([place])    right upper lobe mass positive for adenocarcinoma  . Lymphoma (H[facility])    low grade  . Renal cancer ([place])   . Respiratory failure ([place]) 07/12/2010   acute  . Right renal mass   . Shortness of breath dyspnea   . Stroke ([place]) 1990'S   NO RESIDUAL PROBLEMS  . Supplemental oxygen dependent    USES 3 LITERS CONTINUOUSLY    Patient Active Problem List   Diagnosis Date Noted  . Sepsis ([place]) 12/07/2016  . Portacath in place 11/05/2016  . Cancer associated pain 08/22/2016  . Anemia in neoplastic disease 08/22/2016  . Tachycardia 08/22/2016  . 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) ([place]) 08/22/2016  . Chronic bronchitis ([place]) 06/02/2016  . Chronic renal insufficiency 06/02/2016  . Benign essential HTN 06/02/2016  . Diabetes ([place]) 06/02/2016  . Primary cancer of right upper lobe of  lung ([place]) 06/01/2016  . Renal carcinoma ([place]) 02/29/2016  . Cancer of lung ([place]) 12/21/2015  . COPD exacerbation ([place]) 11/19/2015  . Acute bronchitis 11/19/2015  . SOB (shortness of breath) 11/19/2015  . Acute respiratory distress 11/19/2015  . Glaucoma 04/15/2015  . 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 BP (high blood pressure) 03/02/2015  . Cannot sleep 03/02/2015  . Controlled type 2 diabetes mellitus without complication ([place]) 59/16/3846  . Lung cancer, upper lobe ([place]) 02/03/2015  . Renal cell cancer ([place]) 02/03/2015  . Lymphoma, small lymphocytic ([place]) 02/03/2015    Past Surgical History:  Procedure Laterality Date  . IR GENERIC HISTORICAL  05/31/2016   IR RADIOLOGIST EVAL & MGMT 05/31/2016 Aletta Edouard, MD [facility] INTERV RAD    Prior to Admission medications   Medication Sig Start Date End Date Taking? Authorizing Provider  albuterol (PROVENTIL HFA;VENTOLIN HFA) 108 (90 BASE) MCG/ACT inhaler Inhale 2 puffs into the lungs every 6 (six) hours as needed for wheezing or shortness of breath.    Yes Historical Provider, MD  albuterol (PROVENTIL) (2.5 MG/3ML) 0.083% nebulizer solution USE 1 VIAL IN NEBULIZER EVERY 3-4 HOURS AS NEEDED 10/10/16  Yes [name], MD  budesonide-formoterol (SYMBICORT) 160-4.5 MCG/ACT inhaler Inhale 2 puffs into the lungs 2 (two) times daily.    Yes Historical Provider, MD  diphenhydrAMINE (BENADRYL) 25 MG tablet Take 2 tablets (50 mg total) by  mouth as directed. Patient taking differently: Take 25 mg by mouth as needed for itching or allergies.  04/12/16  Yes Aletta Edouard, MD  HYDROcodone-acetaminophen (NORCO/VICODIN) 5-325 MG tablet Take 1 tablet by mouth every 8 (eight) hours as needed for moderate pain. Please Note new directions 01/30/17  Yes [name], MD  loperamide (IMODIUM A-D) 2 MG tablet Take 2 mg by mouth 4 (four) times daily as needed for diarrhea or loose stools. Reported on 12/21/2015   Yes Historical Provider, MD    losartan-hydrochlorothiazide (HYZAAR) 50-12.5 MG per tablet Take 1 tablet by mouth daily.   Yes Historical Provider, MD  pioglitazone (ACTOS) 30 MG tablet Take 30 mg by mouth daily.   Yes Historical Provider, MD  predniSONE (DELTASONE) 10 MG tablet TAKE 1 TABLET (10 MG TOTAL) BY MOUTH DAILY WITH BREAKFAST. 11/30/16  Yes [name], MD  temazepam (RESTORIL) 15 MG capsule Take 15 mg by mouth at bedtime as needed.  08/07/16  Yes Historical Provider, MD  tiotropium (SPIRIVA) 18 MCG inhalation capsule Place 18 mcg into inhaler and inhale daily.    Yes Historical Provider, MD  Acalabrutinib 100 MG CAPS Take 100 mg by mouth every 12 (twelve) hours. Patient not taking: Reported on 01/30/2017 01/04/17   [name], MD    Allergies Iodinated diagnostic agents and Nexium [esomeprazole magnesium]  Family History  Problem Relation Age of Onset  . Diabetes Mellitus II Mother   . Hypertension Mother   . Cancer Mother   . Heart disease Father   . Hypertension Father   . Tuberculosis Father   . Diabetes Mellitus II Sister   . Cancer Sister   . Prostate cancer Brother     Social History Social History  Substance Use Topics  . Smoking status: Former Smoker    Packs/day: 1.00    Years: 30.00    Types: Cigarettes    Start date: 12/28/1968    Quit date: 01/13/2010  . Smokeless tobacco: Never Used  . Alcohol use No     Comment: stopped 2002     Review of Systems Constitutional: No fever/chills Eyes: No visual changes. ENT: No sore throat. Cardiovascular: Patient complains of some chest tightness. Respiratory:shortness of breath. Gastrointestinal: No abdominal pain.  No nausea, no vomiting.  No diarrhea.  No constipation. Genitourinary: Negative for dysuria. Musculoskeletal: Negative for back pain. Skin: Negative for rash. N 10-point ROS otherwise negative.  ____________________________________________   PHYSICAL EXAM:  VITAL SIGNS: ED Triage Vitals  Enc Vitals  Group     BP 01/30/17 1508 123/75     Pulse Rate 01/30/17 1508 (!) 108     Resp 01/30/17 1508 16     Temp 01/30/17 1508 98.8 F (37.1 C)     Temp Source 01/30/17 1508 Oral     SpO2 01/30/17 1508 100 %     Weight 01/30/17 1508 139 lb (63 kg)     Height 01/30/17 1508 '5\' 8"'$  (1.727 m)     Head Circumference --      Peak Flow --      Pain Score 01/30/17 1518 6     Pain Loc --      Pain Edu? --      Excl. in [place]? --     Constitutional: Alert and oriented. Well appearing and in no acute distress. Eyes: Conjunctivae are normal. PERRL. EOMI. Head: Atraumatic. Nose: No congestion/rhinnorhea. Mouth/Throat: Mucous membranes are moist.  Oropharynx non-erythematous. Neck: No stridor.   Cardiovascular: Normal rate,  regular rhythm. Grossly normal heart sounds.  Good peripheral circulation. Respiratory: Increased respiratory effort.  No retractions. Lungs rhonchi throughout especially in the bases Gastrointestinal: Soft and nontender. No distention. No abdominal bruits. No CVA tenderness. Musculoskeletal: No lower extremity tenderness nor edema.  No joint effusions. Neurologic:  Normal speech and language. No gross focal neurologic deficits are appreciated. No gait instability.  ____________________________________________   LABS (all labs ordered are listed, but only abnormal results are displayed)  Labs Reviewed  CBC WITH DIFFERENTIAL/PLATELET - Abnormal; Notable for the following:       Result Value   RBC 3.25 (*)    Hemoglobin 9.0 (*)    HCT 28.4 (*)    MCHC 31.7 (*)    RDW 15.5 (*)    Lymphs Abs 0.2 (*)    All other components within normal limits  BASIC METABOLIC PANEL - Abnormal; Notable for the following:    Potassium 5.3 (*)    Chloride 96 (*)    CO2 38 (*)    Glucose, Bld 168 (*)    BUN 33 (*)    Creatinine, Ser 1.28 (*)    Calcium 8.5 (*)    GFR calc non Af Amer 57 (*)    All other components within normal limits  BRAIN NATRIURETIC PEPTIDE  LACTIC ACID, PLASMA   LACTIC ACID, PLASMA  FIBRIN DERIVATIVES D-DIMER ([facility] ONLY)   ____________________________________________  EKG  EKG read and interpreted by me shows sinus tach with a rate of 107 normal axis no acute ST-T wave changes there is one PVC ____________________________________________  RADIOLOGY  Study Result   CLINICAL DATA:  Two days of shortness of breath, suspect COPD exacerbation, history of right lung malignancy, asthma -COPD, former smoker.  EXAM: CHEST  2 VIEW  COMPARISON:  Portable chest x-ray of December 08, 2016  FINDINGS: The lungs remain hyperinflated. There is no focal infiltrate. There is no pleural effusion or pneumothorax. There is stable parenchymal density in the right upper lobe. The heart and pulmonary vascularity are normal. There is calcification in the wall of the aortic arch. The porta catheter tip projects over the midportion of the SVC. Old rib fractures are present bilaterally.  IMPRESSION: COPD. Chronic changes in the right hilar region and right upper lobe. No acute pneumonia, pneumothorax, or pulmonary parenchymal mass. No CHF.  Thoracic aortic atherosclerosis.   Electronically Signed   By: David  Martinique M.D.   On: 01/30/2017 16:15    ____________________________________________   PROCEDURES  Procedure(s) performed:  Procedures  Critical Care performed:   ____________________________________________   INITIAL IMPRESSION / ASSESSMENT AND PLAN / ED COURSE  Pertinent labs & imaging results that were available during my care of the patient were reviewed by me and considered in my medical decision making (see chart for details).    after 2 DuoNeb since I Medrol patient remains tachycardic into And working harder to breathe although his oxygen saturations are higher now on 4 L. Still I think this is oxygen saturation were very low earlier and he still is uncomfortable the best thing to do would be to admit  him.   ____________________________________________   FINAL CLINICAL IMPRESSION(S) / ED DIAGNOSES  Final diagnoses:  COPD exacerbation ([place])      NEW MEDICATIONS STARTED DURING THIS VISIT:  New Prescriptions   No medications on file     Note:  This document was prepared using Dragon voice recognition software and may include unintentional dictation errors.    Nena Polio, MD  01/30/17 1735  

## 2017-01-30 NOTE — ED Notes (Signed)
Pt reports that he has shortness of breath  - COPD flair per oncologist while he was at oncology appt today - pt reports that this weekend he started with increased shortness of breath - he was seen Monday by PCP and given neb tx and steroid injection and steroids to take at home but has had no relief - pt has auditory wheezing on expiration - pt is chronically on 3L of O2 via n/c but today is requiring 4L to maintain adequate O2 sat

## 2017-01-30 NOTE — ED Triage Notes (Signed)
Sent to ED from cancer center due to o2 sat of 3L-- 77%.  Seen by Pulmonologist on Monday and started on prednisone and breathing treatments.  Patient states breathing has improved.    Sats 3L/ [place] in triage-- 100%

## 2017-01-30 NOTE — Progress Notes (Signed)
[facility] OFFICE PROGRESS NOTE  Patient Care Team: [name], MD as PCP - General (Family Medicine)  Cancer Staging Lung cancer, upper lobe [facility]) Staging form: Lung, AJCC 7th Edition - Clinical stage from 06/15/2010: yT2, N2, M0 - Signed by [name], NP on 02/03/2015 - Pathologic stage from 04/04/2014: yT2, N2, M1 - Signed by [name], NP on 02/03/2015  Lymphoma, small lymphocytic ([place]) Staging form: Lymphoid Neoplasms, AJCC 6th Edition - Clinical stage from 10/15/2013: Stage II - Signed by [name], NP on 02/03/2015  Renal cell cancer [facility]) Staging form: Kidney, AJCC 7th Edition - Clinical stage from 02/03/2015: Stage I (yT1a, N0, M0) - Signed by [name], NP on 02/03/2015    Oncology History   1. Right upper lobe lung mass biopsies positive for adenocarcinoma; PET- T2, N2, M0;  stage IIIA;  carboplatinum Alimta and Avastin;  [april  9th of 2013]  Patient had an allergic reaction to carboplatinum with acute bronchospasm in May of 2013; carbo-discontinued.; Maintenance Alimta and Avastin  from June of 2013 hold chemotherapy because of increasing shortness of breath (in July, 2013); VARISTRAT-GOOD; Started on [place] February 3 about 2014; Diarrhea 4 days after Tarceva was started.  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   # SEP 2017- GAZYVA x1 [stopped sec to respi issues]  # Nov 1st week- START Ibrutinib 2 pills day; Last Jan 22nd 2018 [sec to co-pay].   # FEB 19th- cycle #1 Bendamustine [day-1& 2- '70mg'$ /m2; Rituxan with cycle #2  # RIGHT KIDNEY CA- RCC [s/p Bx]- s/p Cryo  [Dr.Yamagat; IR; 2016]   #  Poor pulmonary function; 3 L home O2.     Lung cancer, upper lobe ([place])   02/03/2015 Initial Diagnosis    Lung cancer, upper lobe       Lymphoma, small lymphocytic ([place])   02/03/2015 Initial Diagnosis    Lymphoma, small lymphocytic (HCC)      Cancer of lung ([place])   12/21/2015 Initial Diagnosis    Cancer of lung ([place])      Primary cancer of right upper lobe of lung ([place])   06/01/2016 Initial Diagnosis    Primary cancer of right upper lobe of lung (HCC)       INTERVAL HISTORY:  Craig [name]. 66 y.o.  male pleasant patientHistory of chronic respiratory failure on home O2/Severe COPD with above history of Multiple malignancies- most recently with progressive SLL- Currently on surveillance because of poor tolerance to bendamustine; and also improvement noted discontinued because of financial issues.  Patient states that she was recently evaluated by his pulmonologist Dr. Raul Del for worsening shortness of breath; needed IV steroids and also needed breathing treatments. Patient states he is on steroids. However in the last few days he noted to have worsening shortness of breath and cough. No hemoptysis. No fevers or chills. Positive for phlegm.  Continues to have chronic chest wall pain- for which is on narcotic pain medication. He denies any weight loss. Denies any night sweats.   REVIEW OF SYSTEMS:  A complete 10 point review  of system is done which is negative except mentioned above/history of present illness.   PAST MEDICAL HISTORY :  Past Medical History:  Diagnosis Date  . Asthma   . Cancer ([place])   . Colon cancer ([place])   . COPD (chronic obstructive pulmonary disease) ([place])   . Diabetes mellitus without complication ([place])   . Difficulty sleeping   . Dysrhythmia   . GERD (gastroesophageal reflux disease)   . Glaucoma   . History of bronchitis   . Hypertension   . Lung cancer ([place])    right upper lobe mass positive for adenocarcinoma  .  Lymphoma (HCC)    low grade  . Renal cancer ([place])   . Respiratory failure ([place]) 07/12/2010   acute  . Right renal mass   . Shortness of breath dyspnea   . Stroke ([place]) 1990'S   NO RESIDUAL PROBLEMS  . Supplemental oxygen dependent    USES 3 LITERS CONTINUOUSLY    PAST SURGICAL HISTORY :   Past Surgical History:  Procedure Laterality Date  . IR GENERIC HISTORICAL  05/31/2016   IR RADIOLOGIST EVAL & MGMT 05/31/2016 Aletta Edouard, MD [facility] INTERV RAD    FAMILY HISTORY :   Family History  Problem Relation Age of Onset  . Diabetes Mellitus II Mother   . Hypertension Mother   . Cancer Mother   . Heart disease Father   . Hypertension Father   . Tuberculosis Father   . Diabetes Mellitus II Sister   . Cancer Sister   . Prostate cancer Brother     SOCIAL HISTORY:   Social History  Substance Use Topics  . Smoking status: Former Smoker    Packs/day: 1.00    Years: 30.00    Types: Cigarettes    Start date: 12/28/1968    Quit date: 01/13/2010  . Smokeless tobacco: Never Used  . Alcohol use No     Comment: stopped 2002     ALLERGIES:  is allergic to iodinated diagnostic agents and nexium [esomeprazole magnesium].  MEDICATIONS:  No current facility-administered medications for this visit.    Current Outpatient Prescriptions  Medication Sig Dispense Refill  . Acalabrutinib 100 MG CAPS Take 100 mg by mouth every 12 (twelve) hours. 60 capsule 4  . albuterol (PROVENTIL HFA;VENTOLIN HFA) 108 (90 BASE) MCG/ACT inhaler Inhale 2 puffs into the lungs every 6 (six) hours as needed for wheezing or shortness of breath.     Marland Kitchen albuterol (PROVENTIL) (2.5 MG/3ML) 0.083% nebulizer solution USE 1 VIAL IN NEBULIZER EVERY 3-4 HOURS AS NEEDED 75 mL 3  . budesonide-formoterol (SYMBICORT) 160-4.5 MCG/ACT inhaler Inhale 2 puffs into the lungs 2 (two) times daily.     . diphenhydrAMINE (BENADRYL) 25 MG tablet Take 2 tablets (50 mg total) by mouth as directed. (Patient taking differently: Take 25 mg by  mouth as needed for itching or allergies. ) 2 tablet 0  . HYDROcodone-acetaminophen (NORCO/VICODIN) 5-325 MG tablet Take 1 tablet by mouth every 8 (eight) hours as needed for moderate pain. Please Note new directions 90 tablet 0  . loperamide (IMODIUM A-D) 2 MG tablet Take 2 mg by mouth 4 (four) times daily as needed for diarrhea or loose stools. Reported on 12/21/2015    . losartan-hydrochlorothiazide (HYZAAR) 50-12.5 MG per tablet Take 1 tablet by mouth daily.    . pioglitazone (ACTOS) 30 MG tablet Take 30 mg by mouth daily.    . predniSONE (DELTASONE) 10 MG tablet TAKE 1 TABLET (10 MG TOTAL) BY MOUTH DAILY  WITH BREAKFAST. 30 tablet 3  . temazepam (RESTORIL) 15 MG capsule Take 15 mg by mouth at bedtime as needed.     . tiotropium (SPIRIVA) 18 MCG inhalation capsule Place 18 mcg into inhaler and inhale daily.     Marland Kitchen zolpidem (AMBIEN) 5 MG tablet Take 1 or 2 tabs at night if needed     Facility-Administered Medications Ordered in Other Visits  Medication Dose Route Frequency Provider Last Rate Last Dose  . sodium chloride 0.9 % injection 10 mL  10 mL Intracatheter PRN [place] Gleason, MD   10 mL at 04/28/15 1410  . sodium chloride 0.9 % injection 10 mL  10 mL Intravenous PRN [place] Gleason, MD   10 mL at 05/12/15 1350    PHYSICAL EXAMINATION: ECOG PERFORMANCE STATUS: 2 - Symptomatic, <50% confined to bed  BP 123/75   Pulse (!) 104   Temp 97.8 F (36.6 C) (Tympanic)   Resp (!) 26   SpO2 (!) 82%   There were no vitals filed for this visit.  GENERAL: Well-nourished well-developed; Alert, no distress and comfortable.   Alone. He is on home oxygen. He is in a wheelchair.  EYES: no pallor or icterus OROPHARYNX: no thrush or ulceration; good dentition  NECK: supple, no masses felt LYMPH:  no palpable lymphadenopathy in the cervical, axillary or inguinal regions LUNGS:Bilateral decreased air entry to auscultationn and  No wheeze or crackles HEART/CVS: regular rate & rhythm and no murmurs; No lower  extremity edema ABDOMEN:abdomen soft, non-tender and normal bowel sounds Musculoskeletal:no cyanosis of digits and no clubbing  PSYCH: alert & oriented x 3 with fluent speech NEURO: no focal motor/sensory deficits SKIN:  no rashes or significant lesions  LABORATORY DATA:  I have reviewed the data as listed    Component Value Date/Time   NA 140 01/30/2017 1511   NA 139 01/17/2015 0913   K 5.3 (H) 01/30/2017 1511   K 3.8 01/17/2015 0913   CL 96 (L) 01/30/2017 1511   CL 99 (L) 01/17/2015 0913   CO2 38 (H) 01/30/2017 1511   CO2 35 (H) 01/17/2015 0913   GLUCOSE 168 (H) 01/30/2017 1511   GLUCOSE 169 (H) 01/17/2015 0913   BUN 33 (H) 01/30/2017 1511   BUN 15 01/17/2015 0913   CREATININE 1.28 (H) 01/30/2017 1511   CREATININE 1.23 01/17/2015 0913   CALCIUM 8.5 (L) 01/30/2017 1511   CALCIUM 8.8 (L) 01/17/2015 0913   PROT 6.9 01/30/2017 1405   PROT 6.6 01/17/2015 0913   ALBUMIN 4.1 01/30/2017 1405   ALBUMIN 3.7 01/17/2015 0913   AST 17 01/30/2017 1405   AST 20 01/17/2015 0913   ALT 15 (L) 01/30/2017 1405   ALT 15 (L) 01/17/2015 0913   ALKPHOS 76 01/30/2017 1405   ALKPHOS 76 01/17/2015 0913   BILITOT 0.4 01/30/2017 1405   BILITOT 0.5 01/17/2015 0913   GFRNONAA 57 (L) 01/30/2017 1511   GFRNONAA >60 01/17/2015 0913   GFRAA >60 01/30/2017 1511   GFRAA >60 01/17/2015 0913    No results found for: SPEP, UPEP  Lab Results  Component Value Date   WBC 5.8 01/30/2017   NEUTROABS 5.3 01/30/2017   HGB 9.0 (L) 01/30/2017   HCT 28.4 (L) 01/30/2017   MCV 87.3 01/30/2017   PLT 190 01/30/2017      Chemistry      Component Value Date/Time   NA 140 01/30/2017 1511   NA 139 01/17/2015 0913   K 5.3 (H) 01/30/2017 1511   K 3.8  01/17/2015 0913   CL 96 (L) 01/30/2017 1511   CL 99 (L) 01/17/2015 0913   CO2 38 (H) 01/30/2017 1511   CO2 35 (H) 01/17/2015 0913   BUN 33 (H) 01/30/2017 1511   BUN 15 01/17/2015 0913   CREATININE 1.28 (H) 01/30/2017 1511   CREATININE 1.23 01/17/2015 0913       Component Value Date/Time   CALCIUM 8.5 (L) 01/30/2017 1511   CALCIUM 8.8 (L) 01/17/2015 0913   ALKPHOS 76 01/30/2017 1405   ALKPHOS 76 01/17/2015 0913   AST 17 01/30/2017 1405   AST 20 01/17/2015 0913   ALT 15 (L) 01/30/2017 1405   ALT 15 (L) 01/17/2015 0913   BILITOT 0.4 01/30/2017 1405   BILITOT 0.5 01/17/2015 0913       RADIOGRAPHIC STUDIES: I have personally reviewed the radiological images as listed and agreed with the findings in the report. No results found.   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 Hemoglobin approximately 9.6. Discontinued ibrutinib sec to financial issues. Bendamustine discontinued because of side effects. For now continue surveillance. We'll repeat the scan few months again.  # Advanced COPD- question exacerbation. Patient's pulse ox on 4 L 70-80%. Patient will likely more steroids; breathing treatments. Patient to go to the emergency room.  # Pain- chest when coughing- continue pain meds prn. New script given.   # follow up with me in 4 weeks/labs.    Orders Placed This Encounter  Procedures  . Oxygen therapy Mode or (Route): Nasal cannula; Liters Per Minute: 4; Keep 02 saturation: 90    Standing Status:   Standing    Number of Occurrences:   1    Order Specific Question:   Mode or (Route)    Answer:   Nasal cannula    Order Specific Question:   Liters Per Minute    Answer:   4    Order Specific Question:   Keep 02 saturation    Answer:   [place], MD 01/30/2017 4:16 PM

## 2017-01-30 NOTE — H&P (Signed)
[facility] [place] NAME: Craig [name]    MR#:  [id number]  DATE OF BIRTH:  07-17-1951  DATE OF ADMISSION:  01/30/2017  PRIMARY CARE PHYSICIAN: [name], MD   REQUESTING/REFERRING PHYSICIAN: Dr. Cinda Quest  CHIEF COMPLAINT:   Chief Complaint  Patient presents with  . Shortness of Breath    HISTORY OF PRESENT ILLNESS:  Craig [name]  is a 66 y.o. male with a known history of Hypertension, diabetes, COPD, CKD stage III, lung cancer presents to the emergency room from the cancer center after he was noticed to have saturations the 70s on 3 status oxygen in the office. Patient saw Dr. Raul Del a few days back with shortness of breath and was placed on prednisone. His breathing did not improve and urine the emergency room in spite of IV steroids, nebulizers and patient has shortness of breath, wheezing and needing 4 L oxygen and is being admitted to the hospitalist service. No pneumonia on chest x-ray. Afebrile. Yellow sputum. No orthopnea or edema. No rash.  PAST MEDICAL HISTORY:   Past Medical History:  Diagnosis Date  . Asthma   . Cancer ([place])   . Colon cancer ([place])   . COPD (chronic obstructive pulmonary disease) ([place])   . Diabetes mellitus without complication ([place])   . Difficulty sleeping   . Dysrhythmia   . GERD (gastroesophageal reflux disease)   . Glaucoma   . History of bronchitis   . Hypertension   . Lung cancer ([place])    right upper lobe mass positive for adenocarcinoma  . Lymphoma (HCC)    low grade  . Renal cancer ([place])   . Respiratory failure ([place]) 07/12/2010   acute  . Right renal mass   . Shortness of breath dyspnea   . Stroke ([place]) 1990'S   NO RESIDUAL PROBLEMS  . Supplemental oxygen dependent    USES 3 LITERS CONTINUOUSLY    PAST SURGICAL HISTORY:   Past Surgical History:  Procedure Laterality Date  . IR GENERIC HISTORICAL  05/31/2016   IR RADIOLOGIST EVAL & MGMT 05/31/2016 Aletta Edouard, MD [facility] INTERV  RAD    SOCIAL HISTORY:   Social History  Substance Use Topics  . Smoking status: Former Smoker    Packs/day: 1.00    Years: 30.00    Types: Cigarettes    Start date: 12/28/1968    Quit date: 01/13/2010  . Smokeless tobacco: Never Used  . Alcohol use No     Comment: stopped 2002     FAMILY HISTORY:   Family History  Problem Relation Age of Onset  . Diabetes Mellitus II Mother   . Hypertension Mother   . Cancer Mother   . Heart disease Father   . Hypertension Father   . Tuberculosis Father   . Diabetes Mellitus II Sister   . Cancer Sister   . Prostate cancer Brother     DRUG ALLERGIES:   Allergies  Allergen Reactions  . Iodinated Diagnostic Agents Shortness Of Breath and Nausea And Vomiting    13-hour prep  . Nexium [Esomeprazole Magnesium] Other (See Comments)    headache    REVIEW OF SYSTEMS:   Review of Systems  Constitutional: Positive for malaise/fatigue. Negative for chills, fever and weight loss.  HENT: Negative for hearing loss and nosebleeds.   Eyes: Negative for blurred vision, double vision and pain.  Respiratory: Positive for cough, shortness of breath and wheezing. Negative for hemoptysis and sputum production.   Cardiovascular: Positive  for chest pain (chronic). Negative for palpitations, orthopnea and leg swelling.  Gastrointestinal: Negative for abdominal pain, constipation, diarrhea, nausea and vomiting.  Genitourinary: Negative for dysuria and hematuria.  Musculoskeletal: Negative for back pain, falls and myalgias.  Skin: Negative for rash.  Neurological: Positive for weakness. Negative for dizziness, tremors, sensory change, speech change, focal weakness, seizures and headaches.  Endo/Heme/Allergies: Does not bruise/bleed easily.  Psychiatric/Behavioral: Negative for depression and memory loss. The patient is not nervous/anxious.     MEDICATIONS AT HOME:   Prior to Admission medications   Medication Sig Start Date End Date Taking?  Authorizing Provider  albuterol (PROVENTIL HFA;VENTOLIN HFA) 108 (90 BASE) MCG/ACT inhaler Inhale 2 puffs into the lungs every 6 (six) hours as needed for wheezing or shortness of breath.    Yes Historical Provider, MD  albuterol (PROVENTIL) (2.5 MG/3ML) 0.083% nebulizer solution USE 1 VIAL IN NEBULIZER EVERY 3-4 HOURS AS NEEDED 10/10/16  Yes [name], MD  budesonide-formoterol (SYMBICORT) 160-4.5 MCG/ACT inhaler Inhale 2 puffs into the lungs 2 (two) times daily.    Yes Historical Provider, MD  diphenhydrAMINE (BENADRYL) 25 MG tablet Take 2 tablets (50 mg total) by mouth as directed. Patient taking differently: Take 25 mg by mouth as needed for itching or allergies.  04/12/16  Yes Aletta Edouard, MD  HYDROcodone-acetaminophen (NORCO/VICODIN) 5-325 MG tablet Take 1 tablet by mouth every 8 (eight) hours as needed for moderate pain. Please Note new directions 01/30/17  Yes [name], MD  loperamide (IMODIUM A-D) 2 MG tablet Take 2 mg by mouth 4 (four) times daily as needed for diarrhea or loose stools. Reported on 12/21/2015   Yes Historical Provider, MD  losartan-hydrochlorothiazide (HYZAAR) 50-12.5 MG per tablet Take 1 tablet by mouth daily.   Yes Historical Provider, MD  pioglitazone (ACTOS) 30 MG tablet Take 30 mg by mouth daily.   Yes Historical Provider, MD  predniSONE (DELTASONE) 10 MG tablet TAKE 1 TABLET (10 MG TOTAL) BY MOUTH DAILY WITH BREAKFAST. 11/30/16  Yes [name], MD  temazepam (RESTORIL) 15 MG capsule Take 15 mg by mouth at bedtime as needed.  08/07/16  Yes Historical Provider, MD  tiotropium (SPIRIVA) 18 MCG inhalation capsule Place 18 mcg into inhaler and inhale daily.    Yes Historical Provider, MD  Acalabrutinib 100 MG CAPS Take 100 mg by mouth every 12 (twelve) hours. Patient not taking: Reported on 01/30/2017 01/04/17   [name], MD     VITAL SIGNS:  Blood pressure 128/73, pulse (!) 105, temperature 98.8 F (37.1 C), temperature source  Oral, resp. rate (!) 35, height '5\' 8"'$  (1.727 m), weight 63 kg (139 lb), SpO2 92 %.  PHYSICAL EXAMINATION:  Physical Exam  GENERAL:  66 y.o.-year-old patient lying in the bed With conversational dyspnea EYES: Pupils equal, round, reactive to light and accommodation. No scleral icterus. Extraocular muscles intact.  HEENT: Head atraumatic, normocephalic. Oropharynx and nasopharynx clear. No oropharyngeal erythema, moist oral mucosa  NECK:  Supple, no jugular venous distention. No thyroid enlargement, no tenderness.  LUNGS: Increased work of breathing. Bilateral wheezing. Decreased air entry. CARDIOVASCULAR: S1, S2 normal. No murmurs, rubs, or gallops.  ABDOMEN: Soft, nontender, nondistended. Bowel sounds present. No organomegaly or mass.  EXTREMITIES: No pedal edema, cyanosis, or clubbing. + 2 pedal & radial pulses b/l.   NEUROLOGIC: Cranial nerves II through XII are intact. No focal Motor or sensory deficits appreciated b/l PSYCHIATRIC: The patient is alert and oriented x 3. Good affect.  SKIN: No  obvious rash, lesion, or ulcer.   LABORATORY PANEL:   CBC  Recent Labs Lab 01/30/17 1511  WBC 5.8  HGB 9.0*  HCT 28.4*  PLT 190   ------------------------------------------------------------------------------------------------------------------  Chemistries   Recent Labs Lab 01/30/17 1405 01/30/17 1511  NA 139 140  K 4.8 5.3*  CL 96* 96*  CO2 36* 38*  GLUCOSE 186* 168*  BUN 33* 33*  CREATININE 1.33* 1.28*  CALCIUM 8.5* 8.5*  AST 17  --   ALT 15*  --   ALKPHOS 76  --   BILITOT 0.4  --    ------------------------------------------------------------------------------------------------------------------  Cardiac Enzymes No results for input(s): TROPONINI in the last 168 hours. ------------------------------------------------------------------------------------------------------------------  RADIOLOGY:  Dg Chest 2 View  Result Date: 01/30/2017 CLINICAL DATA:  Two days of  shortness of breath, suspect COPD exacerbation, history of right lung malignancy, asthma -COPD, former smoker. EXAM: CHEST  2 VIEW COMPARISON:  Portable chest x-ray of December 08, 2016 FINDINGS: The lungs remain hyperinflated. There is no focal infiltrate. There is no pleural effusion or pneumothorax. There is stable parenchymal density in the right upper lobe. The heart and pulmonary vascularity are normal. There is calcification in the wall of the aortic arch. The porta catheter tip projects over the midportion of the SVC. Old rib fractures are present bilaterally. IMPRESSION: COPD. Chronic changes in the right hilar region and right upper lobe. No acute pneumonia, pneumothorax, or pulmonary parenchymal mass. No CHF. Thoracic aortic atherosclerosis. Electronically Signed   By: David  [place]ique M.D.   On: 01/30/2017 16:15     IMPRESSION AND PLAN:   * Acute COPD exacerbation Failed outpatient treatment -IV steroids, Antibiotics - Scheduled Nebulizers - Inhalers -Wean O2 as tolerated - Consult pulmonary if no improvement  * Acute on chronic respiratory failure due to COPD exacerbation. Nebs when necessary and scheduled. Wean oxygen as tolerated.  * Lung cancer. Presently undergoing chemotherapy at [facility]  * Hypertension. Continue home medications.  * Diabetes mellitus. Add sliding scale insulin. Continue home medications. Will likely run high due to IV steroids.  * CKD stage III stable.  * DVT prophylaxis with Lovenox.  All the records are reviewed and case discussed with ED provider. Management plans discussed with the patient, family and they are in agreement.  CODE STATUS: FULL CODE  TOTAL TIME TAKING CARE OF THIS PATIENT: 40 minutes.   Hillary Bow R M.D on 01/30/2017 at 6:22 PM  Between 7am to 6pm - Pager - [phone]  After 6pm go to [email or web address] - password [email or web address] [facility]  Office  [phone]  CC: Primary care physician; [name], MD  Note: This dictation was prepared with Dragon dictation along with smaller phrase technology. Any transcriptional errors that result from this process are unintentional.

## 2017-01-30 NOTE — Progress Notes (Signed)
Patient here for follow-up for lymphoma. h/o COPD.  Pt presented to clinic with extreme dyspnea while sitting in w/c. Patient on home oxygen tank at 3L. Pt stood from w/c to obtain weight at scale pt started gasping for breath and pt had an audible wheeze. Oxygen sats noted to be in 70's on 3L's.  Per md order- placed pt on 4L oxygen- saturations. Saturations only improved to 84% on 4L.  Per md, pt brought to ED via w/c by RN. Phone call Hand off provided to [facility], RN in ER prior to transport.

## 2017-01-30 NOTE — ED Notes (Signed)
This RN answered call light for pt in room 16.  Pt sts he had called out because nebulizer was finished.  Explained that while no liquid in bottom of nebulizer, this RN could still see vapors being emitted from nebulizer and that pt should continue to use until no more vapor was visible.  Pts family became irate, stated that "this hospital doesn't care about anyone".  This RN was walking to door when pt called out that this RN had not hooked pt back to O2, this RN hooked pt back to 3L (pt sts that this was his baseline) and apologized.  Pts family still irate, charge RN informed

## 2017-01-30 NOTE — Assessment & Plan Note (Signed)
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 Hemoglobin approximately 9.6. Discontinued ibrutinib sec to financial issues. Bendamustine discontinued because of side effects. For now continue surveillance. We'll repeat the scan few months again.  # Advanced COPD- question exacerbation. Patient's pulse ox on 4 L 70-80%. Patient will likely more steroids; breathing treatments. Patient to go to the emergency room.  # Pain- chest when coughing- continue pain meds prn. New script given.   # follow up with me in 4 weeks/labs.

## 2017-01-31 ENCOUNTER — Inpatient Hospital Stay: Payer: Medicare HMO

## 2017-01-31 LAB — BASIC METABOLIC PANEL
ANION GAP: 5 (ref 5–15)
BUN: 31 mg/dL — AB (ref 6–20)
CO2: 40 mmol/L — ABNORMAL HIGH (ref 22–32)
Calcium: 8.3 mg/dL — ABNORMAL LOW (ref 8.9–10.3)
Chloride: 97 mmol/L — ABNORMAL LOW (ref 101–111)
Creatinine, Ser: 1.17 mg/dL (ref 0.61–1.24)
GFR calc Af Amer: >60 mL/min (ref >60)
GLUCOSE: 174 mg/dL — AB (ref 65–99)
Potassium: 4.8 mmol/L (ref 3.5–5.1)
SODIUM: 142 mmol/L (ref 135–145)

## 2017-01-31 LAB — CBC
HEMATOCRIT: 26.7 % — AB (ref 40.0–52.0)
Hemoglobin: 8.7 g/dL — ABNORMAL LOW (ref 13.0–18.0)
MCH: 28.6 pg (ref 26.0–34.0)
MCHC: 32.6 g/dL (ref 32.0–36.0)
MCV: 87.7 fL (ref 80.0–100.0)
PLATELETS: 180 10*3/uL (ref 150–440)
RBC: 3.05 MIL/uL — AB (ref 4.40–5.90)
RDW: 14.9 % — ABNORMAL HIGH (ref 11.5–14.5)
WBC: 4.4 10*3/uL (ref 3.8–10.6)

## 2017-01-31 LAB — GLUCOSE, CAPILLARY
GLUCOSE-CAPILLARY: 181 mg/dL — AB (ref 65–99)
GLUCOSE-CAPILLARY: 113 mg/dL — AB (ref 65–99)
Glucose-Capillary: 141 mg/dL — ABNORMAL HIGH (ref 65–99)
Glucose-Capillary: 110 mg/dL — ABNORMAL HIGH (ref 65–99)

## 2017-01-31 MED ORDER — GUAIFENESIN 100 MG/5ML PO SOLN
5.0000 mL | ORAL | Status: DC | PRN
  Administered 2017-01-31 – 2017-02-01 (×4): 100 mg via ORAL
  Filled 2017-01-31 (×4): qty 10

## 2017-01-31 MED ORDER — TECHNETIUM TC 99M DIETHYLENETRIAME-PENTAACETIC ACID
33.0600 | Freq: Once | INTRAVENOUS | Status: AC | PRN
  Administered 2017-01-31: 33.06 via INTRAVENOUS

## 2017-01-31 MED ORDER — LEVOFLOXACIN 500 MG PO TABS
500.0000 mg | ORAL_TABLET | Freq: Every day | ORAL | Status: DC
  Administered 2017-01-31: 500 mg via ORAL
  Filled 2017-01-31: qty 1

## 2017-01-31 MED ORDER — TECHNETIUM TO 99M ALBUMIN AGGREGATED
3.7400 | Freq: Once | INTRAVENOUS | Status: AC | PRN
  Administered 2017-01-31: 3.74 via INTRAVENOUS

## 2017-01-31 NOTE — Progress Notes (Addendum)
[facility] [place] NAME: Craig [name]    MR#:  [id number]  DATE OF BIRTH:  02-Dec-1950  SUBJECTIVE:  Came in with increasing sob and cough. Cont with cough, unable to bring up phlegm  REVIEW OF SYSTEMS:   Review of Systems  Constitutional: Negative for chills, fever and weight loss.  HENT: Negative for ear discharge, ear pain and nosebleeds.   Eyes: Negative for blurred vision, pain and discharge.  Respiratory: Positive for cough and shortness of breath. Negative for sputum production, wheezing and stridor.   Cardiovascular: Negative for chest pain, palpitations, orthopnea and PND.  Gastrointestinal: Negative for abdominal pain, diarrhea, nausea and vomiting.  Genitourinary: Negative for frequency and urgency.  Musculoskeletal: Negative for back pain and joint pain.  Neurological: Positive for weakness. Negative for sensory change, speech change and focal weakness.  Psychiatric/Behavioral: Negative for depression and hallucinations. The patient is not nervous/anxious.    Tolerating Diet: Tolerating PT:   DRUG ALLERGIES:   Allergies  Allergen Reactions  . Iodinated Diagnostic Agents Shortness Of Breath and Nausea And Vomiting    13-hour prep  . Nexium [Esomeprazole Magnesium] Other (See Comments)    headache    VITALS:  Blood pressure 136/67, pulse (!) 110, temperature 98.8 F (37.1 C), temperature source Oral, resp. rate 18, height '5\' 8"'$  (1.727 m), weight 57.9 kg (127 lb 9.6 oz), SpO2 99 %.  PHYSICAL EXAMINATION:   Physical Exam  GENERAL:  66 y.o.-year-old patient lying in the bed with no acute distress.  EYES: Pupils equal, round, reactive to light and accommodation. No scleral icterus. Extraocular muscles intact.  HEENT: Head atraumatic, normocephalic. Oropharynx and nasopharynx clear.  NECK:  Supple, no jugular venous distention. No thyroid enlargement, no tenderness.  LUNGS: distant breath sounds bilaterally, no  wheezing, rales, rhonchi. No use of accessory muscles of respiration.  CARDIOVASCULAR: S1, S2 normal. No murmurs, rubs, or gallops.  ABDOMEN: Soft, nontender, nondistended. Bowel sounds present. No organomegaly or mass.  EXTREMITIES: No cyanosis, clubbing or edema b/l.    NEUROLOGIC: Cranial nerves II through XII are intact. No focal Motor or sensory deficits b/l.   PSYCHIATRIC:  patient is alert and oriented x 3.  SKIN: No obvious rash, lesion, or ulcer.   LABORATORY PANEL:  CBC  Recent Labs Lab 01/31/17 0508  WBC 4.4  HGB 8.7*  HCT 26.7*  PLT 180    Chemistries   Recent Labs Lab 01/30/17 1405  01/31/17 0508  NA 139  < > 142  K 4.8  < > 4.8  CL 96*  < > 97*  CO2 36*  < > 40*  GLUCOSE 186*  < > 174*  BUN 33*  < > 31*  CREATININE 1.33*  < > 1.17  CALCIUM 8.5*  < > 8.3*  AST 17  --   --   ALT 15*  --   --   ALKPHOS 76  --   --   BILITOT 0.4  --   --   < > = values in this interval not displayed. Cardiac Enzymes No results for input(s): TROPONINI in the last 168 hours. RADIOLOGY:  Dg Chest 2 View  Result Date: 01/30/2017 CLINICAL DATA:  Two days of shortness of breath, suspect COPD exacerbation, history of right lung malignancy, asthma -COPD, former smoker. EXAM: CHEST  2 VIEW COMPARISON:  Portable chest x-ray of December 08, 2016 FINDINGS: The lungs remain hyperinflated. There is no focal infiltrate. There is no pleural  effusion or pneumothorax. There is stable parenchymal density in the right upper lobe. The heart and pulmonary vascularity are normal. There is calcification in the wall of the aortic arch. The porta catheter tip projects over the midportion of the SVC. Old rib fractures are present bilaterally. IMPRESSION: COPD. Chronic changes in the right hilar region and right upper lobe. No acute pneumonia, pneumothorax, or pulmonary parenchymal mass. No CHF. Thoracic aortic atherosclerosis. Electronically Signed   By: David  Martinique M.D.   On: 01/30/2017 16:15   Nm  Pulmonary Perf And Vent  Result Date: 01/31/2017 CLINICAL DATA:  Positive D-dimer. EXAM: NUCLEAR MEDICINE VENTILATION - PERFUSION LUNG SCAN TECHNIQUE: Ventilation images were obtained in multiple projections using inhaled aerosol Tc-25mDTPA. Perfusion images were obtained in multiple projections after intravenous injection of Tc-981mAA. RADIOPHARMACEUTICALS:  33.1 mCi Technetium-9961mPA aerosol inhalation and 3.7 mCi Technetium-12m54m IV COMPARISON:  Chest x-ray 01/30/2017. FINDINGS: Very large matching ventilation and perfusion defects are noted bilaterally. Although changes may is be secondary COPD, this scan is indeterminate for pulmonary embolus. IMPRESSION: Severe bilateral matching defects. Although these changes may be secondary to COPD this scan is for pulmonary embolus. Electronically Signed   By: ThomMarcello Mooresgister   On: 01/31/2017 12:28   ASSESSMENT AND PLAN:  Craig [name] a 66 y66. male with a known history of Hypertension, diabetes, COPD, CKD stage III, lung cancer presents to the emergency room from the cancer center after he was noticed to have saturations the 70s on 3 status oxygen in the office. Patient saw Dr. FlemRaul Delew days back with shortness of breath and was placed on prednisone  * Acute COPD exacerbation Failed outpatient treatment -IV steroids, Antibiotics - Scheduled Nebulizers - Inhalers -Wean O2 as tolerated - Consult pulmonary if no improvement  * Acute on chronic respiratory failure due to COPD exacerbation. Nebs when necessary and scheduled. Wean oxygen as tolerated.  * [facility] Continue [facility].  * Diabetes mellitus. Add sliding scale insulin. Continue home medications. Will likely run high due to IV steroids.  * CKD stage III stable.  * DVT prophylaxis with Lovenox.  Case discussed with Care Management/Social Worker. Management plans discussed with the patient,  family and they are in agreement.  CODE STATUS: FULL   TOTAL TIME TAKING CARE OF THIS PATIENT: 25* minutes.  >50% time spent on counselling and coordination of care  POSSIBLE D/C IN 1-2 DAYS, DEPENDING ON CLINICAL CONDITION.  Note: This dictation was prepared with Dragon dictation along with smaller phrase technology. Any transcriptional errors that result from this process are unintentional.  Ailyn Gladd M.D on 01/31/2017 at 2:19 PM  Between 7am to 6pm - Pager - [phone]  After 6pm go to [email or web address] - password [email or web address]  Office  [phone]: Primary care physician; DAVIJuluis Pitch

## 2017-02-01 LAB — GLUCOSE, CAPILLARY: GLUCOSE-CAPILLARY: 101 mg/dL — AB (ref 65–99)

## 2017-02-01 LAB — HEMOGLOBIN A1C
Hgb A1c MFr Bld: 6.3 % — ABNORMAL HIGH (ref 4.8–5.6)
Mean Plasma Glucose: 134 mg/dL

## 2017-02-01 MED ORDER — PREDNISONE 10 MG PO TABS: ORAL_TABLET | ORAL | 0 refills | Status: DC

## 2017-02-01 MED ORDER — DM-GUAIFENESIN ER 30-600 MG PO TB12
1.0000 | ORAL_TABLET | Freq: Two times a day (BID) | ORAL | Status: DC
  Filled 2017-02-01: qty 1

## 2017-02-01 MED ORDER — DM-GUAIFENESIN ER 30-600 MG PO TB12: 1.0000 | ORAL_TABLET | Freq: Two times a day (BID) | ORAL | 0 refills | Status: DC

## 2017-02-01 MED ORDER — PREDNISONE 50 MG PO TABS
50.0000 mg | ORAL_TABLET | Freq: Every day | ORAL | Status: DC
  Administered 2017-02-01: 50 mg via ORAL
  Filled 2017-02-01: qty 1

## 2017-02-01 MED ORDER — HEPARIN (PORCINE) IN NACL 2-0.9 UNIT/ML-% IJ SOLN
INTRAMUSCULAR | Status: AC
  Filled 2017-02-01: qty 1000

## 2017-02-01 MED ORDER — LEVOFLOXACIN 500 MG PO TABS: 500.0000 mg | ORAL_TABLET | Freq: Every day | ORAL | 0 refills | Status: DC

## 2017-02-01 MED ORDER — LIDOCAINE HCL (PF) 1 % IJ SOLN
INTRAMUSCULAR | Status: AC
  Filled 2017-02-01: qty 30

## 2017-02-01 MED ORDER — HEPARIN SOD (PORK) LOCK FLUSH 100 UNIT/ML IV SOLN
500.0000 [IU] | Freq: Once | INTRAVENOUS | Status: DC
  Filled 2017-02-01: qty 5

## 2017-02-01 NOTE — Progress Notes (Signed)
Dc instructions given, pt verbalizes understanding of meds and f/u appt. Dc home with home O2 on and in w/c. Family at bedside

## 2017-02-01 NOTE — Discharge Summary (Signed)
[facility] at [place] NAME: Craig [name]    MR#:  [id number]  DATE OF BIRTH:  1951/01/31  DATE OF ADMISSION:  01/30/2017 ADMITTING PHYSICIAN: Hillary Bow, MD  DATE OF DISCHARGE: 02/01/17  PRIMARY CARE PHYSICIAN: [name], MD    ADMISSION DIAGNOSIS:  Positive D dimer [R79.89] COPD exacerbation ([place]) [J44.1]  DISCHARGE DIAGNOSIS:  Acute on Chronic COPD exacerbation Acute bronchitis Chronic Oxygen use  SECONDARY DIAGNOSIS:   Past Medical History:  Diagnosis Date  . Asthma   . Cancer ([place])   . Colon cancer ([place])   . COPD (chronic obstructive pulmonary disease) ([place])   . Diabetes mellitus without complication ([place])   . Difficulty sleeping   . Dysrhythmia   . GERD (gastroesophageal reflux disease)   . Glaucoma   . History of bronchitis   . Hypertension   . 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 Lymphoma (H[facility])    low grade  . Renal cancer ([place])   . Respiratory failure ([place]) 07/12/2010   acute  . Right renal mass   . Shortness of breath dyspnea   . Stroke ([place]) 1990'S   NO RESIDUAL PROBLEMS  . Supplemental oxygen dependent    USES 3 LITERS CONTINUOUSLY    HOSPITAL COURSE:  Craig [name] a 66 y.o.malewith a known history of Hypertension, diabetes, COPD, CKD stage III, lung cancer presents to the emergency room from the cancer center after he was noticed to have saturations the 70s on 3 status oxygen in the office. Patient saw Dr. Raul Del a few days back with shortness of breath and was placed on prednisone  * Acute COPD exacerbation Failed outpatient treatment -IV steroids, Antibiotics---change to oral taper and then resume daily prednisone (chronic) as before -oral abxs -prn Nebulizers and Inhalers -sats 100 % on 3liters (wears at home) - V/Q scan indeterminate for PE. Due to his severe emphysema -pt follows with Dr Raul Del  * Acute on chronic respiratory failure  due to COPD exacerbation. Nebs when necessary and scheduled.   * Lung cancer. Presently undergoing chemotherapy at [facility]  * Hypertension. Continue home medications.  * Diabetes mellitus. Add sliding scale insulin. Continue home medications. Will likely run high due to IV steroids.  * CKD stage III stable.  * DVT prophylaxis with Lovenox.  Overall nearing his baseline. D/c home D/w family  CONSULTS OBTAINED:    DRUG ALLERGIES:   Allergies  Allergen Reactions  . Iodinated Diagnostic Agents Shortness Of Breath and Nausea And Vomiting    13-hour prep  . Nexium [Esomeprazole Magnesium] Other (See Comments)    headache    DISCHARGE MEDICATIONS:   Current Discharge Medication List    START taking these medications   Details  dextromethorphan-guaiFENesin (MUCINEX DM) 30-600 MG 12hr tablet Take 1 tablet by mouth 2 (two) times daily. Qty: 16 tablet, Refills: 0    levofloxacin (LEVAQUIN) 500 MG tablet Take 1 tablet (500 mg total) by mouth daily. Qty: 6 tablet, Refills: 0    !! predniSONE (DELTASONE) 10 MG tablet Take 50 mg daily taper by 10 mg and then resume your daily 10 mg daily dose after 5 days Qty: 15 tablet, Refills: 0     !! - Potential duplicate medications found. Please discuss with provider.    CONTINUE these medications which have NOT CHANGED   Details  albuterol (PROVENTIL HFA;VENTOLIN HFA) 108 (90 BASE) MCG/ACT inhaler Inhale 2 puffs into the lungs  every 6 (six) hours as needed for wheezing or shortness of breath.     albuterol (PROVENTIL) (2.5 MG/3ML) 0.083% nebulizer solution USE 1 VIAL IN NEBULIZER EVERY 3-4 HOURS AS NEEDED Qty: 75 mL, Refills: 3    budesonide-formoterol (SYMBICORT) 160-4.5 MCG/ACT inhaler Inhale 2 puffs into the lungs 2 (two) times daily.     diphenhydrAMINE (BENADRYL) 25 MG tablet Take 2 tablets (50 mg total) by mouth as directed. Qty: 2 tablet, Refills: 0   Associated Diagnoses: Clear cell renal cell carcinoma, right  (HCC)    HYDROcodone-acetaminophen (NORCO/VICODIN) 5-325 MG tablet Take 1 tablet by mouth every 8 (eight) hours as needed for moderate pain. Please Note new directions Qty: 90 tablet, Refills: 0   Associated Diagnoses: Malignant neoplasm of left lung, unspecified part of lung ([place]); Renal cell cancer, right ([place]); Lymphoma, small lymphocytic (HCC)    loperamide (IMODIUM A-D) 2 MG tablet Take 2 mg by mouth 4 (four) times daily as needed for diarrhea or loose stools. Reported on 12/21/2015    losartan-hydrochlorothiazide (HYZAAR) 50-12.5 MG per tablet Take 1 tablet by mouth daily.    pioglitazone (ACTOS) 30 MG tablet Take 30 mg by mouth daily.    !! predniSONE (DELTASONE) 10 MG tablet TAKE 1 TABLET (10 MG TOTAL) BY MOUTH DAILY WITH BREAKFAST. Qty: 30 tablet, Refills: 3    temazepam (RESTORIL) 15 MG capsule Take 15 mg by mouth at bedtime as needed.     tiotropium (SPIRIVA) 18 MCG inhalation capsule Place 18 mcg into inhaler and inhale daily.     Acalabrutinib 100 MG CAPS Take 100 mg by mouth every 12 (twelve) hours. Qty: 60 capsule, Refills: 4   Associated Diagnoses: Lymphoma, small lymphocytic ([place])     !! - Potential duplicate medications found. Please discuss with provider.      If you experience worsening of your admission symptoms, develop shortness of breath, life threatening emergency, suicidal or homicidal thoughts you must seek medical attention immediately by calling 911 or calling your MD immediately  if symptoms less severe.  You Must read complete instructions/literature along with all the possible adverse reactions/side effects for all the Medicines you take and that have been prescribed to you. Take any new Medicines after you have completely understood and accept all the possible adverse reactions/side effects.   Please note  You were cared for by a hospitalist during your hospital stay. If you have any questions about your discharge medications or the care you received  while you were in the hospital after you are discharged, you can call the unit and asked to speak with the hospitalist on call if the hospitalist that took care of you is not available. Once you are discharged, your primary care physician will handle any further medical issues. Please note that NO REFILLS for any discharge medications will be authorized once you are discharged, as it is imperative that you return to your primary care physician (or establish a relationship with a primary care physician if you do not have one) for your aftercare needs so that they can reassess your need for medications and monitor your lab values. Today   SUBJECTIVE   Breathing improving  VITAL SIGNS:  Blood pressure (!) 123/48, pulse 98, temperature 97.8 F (36.6 C), temperature source Oral, resp. rate 18, height '5\' 8"'$  (1.727 m), weight 60.2 kg (132 lb 11.2 oz), SpO2 100 %.  I/O:    Intake/Output Summary (Last 24 hours) at 02/01/17 0901 Last data filed at 02/01/17 0506  Gross  per 24 hour  Intake                0 ml  Output             1200 ml  Net            -1200 ml    PHYSICAL EXAMINATION:  GENERAL:  66 y.o.-year-old patient lying in the bed with no acute distress.  EYES: Pupils equal, round, reactive to light and accommodation. No scleral icterus. Extraocular muscles intact.  HEENT: Head atraumatic, normocephalic. Oropharynx and nasopharynx clear.  NECK:  Supple, no jugular venous distention. No thyroid enlargement, no tenderness.  LUNGS: distant breath sounds bilaterally, no wheezing, rales,rhonchi or crepitation. No use of accessory muscles of respiration.  CARDIOVASCULAR: S1, S2 normal. No murmurs, rubs, or gallops.  ABDOMEN: Soft, non-tender, non-distended. Bowel sounds present. No organomegaly or mass.  EXTREMITIES: No pedal edema, cyanosis, or clubbing.  NEUROLOGIC: Cranial nerves II through XII are intact. Muscle strength 5/5 in all extremities. Sensation intact. Gait not checked.   PSYCHIATRIC: The patient is alert and oriented x 3.  SKIN: No obvious rash, lesion, or ulcer.   DATA REVIEW:   CBC   Recent Labs Lab 01/31/17 0508  WBC 4.4  HGB 8.7*  HCT 26.7*  PLT 180    Chemistries   Recent Labs Lab 01/30/17 1405  01/31/17 0508  NA 139  < > 142  K 4.8  < > 4.8  CL 96*  < > 97*  CO2 36*  < > 40*  GLUCOSE 186*  < > 174*  BUN 33*  < > 31*  CREATININE 1.33*  < > 1.17  CALCIUM 8.5*  < > 8.3*  AST 17  --   --   ALT 15*  --   --   ALKPHOS 76  --   --   BILITOT 0.4  --   --   < > = values in this interval not displayed.  Microbiology Results   No results found for this or any previous visit (from the past 240 hour(s)).  RADIOLOGY:  Dg Chest 2 View  Result Date: 01/30/2017 CLINICAL DATA:  Two days of shortness of breath, suspect COPD exacerbation, history of right lung malignancy, asthma -COPD, former smoker. EXAM: CHEST  2 VIEW COMPARISON:  Portable chest x-ray of December 08, 2016 FINDINGS: The lungs remain hyperinflated. There is no focal infiltrate. There is no pleural effusion or pneumothorax. There is stable parenchymal density in the right upper lobe. The heart and pulmonary vascularity are normal. There is calcification in the wall of the aortic arch. The porta catheter tip projects over the midportion of the SVC. Old rib fractures are present bilaterally. IMPRESSION: COPD. Chronic changes in the right hilar region and right upper lobe. No acute pneumonia, pneumothorax, or pulmonary parenchymal mass. No CHF. Thoracic aortic atherosclerosis. Electronically Signed   By: David  Martinique M.D.   On: 01/30/2017 16:15   Nm Pulmonary Perf And Vent  Result Date: 01/31/2017 CLINICAL DATA:  Positive D-dimer. EXAM: NUCLEAR MEDICINE VENTILATION - PERFUSION LUNG SCAN TECHNIQUE: Ventilation images were obtained in multiple projections using inhaled aerosol Tc-70mDTPA. Perfusion images were obtained in multiple projections after intravenous injection of Tc-93mAA.  RADIOPHARMACEUTICALS:  33.1 mCi Technetium-99101mPA aerosol inhalation and 3.7 mCi Technetium-21m74m IV COMPARISON:  Chest x-ray 01/30/2017. FINDINGS: Very large matching ventilation and perfusion defects are noted bilaterally. Although changes may is be secondary COPD, this scan is indeterminate for pulmonary embolus.  IMPRESSION: Severe bilateral matching defects. Although these changes may be secondary to COPD this scan is for pulmonary embolus. Electronically Signed   By: Marcello Moores  Register   On: 01/31/2017 12:28     Management plans discussed with the patient, family and they are in agreement.  CODE STATUS:     Code Status Orders        Start     Ordered   01/30/17 1818  Full code  Continuous     01/30/17 1819    Code Status History    Date Active Date Inactive Code Status Order ID Comments User Context   12/07/2016  4:43 PM 12/10/2016  8:50 PM Full Code [id number]  Bettey Costa, MD Inpatient   07/02/2016  3:01 PM 07/03/2016  3:55 PM DNR [id number]  Hillary Bow, MD ED   05/31/2016  6:48 PM 06/02/2016  3:41 PM DNR [id number]  Nicholes Mango, MD Inpatient   11/19/2015  7:53 PM 11/20/2015  2:12 PM Full Code [id number]  Idelle Crouch, MD Inpatient      TOTAL TIME TAKING CARE OF THIS PATIENT: 40 minutes.    Tawanna Funk M.D on 02/01/2017 at 9:01 AM  Between 7am to 6pm - Pager - [phone] After 6pm go to [email or web address] - password [email or web address] [facility]  Office  [phone]  CC: Primary care physician; [name], MD

## 2017-02-05 ENCOUNTER — Other Ambulatory Visit: Payer: Self-pay | Admitting: *Deleted

## 2017-02-05 ENCOUNTER — Encounter: Payer: Self-pay | Admitting: *Deleted

## 2017-02-05 DIAGNOSIS — C3410 Malignant neoplasm of upper lobe, unspecified bronchus or lung: Secondary | ICD-10-CM | POA: Diagnosis not present

## 2017-02-05 DIAGNOSIS — E119 Type 2 diabetes mellitus without complications: Secondary | ICD-10-CM | POA: Diagnosis not present

## 2017-02-05 DIAGNOSIS — D649 Anemia, unspecified: Secondary | ICD-10-CM | POA: Diagnosis not present

## 2017-02-05 DIAGNOSIS — H409 Unspecified glaucoma: Secondary | ICD-10-CM | POA: Diagnosis not present

## 2017-02-05 DIAGNOSIS — C649 Malignant neoplasm of unspecified kidney, except renal pelvis: Secondary | ICD-10-CM | POA: Diagnosis not present

## 2017-02-05 DIAGNOSIS — J441 Chronic obstructive pulmonary disease with (acute) exacerbation: Secondary | ICD-10-CM | POA: Diagnosis not present

## 2017-02-05 DIAGNOSIS — I10 Essential (primary) hypertension: Secondary | ICD-10-CM | POA: Diagnosis not present

## 2017-02-05 DIAGNOSIS — C8300 Small cell B-cell lymphoma, unspecified site: Secondary | ICD-10-CM | POA: Diagnosis not present

## 2017-02-05 DIAGNOSIS — M6281 Muscle weakness (generalized): Secondary | ICD-10-CM | POA: Diagnosis not present

## 2017-02-05 NOTE — Patient Outreach (Signed)
[name]) Care Management  [place]  02/05/2017   Craig [name]. 1951-05-07 [id number]   Referral via [employer] Transition of Care list; recent discharge from [facility] 02/01/2017.   Subjective: Telephone call to patient who was advised of reason for call & [facility] care management services. HIPPA verification received. Advised that he was admitted because of COPD flair up. Currently has support of friend. Has all of medications &has transportation when needed. States using oxygen 24/7.    Objective: per medical record patient admitted to [facility] 4/18-4/20/2018. Dx COPD exacerbation.   Hx COPD, CKD 3, HTN, DM, Lung cancer(undergoing chemotherapy), chronic O2 use. Encounter Medications:  Outpatient Encounter Prescriptions as of 02/05/2017  Medication Sig Note  . albuterol (PROVENTIL HFA;VENTOLIN HFA) 108 (90 BASE) MCG/ACT inhaler Inhale 2 puffs into the lungs every 6 (six) hours as needed for wheezing or shortness of breath.    Marland Kitchen albuterol (PROVENTIL) (2.5 MG/3ML) 0.083% nebulizer solution USE 1 VIAL IN NEBULIZER EVERY 3-4 HOURS AS NEEDED   . budesonide-formoterol (SYMBICORT) 160-4.5 MCG/ACT inhaler Inhale 2 puffs into the lungs 2 (two) times daily.    . diphenhydrAMINE (BENADRYL) 25 MG tablet Take 2 tablets (50 mg total) by mouth as directed. (Patient taking differently: Take 25 mg by mouth as needed for itching or allergies. ) 07/20/2016: As needed.   Marland Kitchen HYDROcodone-acetaminophen (NORCO/VICODIN) 5-325 MG tablet Take 1 tablet by mouth every 8 (eight) hours as needed for moderate pain. Please Note new directions   . loperamide (IMODIUM A-D) 2 MG tablet Take 2 mg by mouth 4 (four) times daily as needed for diarrhea or loose stools. Reported on 12/21/2015 07/20/2016: As needed.   Marland Kitchen losartan-hydrochlorothiazide (HYZAAR) 50-12.5 MG per tablet Take 1 tablet by mouth daily.   . pioglitazone (ACTOS) 30 MG tablet Take 30 mg by mouth daily.   .  predniSONE (DELTASONE) 10 MG tablet Take 50 mg daily taper by 10 mg and then resume your daily 10 mg daily dose after 5 days   . temazepam (RESTORIL) 15 MG capsule Take 15 mg by mouth at bedtime as needed.    . tiotropium (SPIRIVA) 18 MCG inhalation capsule Place 18 mcg into inhaler and inhale daily.    . Acalabrutinib 100 MG CAPS Take 100 mg by mouth every 12 (twelve) hours. (Patient not taking: Reported on 01/30/2017) 01/30/2017: Pt states he was taking this before christmas but has stopped   . dextromethorphan-guaiFENesin (MUCINEX DM) 30-600 MG 12hr tablet Take 1 tablet by mouth 2 (two) times daily. (Patient not taking: Reported on 02/05/2017) 02/05/2017: Patient states should come in mail this week via mail order/not taking yet  . levofloxacin (LEVAQUIN) 500 MG tablet Take 1 tablet (500 mg total) by mouth daily. (Patient not taking: Reported on 02/05/2017) 02/05/2017: Has completed antibiotic dosage today.  . predniSONE (DELTASONE) 10 MG tablet TAKE 1 TABLET (10 MG TOTAL) BY MOUTH DAILY WITH BREAKFAST. (Patient not taking: Reported on 02/05/2017) 02/05/2017: Will restart tis dosage when tapering dosage is completed,   Facility-Administered Encounter Medications as of 02/05/2017  Medication  . sodium chloride 0.9 % injection 10 mL  . sodium chloride 0.9 % injection 10 mL    Functional Status:  In your present state of health, do you have any difficulty performing the following activities: 01/30/2017 12/07/2016  Hearing? N N  Vision? N -  Difficulty concentrating or making decisions? N -  Walking or climbing stairs? Y -  Dressing or bathing? N -  Doing errands, shopping? N N  Preparing Food and eating ? - -  Using the Toilet? - -  Do you have problems with loss of bowel control? - -  Managing your Medications? - -  Managing your Finances? - -  Housekeeping or managing your Housekeeping? - -  Some recent data might be hidden    Fall/Depression Screening: Fall Risk  02/05/2017 12/04/2016 07/20/2016   Falls in the past year? No No No   PHQ 2/9 Scores 02/05/2017 07/20/2016  PHQ - 2 Score 0 0    Assessment: Appropriate for Transition of Care Program.  Patient consents to [facility] services.    THN CM Care Plan Problem One     Most Recent Value  Care Plan Problem One  At risk for readmission to hospiital due to recent admission for COPD exacerbation  Role Documenting the Problem One  Care Management Telephonic Coordinator  Care Plan for Problem One  Active  THN Long Term Goal (31-90 days)  No readmission within 2 weeks of previous admission  [facility] Long Term Goal Start Date  02/05/17  Interventions for Problem One Long Term Goal  advise of importance of medication compliance, attending MD appts., knowing COPD  action plan  THN CM Short Term Goal #1 (0-30 days)  Pt will make appointment for & attend hosp follow up appt.within 14 days  THN CM Short Term Goal #1 Start Date  02/05/17  Interventions for Short Term Goal #1  Advise of importance of hosp f/u appt  THN CM Short Term Goal #2 (0-30 days)  Patient will report that he taking medications as prescribed within 14 daysi  THN CM Short Term Goal #2 Start Date  02/05/17  Interventions for Short Term Goal #2  Review meds with patient,  advise of importance of medication compliance       Plan:  Develop care plan. Follow up calls to patient.   Comptroller to patient. Complete health assessments. Send involvement letter to MD.   Sherrin Daisy, RN BSN [place] Management Coordinator [facility] Care Management  [phone]

## 2017-02-06 ENCOUNTER — Encounter: Payer: Self-pay | Admitting: *Deleted

## 2017-02-07 ENCOUNTER — Encounter: Payer: Self-pay | Admitting: *Deleted

## 2017-02-12 ENCOUNTER — Other Ambulatory Visit (HOSPITAL_COMMUNITY): Payer: Self-pay | Admitting: Interventional Radiology

## 2017-02-12 DIAGNOSIS — M6281 Muscle weakness (generalized): Secondary | ICD-10-CM | POA: Diagnosis not present

## 2017-02-12 DIAGNOSIS — H409 Unspecified glaucoma: Secondary | ICD-10-CM | POA: Diagnosis not present

## 2017-02-12 DIAGNOSIS — J441 Chronic obstructive pulmonary disease with (acute) exacerbation: Secondary | ICD-10-CM | POA: Diagnosis not present

## 2017-02-12 DIAGNOSIS — C8300 Small cell B-cell lymphoma, unspecified site: Secondary | ICD-10-CM | POA: Diagnosis not present

## 2017-02-12 DIAGNOSIS — D649 Anemia, unspecified: Secondary | ICD-10-CM | POA: Diagnosis not present

## 2017-02-12 DIAGNOSIS — C649 Malignant neoplasm of unspecified kidney, except renal pelvis: Secondary | ICD-10-CM | POA: Diagnosis not present

## 2017-02-12 DIAGNOSIS — C641 Malignant neoplasm of right kidney, except renal pelvis: Secondary | ICD-10-CM

## 2017-02-12 DIAGNOSIS — C3410 Malignant neoplasm of upper lobe, unspecified bronchus or lung: Secondary | ICD-10-CM | POA: Diagnosis not present

## 2017-02-12 DIAGNOSIS — E119 Type 2 diabetes mellitus without complications: Secondary | ICD-10-CM | POA: Diagnosis not present

## 2017-02-12 DIAGNOSIS — I10 Essential (primary) hypertension: Secondary | ICD-10-CM | POA: Diagnosis not present

## 2017-02-14 ENCOUNTER — Other Ambulatory Visit: Payer: Self-pay | Admitting: *Deleted

## 2017-02-14 ENCOUNTER — Encounter: Payer: Self-pay | Admitting: *Deleted

## 2017-02-14 NOTE — Patient Outreach (Signed)
[facility]) Care Management  02/14/2017  Craig [name]. [birth year] [id number]  Transition of care follow up call to patient. HIPPA verification received.  Patient voices that he has not had COPD flair up since discharge to home. Has not been re-admitted to hospital or had emergency room visit since previous discharge.   Patient reports that he went to primary care office yesterday but was advised that he did not have appointment. States he was advised to come back tomorrow for follow up. Advised patient to please call office in AM to verify appointment before he attended appointment. Patient advises that he will call first. (discharge instructions do note that Primary care appointment was 02/13/17).  Patient voices that he is taking medications consistently as prescribed.  Patient was able to voice COPD action plan.  Care plan updated as noted. Will follow up next. Complete vision & hearing health assessments.  Sherrin Daisy, RN BSN [place] Management Coordinator [facility] Care Management  [phone]

## 2017-02-15 DIAGNOSIS — H409 Unspecified glaucoma: Secondary | ICD-10-CM | POA: Diagnosis not present

## 2017-02-15 DIAGNOSIS — M6281 Muscle weakness (generalized): Secondary | ICD-10-CM | POA: Diagnosis not present

## 2017-02-15 DIAGNOSIS — C3410 Malignant neoplasm of upper lobe, unspecified bronchus or lung: Secondary | ICD-10-CM | POA: Diagnosis not present

## 2017-02-15 DIAGNOSIS — C8300 Small cell B-cell lymphoma, unspecified site: Secondary | ICD-10-CM | POA: Diagnosis not present

## 2017-02-15 DIAGNOSIS — C649 Malignant neoplasm of unspecified kidney, except renal pelvis: Secondary | ICD-10-CM | POA: Diagnosis not present

## 2017-02-15 DIAGNOSIS — E119 Type 2 diabetes mellitus without complications: Secondary | ICD-10-CM | POA: Diagnosis not present

## 2017-02-15 DIAGNOSIS — I10 Essential (primary) hypertension: Secondary | ICD-10-CM | POA: Diagnosis not present

## 2017-02-15 DIAGNOSIS — J441 Chronic obstructive pulmonary disease with (acute) exacerbation: Secondary | ICD-10-CM | POA: Diagnosis not present

## 2017-02-15 DIAGNOSIS — D649 Anemia, unspecified: Secondary | ICD-10-CM | POA: Diagnosis not present

## 2017-02-16 DIAGNOSIS — M25532 Pain in left wrist: Secondary | ICD-10-CM | POA: Diagnosis not present

## 2017-02-16 DIAGNOSIS — C649 Malignant neoplasm of unspecified kidney, except renal pelvis: Secondary | ICD-10-CM | POA: Diagnosis not present

## 2017-02-16 DIAGNOSIS — C3410 Malignant neoplasm of upper lobe, unspecified bronchus or lung: Secondary | ICD-10-CM | POA: Diagnosis not present

## 2017-02-16 DIAGNOSIS — M6281 Muscle weakness (generalized): Secondary | ICD-10-CM | POA: Diagnosis not present

## 2017-02-18 DIAGNOSIS — C8300 Small cell B-cell lymphoma, unspecified site: Secondary | ICD-10-CM | POA: Diagnosis not present

## 2017-02-18 DIAGNOSIS — E1122 Type 2 diabetes mellitus with diabetic chronic kidney disease: Secondary | ICD-10-CM | POA: Diagnosis not present

## 2017-02-18 DIAGNOSIS — C649 Malignant neoplasm of unspecified kidney, except renal pelvis: Secondary | ICD-10-CM | POA: Diagnosis not present

## 2017-02-18 DIAGNOSIS — M6281 Muscle weakness (generalized): Secondary | ICD-10-CM | POA: Diagnosis not present

## 2017-02-18 DIAGNOSIS — J449 Chronic obstructive pulmonary disease, unspecified: Secondary | ICD-10-CM | POA: Diagnosis not present

## 2017-02-18 DIAGNOSIS — C3410 Malignant neoplasm of upper lobe, unspecified bronchus or lung: Secondary | ICD-10-CM | POA: Diagnosis not present

## 2017-02-18 DIAGNOSIS — M25532 Pain in left wrist: Secondary | ICD-10-CM | POA: Diagnosis not present

## 2017-02-18 DIAGNOSIS — N189 Chronic kidney disease, unspecified: Secondary | ICD-10-CM | POA: Diagnosis not present

## 2017-02-18 DIAGNOSIS — I129 Hypertensive chronic kidney disease with stage 1 through stage 4 chronic kidney disease, or unspecified chronic kidney disease: Secondary | ICD-10-CM | POA: Diagnosis not present

## 2017-02-19 DIAGNOSIS — E1122 Type 2 diabetes mellitus with diabetic chronic kidney disease: Secondary | ICD-10-CM | POA: Diagnosis not present

## 2017-02-19 DIAGNOSIS — C3410 Malignant neoplasm of upper lobe, unspecified bronchus or lung: Secondary | ICD-10-CM | POA: Diagnosis not present

## 2017-02-19 DIAGNOSIS — N189 Chronic kidney disease, unspecified: Secondary | ICD-10-CM | POA: Diagnosis not present

## 2017-02-19 DIAGNOSIS — M25532 Pain in left wrist: Secondary | ICD-10-CM | POA: Diagnosis not present

## 2017-02-19 DIAGNOSIS — M6281 Muscle weakness (generalized): Secondary | ICD-10-CM | POA: Diagnosis not present

## 2017-02-19 DIAGNOSIS — C8300 Small cell B-cell lymphoma, unspecified site: Secondary | ICD-10-CM | POA: Diagnosis not present

## 2017-02-19 DIAGNOSIS — J449 Chronic obstructive pulmonary disease, unspecified: Secondary | ICD-10-CM | POA: Diagnosis not present

## 2017-02-19 DIAGNOSIS — C649 Malignant neoplasm of unspecified kidney, except renal pelvis: Secondary | ICD-10-CM | POA: Diagnosis not present

## 2017-02-19 DIAGNOSIS — I129 Hypertensive chronic kidney disease with stage 1 through stage 4 chronic kidney disease, or unspecified chronic kidney disease: Secondary | ICD-10-CM | POA: Diagnosis not present

## 2017-02-20 DIAGNOSIS — E1122 Type 2 diabetes mellitus with diabetic chronic kidney disease: Secondary | ICD-10-CM | POA: Diagnosis not present

## 2017-02-20 DIAGNOSIS — N189 Chronic kidney disease, unspecified: Secondary | ICD-10-CM | POA: Diagnosis not present

## 2017-02-20 DIAGNOSIS — M6281 Muscle weakness (generalized): Secondary | ICD-10-CM | POA: Diagnosis not present

## 2017-02-20 DIAGNOSIS — M25532 Pain in left wrist: Secondary | ICD-10-CM | POA: Diagnosis not present

## 2017-02-20 DIAGNOSIS — C3410 Malignant neoplasm of upper lobe, unspecified bronchus or lung: Secondary | ICD-10-CM | POA: Diagnosis not present

## 2017-02-20 DIAGNOSIS — C8300 Small cell B-cell lymphoma, unspecified site: Secondary | ICD-10-CM | POA: Diagnosis not present

## 2017-02-20 DIAGNOSIS — I129 Hypertensive chronic kidney disease with stage 1 through stage 4 chronic kidney disease, or unspecified chronic kidney disease: Secondary | ICD-10-CM | POA: Diagnosis not present

## 2017-02-20 DIAGNOSIS — C649 Malignant neoplasm of unspecified kidney, except renal pelvis: Secondary | ICD-10-CM | POA: Diagnosis not present

## 2017-02-20 DIAGNOSIS — J449 Chronic obstructive pulmonary disease, unspecified: Secondary | ICD-10-CM | POA: Diagnosis not present

## 2017-02-21 ENCOUNTER — Other Ambulatory Visit: Payer: Self-pay | Admitting: *Deleted

## 2017-02-21 DIAGNOSIS — C3410 Malignant neoplasm of upper lobe, unspecified bronchus or lung: Secondary | ICD-10-CM | POA: Diagnosis not present

## 2017-02-21 DIAGNOSIS — J449 Chronic obstructive pulmonary disease, unspecified: Secondary | ICD-10-CM | POA: Diagnosis not present

## 2017-02-21 DIAGNOSIS — I129 Hypertensive chronic kidney disease with stage 1 through stage 4 chronic kidney disease, or unspecified chronic kidney disease: Secondary | ICD-10-CM | POA: Diagnosis not present

## 2017-02-21 DIAGNOSIS — N189 Chronic kidney disease, unspecified: Secondary | ICD-10-CM | POA: Diagnosis not present

## 2017-02-21 DIAGNOSIS — M6281 Muscle weakness (generalized): Secondary | ICD-10-CM | POA: Diagnosis not present

## 2017-02-21 DIAGNOSIS — M25532 Pain in left wrist: Secondary | ICD-10-CM | POA: Diagnosis not present

## 2017-02-21 DIAGNOSIS — C8300 Small cell B-cell lymphoma, unspecified site: Secondary | ICD-10-CM | POA: Diagnosis not present

## 2017-02-21 DIAGNOSIS — C649 Malignant neoplasm of unspecified kidney, except renal pelvis: Secondary | ICD-10-CM | POA: Diagnosis not present

## 2017-02-21 DIAGNOSIS — E1122 Type 2 diabetes mellitus with diabetic chronic kidney disease: Secondary | ICD-10-CM | POA: Diagnosis not present

## 2017-02-21 NOTE — Patient Outreach (Signed)
[place] [facility]) Care Management  02/21/2017  Craig [name]. 07/12/1951 [id number]  Transition of care follow up call attempt. Patient voices that he is not available to take call.. Request call back.  Plan: will call back at agreed upon time with patient.  Sherrin Daisy, RN BSN [place] Management Coordinator [facility] Care Management  [phone]

## 2017-02-22 ENCOUNTER — Other Ambulatory Visit: Payer: Self-pay | Admitting: *Deleted

## 2017-02-22 ENCOUNTER — Encounter: Payer: Self-pay | Admitting: *Deleted

## 2017-02-22 NOTE — Patient Outreach (Signed)
[place]) Care Management  02/22/2017  Craig [name]. 1951/02/19 [id number]  Follow up transition of care call; HIPPA verification received from patient. Patient states no hospital readmission or emergency room visits since discharge on 02/01/2017. States COPD under control; uses home oxygen. Patient familiar with COPD action plan. Knows emergency medicine & treatment to use as needed. Knows when to call 911. Patient states hospital  follow up appointment scheduled for 5/14 which is 3 weeks post hospital discharge. States he has transportation to appointment. Voices that he is taking medications as prescribed. Has support from friend.   Transition of care calls completed. Care plan updated.  Plan: Send MD closure letter. Send to care management assistant to close case.  Sherrin Daisy, RN BSN [place] Management Coordinator [facility] Care Management  [phone]

## 2017-02-25 ENCOUNTER — Encounter: Payer: Self-pay | Admitting: *Deleted

## 2017-02-25 ENCOUNTER — Other Ambulatory Visit: Payer: Self-pay | Admitting: *Deleted

## 2017-02-25 DIAGNOSIS — C3411 Malignant neoplasm of upper lobe, right bronchus or lung: Secondary | ICD-10-CM

## 2017-02-26 DIAGNOSIS — M25532 Pain in left wrist: Secondary | ICD-10-CM | POA: Diagnosis not present

## 2017-02-26 DIAGNOSIS — N189 Chronic kidney disease, unspecified: Secondary | ICD-10-CM | POA: Diagnosis not present

## 2017-02-26 DIAGNOSIS — E1122 Type 2 diabetes mellitus with diabetic chronic kidney disease: Secondary | ICD-10-CM | POA: Diagnosis not present

## 2017-02-26 DIAGNOSIS — I129 Hypertensive chronic kidney disease with stage 1 through stage 4 chronic kidney disease, or unspecified chronic kidney disease: Secondary | ICD-10-CM | POA: Diagnosis not present

## 2017-02-26 DIAGNOSIS — C8300 Small cell B-cell lymphoma, unspecified site: Secondary | ICD-10-CM | POA: Diagnosis not present

## 2017-02-26 DIAGNOSIS — M6281 Muscle weakness (generalized): Secondary | ICD-10-CM | POA: Diagnosis not present

## 2017-02-26 DIAGNOSIS — C649 Malignant neoplasm of unspecified kidney, except renal pelvis: Secondary | ICD-10-CM | POA: Diagnosis not present

## 2017-02-26 DIAGNOSIS — J449 Chronic obstructive pulmonary disease, unspecified: Secondary | ICD-10-CM | POA: Diagnosis not present

## 2017-02-26 DIAGNOSIS — C3410 Malignant neoplasm of upper lobe, unspecified bronchus or lung: Secondary | ICD-10-CM | POA: Diagnosis not present

## 2017-02-27 DIAGNOSIS — E1122 Type 2 diabetes mellitus with diabetic chronic kidney disease: Secondary | ICD-10-CM | POA: Diagnosis not present

## 2017-02-27 DIAGNOSIS — I129 Hypertensive chronic kidney disease with stage 1 through stage 4 chronic kidney disease, or unspecified chronic kidney disease: Secondary | ICD-10-CM | POA: Diagnosis not present

## 2017-02-27 DIAGNOSIS — C8300 Small cell B-cell lymphoma, unspecified site: Secondary | ICD-10-CM | POA: Diagnosis not present

## 2017-02-27 DIAGNOSIS — M25532 Pain in left wrist: Secondary | ICD-10-CM | POA: Diagnosis not present

## 2017-02-27 DIAGNOSIS — M6281 Muscle weakness (generalized): Secondary | ICD-10-CM | POA: Diagnosis not present

## 2017-02-27 DIAGNOSIS — J449 Chronic obstructive pulmonary disease, unspecified: Secondary | ICD-10-CM | POA: Diagnosis not present

## 2017-02-27 DIAGNOSIS — N189 Chronic kidney disease, unspecified: Secondary | ICD-10-CM | POA: Diagnosis not present

## 2017-02-27 DIAGNOSIS — C649 Malignant neoplasm of unspecified kidney, except renal pelvis: Secondary | ICD-10-CM | POA: Diagnosis not present

## 2017-02-27 DIAGNOSIS — C3410 Malignant neoplasm of upper lobe, unspecified bronchus or lung: Secondary | ICD-10-CM | POA: Diagnosis not present

## 2017-02-28 ENCOUNTER — Emergency Department: Payer: Medicare HMO

## 2017-02-28 ENCOUNTER — Inpatient Hospital Stay
Admission: EM | Admit: 2017-02-28 | Discharge: 2017-03-02 | DRG: 190 | Disposition: A | Discharge status: Home / Self Care | Payer: Medicare HMO | Attending: Internal Medicine | Admitting: Internal Medicine

## 2017-02-28 ENCOUNTER — Inpatient Hospital Stay: Payer: Medicare HMO

## 2017-02-28 ENCOUNTER — Inpatient Hospital Stay: Payer: Medicare HMO | Admitting: Internal Medicine

## 2017-02-28 ENCOUNTER — Encounter: Payer: Self-pay | Admitting: Emergency Medicine

## 2017-02-28 DIAGNOSIS — D631 Anemia in chronic kidney disease: Secondary | ICD-10-CM | POA: Diagnosis not present

## 2017-02-28 DIAGNOSIS — C3410 Malignant neoplasm of upper lobe, unspecified bronchus or lung: Secondary | ICD-10-CM | POA: Diagnosis not present

## 2017-02-28 DIAGNOSIS — C649 Malignant neoplasm of unspecified kidney, except renal pelvis: Secondary | ICD-10-CM | POA: Diagnosis not present

## 2017-02-28 DIAGNOSIS — I129 Hypertensive chronic kidney disease with stage 1 through stage 4 chronic kidney disease, or unspecified chronic kidney disease: Secondary | ICD-10-CM | POA: Diagnosis present

## 2017-02-28 DIAGNOSIS — Z888 Allergy status to other drugs, medicaments and biological substances status: Secondary | ICD-10-CM

## 2017-02-28 DIAGNOSIS — N183 Chronic kidney disease, stage 3 (moderate): Secondary | ICD-10-CM | POA: Diagnosis not present

## 2017-02-28 DIAGNOSIS — Z91041 Radiographic dye allergy status: Secondary | ICD-10-CM | POA: Diagnosis not present

## 2017-02-28 DIAGNOSIS — J9601 Acute respiratory failure with hypoxia: Secondary | ICD-10-CM

## 2017-02-28 DIAGNOSIS — R0602 Shortness of breath: Secondary | ICD-10-CM | POA: Diagnosis not present

## 2017-02-28 DIAGNOSIS — J9621 Acute and chronic respiratory failure with hypoxia: Secondary | ICD-10-CM | POA: Diagnosis present

## 2017-02-28 DIAGNOSIS — Z85038 Personal history of other malignant neoplasm of large intestine: Secondary | ICD-10-CM

## 2017-02-28 DIAGNOSIS — Z9981 Dependence on supplemental oxygen: Secondary | ICD-10-CM

## 2017-02-28 DIAGNOSIS — H409 Unspecified glaucoma: Secondary | ICD-10-CM | POA: Diagnosis present

## 2017-02-28 DIAGNOSIS — Z7984 Long term (current) use of oral hypoglycemic drugs: Secondary | ICD-10-CM | POA: Diagnosis not present

## 2017-02-28 DIAGNOSIS — J9620 Acute and chronic respiratory failure, unspecified whether with hypoxia or hypercapnia: Secondary | ICD-10-CM | POA: Diagnosis not present

## 2017-02-28 DIAGNOSIS — Z79899 Other long term (current) drug therapy: Secondary | ICD-10-CM

## 2017-02-28 DIAGNOSIS — Z833 Family history of diabetes mellitus: Secondary | ICD-10-CM | POA: Diagnosis not present

## 2017-02-28 DIAGNOSIS — Z7951 Long term (current) use of inhaled steroids: Secondary | ICD-10-CM

## 2017-02-28 DIAGNOSIS — K219 Gastro-esophageal reflux disease without esophagitis: Secondary | ICD-10-CM | POA: Diagnosis present

## 2017-02-28 DIAGNOSIS — Z85118 Personal history of other malignant neoplasm of bronchus and lung: Secondary | ICD-10-CM | POA: Diagnosis not present

## 2017-02-28 DIAGNOSIS — N189 Chronic kidney disease, unspecified: Secondary | ICD-10-CM | POA: Diagnosis not present

## 2017-02-28 DIAGNOSIS — Z85528 Personal history of other malignant neoplasm of kidney: Secondary | ICD-10-CM | POA: Diagnosis not present

## 2017-02-28 DIAGNOSIS — Z8673 Personal history of transient ischemic attack (TIA), and cerebral infarction without residual deficits: Secondary | ICD-10-CM

## 2017-02-28 DIAGNOSIS — J441 Chronic obstructive pulmonary disease with (acute) exacerbation: Principal | ICD-10-CM | POA: Diagnosis present

## 2017-02-28 DIAGNOSIS — E119 Type 2 diabetes mellitus without complications: Secondary | ICD-10-CM | POA: Diagnosis not present

## 2017-02-28 DIAGNOSIS — J449 Chronic obstructive pulmonary disease, unspecified: Secondary | ICD-10-CM | POA: Diagnosis not present

## 2017-02-28 DIAGNOSIS — M25532 Pain in left wrist: Secondary | ICD-10-CM | POA: Diagnosis not present

## 2017-02-28 DIAGNOSIS — M6281 Muscle weakness (generalized): Secondary | ICD-10-CM | POA: Diagnosis not present

## 2017-02-28 DIAGNOSIS — Z87891 Personal history of nicotine dependence: Secondary | ICD-10-CM | POA: Diagnosis not present

## 2017-02-28 DIAGNOSIS — Z8572 Personal history of non-Hodgkin lymphomas: Secondary | ICD-10-CM

## 2017-02-28 DIAGNOSIS — E1122 Type 2 diabetes mellitus with diabetic chronic kidney disease: Secondary | ICD-10-CM | POA: Diagnosis not present

## 2017-02-28 DIAGNOSIS — C641 Malignant neoplasm of right kidney, except renal pelvis: Secondary | ICD-10-CM

## 2017-02-28 DIAGNOSIS — C8300 Small cell B-cell lymphoma, unspecified site: Secondary | ICD-10-CM | POA: Diagnosis not present

## 2017-02-28 DIAGNOSIS — Z7952 Long term (current) use of systemic steroids: Secondary | ICD-10-CM

## 2017-02-28 DIAGNOSIS — E43 Unspecified severe protein-calorie malnutrition: Secondary | ICD-10-CM | POA: Diagnosis present

## 2017-02-28 DIAGNOSIS — Z8249 Family history of ischemic heart disease and other diseases of the circulatory system: Secondary | ICD-10-CM | POA: Diagnosis not present

## 2017-02-28 DIAGNOSIS — R000 Tachycardia, unspecified: Secondary | ICD-10-CM | POA: Diagnosis present

## 2017-02-28 LAB — BASIC METABOLIC PANEL
ANION GAP: 7 (ref 5–15)
BUN: 18 mg/dL (ref 6–20)
CALCIUM: 8.7 mg/dL — AB (ref 8.9–10.3)
CO2: 40 mmol/L — ABNORMAL HIGH (ref 22–32)
CREATININE: 1.2 mg/dL (ref 0.61–1.24)
Chloride: 94 mmol/L — ABNORMAL LOW (ref 101–111)
GFR calc Af Amer: >60 mL/min (ref >60)
Glucose, Bld: 117 mg/dL — ABNORMAL HIGH (ref 65–99)
POTASSIUM: 4.6 mmol/L (ref 3.5–5.1)
Sodium: 141 mmol/L (ref 135–145)

## 2017-02-28 LAB — GLUCOSE, CAPILLARY
GLUCOSE-CAPILLARY: 177 mg/dL — AB (ref 65–99)
Glucose-Capillary: 106 mg/dL — ABNORMAL HIGH (ref 65–99)
Glucose-Capillary: 215 mg/dL — ABNORMAL HIGH (ref 65–99)

## 2017-02-28 LAB — CBC
HCT: 29.9 % — ABNORMAL LOW (ref 40.0–52.0)
Hemoglobin: 9.7 g/dL — ABNORMAL LOW (ref 13.0–18.0)
MCH: 28.3 pg (ref 26.0–34.0)
MCHC: 32.3 g/dL (ref 32.0–36.0)
MCV: 87.7 fL (ref 80.0–100.0)
PLATELETS: 189 10*3/uL (ref 150–440)
RBC: 3.42 MIL/uL — AB (ref 4.40–5.90)
RDW: 14.5 % (ref 11.5–14.5)
WBC: 4.7 10*3/uL (ref 3.8–10.6)

## 2017-02-28 LAB — TROPONIN I

## 2017-02-28 MED ORDER — TIOTROPIUM BROMIDE MONOHYDRATE 18 MCG IN CAPS
18.0000 ug | ORAL_CAPSULE | Freq: Every day | RESPIRATORY_TRACT | Status: DC
  Administered 2017-02-28 – 2017-03-02 (×3): 18 ug via RESPIRATORY_TRACT
  Filled 2017-02-28: qty 5

## 2017-02-28 MED ORDER — IPRATROPIUM-ALBUTEROL 0.5-2.5 (3) MG/3ML IN SOLN
3.0000 mL | Freq: Once | RESPIRATORY_TRACT | Status: AC
  Administered 2017-02-28: 3 mL via RESPIRATORY_TRACT
  Filled 2017-02-28: qty 3

## 2017-02-28 MED ORDER — LOSARTAN POTASSIUM-HCTZ 50-12.5 MG PO TABS: 1.0000 | ORAL_TABLET | Freq: Every day | ORAL | Status: DC

## 2017-02-28 MED ORDER — ENOXAPARIN SODIUM 40 MG/0.4ML ~~LOC~~ SOLN
40.0000 mg | SUBCUTANEOUS | Status: DC
  Administered 2017-02-28 – 2017-03-01 (×2): 40 mg via SUBCUTANEOUS
  Filled 2017-02-28 (×2): qty 0.4

## 2017-02-28 MED ORDER — DOCUSATE SODIUM 100 MG PO CAPS
100.0000 mg | ORAL_CAPSULE | Freq: Two times a day (BID) | ORAL | Status: DC
  Administered 2017-02-28: 100 mg via ORAL
  Filled 2017-02-28 (×4): qty 1

## 2017-02-28 MED ORDER — IPRATROPIUM-ALBUTEROL 0.5-2.5 (3) MG/3ML IN SOLN
RESPIRATORY_TRACT | Status: DC
Start: 2017-02-28 — End: 2017-02-28
  Filled 2017-02-28: qty 6

## 2017-02-28 MED ORDER — ACETAMINOPHEN 650 MG RE SUPP: 650.0000 mg | Freq: Four times a day (QID) | RECTAL | Status: DC | PRN

## 2017-02-28 MED ORDER — IPRATROPIUM-ALBUTEROL 0.5-2.5 (3) MG/3ML IN SOLN
3.0000 mL | RESPIRATORY_TRACT | Status: DC
  Administered 2017-02-28 – 2017-03-01 (×4): 3 mL via RESPIRATORY_TRACT
  Filled 2017-02-28 (×6): qty 3

## 2017-02-28 MED ORDER — LOSARTAN POTASSIUM 50 MG PO TABS
50.0000 mg | ORAL_TABLET | Freq: Every day | ORAL | Status: DC
  Administered 2017-02-28 – 2017-03-01 (×2): 50 mg via ORAL
  Filled 2017-02-28 (×2): qty 1

## 2017-02-28 MED ORDER — HYDROCODONE-ACETAMINOPHEN 5-325 MG PO TABS: 1.0000 | ORAL_TABLET | ORAL | Status: DC | PRN

## 2017-02-28 MED ORDER — PIOGLITAZONE HCL 30 MG PO TABS
30.0000 mg | ORAL_TABLET | Freq: Every day | ORAL | Status: DC
  Administered 2017-02-28 – 2017-03-01 (×2): 30 mg via ORAL
  Filled 2017-02-28 (×3): qty 1

## 2017-02-28 MED ORDER — HYDROCODONE-ACETAMINOPHEN 5-325 MG PO TABS
1.0000 | ORAL_TABLET | Freq: Three times a day (TID) | ORAL | Status: DC | PRN
  Administered 2017-02-28 – 2017-03-02 (×3): 1 via ORAL
  Filled 2017-02-28 (×3): qty 1

## 2017-02-28 MED ORDER — METHYLPREDNISOLONE SODIUM SUCC 125 MG IJ SOLR
60.0000 mg | Freq: Two times a day (BID) | INTRAMUSCULAR | Status: DC
  Administered 2017-02-28 – 2017-03-01 (×2): 60 mg via INTRAVENOUS
  Filled 2017-02-28 (×2): qty 2

## 2017-02-28 MED ORDER — BISACODYL 5 MG PO TBEC: 5.0000 mg | DELAYED_RELEASE_TABLET | Freq: Every day | ORAL | Status: DC | PRN

## 2017-02-28 MED ORDER — ACETAMINOPHEN 325 MG PO TABS
650.0000 mg | ORAL_TABLET | Freq: Four times a day (QID) | ORAL | Status: DC | PRN
  Filled 2017-02-28: qty 2

## 2017-02-28 MED ORDER — ONDANSETRON HCL 4 MG/2ML IJ SOLN: 4.0000 mg | Freq: Four times a day (QID) | INTRAMUSCULAR | Status: DC | PRN

## 2017-02-28 MED ORDER — HYDROCHLOROTHIAZIDE 12.5 MG PO CAPS
12.5000 mg | ORAL_CAPSULE | Freq: Every day | ORAL | Status: DC
  Administered 2017-02-28 – 2017-03-01 (×2): 12.5 mg via ORAL
  Filled 2017-02-28 (×2): qty 1

## 2017-02-28 MED ORDER — GUAIFENESIN ER 600 MG PO TB12
600.0000 mg | ORAL_TABLET | Freq: Two times a day (BID) | ORAL | Status: DC
  Administered 2017-02-28 – 2017-03-01 (×4): 600 mg via ORAL
  Filled 2017-02-28 (×4): qty 1

## 2017-02-28 MED ORDER — METHYLPREDNISOLONE SODIUM SUCC 125 MG IJ SOLR
125.0000 mg | Freq: Once | INTRAMUSCULAR | Status: AC
  Administered 2017-02-28: 125 mg via INTRAVENOUS
  Filled 2017-02-28: qty 2

## 2017-02-28 MED ORDER — SODIUM CHLORIDE 0.9 % IV SOLN
INTRAVENOUS | Status: DC
  Administered 2017-02-28 – 2017-03-01 (×3): via INTRAVENOUS

## 2017-02-28 MED ORDER — INSULIN ASPART 100 UNIT/ML ~~LOC~~ SOLN
0.0000 [IU] | Freq: Three times a day (TID) | SUBCUTANEOUS | Status: DC
  Administered 2017-02-28 – 2017-03-01 (×3): 3 [IU] via SUBCUTANEOUS
  Filled 2017-02-28 (×3): qty 3
  Filled 2017-02-28: qty 2

## 2017-02-28 MED ORDER — ONDANSETRON HCL 4 MG PO TABS: 4.0000 mg | ORAL_TABLET | Freq: Four times a day (QID) | ORAL | Status: DC | PRN

## 2017-02-28 MED ORDER — TRAZODONE HCL 50 MG PO TABS
25.0000 mg | ORAL_TABLET | Freq: Every evening | ORAL | Status: DC | PRN
  Administered 2017-03-01 – 2017-03-02 (×2): 25 mg via ORAL
  Filled 2017-02-28 (×2): qty 1

## 2017-02-28 NOTE — Progress Notes (Signed)
Taken off BIPAP per dr Corky Downs, pt placed on 3lpm , sats 99%,respiratory rate 28/min, mild dyspnea but patient able to converse. Will continue to monitor, RN and MD notified.

## 2017-02-28 NOTE — ED Triage Notes (Signed)
Pt presents to ED via [facility] c/o difficulty breathing. Arrives on CPAP with EMS, given albuterol neb PTA. EMS report wheezing and rhonchi bilaterally. Hx lung CA, COPD, asthma.

## 2017-02-28 NOTE — Assessment & Plan Note (Deleted)
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 Hemoglobin approximately 9.6. Discontinued ibrutinib sec to financial issues. Bendamustine discontinued because of side effects. For now continue surveillance. We'll repeat the scan few months again.  # Advanced COPD- question exacerbation. Patient's pulse ox on 4 L 70-80%. Patient will likely more steroids; breathing treatments. Patient to go to the emergency room.  # Pain- chest when coughing- continue pain meds prn. New script given.   # follow up with me in 4 weeks/labs.

## 2017-02-28 NOTE — ED Provider Notes (Signed)
[facility] Emergency Department Provider Note   ____________________________________________    I have reviewed the triage vital signs and the nursing notes.   HISTORY  Chief Complaint Respiratory Distress     HPI Craig [name]. is a 66 y.o. male who presents with respiratory distress. Patient reportedly has a history of COPD and is on 3 L nasal cannula at home, he also has lung cancer but is not currently undergoing chemotherapy. He woke up this morning acutely short of breath and gasping for air per partner. EMS started the patient on CPAP because of his distress. Patient denies chest pain. No fevers reported by family. No cough reported by family   Past Medical History:  Diagnosis Date  . Asthma   . Cancer ([place])   . Colon cancer ([place])   . COPD (chronic obstructive pulmonary disease) ([place])   . Diabetes mellitus without complication ([place])   . Difficulty sleeping   . Dysrhythmia   . GERD (gastroesophageal reflux disease)   . Glaucoma   . History of bronchitis   . Hypertension   . Lung cancer ([place])    right upper lobe mass positive for adenocarcinoma  . Lymphoma (H[facility])    low grade  . Renal cancer ([place])   . Respiratory failure ([place]) 07/12/2010   acute  . Right renal mass   . Shortness of breath dyspnea   . Stroke ([place]) 1990'S   NO RESIDUAL PROBLEMS  . Supplemental oxygen dependent    USES 3 LITERS CONTINUOUSLY    Patient Active Problem List   Diagnosis Date Noted  . Sepsis ([place]) 12/07/2016  . Portacath in place 11/05/2016  . Cancer associated pain 08/22/2016  . Anemia in neoplastic disease 08/22/2016  . Tachycardia 08/22/2016  . 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) ([place]) 08/22/2016  . Chronic bronchitis ([place]) 06/02/2016  . Chronic renal insufficiency 06/02/2016  . Benign essential HTN 06/02/2016  . Diabetes ([place]) 06/02/2016  . Primary cancer of right upper lobe of lung ([place]) 06/01/2016  . Renal carcinoma  ([place]) 02/29/2016  . Cancer of lung ([place]) 12/21/2015  . COPD exacerbation ([place]) 11/19/2015  . Acute bronchitis 11/19/2015  . SOB (shortness of breath) 11/19/2015  . Acute respiratory distress 11/19/2015  . Glaucoma 04/15/2015  . 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 BP (high blood pressure) 03/02/2015  . Cannot sleep 03/02/2015  . Controlled type 2 diabetes mellitus without complication ([place]) 07/07/3006  . Lung cancer, upper lobe ([place]) 02/03/2015  . Renal cell cancer ([place]) 02/03/2015  . Lymphoma, small lymphocytic ([place]) 02/03/2015    Past Surgical History:  Procedure Laterality Date  . IR GENERIC HISTORICAL  05/31/2016   IR RADIOLOGIST EVAL & MGMT 05/31/2016 Aletta Edouard, MD [facility] INTERV RAD    Prior to Admission medications   Medication Sig Start Date End Date Taking? Authorizing Provider  Acalabrutinib 100 MG CAPS Take 100 mg by mouth every 12 (twelve) hours. 01/04/17  Yes [name], MD  budesonide-formoterol (SYMBICORT) 160-4.5 MCG/ACT inhaler Inhale 2 puffs into the lungs 2 (two) times daily.    Yes [provider]  losartan-hydrochlorothiazide (HYZAAR) 50-12.5 MG per tablet Take 1 tablet by mouth daily.   Yes [provider]  pioglitazone (ACTOS) 30 MG tablet Take 30 mg by mouth daily.   Yes [provider]  predniSONE (DELTASONE) 10 MG tablet TAKE 1 TABLET (10 MG TOTAL) BY MOUTH DAILY WITH BREAKFAST. 11/30/16  Yes [name], MD  tiotropium (SPIRIVA) 18  MCG inhalation capsule Place 18 mcg into inhaler and inhale daily.    Yes [provider]  albuterol (PROVENTIL HFA;VENTOLIN HFA) 108 (90 BASE) MCG/ACT inhaler Inhale 2 puffs into the lungs every 6 (six) hours as needed for wheezing or shortness of breath.     [provider]  albuterol (PROVENTIL) (2.5 MG/3ML) 0.083% nebulizer solution USE 1 VIAL IN NEBULIZER EVERY 3-4 HOURS AS NEEDED 10/10/16   [name], MD  dextromethorphan-guaiFENesin [facility] DM) 30-600 MG  12hr tablet Take 1 tablet by mouth 2 (two) times daily. Patient not taking: Reported on 02/28/2017 02/01/17   Fritzi Mandes, MD  diphenhydrAMINE (BENADRYL) 25 MG tablet Take 2 tablets (50 mg total) by mouth as directed. Patient taking differently: Take 25 mg by mouth as needed for itching or allergies.  04/12/16   Aletta Edouard, MD  HYDROcodone-acetaminophen (NORCO/VICODIN) 5-325 MG tablet Take 1 tablet by mouth every 8 (eight) hours as needed for moderate pain. Please Note new directions 01/30/17   [name], MD  levofloxacin (LEVAQUIN) 500 MG tablet Take 1 tablet (500 mg total) by mouth daily. Patient not taking: Reported on 02/05/2017 02/01/17   Fritzi Mandes, MD  loperamide (IMODIUM A-D) 2 MG tablet Take 2 mg by mouth 4 (four) times daily as needed for diarrhea or loose stools. Reported on 12/21/2015    [provider]  predniSONE (DELTASONE) 10 MG tablet Take 50 mg daily taper by 10 mg and then resume your daily 10 mg daily dose after 5 days Patient not taking: Reported on 02/28/2017 02/01/17   Fritzi Mandes, MD  temazepam (RESTORIL) 15 MG capsule Take 15 mg by mouth at bedtime as needed.  08/07/16   [provider]     Allergies Iodinated diagnostic agents and Nexium [esomeprazole magnesium]  Family History  Problem Relation Age of Onset  . Diabetes Mellitus II Mother   . Hypertension Mother   . Cancer Mother   . Heart disease Father   . Hypertension Father   . Tuberculosis Father   . Diabetes Mellitus II Sister   . Cancer Sister   . Prostate cancer Brother     Social History Social History  Substance Use Topics  . Smoking status: Former Smoker    Packs/day: 1.00    Years: 30.00    Types: Cigarettes    Start date: 12/28/1968    Quit date: 01/13/2010  . Smokeless tobacco: Never Used  . Alcohol use No     Comment: stopped 2002     Review of Systems (after patient off bipap)  Constitutional: No fever/chills Eyes: No visual changes.  ENT: No sore  throat. Cardiovascular: Denies chest pain. Respiratory: As above Gastrointestinal: No abdominal pain.  No nausea, no vomiting.   Genitourinary: Negative for dysuria. Musculoskeletal: Negative for back pain. Skin: Negative for rash. Neurological: Negative for headaches or weakness   ____________________________________________   PHYSICAL EXAM:  VITAL SIGNS: ED Triage Vitals  Enc Vitals Group     BP 02/28/17 0923 112/81     Pulse Rate 02/28/17 0923 (!) 117     Resp 02/28/17 0923 15     Temp 02/28/17 0923 98.1 F (36.7 C)     Temp Source 02/28/17 0923 Oral     SpO2 02/28/17 0923 100 %     Weight 02/28/17 0924 140 lb (63.5 kg)     Height 02/28/17 0924 6' (1.829 m)     Head Circumference --      [place]ak Flow --  Pain Score 02/28/17 1230 0     Pain Loc --      Pain Edu? --      Excl. in [place]? --     Constitutional: Alert and oriented. Moderate distress.  Eyes: Conjunctivae are normal.  Head: Atraumatic. Nose: No congestion/rhinnorhea. Mouth/Throat: Mucous membranes are moist.   Neck:  Painless ROM Cardiovascular: Tachycardia, regular rhythm. Grossly normal heart sounds.  Good peripheral circulation. Respiratory: Increased respiratory effort without tachypnea, positive retractions, poor airflow bilaterally Gastrointestinal: Soft and nontender. No distention.  No CVA tenderness. Genitourinary: deferred Musculoskeletal: No lower extremity tenderness nor edema.  Warm and well perfused Neurologic:  Normal speech and language. No gross focal neurologic deficits are appreciated.  Skin:  Skin is warm, dry and intact. No rash noted. Psychiatric: Mood and affect are normal. Speech and behavior are normal.  ____________________________________________   LABS (all labs ordered are listed, but only abnormal results are displayed)  Labs Reviewed  BASIC METABOLIC PANEL - Abnormal; Notable for the following:       Result Value   Chloride 94 (*)    CO2 40 (*)    Glucose, Bld 117  (*)    Calcium 8.7 (*)    All other components within normal limits  CBC - Abnormal; Notable for the following:    RBC 3.42 (*)    Hemoglobin 9.7 (*)    HCT 29.9 (*)    All other components within normal limits  GLUCOSE, CAPILLARY - Abnormal; Notable for the following:    Glucose-Capillary 106 (*)    All other components within normal limits  TROPONIN I   ____________________________________________  EKG   ____________________________________________  RADIOLOGY  Chest x-ray unremarkable ____________________________________________   PROCEDURES  Procedure(s) performed: No    Critical Care performed:yes  CRITICAL CARE Performed by: Lavonia Drafts   Total critical care time: 30 minutes  Critical care time was exclusive of separately billable procedures and treating other patients.  Critical care was necessary to treat or prevent imminent or life-threatening deterioration.  Critical care was time spent personally by me on the following activities: development of treatment plan with patient and/or surrogate as well as nursing, discussions with consultants, evaluation of patient's response to treatment, examination of patient, obtaining history from patient or surrogate, ordering and performing treatments and interventions, ordering and review of laboratory studies, ordering and review of radiographic studies, pulse oximetry and re-evaluation of patient's condition.  ____________________________________________   INITIAL IMPRESSION / ASSESSMENT AND PLAN / ED COURSE  Pertinent labs & imaging results that were available during my care of the patient were reviewed by me and considered in my medical decision making (see chart for details).  Patient presents with respiratory distress. We transitioned him immediately from CPAP to BiPAP, gave IV Solu-Medrol, and multiple DuoNebs.  After approximately 45 minutes on BiPAP the patient was able to transitioned to nasal cannula,  he did feel better however he is still tachypneic with retractions and no significant improvement on lung exam.  He was admitted to the hospitalist service for further management    ____________________________________________   FINAL CLINICAL IMPRESSION(S) / ED DIAGNOSES  Final diagnoses:  Acute respiratory failure with hypoxia ([place])  COPD exacerbation ([place])      NEW MEDICATIONS STARTED DURING THIS VISIT:  Current Discharge Medication List       Note:  This document was prepared using Dragon voice recognition software and may include unintentional dictation errors.    Lavonia Drafts, MD 02/28/17 [phone]

## 2017-02-28 NOTE — Progress Notes (Deleted)
[place] OFFICE PROGRESS NOTE  Patient Care Team: [name], MD as PCP - General (Family Medicine)  Cancer Staging Lung cancer, upper lobe [facility]) Staging form: Lung, AJCC 7th Edition - Clinical stage from 06/15/2010: yT2, N2, M0 - Signed by [name], NP on 02/03/2015 - Pathologic stage from 04/04/2014: Maureen Chatters, M1 - Signed by [name], NP on 02/03/2015  Lymphoma, small lymphocytic ([place]) Staging form: Lymphoid Neoplasms, AJCC 6th Edition - Clinical stage from 10/15/2013: Stage II - Signed by [name], NP on 02/03/2015  Renal cell cancer [facility]) Staging form: Kidney, AJCC 7th Edition - Clinical stage from 02/03/2015: Stage I (yT1a, N0, M0) - Signed by [name], NP on 02/03/2015    Oncology History   1. Right upper lobe lung mass biopsies positive for adenocarcinoma; PET- T2, N2, M0;  stage IIIA;  carboplatinum Alimta and Avastin;  [april  9th of 2013]  Patient had an allergic reaction to carboplatinum with acute bronchospasm in May of 2013; carbo-discontinued.; Maintenance Alimta and Avastin  from June of 2013 hold chemotherapy because of increasing shortness of breath (in July, 2013); VARISTRAT-GOOD; Started on Wyocena February 3 about 2014; Diarrhea 4 days after Tarceva was started.  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   # SEP 2017- GAZYVA x1 [stopped sec to respi issues]  # Nov 1st week- START Ibrutinib 2 pills day; Last Jan 22nd 2018 [sec to co-pay].   # FEB 19th- cycle #1 Bendamustine [day-1& 2- '70mg'$ /m2; Rituxan with cycle #2  # RIGHT KIDNEY CA- RCC [s/p Bx]- s/p  Cryo [Dr.Yamagat; IR; 2016]   #  Poor pulmonary function; 3 L home O2.     Lung cancer, upper lobe ([place])   02/03/2015 Initial Diagnosis    Lung cancer, upper lobe       Lymphoma, small lymphocytic ([place])   02/03/2015 Initial Diagnosis    Lymphoma, small lymphocytic (HCC)      Cancer of lung ([place])   12/21/2015 Initial Diagnosis    Cancer of lung ([place])      Primary cancer of right upper lobe of lung ([place])   06/01/2016 Initial Diagnosis    Primary cancer of right upper lobe of lung (HCC)       INTERVAL HISTORY:  Craig [name]. 66 y.o.  male pleasant patientHistory of chronic respiratory failure on home O2/Severe COPD with above history of Multiple malignancies- most recently with progressive SLL- Currently on surveillance because of poor tolerance to bendamustine; and also improvement noted discontinued because of financial issues.  Patient states that she was recently evaluated by his pulmonologist Dr. Raul Del for worsening shortness of breath; needed IV steroids and also needed breathing treatments. Patient states he is on steroids. However in the last few days he noted to have worsening shortness of breath and cough. No hemoptysis. No fevers or chills. Positive for phlegm.  Continues to have chronic chest wall pain- for which is on narcotic pain medication. He denies any weight loss. Denies any night sweats.   REVIEW OF SYSTEMS:  A complete 10 point review  of system is done which is negative except mentioned above/history of present illness.   PAST MEDICAL HISTORY :  Past Medical History:  Diagnosis Date  . Asthma   . Cancer ([place])   . Colon cancer ([place])   . COPD (chronic obstructive pulmonary disease) ([place])   . Diabetes mellitus without complication ([place])   . Difficulty sleeping   . Dysrhythmia   . GERD (gastroesophageal reflux disease)   . Glaucoma   . History of bronchitis   . Hypertension   . Lung cancer ([place])    right upper lobe mass positive for  adenocarcinoma  . Lymphoma (HCC)    low grade  . Renal cancer ([name])   . Respiratory failure ([place]) 07/12/2010   acute  . Right renal mass   . Shortness of breath dyspnea   . Stroke ([place]) 1990'S   NO RESIDUAL PROBLEMS  . Supplemental oxygen dependent    USES 3 LITERS CONTINUOUSLY    PAST SURGICAL HISTORY :   Past Surgical History:  Procedure Laterality Date  . IR GENERIC HISTORICAL  05/31/2016   IR RADIOLOGIST EVAL & MGMT 05/31/2016 Aletta Edouard, MD [facility] INTERV RAD    FAMILY HISTORY :   Family History  Problem Relation Age of Onset  . Diabetes Mellitus II Mother   . Hypertension Mother   . Cancer Mother   . Heart disease Father   . Hypertension Father   . Tuberculosis Father   . Diabetes Mellitus II Sister   . Cancer Sister   . Prostate cancer Brother     SOCIAL HISTORY:   Social History  Substance Use Topics  . Smoking status: Former Smoker    Packs/day: 1.00    Years: 30.00    Types: Cigarettes    Start date: 12/28/1968    Quit date: 01/13/2010  . Smokeless tobacco: Never Used  . Alcohol use No     Comment: stopped 2002     ALLERGIES:  is allergic to iodinated diagnostic agents and nexium [esomeprazole magnesium].  MEDICATIONS:  Current Outpatient Prescriptions  Medication Sig Dispense Refill  . Acalabrutinib 100 MG CAPS Take 100 mg by mouth every 12 (twelve) hours. (Patient not taking: Reported on 01/30/2017) 60 capsule 4  . albuterol (PROVENTIL HFA;VENTOLIN HFA) 108 (90 BASE) MCG/ACT inhaler Inhale 2 puffs into the lungs every 6 (six) hours as needed for wheezing or shortness of breath.     Marland Kitchen albuterol (PROVENTIL) (2.5 MG/3ML) 0.083% nebulizer solution USE 1 VIAL IN NEBULIZER EVERY 3-4 HOURS AS NEEDED 75 mL 3  . budesonide-formoterol (SYMBICORT) 160-4.5 MCG/ACT inhaler Inhale 2 puffs into the lungs 2 (two) times daily.     Marland Kitchen dextromethorphan-guaiFENesin (MUCINEX DM) 30-600 MG 12hr tablet Take 1 tablet by mouth 2 (two) times daily. (Patient not taking: Reported  on 02/05/2017) 16 tablet 0  . diphenhydrAMINE (BENADRYL) 25 MG tablet Take 2 tablets (50 mg total) by mouth as directed. (Patient taking differently: Take 25 mg by mouth as needed for itching or allergies. ) 2 tablet 0  . HYDROcodone-acetaminophen (NORCO/VICODIN) 5-325 MG tablet Take 1 tablet by mouth every 8 (eight) hours as needed for moderate pain. Please Note new directions 90 tablet 0  . levofloxacin (LEVAQUIN) 500 MG tablet Take 1 tablet (500 mg total) by mouth daily. (Patient not taking: Reported on 02/05/2017) 6 tablet 0  . loperamide (IMODIUM A-D) 2 MG tablet Take 2 mg by mouth 4 (four) times daily as needed for diarrhea or loose stools. Reported on 12/21/2015    .  losartan-hydrochlorothiazide (HYZAAR) 50-12.5 MG per tablet Take 1 tablet by mouth daily.    . pioglitazone (ACTOS) 30 MG tablet Take 30 mg by mouth daily.    . predniSONE (DELTASONE) 10 MG tablet TAKE 1 TABLET (10 MG TOTAL) BY MOUTH DAILY WITH BREAKFAST. 30 tablet 3  . predniSONE (DELTASONE) 10 MG tablet Take 50 mg daily taper by 10 mg and then resume your daily 10 mg daily dose after 5 days 15 tablet 0  . temazepam (RESTORIL) 15 MG capsule Take 15 mg by mouth at bedtime as needed.     . tiotropium (SPIRIVA) 18 MCG inhalation capsule Place 18 mcg into inhaler and inhale daily.      No current facility-administered medications for this visit.    Facility-Administered Medications Ordered in Other Visits  Medication Dose Route Frequency Provider Last Rate Last Dose  . sodium chloride 0.9 % injection 10 mL  10 mL Intracatheter PRN Choksi, Janak, MD   10 mL at 04/28/15 1410  . sodium chloride 0.9 % injection 10 mL  10 mL Intravenous PRN Choksi, Delorise Shiner, MD   10 mL at 05/12/15 1350    PHYSICAL EXAMINATION: ECOG PERFORMANCE STATUS: 2 - Symptomatic, <50% confined to bed  There were no vitals taken for this visit.  There were no vitals filed for this visit.  GENERAL: Well-nourished well-developed; Alert, no distress and comfortable.    Alone. He is on home oxygen. He is in a wheelchair.  EYES: no pallor or icterus OROPHARYNX: no thrush or ulceration; good dentition  NECK: supple, no masses felt LYMPH:  no palpable lymphadenopathy in the cervical, axillary or inguinal regions LUNGS:Bilateral decreased air entry to auscultationn and  No wheeze or crackles HEART/CVS: regular rate & rhythm and no murmurs; No lower extremity edema ABDOMEN:abdomen soft, non-tender and normal bowel sounds Musculoskeletal:no cyanosis of digits and no clubbing  PSYCH: alert & oriented x 3 with fluent speech NEURO: no focal motor/sensory deficits SKIN:  no rashes or significant lesions  LABORATORY DATA:  I have reviewed the data as listed    Component Value Date/Time   NA 142 01/31/2017 0508   NA 139 01/17/2015 0913   K 4.8 01/31/2017 0508   K 3.8 01/17/2015 0913   CL 97 (L) 01/31/2017 0508   CL 99 (L) 01/17/2015 0913   CO2 40 (H) 01/31/2017 0508   CO2 35 (H) 01/17/2015 0913   GLUCOSE 174 (H) 01/31/2017 0508   GLUCOSE 169 (H) 01/17/2015 0913   BUN 31 (H) 01/31/2017 0508   BUN 15 01/17/2015 0913   CREATININE 1.17 01/31/2017 0508   CREATININE 1.23 01/17/2015 0913   CALCIUM 8.3 (L) 01/31/2017 0508   CALCIUM 8.8 (L) 01/17/2015 0913   PROT 6.9 01/30/2017 1405   PROT 6.6 01/17/2015 0913   ALBUMIN 4.1 01/30/2017 1405   ALBUMIN 3.7 01/17/2015 0913   AST 17 01/30/2017 1405   AST 20 01/17/2015 0913   ALT 15 (L) 01/30/2017 1405   ALT 15 (L) 01/17/2015 0913   ALKPHOS 76 01/30/2017 1405   ALKPHOS 76 01/17/2015 0913   BILITOT 0.4 01/30/2017 1405   BILITOT 0.5 01/17/2015 0913   GFRNONAA >60 01/31/2017 0508   GFRNONAA >60 01/17/2015 0913   GFRAA >60 01/31/2017 0508   GFRAA >60 01/17/2015 0913    No results found for: SPEP, UPEP  Lab Results  Component Value Date   WBC 4.4 01/31/2017   NEUTROABS 5.3 01/30/2017   HGB 8.7 (L) 01/31/2017   HCT 26.7 (L) 01/31/2017  MCV 87.7 01/31/2017   PLT 180 01/31/2017      Chemistry       Component Value Date/Time   NA 142 01/31/2017 0508   NA 139 01/17/2015 0913   K 4.8 01/31/2017 0508   K 3.8 01/17/2015 0913   CL 97 (L) 01/31/2017 0508   CL 99 (L) 01/17/2015 0913   CO2 40 (H) 01/31/2017 0508   CO2 35 (H) 01/17/2015 0913   BUN 31 (H) 01/31/2017 0508   BUN 15 01/17/2015 0913   CREATININE 1.17 01/31/2017 0508   CREATININE 1.23 01/17/2015 0913      Component Value Date/Time   CALCIUM 8.3 (L) 01/31/2017 0508   CALCIUM 8.8 (L) 01/17/2015 0913   ALKPHOS 76 01/30/2017 1405   ALKPHOS 76 01/17/2015 0913   AST 17 01/30/2017 1405   AST 20 01/17/2015 0913   ALT 15 (L) 01/30/2017 1405   ALT 15 (L) 01/17/2015 0913   BILITOT 0.4 01/30/2017 1405   BILITOT 0.5 01/17/2015 0913       RADIOGRAPHIC STUDIES: I have personally reviewed the radiological images as listed and agreed with the findings in the report. No results found.   ASSESSMENT & PLAN:  No problem-specific Assessment & Plan notes found for this encounter.   No orders of the defined types were placed in this encounter.     [name], MD 02/28/2017 7:55 AM

## 2017-02-28 NOTE — H&P (Signed)
**Note Craig via Obfuscation** [place] [facility] [place] NAME: Craig [name]    MR#:  [id number]  DATE OF BIRTH:  12-Oct-1951  DATE OF ADMISSION:  02/28/2017  PRIMARY CARE PHYSICIAN: [name], MD   REQUESTING/REFERRING PHYSICIAN: Dr. Corky Downs  CHIEF COMPLAINT: Shortness of breath    Chief Complaint  Patient presents with  . Respiratory Distress    HISTORY OF PRESENT ILLNESS:  Craig [name]  is a 66 y.o. male with a known history ofEssential hypertension, COPD, history of lung cancer, lymphoma comes in because of shortness of breath. Patient has advanced COPD and on 3 L of oxygen all the time. No true shortness of breath worse today morning,. patient brought in by EMS., started on BiPAP by EMS. Patient was on BiPAP of couple of hours in the emergency room and then changed to nasal cannula on 3L. When he came he was not able to talk full sentences but now he is able to talk with sentences  but swhile talking his O2 sats dropped to upper 80s but quickly returned to above 95%. Patient has a history of right lung cancer, lymphoma, currently not on any treatment but on surveillance.  PAST MEDICAL HISTORY:   Past Medical History:  Diagnosis Date  . Asthma   . Cancer ([place])   . Colon cancer ([place])   . COPD (chronic obstructive pulmonary disease) ([place])   . Diabetes mellitus without complication ([place])   . Difficulty sleeping   . Dysrhythmia   . GERD (gastroesophageal reflux disease)   . Glaucoma   . History of bronchitis   . Hypertension   . 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 Lymphoma (HCC)    low grade  . Renal cancer ([place])   . Respiratory failure ([place]) 07/12/2010   acute  . Right renal mass   . Shortness of breath dyspnea   . Stroke ([place]) 1990'S   NO RESIDUAL PROBLEMS  . Supplemental oxygen dependent    USES 3 LITERS CONTINUOUSLY    PAST SURGICAL HISTOIRY:   Past Surgical History:  Procedure Laterality Date  . IR  GENERIC HISTORICAL  05/31/2016   IR RADIOLOGIST EVAL & MGMT 05/31/2016 Aletta Edouard, MD [facility] INTERV RAD    SOCIAL HISTORY:   Social History  Substance Use Topics  . Smoking status: Former Smoker    Packs/day: 1.00    Years: 30.00    Types: Cigarettes    Start date: 12/28/1968    Quit date: 01/13/2010  . Smokeless tobacco: Never Used  . Alcohol use No     Comment: stopped 2002     FAMILY HISTORY:   Family History  Problem Relation Age of Onset  . Diabetes Mellitus II Mother   . Hypertension Mother   . Cancer Mother   . Heart disease Father   . Hypertension Father   . Tuberculosis Father   . Diabetes Mellitus II Sister   . Cancer Sister   . Prostate cancer Brother     DRUG ALLERGIES:   Allergies  Allergen Reactions  . Iodinated Diagnostic Agents Shortness Of Breath and Nausea And Vomiting    13-hour prep  . Nexium [Esomeprazole Magnesium] Other (See Comments)    headache    REVIEW OF SYSTEMS:  CONSTITUTIONAL:No fever but has fatigue  EYES: No blurred or double vision.  EARS, NOSE, AND THROAT: No tinnitus or ear pain.  RESPIRATORY:Cough, shortness of breath since this morning got worse.  CARDIOVASCULAR: No chest pain, orthopnea, edema.  GASTROINTESTINAL: No nausea, vomiting, diarrhea or abdominal pain.  GENITOURINARY: No dysuria, hematuria.  ENDOCRINE: No polyuria, nocturia,  HEMATOLOGY: No anemia, easy bruising or bleeding SKIN: No rash or lesion. MUSCULOSKELETAL: No joint pain or arthritis.   NEUROLOGIC: No tingling, numbness, weakness.  PSYCHIATRY: No anxiety or depression.   MEDICATIONS AT HOME:   Prior to Admission medications   Medication Sig Start Date End Date Taking? Authorizing Provider  Acalabrutinib 100 MG CAPS Take 100 mg by mouth every 12 (twelve) hours. 01/04/17  Yes [name], MD  budesonide-formoterol (SYMBICORT) 160-4.5 MCG/ACT inhaler Inhale 2 puffs into the lungs 2 (two) times daily.    Yes [provider]   losartan-hydrochlorothiazide (HYZAAR) 50-12.5 MG per tablet Take 1 tablet by mouth daily.   Yes [provider]  pioglitazone (ACTOS) 30 MG tablet Take 30 mg by mouth daily.   Yes [provider]  predniSONE (DELTASONE) 10 MG tablet TAKE 1 TABLET (10 MG TOTAL) BY MOUTH DAILY WITH BREAKFAST. 11/30/16  Yes [name], MD  tiotropium (SPIRIVA) 18 MCG inhalation capsule Place 18 mcg into inhaler and inhale daily.    Yes [provider]  albuterol (PROVENTIL HFA;VENTOLIN HFA) 108 (90 BASE) MCG/ACT inhaler Inhale 2 puffs into the lungs every 6 (six) hours as needed for wheezing or shortness of breath.     [provider]  albuterol (PROVENTIL) (2.5 MG/3ML) 0.083% nebulizer solution USE 1 VIAL IN NEBULIZER EVERY 3-4 HOURS AS NEEDED 10/10/16   [name], MD  dextromethorphan-guaiFENesin [facility] DM) 30-600 MG 12hr tablet Take 1 tablet by mouth 2 (two) times daily. Patient not taking: Reported on 02/28/2017 02/01/17   Fritzi Mandes, MD  diphenhydrAMINE (BENADRYL) 25 MG tablet Take 2 tablets (50 mg total) by mouth as directed. Patient taking differently: Take 25 mg by mouth as needed for itching or allergies.  04/12/16   Aletta Edouard, MD  HYDROcodone-acetaminophen (NORCO/VICODIN) 5-325 MG tablet Take 1 tablet by mouth every 8 (eight) hours as needed for moderate pain. Please Note new directions 01/30/17   [name], MD  levofloxacin (LEVAQUIN) 500 MG tablet Take 1 tablet (500 mg total) by mouth daily. Patient not taking: Reported on 02/05/2017 02/01/17   Fritzi Mandes, MD  loperamide (IMODIUM A-D) 2 MG tablet Take 2 mg by mouth 4 (four) times daily as needed for diarrhea or loose stools. Reported on 12/21/2015    [provider]  predniSONE (DELTASONE) 10 MG tablet Take 50 mg daily taper by 10 mg and then resume your daily 10 mg daily dose after 5 days Patient not taking: Reported on 02/28/2017 02/01/17   Fritzi Mandes, MD  temazepam  (RESTORIL) 15 MG capsule Take 15 mg by mouth at bedtime as needed.  08/07/16   [provider]      VITAL SIGNS:  Blood pressure 120/82, pulse (!) 109, temperature 98.1 F (36.7 C), temperature source Oral, resp. rate (!) 36, height 6' (1.829 m), weight 63.5 kg (140 lb), SpO2 100 %.  PHYSICAL EXAMINATION:  GENERAL:  66 y.o.-year-old patient lying in the bed with no acute distress.  EYES: Pupils equal, round, reactive to light and accommodation. No scleral icterus. Extraocular muscles intact.  HEENT: Head atraumatic, normocephalic. Oropharynx and nasopharynx clear.  NECK:  Supple, no jugular venous distention. No thyroid enlargement, no tenderness.  LUNGS:Bilateral expiratory wheeze in all lung fields.. No use of accessory muscles of respiration.  CARDIOVASCULAR: S1, S2 tachycardic.Marland Kitchen No murmurs, rubs,  or gallops.  ABDOMEN: Soft, nontender, nondistended. Bowel sounds present. No organomegaly or mass.  EXTREMITIES: No pedal edema, cyanosis, or clubbing.  NEUROLOGIC: Cranial nerves II through XII are intact. Muscle strength 5/5 in all extremities. Sensation intact. Gait not checked.  PSYCHIATRIC: The patient is alert and oriented x 3.  SKIN: No obvious rash, lesion, or ulcer.   LABORATORY PANEL:   CBC  Recent Labs Lab 02/28/17 0926  WBC 4.7  HGB 9.7*  HCT 29.9*  PLT 189   ------------------------------------------------------------------------------------------------------------------  Chemistries   Recent Labs Lab 02/28/17 0926  NA 141  K 4.6  CL 94*  CO2 40*  GLUCOSE 117*  BUN 18  CREATININE 1.20  CALCIUM 8.7*   ------------------------------------------------------------------------------------------------------------------  Cardiac Enzymes  Recent Labs Lab 02/28/17 0926  TROPONINI <0.03   ------------------------------------------------------------------------------------------------------------------  RADIOLOGY:  Dg Chest Portable 1  View  Result Date: 02/28/2017 CLINICAL DATA:  Shortness of Breath EXAM: PORTABLE CHEST 1 VIEW COMPARISON:  01/30/2017 FINDINGS: Cardiac shadow is within normal limits. Left-sided chest wall port is again seen in satisfactory position. The lungs are mildly hyperinflated without focal infiltrate or sizable effusion. Multiple healed rib fractures are noted bilaterally. Right upper lobe scarring is again seen. No acute abnormality is noted. IMPRESSION: COPD without acute abnormality. Electronically Signed   By: Inez Catalina M.D.   On: 02/28/2017 09:47    EKG:   Orders placed or performed during the hospital encounter of 02/28/17  . ED EKG  . ED EKG   Sinus tachycardia 1 21 bpm.  IMPRESSION AND PLAN:  66 year old male patient with history of advanced COPD on 3 L of oxygen all the time, history of right lung cancer, lymphoma, GERD, renal cancer comes in because of shortness of breath getting progressively worse associated with cough initially required BiPAP but now on 3 L of oxygen saturation is more than 95% and he she is able to. Full sentences.  #1 acute on chronic COPD exacerbation: Admitted to hospitalist service, started on IV steroids, continue nebulizers with D on nebs every 4 hours, continue 3 units of oxygen, monitor and off unit telemetry. Discussed the CODE STATUS discussed  with the patient and he wants to be full code.  #2. Diabetes mellitus type 2: Continue sliding scale with coverage, continue home dose insulin  #3 chronic kidney disease stage III: Stable #4 history of lung cancer, lymphoma: Not getting any therapy at this time secondary to side effects and also financial troubles, he is on surveillance. #5 s sinus tachycardia secondary to COPD flare, continue gentle hydration, continue treatment for COPD. Monitor on off unit telemetry.  All the records are reviewed and case discussed with ED provider. Management plans discussed with the patient, family and they are in  agreement.  CODE STATUS: full TOTAL TIME TAKING CARE OF THIS PATIENT: 55 minutes.    [name] M.D on 02/28/2017 at 11:59 AM  Between 7am to 6pm - Pager - [phone]  After 6pm go to [email or web address] - password [email or web address] [place] [facility]  Office  [phone]  CC: Primary care physician; [name], MD  Note: This dictation was prepared with Dragon dictation along with smaller phrase technology. Any transcriptional errors that result from this process are unintentional.

## 2017-03-01 DIAGNOSIS — C649 Malignant neoplasm of unspecified kidney, except renal pelvis: Secondary | ICD-10-CM | POA: Diagnosis not present

## 2017-03-01 DIAGNOSIS — E1122 Type 2 diabetes mellitus with diabetic chronic kidney disease: Secondary | ICD-10-CM | POA: Diagnosis not present

## 2017-03-01 DIAGNOSIS — I129 Hypertensive chronic kidney disease with stage 1 through stage 4 chronic kidney disease, or unspecified chronic kidney disease: Secondary | ICD-10-CM | POA: Diagnosis not present

## 2017-03-01 DIAGNOSIS — C3410 Malignant neoplasm of upper lobe, unspecified bronchus or lung: Secondary | ICD-10-CM | POA: Diagnosis not present

## 2017-03-01 DIAGNOSIS — N189 Chronic kidney disease, unspecified: Secondary | ICD-10-CM | POA: Diagnosis not present

## 2017-03-01 DIAGNOSIS — M25532 Pain in left wrist: Secondary | ICD-10-CM | POA: Diagnosis not present

## 2017-03-01 DIAGNOSIS — M6281 Muscle weakness (generalized): Secondary | ICD-10-CM | POA: Diagnosis not present

## 2017-03-01 DIAGNOSIS — J449 Chronic obstructive pulmonary disease, unspecified: Secondary | ICD-10-CM | POA: Diagnosis not present

## 2017-03-01 DIAGNOSIS — C8300 Small cell B-cell lymphoma, unspecified site: Secondary | ICD-10-CM | POA: Diagnosis not present

## 2017-03-01 LAB — CBC
HEMATOCRIT: 24.9 % — AB (ref 40.0–52.0)
HEMOGLOBIN: 8 g/dL — AB (ref 13.0–18.0)
MCH: 27.9 pg (ref 26.0–34.0)
MCHC: 32.2 g/dL (ref 32.0–36.0)
MCV: 86.5 fL (ref 80.0–100.0)
Platelets: 159 10*3/uL (ref 150–440)
RBC: 2.87 MIL/uL — ABNORMAL LOW (ref 4.40–5.90)
RDW: 14.3 % (ref 11.5–14.5)
WBC: 2.7 10*3/uL — ABNORMAL LOW (ref 3.8–10.6)

## 2017-03-01 LAB — BASIC METABOLIC PANEL
ANION GAP: 6 (ref 5–15)
BUN: 32 mg/dL — AB (ref 6–20)
CHLORIDE: 94 mmol/L — AB (ref 101–111)
CO2: 39 mmol/L — ABNORMAL HIGH (ref 22–32)
Calcium: 8 mg/dL — ABNORMAL LOW (ref 8.9–10.3)
Creatinine, Ser: 1.35 mg/dL — ABNORMAL HIGH (ref 0.61–1.24)
GFR calc Af Amer: >60 mL/min (ref >60)
GFR, EST NON AFRICAN AMERICAN: 53 mL/min — AB (ref >60)
GLUCOSE: 149 mg/dL — AB (ref 65–99)
POTASSIUM: 4.9 mmol/L (ref 3.5–5.1)
Sodium: 139 mmol/L (ref 135–145)

## 2017-03-01 LAB — GLUCOSE, CAPILLARY
GLUCOSE-CAPILLARY: 187 mg/dL — AB (ref 65–99)
GLUCOSE-CAPILLARY: 55 mg/dL — AB (ref 65–99)
GLUCOSE-CAPILLARY: 99 mg/dL (ref 65–99)
Glucose-Capillary: 119 mg/dL — ABNORMAL HIGH (ref 65–99)
Glucose-Capillary: 225 mg/dL — ABNORMAL HIGH (ref 65–99)
Glucose-Capillary: 209 mg/dL — ABNORMAL HIGH (ref 65–99)

## 2017-03-01 MED ORDER — IPRATROPIUM-ALBUTEROL 0.5-2.5 (3) MG/3ML IN SOLN
3.0000 mL | RESPIRATORY_TRACT | Status: DC | PRN
  Administered 2017-03-02: 3 mL via RESPIRATORY_TRACT
  Filled 2017-03-01: qty 3

## 2017-03-01 MED ORDER — IPRATROPIUM-ALBUTEROL 0.5-2.5 (3) MG/3ML IN SOLN
3.0000 mL | Freq: Four times a day (QID) | RESPIRATORY_TRACT | Status: DC
  Administered 2017-03-01 – 2017-03-02 (×3): 3 mL via RESPIRATORY_TRACT
  Filled 2017-03-01 (×3): qty 3

## 2017-03-01 MED ORDER — METHYLPREDNISOLONE SODIUM SUCC 40 MG IJ SOLR
40.0000 mg | Freq: Two times a day (BID) | INTRAMUSCULAR | Status: DC
  Administered 2017-03-01 – 2017-03-02 (×2): 40 mg via INTRAVENOUS
  Filled 2017-03-01 (×2): qty 1

## 2017-03-01 MED ORDER — ENSURE ENLIVE PO LIQD
237.0000 mL | Freq: Two times a day (BID) | ORAL | Status: DC
  Administered 2017-03-01: 15:00:00 237 mL via ORAL

## 2017-03-01 MED ORDER — ADULT MULTIVITAMIN W/MINERALS CH
1.0000 | ORAL_TABLET | Freq: Every day | ORAL | Status: DC
  Administered 2017-03-01: 1 via ORAL
  Filled 2017-03-01: qty 1

## 2017-03-01 MED ORDER — MOMETASONE FURO-FORMOTEROL FUM 100-5 MCG/ACT IN AERO
2.0000 | INHALATION_SPRAY | Freq: Two times a day (BID) | RESPIRATORY_TRACT | Status: DC
  Administered 2017-03-01 – 2017-03-02 (×3): 2 via RESPIRATORY_TRACT
  Filled 2017-03-01: qty 8.8

## 2017-03-01 NOTE — Progress Notes (Addendum)
CNA reported patient capillary blood sugar of 55. Patient given 4 ounces of apple juice. Patient is alert and oriented X4 and responsive. Capillary blood sugar will be rechecked in 15 minutes (2121)  Patient has also requested graham crackers and peanut butter. Two (2) packs of graham crackers and Two (2) containers of peanut butter were given.

## 2017-03-01 NOTE — Progress Notes (Addendum)
Initial Nutrition Assessment  DOCUMENTATION CODES:   Severe malnutrition in context of chronic illness  INTERVENTION:   Ensure Enlive po BID, each supplement provides 350 kcal and 20 grams of protein  MVI  NUTRITION DIAGNOSIS:   Malnutrition (severe) related to cancer and cancer related treatments, other (see comment) (COPD) as evidenced by severe depletion of muscle mass, severe depletion of body fat.  GOAL:   Patient will meet greater than or equal to 90% of their needs  MONITOR:   PO intake, Supplement acceptance, Labs, Weight trends  REASON FOR ASSESSMENT:   Malnutrition Screening Tool    ASSESSMENT:   66 y.o. male who presents with respiratory distress. Patient with h/o COPD and is on 3 L nasal cannula at home, he also has lung cancer but is not currently undergoing chemotherapy, lymphoma, CKD III, and DM.     Met with pt in room today. Pt reports intermittent poor appetite for several months pta. Per chart, pt has lost 7lbs(5%) in 3 months; this is not significant. Pt currently eating 100% meals. RD discussed with pt the importance of adequate protein intake to preserve lean muscle mass. Pt would like to try Ensure; RD will order.   Medications reviewed and include: colace, lovenox, insulin, solu-medrol, actos   Labs reviewed: Cl 94(L), CO2 39(H), BUN 32(H), creat 1.35(H), Ca 8.0(L) Wbc- 2.7(L), Hgb 8.0(L), Hct 24.9(L) cbgs- 117, 149 x 24 hrs AIC 6.3(H) -4/19  Nutrition-Focused physical exam completed. Findings are severe fat depletion in arms and chest, severe muscle depletion over entire body, and no edema.   Diet Order:  Diet Carb Modified Fluid consistency: Thin; Room service appropriate? Yes  Skin:  Reviewed, no issues  Last BM:  5/16  Height:   Ht Readings from Last 1 Encounters:  02/28/17 _0  (1.702 m)    Weight:   Wt Readings from Last 1 Encounters:  03/01/17 137 lb 12.8 oz (62.5 kg)    Ideal Body Weight:  67.2 kg  BMI:  Body mass index  is 21.58 kg/m.  Estimated Nutritional Needs:   Kcal:  1850-2150kcal/day   Protein:  93-106g/day   Fluid:  >1.8L/day   EDUCATION NEEDS:   Education needs addressed  Koleen Distance MS, RD, LDN Pager #[phone]

## 2017-03-01 NOTE — Progress Notes (Signed)
Patient capillary blood glucose rechecked at 2133. Patient blood sugar was 99.

## 2017-03-01 NOTE — Consult Note (Signed)
   [facility] CM Inpatient Consult   03/01/2017  Craig [name]. 30-Jun-1951 [id number]   Chart review revealed patient eligible for [place] [facility] [name] and post hospital discharge follow up related to a diagnosis of COPD and multiple hospital admits in 57month. Patient was evaluated for community based chronic disease management [name] with Craig [name] as a [facility]. Met with the patient at the bedside to explain Craig [name]. Patient endorses his primary care provider to be Craig [name] Patient states he previously had Craig [name] and is agreeable to community care [name]. Patient verbalized he had several high cost medications and would like to be referred to Craig [name] well. Verbal consent obtained. Patient gave [phone]  as the best number to reach him. He also gave verbal permission to call  His sister SDorian Heckleat [phone] he can not be reached. Patient will receive post hospital discharge calls and be evaluated for monthly home visits. Craig [name] with or replace any [name] arranged by the inpatient care management team. RNCM left contact information and Craig [name] at the bedside. Made inpatient RNCM aware [name]. For additional questions please contact:   Craig Ethier RN, [facility]  [phone] Business Mobile [phone] Toll free office

## 2017-03-01 NOTE — Progress Notes (Signed)
[place] at [place] NAME: Craig [name]    MR#:  [id number]  DATE OF BIRTH:  1951-06-25  SUBJECTIVE:   Patient Feeling a bit better this morning as far shortness of breath. Cough is better. Feels a little jittery this morning.  At baseline patient says he does not ambulate very far. REVIEW OF SYSTEMS:    Review of Systems  Constitutional: Negative for fever, chills weight loss HENT: Negative for ear pain, nosebleeds, congestion, facial swelling, rhinorrhea, neck pain, neck stiffness and ear discharge.   Respiratory: Improved cough and shortness of breath and wheezing  Cardiovascular: Negative for chest pain, palpitations and leg swelling.  Gastrointestinal: Negative for heartburn, abdominal pain, vomiting, diarrhea or consitpation Genitourinary: Negative for dysuria, urgency, frequency, hematuria Musculoskeletal: Negative for back pain or joint pain Neurological: Negative for dizziness, seizures, syncope, focal weakness,  numbness and headaches.  Hematological: Does not bruise/bleed easily.  Psychiatric/Behavioral: Negative for hallucinations, confusion, dysphoric mood    Tolerating Diet: yes      DRUG ALLERGIES:   Allergies  Allergen Reactions  . Iodinated Diagnostic Agents Shortness Of Breath and Nausea And Vomiting    13-hour prep  . Nexium [Esomeprazole Magnesium] Other (See Comments)    headache    VITALS:  Blood pressure 128/73, pulse (!) 102, temperature 98.6 F (37 C), temperature source Oral, resp. rate (!) 22, height '5\' 7"'$  (1.702 m), weight 62.5 kg (137 lb 12.8 oz), SpO2 100 %.  PHYSICAL EXAMINATION:  Constitutional: Thin male. No distress. HENT: Normocephalic. Marland Kitchen Oropharynx is clear and moist.  Eyes: Conjunctivae and EOM are normal. PERRLA, no scleral icterus.  Neck: Normal ROM. Neck supple. No JVD. No tracheal deviation. CVS: RRR, S1/S2 +, no murmurs, no gallops, no carotid bruit.  Pulmonary: Effort is normal  with some pursed lip breathing decreased breath sounds throughout. Patient sounds tight with minimal wheezing on expiration  Abdominal: Soft. BS +,  no distension, tenderness, rebound or guarding.  Musculoskeletal: Normal range of motion. No edema and no tenderness.  Neuro: Alert. CN 2-12 grossly intact. No focal deficits. Skin: Skin is warm and dry. No rash noted. Psychiatric: Normal mood and affect.      LABORATORY PANEL:   CBC  Recent Labs Lab 03/01/17 0555  WBC 2.7*  HGB 8.0*  HCT 24.9*  PLT 159   ------------------------------------------------------------------------------------------------------------------  Chemistries   Recent Labs Lab 03/01/17 0555  NA 139  K 4.9  CL 94*  CO2 39*  GLUCOSE 149*  BUN 32*  CREATININE 1.35*  CALCIUM 8.0*   ------------------------------------------------------------------------------------------------------------------  Cardiac Enzymes  Recent Labs Lab 02/28/17 0926  TROPONINI <0.03   ------------------------------------------------------------------------------------------------------------------  RADIOLOGY:  Dg Chest Portable 1 View  Result Date: 02/28/2017 CLINICAL DATA:  Shortness of Breath EXAM: PORTABLE CHEST 1 VIEW COMPARISON:  01/30/2017 FINDINGS: Cardiac shadow is within normal limits. Left-sided chest wall port is again seen in satisfactory position. The lungs are mildly hyperinflated without focal infiltrate or sizable effusion. Multiple healed rib fractures are noted bilaterally. Right upper lobe scarring is again seen. No acute abnormality is noted. IMPRESSION: COPD without acute abnormality. Electronically Signed   By: Inez Catalina M.D.   On: 02/28/2017 09:47     ASSESSMENT AND PLAN:    66 year old male with a history of COPD, chronic respiratory failure on 3 L of oxygen and diabetes who presents with shortness of breath.  1. Acute hypoxic respiratory failure on chronic hypoxic respiratory failure in  the setting of acute COPD  exacerbation Patient is being weaned to his baseline 3 L of oxygen  2. Acute on chronic COPD exacerbation: Continue IV steroids Continue nebs and inhalers  3. Diabetes: Continue sliding scale insulin, ADA diet and Actos  4. Essential hypertension: Continue losartan and HCTZ 5. Acute on chronic anemia: Hemoglobin 8.0 today Will continue to monitor.    Management plans discussed with the patient and he is in agreement.  CODE STATUS: full  TOTAL TIME TAKING CARE OF THIS PATIENT: 30 minutes.     POSSIBLE D/C 1-2 days, DEPENDING ON CLINICAL CONDITION.   Evangelyn Crouse M.D on 03/01/2017 at 10:59 AM  Between 7am to 6pm - Pager - [phone] After 6pm go to [email or web address] - password [email or web address] [facility]  Office  [phone]  CC: Primary care physician; [name], MD  Note: This dictation was prepared with Dragon dictation along with smaller phrase technology. Any transcriptional errors that result from this process are unintentional.

## 2017-03-02 DIAGNOSIS — E43 Unspecified severe protein-calorie malnutrition: Secondary | ICD-10-CM | POA: Insufficient documentation

## 2017-03-02 LAB — GLUCOSE, CAPILLARY: Glucose-Capillary: 115 mg/dL — ABNORMAL HIGH (ref 65–99)

## 2017-03-02 MED ORDER — ENSURE ENLIVE PO LIQD: 237.0000 mL | Freq: Two times a day (BID) | ORAL | 12 refills | Status: AC

## 2017-03-02 MED ORDER — PREDNISONE 50 MG PO TABS: 50.0000 mg | ORAL_TABLET | Freq: Every day | ORAL | 0 refills | Status: AC

## 2017-03-02 MED ORDER — DIPHENHYDRAMINE HCL 25 MG PO TABS: 25.0000 mg | ORAL_TABLET | ORAL | Status: AC | PRN

## 2017-03-02 NOTE — Care Management Note (Signed)
Case Management Note  Patient Details  Name: Craig [name]. MRN: [id number] Date of Birth: 03-01-1951  Subjective/Objective:      [name] is being discharged home today. Has a CPAP at home and is on chronic 3L N/C at home. No other discharge needs identified.               Action/Plan:   Expected Discharge Date:  03/02/17               Expected Discharge Plan:     In-House Referral:     Discharge planning Services     Post Acute Care Choice:    Choice offered to:     DME Arranged:    DME Agency:     HH Arranged:    HH Agency:     Status of Service:     If discussed at [employer], dates discussed:    Additional Comments:  [name], RN 03/02/2017, 9:19 AM

## 2017-03-02 NOTE — Plan of Care (Signed)
Problem: Nutrition: Goal: Adequate nutrition will be maintained Outcome: Not Progressing Less than 1/4 of dinner tray consumed.

## 2017-03-02 NOTE — Discharge Summary (Signed)
[place] at [place] NAME: Craig [name]    MR#:  [id number]  DATE OF BIRTH:  10-12-1951  DATE OF ADMISSION:  02/28/2017 ADMITTING PHYSICIAN: [name], MD  DATE OF DISCHARGE: 03/02/2017  PRIMARY CARE PHYSICIAN: [name], MD    ADMISSION DIAGNOSIS:  COPD exacerbation ([place]) [J44.1] Acute respiratory failure with hypoxia ([place]) [J96.01]  DISCHARGE DIAGNOSIS:  Active Problems:   COPD exacerbation (HCC)   Protein-calorie malnutrition, severe   SECONDARY DIAGNOSIS:   Past Medical History:  Diagnosis Date  . Asthma   . Cancer ([place])   . Colon cancer ([place])   . COPD (chronic obstructive pulmonary disease) ([place])   . Diabetes mellitus without complication ([place])   . Difficulty sleeping   . Dysrhythmia   . GERD (gastroesophageal reflux disease)   . Glaucoma   . History of bronchitis   . Hypertension   . 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 Lymphoma (HCC)    low grade  . Renal cancer ([place])   . Respiratory failure ([place]) 07/12/2010   acute  . Right renal mass   . Shortness of breath dyspnea   . Stroke ([place]) 1990'S   NO RESIDUAL PROBLEMS  . Supplemental oxygen dependent    USES 3 LITERS CONTINUOUSLY    HOSPITAL COURSE:  66 year old male with a history of COPD, chronic respiratory failure on 3 L of oxygen and diabetes who presents with shortness of breath.  1. Acute hypoxic respiratory failure on chronic hypoxic respiratory failure in the setting of acute COPD exacerbation Patient is back to his baseline 3 L of oxygen  2. Acute on chronic COPD exacerbation: He was on IV steroids, nebulizer treatments and inhalers. Physical examination he has mild wheezing with good airflow. His symptoms have much improved. He will be discharged on prednisone 50 mg for 4 days then he may resume his 10 mg daily. He will continue inhaler treatments   3. Diabetes: He may continue outpatient  regimen with ADA diet.   4. Essential hypertension: Continue losartan and HCTZ 5. Acute on chronic anemia: Patient had no signs of bleeding. He was need follow-up CBC by PCP.  DISCHARGE CONDITIONS AND DIET:   Stable for discharge on heart healthy diet  CONSULTS OBTAINED:    DRUG ALLERGIES:   Allergies  Allergen Reactions  . Iodinated Diagnostic Agents Shortness Of Breath and Nausea And Vomiting    13-hour prep  . Nexium [Esomeprazole Magnesium] Other (See Comments)    headache    DISCHARGE MEDICATIONS:   Current Discharge Medication List    START taking these medications   Details  feeding supplement, ENSURE ENLIVE, (ENSURE ENLIVE) LIQD Take 237 mLs by mouth 2 (two) times daily between meals. Qty: 237 mL, Refills: 12      CONTINUE these medications which have CHANGED   Details  diphenhydrAMINE (BENADRYL) 25 MG tablet Take 1 tablet (25 mg total) by mouth as needed for itching or allergies.   Associated Diagnoses: Clear cell renal cell carcinoma, right (HCC)    !! predniSONE (DELTASONE) 50 MG tablet Take 1 tablet (50 mg total) by mouth daily with breakfast. After 50 mg for 4 days then patient may resume 10 mg daily Qty: 4 tablet, Refills: 0     !! - Potential duplicate medications found. Please discuss with provider.    CONTINUE these medications which have NOT CHANGED   Details  Acalabrutinib 100 MG CAPS Take 100  mg by mouth every 12 (twelve) hours. Qty: 60 capsule, Refills: 4   Associated Diagnoses: Lymphoma, small lymphocytic (HCC)    budesonide-formoterol (SYMBICORT) 160-4.5 MCG/ACT inhaler Inhale 2 puffs into the lungs 2 (two) times daily.     losartan-hydrochlorothiazide (HYZAAR) 50-12.5 MG per tablet Take 1 tablet by mouth daily.    pioglitazone (ACTOS) 30 MG tablet Take 30 mg by mouth daily.    !! predniSONE (DELTASONE) 10 MG tablet TAKE 1 TABLET (10 MG TOTAL) BY MOUTH DAILY WITH BREAKFAST. Qty: 30 tablet, Refills: 3    tiotropium (SPIRIVA) 18 MCG  inhalation capsule Place 18 mcg into inhaler and inhale daily.     albuterol (PROVENTIL HFA;VENTOLIN HFA) 108 (90 BASE) MCG/ACT inhaler Inhale 2 puffs into the lungs every 6 (six) hours as needed for wheezing or shortness of breath.     albuterol (PROVENTIL) (2.5 MG/3ML) 0.083% nebulizer solution USE 1 VIAL IN NEBULIZER EVERY 3-4 HOURS AS NEEDED Qty: 75 mL, Refills: 3    dextromethorphan-guaiFENesin (MUCINEX DM) 30-600 MG 12hr tablet Take 1 tablet by mouth 2 (two) times daily. Qty: 16 tablet, Refills: 0    HYDROcodone-acetaminophen (NORCO/VICODIN) 5-325 MG tablet Take 1 tablet by mouth every 8 (eight) hours as needed for moderate pain. Please Note new directions Qty: 90 tablet, Refills: 0   Associated Diagnoses: Malignant neoplasm of left lung, unspecified part of lung ([place]); Renal cell cancer, right ([place]); Lymphoma, small lymphocytic (HCC)    temazepam (RESTORIL) 15 MG capsule Take 15 mg by mouth at bedtime as needed.      !! - Potential duplicate medications found. Please discuss with provider.    STOP taking these medications     levofloxacin (LEVAQUIN) 500 MG tablet      loperamide (IMODIUM A-D) 2 MG tablet           Today   CHIEF COMPLAINT:  No acute events overnight. Patient reports feeling much better. Denies increasing shortness of breath or wheezing.   VITAL SIGNS:  Blood pressure (!) 109/55, pulse 100, temperature 98.6 F (37 C), temperature source Oral, resp. rate (!) 21, height '5\' 7"'$  (1.702 m), weight 63.1 kg (139 lb 3.2 oz), SpO2 96 %.   REVIEW OF SYSTEMS:  Review of Systems  Constitutional: Negative.  Negative for chills, fever and malaise/fatigue.  HENT: Negative.  Negative for ear discharge, ear pain, hearing loss, nosebleeds and sore throat.   Eyes: Negative.  Negative for blurred vision and pain.  Respiratory: Positive for cough. Negative for hemoptysis, shortness of breath (improved) and wheezing.   Cardiovascular: Negative.  Negative for chest pain,  palpitations and leg swelling.  Gastrointestinal: Negative.  Negative for abdominal pain, blood in stool, diarrhea, nausea and vomiting.  Genitourinary: Negative.  Negative for dysuria.  Musculoskeletal: Negative.  Negative for back pain.  Skin: Negative.   Neurological: Negative for dizziness, tremors, speech change, focal weakness, seizures and headaches.  Endo/Heme/Allergies: Negative.  Does not bruise/bleed easily.  Psychiatric/Behavioral: Negative.  Negative for depression, hallucinations and suicidal ideas.     PHYSICAL EXAMINATION:  GENERAL:  66 y.o.-year-old patient lying in the bed with no acute distress.  NECK:  Supple, no jugular venous distention. No thyroid enlargement, no tenderness.  LUNGS:Good airflow with mild expiratory wheezing no rales,rhonchi  No use of accessory muscles of respiration.  CARDIOVASCULAR: S1, S2 normal. No murmurs, rubs, or gallops.  ABDOMEN: Soft, non-tender, non-distended. Bowel sounds present. No organomegaly or mass.  EXTREMITIES: No pedal edema, cyanosis, or clubbing.  PSYCHIATRIC: The patient is  alert and oriented x 3.  SKIN: No obvious rash, lesion, or ulcer.   DATA REVIEW:   CBC  Recent Labs Lab 03/01/17 0555  WBC 2.7*  HGB 8.0*  HCT 24.9*  PLT 159    Chemistries   Recent Labs Lab 03/01/17 0555  NA 139  K 4.9  CL 94*  CO2 39*  GLUCOSE 149*  BUN 32*  CREATININE 1.35*  CALCIUM 8.0*    Cardiac Enzymes  Recent Labs Lab 02/28/17 0926  TROPONINI <0.03    Microbiology Results  '@[id number]'$ @  RADIOLOGY:  Dg Chest Portable 1 View  Result Date: 02/28/2017 CLINICAL DATA:  Shortness of Breath EXAM: PORTABLE CHEST 1 VIEW COMPARISON:  01/30/2017 FINDINGS: Cardiac shadow is within normal limits. Left-sided chest wall port is again seen in satisfactory position. The lungs are mildly hyperinflated without focal infiltrate or sizable effusion. Multiple healed rib fractures are noted bilaterally. Right upper lobe scarring is  again seen. No acute abnormality is noted. IMPRESSION: COPD without acute abnormality. Electronically Signed   By: Inez Catalina M.D.   On: 02/28/2017 09:47      Current Discharge Medication List    START taking these medications   Details  feeding supplement, ENSURE ENLIVE, (ENSURE ENLIVE) LIQD Take 237 mLs by mouth 2 (two) times daily between meals. Qty: 237 mL, Refills: 12      CONTINUE these medications which have CHANGED   Details  diphenhydrAMINE (BENADRYL) 25 MG tablet Take 1 tablet (25 mg total) by mouth as needed for itching or allergies.   Associated Diagnoses: Clear cell renal cell carcinoma, right (HCC)    !! predniSONE (DELTASONE) 50 MG tablet Take 1 tablet (50 mg total) by mouth daily with breakfast. After 50 mg for 4 days then patient may resume 10 mg daily Qty: 4 tablet, Refills: 0     !! - Potential duplicate medications found. Please discuss with provider.    CONTINUE these medications which have NOT CHANGED   Details  Acalabrutinib 100 MG CAPS Take 100 mg by mouth every 12 (twelve) hours. Qty: 60 capsule, Refills: 4   Associated Diagnoses: Lymphoma, small lymphocytic (HCC)    budesonide-formoterol (SYMBICORT) 160-4.5 MCG/ACT inhaler Inhale 2 puffs into the lungs 2 (two) times daily.     losartan-hydrochlorothiazide (HYZAAR) 50-12.5 MG per tablet Take 1 tablet by mouth daily.    pioglitazone (ACTOS) 30 MG tablet Take 30 mg by mouth daily.    !! predniSONE (DELTASONE) 10 MG tablet TAKE 1 TABLET (10 MG TOTAL) BY MOUTH DAILY WITH BREAKFAST. Qty: 30 tablet, Refills: 3    tiotropium (SPIRIVA) 18 MCG inhalation capsule Place 18 mcg into inhaler and inhale daily.     albuterol (PROVENTIL HFA;VENTOLIN HFA) 108 (90 BASE) MCG/ACT inhaler Inhale 2 puffs into the lungs every 6 (six) hours as needed for wheezing or shortness of breath.     albuterol (PROVENTIL) (2.5 MG/3ML) 0.083% nebulizer solution USE 1 VIAL IN NEBULIZER EVERY 3-4 HOURS AS NEEDED Qty: 75 mL, Refills:  3    dextromethorphan-guaiFENesin (MUCINEX DM) 30-600 MG 12hr tablet Take 1 tablet by mouth 2 (two) times daily. Qty: 16 tablet, Refills: 0    HYDROcodone-acetaminophen (NORCO/VICODIN) 5-325 MG tablet Take 1 tablet by mouth every 8 (eight) hours as needed for moderate pain. Please Note new directions Qty: 90 tablet, Refills: 0   Associated Diagnoses: Malignant neoplasm of left lung, unspecified part of lung ([place]); Renal cell cancer, right ([place]); Lymphoma, small lymphocytic (HCC)    temazepam (RESTORIL) 15 MG  capsule Take 15 mg by mouth at bedtime as needed.      !! - Potential duplicate medications found. Please discuss with provider.    STOP taking these medications     levofloxacin (LEVAQUIN) 500 MG tablet      loperamide (IMODIUM A-D) 2 MG tablet           Management plans discussed with the patient and he is in agreement. Stable for discharge home  Patient should follow up with pcp  CODE STATUS:     Code Status Orders        Start     Ordered   02/28/17 1124  Full code  Continuous     02/28/17 1126    Code Status History    Date Active Date Inactive Code Status Order ID Comments User Context   01/30/2017  6:19 PM 02/01/2017  3:17 PM Full Code [id number]  Hillary Bow, MD ED   12/07/2016  4:43 PM 12/10/2016  8:50 PM Full Code [id number]  Bettey Costa, MD Inpatient   07/02/2016  3:01 PM 07/03/2016  3:55 PM DNR [id number]  Hillary Bow, MD ED   05/31/2016  6:48 PM 06/02/2016  3:41 PM DNR [id number]  Nicholes Mango, MD Inpatient   11/19/2015  7:53 PM 11/20/2015  2:12 PM Full Code [id number]  Idelle Crouch, MD Inpatient      TOTAL TIME TAKING CARE OF THIS PATIENT: 37 minutes.    Note: This dictation was prepared with Dragon dictation along with smaller phrase technology. Any transcriptional errors that result from this process are unintentional.  Kennidee Heyne M.D on 03/02/2017 at 7:15 AM  Between 7am to 6pm - Pager - [phone] After 6pm go to [email or web address] - password [email or web address]  [facility]  Office  [phone]  CC: Primary care physician; [name], MD

## 2017-03-02 NOTE — Plan of Care (Signed)
MD making rounds. Received order to discharge home. IV removed. Patient provided with Education Handout. Prescription given to patient. Other prescription E-Scribed to pharmacy. Discharge paperwork provided, explained, signed and witnessed. No questions left unanswered. Discharged via wheelchair by auxiliary staff. Belongings sent with patient and family.

## 2017-03-04 ENCOUNTER — Encounter: Payer: Self-pay | Admitting: Pharmacist

## 2017-03-04 ENCOUNTER — Encounter: Payer: Self-pay | Admitting: *Deleted

## 2017-03-04 ENCOUNTER — Other Ambulatory Visit: Payer: Self-pay | Admitting: *Deleted

## 2017-03-04 ENCOUNTER — Telehealth: Payer: Self-pay

## 2017-03-04 NOTE — Telephone Encounter (Signed)
03/04/17 @ 1316: Phoned 13-hour prep to [facility] in [place] [phone]) and called patient with dosing information as follows:  Prednisone '50mg'$  PO 03/05/17 @ 2100, 03/06/17 @ 0300 and 03/06/17 @ 0900.  He understands to take Benadryl '50mg'$  PO 03/06/17 @ 0900, also and to call with any questions.  Brita Romp, RN

## 2017-03-04 NOTE — Patient Outreach (Signed)
Transition of care call successful, follow up on referral received 5/21 from [place] hospital liaison for community nurse to engage pt for transition of care- recent hospitalization 5/17-5/19 COPD exacerbation,acute respiratory failure, hypoxia.   Spoke with pt, HIPAA/identity verified, discussed purpose of call- follow post hospital discharge.  Pt reports still weak, taking all medications as ordered, do not have discharge papers available at this time- unable to review medications with RN CM.   Pt reports there are no medication changes except Prednisone taper, after completing go back to daily Prednisone dosage. Pt reports respiratory medications helping.   Pt reports waiting to hear back from Primary Care MD office about follow up appointment.  Pt reports appetite getting better, has 2 ensure a day.   RN CM discussed with pt [facility] transition of care program- follow 31 days (weekly phone calls, a home visit).    Plan: As discussed with pt, plan to follow up again next week- initial home visit.          Informed Dr. Lovie Macadamia of [facility] involvement- barrier letter faxed.   [name].   [facility]  [phone]

## 2017-03-06 ENCOUNTER — Ambulatory Visit: Admission: RE | Admit: 2017-03-06 | Payer: Medicare HMO | Source: Ambulatory Visit

## 2017-03-07 ENCOUNTER — Other Ambulatory Visit: Payer: Medicare HMO

## 2017-03-07 ENCOUNTER — Encounter: Payer: Self-pay | Admitting: Internal Medicine

## 2017-03-07 ENCOUNTER — Inpatient Hospital Stay: Payer: Medicare HMO | Attending: Internal Medicine | Admitting: Internal Medicine

## 2017-03-07 VITALS — BP 121/72 | HR 124 | Temp 97.0°F | Wt 131.0 lb

## 2017-03-07 DIAGNOSIS — N183 Chronic kidney disease, stage 3 unspecified: Secondary | ICD-10-CM

## 2017-03-07 DIAGNOSIS — I10 Essential (primary) hypertension: Secondary | ICD-10-CM | POA: Diagnosis not present

## 2017-03-07 DIAGNOSIS — C9110 Chronic lymphocytic leukemia of B-cell type not having achieved remission: Secondary | ICD-10-CM | POA: Diagnosis not present

## 2017-03-07 DIAGNOSIS — K219 Gastro-esophageal reflux disease without esophagitis: Secondary | ICD-10-CM | POA: Insufficient documentation

## 2017-03-07 DIAGNOSIS — E119 Type 2 diabetes mellitus without complications: Secondary | ICD-10-CM | POA: Insufficient documentation

## 2017-03-07 DIAGNOSIS — C3411 Malignant neoplasm of upper lobe, right bronchus or lung: Secondary | ICD-10-CM | POA: Insufficient documentation

## 2017-03-07 DIAGNOSIS — Z8042 Family history of malignant neoplasm of prostate: Secondary | ICD-10-CM | POA: Diagnosis not present

## 2017-03-07 DIAGNOSIS — J9610 Chronic respiratory failure, unspecified whether with hypoxia or hypercapnia: Secondary | ICD-10-CM

## 2017-03-07 DIAGNOSIS — Z85038 Personal history of other malignant neoplasm of large intestine: Secondary | ICD-10-CM | POA: Diagnosis not present

## 2017-03-07 DIAGNOSIS — C641 Malignant neoplasm of right kidney, except renal pelvis: Secondary | ICD-10-CM | POA: Insufficient documentation

## 2017-03-07 DIAGNOSIS — Z79899 Other long term (current) drug therapy: Secondary | ICD-10-CM | POA: Diagnosis not present

## 2017-03-07 DIAGNOSIS — Z9981 Dependence on supplemental oxygen: Secondary | ICD-10-CM | POA: Diagnosis not present

## 2017-03-07 DIAGNOSIS — J449 Chronic obstructive pulmonary disease, unspecified: Secondary | ICD-10-CM | POA: Insufficient documentation

## 2017-03-07 DIAGNOSIS — Z87891 Personal history of nicotine dependence: Secondary | ICD-10-CM | POA: Diagnosis not present

## 2017-03-07 DIAGNOSIS — R079 Chest pain, unspecified: Secondary | ICD-10-CM | POA: Insufficient documentation

## 2017-03-07 DIAGNOSIS — Z809 Family history of malignant neoplasm, unspecified: Secondary | ICD-10-CM | POA: Insufficient documentation

## 2017-03-07 DIAGNOSIS — C3492 Malignant neoplasm of unspecified part of left bronchus or lung: Secondary | ICD-10-CM

## 2017-03-07 DIAGNOSIS — C8300 Small cell B-cell lymphoma, unspecified site: Secondary | ICD-10-CM

## 2017-03-07 MED ORDER — IBRUTINIB 420 MG PO TABS: 420.0000 mg | ORAL_TABLET | Freq: Every day | ORAL | 4 refills | Status: DC

## 2017-03-07 MED ORDER — HYDROCODONE-ACETAMINOPHEN 5-325 MG PO TABS: 1.0000 | ORAL_TABLET | Freq: Three times a day (TID) | ORAL | 0 refills | Status: DC | PRN

## 2017-03-07 NOTE — Progress Notes (Signed)
[place] OFFICE PROGRESS NOTE  Patient Care Team: [name], MD as PCP - General (Family Medicine) Lyman Speller, RN as [place] Management  Cancer Staging Lung cancer, upper lobe [facility]) Staging form: Lung, AJCC 7th Edition - Clinical stage from 06/15/2010: yT2, N2, M0 - Signed by [name], NP on 02/03/2015 - Pathologic stage from 04/04/2014: Maureen Chatters, M1 - Signed by [name], NP on 02/03/2015  Lymphoma, small lymphocytic ([place]) Staging form: Lymphoid Neoplasms, AJCC 6th Edition - Clinical stage from 10/15/2013: Stage II - Signed by [name], NP on 02/03/2015  Renal cell cancer [facility]) Staging form: Kidney, AJCC 7th Edition - Clinical stage from 02/03/2015: Stage I (yT1a, N0, M0) - Signed by [name], NP on 02/03/2015    Oncology History   1. 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   # SEP 2017- GAZYVA x1 [stopped sec to respi issues]  # Nov 1st week- START Ibrutinib 2 pills day; Last Jan 22nd 2018 [sec to co-pay].   # FEB 19th- cycle #1 Bendamustine [day-1& 2-  70mg /m2; Rituxan with cycle #2  # RIGHT KIDNEY CA- RCC [s/p Bx]- s/p Cryo [Dr.Yamagat; IR; 2016]   #  Poor pulmonary function; 3 L home O2.     Lung cancer, upper lobe ([place])   02/03/2015 Initial Diagnosis    Lung cancer, upper lobe       Lymphoma, small lymphocytic ([place])   02/03/2015 Initial Diagnosis    Lymphoma, small lymphocytic (H[facility])      Cancer of lung ([place])   12/21/2015 Initial Diagnosis    Cancer of lung ([place])      Primary cancer of right upper lobe of lung ([place])   06/01/2016 Initial Diagnosis    Primary cancer of right upper lobe of lung (HCC)       INTERVAL HISTORY:  Craig [name]. 66 y.o.  male pleasant patientHistory of chronic respiratory failure on home O2/Severe COPD with above history of Multiple malignancies- most recently with progressive SLL- Currently on surveillance because of poor tolerance to bendamustine; and ibrutinib noted discontinued because of financial issues.  Continues to have chronic shortness of breath is not any worse. Chronic cough. No hemoptysis.  Patient has had multiple visits to the emergency room [facility] admission for COPD. Continues to have chronic chest wall pain- for which is on narcotic pain medication. He denies any weight loss. Denies any night sweats. He denies any new lumps or bumps.  REVIEW OF SYSTEMS:  A complete 10 point review of system is done which is negative except mentioned above/history of present illness.  PAST MEDICAL HISTORY :  Past Medical History:  Diagnosis Date  . Asthma   . Cancer ([place])   . Colon cancer ([place])   . COPD (chronic obstructive pulmonary disease) ([place])   . Diabetes mellitus without complication ([place])   . Difficulty sleeping   . Dysrhythmia   . GERD (gastroesophageal reflux disease)   . Glaucoma   . History of bronchitis   . Hypertension   . Lung cancer ([place])    right upper lobe mass positive for adenocarcinoma  . Lymphoma (H[facility])    low grade  . Renal cancer ([place])   . Respiratory  failure ([place]) 07/12/2010   acute  . Right renal mass   . Shortness of breath dyspnea   . Stroke ([place]) 1990'S   NO RESIDUAL PROBLEMS  . Supplemental oxygen dependent    USES 3 LITERS CONTINUOUSLY    PAST SURGICAL HISTORY :   Past Surgical History:  Procedure Laterality Date  . IR GENERIC HISTORICAL  05/31/2016   IR RADIOLOGIST EVAL & MGMT 05/31/2016 Aletta Edouard, MD [facility] INTERV RAD    FAMILY HISTORY :   Family History  Problem Relation Age of Onset  . Diabetes Mellitus II Mother   . Hypertension Mother   . Cancer Mother   . Heart disease Father   . Hypertension Father   . Tuberculosis Father   . Diabetes Mellitus II Sister   . Cancer Sister   . Prostate cancer Brother     SOCIAL HISTORY:   Social History  Substance Use Topics  . Smoking status: Former Smoker    Packs/day: 1.00    Years: 30.00    Types: Cigarettes    Start date: 12/28/1968    Quit date: 01/13/2010  . Smokeless tobacco: Never Used  . Alcohol use No     Comment: stopped 2002     ALLERGIES:  is allergic to iodinated diagnostic agents and nexium [esomeprazole magnesium].  MEDICATIONS:  Current Outpatient Prescriptions  Medication Sig Dispense Refill  . albuterol (PROVENTIL HFA;VENTOLIN HFA) 108 (90 BASE) MCG/ACT inhaler Inhale 2 puffs into the lungs every 6 (six) hours as needed for wheezing or shortness of breath.     Marland Kitchen albuterol (PROVENTIL) (2.5 MG/3ML) 0.083% nebulizer solution USE 1 VIAL IN NEBULIZER EVERY 3-4 HOURS AS NEEDED 75 mL 3  . budesonide-formoterol (SYMBICORT) 160-4.5 MCG/ACT inhaler Inhale 2 puffs into the lungs 2 (two) times daily.     Marland Kitchen dextromethorphan-guaiFENesin (MUCINEX DM) 30-600 MG 12hr tablet Take 1 tablet by mouth 2 (two) times daily. 16 tablet 0  . diphenhydrAMINE (BENADRYL) 25 MG tablet Take 1 tablet (25 mg total) by mouth as needed for itching or allergies.    . feeding supplement, ENSURE ENLIVE, (ENSURE ENLIVE) LIQD Take 237 mLs by mouth 2 (two) times daily between meals.  237 mL 12  . HYDROcodone-acetaminophen (NORCO/VICODIN) 5-325 MG tablet Take 1 tablet by mouth every 8 (eight) hours as needed for moderate pain. Please Note new directions 90 tablet 0  . losartan-hydrochlorothiazide (HYZAAR) 50-12.5 MG per tablet Take 1 tablet by mouth daily.    . pioglitazone (ACTOS) 30 MG tablet Take 30 mg by mouth daily.    . predniSONE (DELTASONE) 10 MG tablet TAKE 1 TABLET (10 MG TOTAL) BY MOUTH DAILY WITH BREAKFAST. 30 tablet 3  . temazepam (RESTORIL) 15 MG capsule Take 15 mg by mouth at bedtime as needed.     . tiotropium (SPIRIVA) 18 MCG inhalation capsule Place 18 mcg into inhaler and inhale daily.     Marland Kitchen  Ibrutinib 420 MG TABS Take 420 mg by mouth daily. 30 tablet 4   No current facility-administered medications for this visit.    Facility-Administered Medications Ordered in Other Visits  Medication Dose Route Frequency Provider Last Rate Last Dose  . sodium chloride 0.9 % injection 10 mL  10 mL Intracatheter PRN Forest Gleason, MD   10 mL at 04/28/15 1410  . sodium chloride 0.9 % injection 10 mL  10 mL Intravenous PRN Forest Gleason, MD   10 mL at 05/12/15 1350    PHYSICAL EXAMINATION: ECOG PERFORMANCE STATUS: 2 - Symptomatic, <50% confined to bed  BP 121/72 (BP Location: Left Arm, Patient Position: Sitting)   Pulse (!) 124   Temp 97 F (36.1 C) (Tympanic)   Wt 131 lb (59.4 kg)   BMI 20.52 kg/m   Filed Weights   03/07/17 1014  Weight: 131 lb (59.4 kg)    GENERAL: Well-nourished well-developed; Alert, no distress and comfortable.   Accompanied by his sister. He is on home oxygen. He is in a wheelchair.  EYES: no pallor or icterus OROPHARYNX: no thrush or ulceration; good dentition  NECK: supple, no masses felt LYMPH:  no palpable lymphadenopathy in the cervical, axillary or inguinal regions LUNGS:Bilateral decreased air entry to auscultationn and  No wheeze or crackles HEART/CVS: regular rate & rhythm and no murmurs; No lower extremity  edema ABDOMEN:abdomen soft, non-tender and normal bowel sounds Musculoskeletal:no cyanosis of digits and no clubbing  PSYCH: alert & oriented x 3 with fluent speech NEURO: no focal motor/sensory deficits SKIN:  no rashes or significant lesions  LABORATORY DATA:  I have reviewed the data as listed    Component Value Date/Time   NA 139 03/01/2017 0555   NA 139 01/17/2015 0913   K 4.9 03/01/2017 0555   K 3.8 01/17/2015 0913   CL 94 (L) 03/01/2017 0555   CL 99 (L) 01/17/2015 0913   CO2 39 (H) 03/01/2017 0555   CO2 35 (H) 01/17/2015 0913   GLUCOSE 149 (H) 03/01/2017 0555   GLUCOSE 169 (H) 01/17/2015 0913   BUN 32 (H) 03/01/2017 0555   BUN 15 01/17/2015 0913   CREATININE 1.35 (H) 03/01/2017 0555   CREATININE 1.23 01/17/2015 0913   CALCIUM 8.0 (L) 03/01/2017 0555   CALCIUM 8.8 (L) 01/17/2015 0913   PROT 6.9 01/30/2017 1405   PROT 6.6 01/17/2015 0913   ALBUMIN 4.1 01/30/2017 1405   ALBUMIN 3.7 01/17/2015 0913   AST 17 01/30/2017 1405   AST 20 01/17/2015 0913   ALT 15 (L) 01/30/2017 1405   ALT 15 (L) 01/17/2015 0913   ALKPHOS 76 01/30/2017 1405   ALKPHOS 76 01/17/2015 0913   BILITOT 0.4 01/30/2017 1405   BILITOT 0.5 01/17/2015 0913   GFRNONAA 53 (L) 03/01/2017 0555   GFRNONAA >60 01/17/2015 0913   GFRAA >60 03/01/2017 0555   GFRAA >60 01/17/2015 0913    No results found for: SPEP, UPEP  Lab Results  Component Value Date   WBC 2.7 (L) 03/01/2017   NEUTROABS 5.3 01/30/2017   HGB 8.0 (L) 03/01/2017   HCT 24.9 (L) 03/01/2017   MCV 86.5 03/01/2017   PLT 159 03/01/2017      Chemistry      Component Value Date/Time   NA 139 03/01/2017 0555   NA 139 01/17/2015 0913   K 4.9 03/01/2017 0555   K 3.8 01/17/2015 0913   CL 94 (L) 03/01/2017 0555   CL 99 (L) 01/17/2015 0913   CO2 39 (H)  03/01/2017 0555   CO2 35 (H) 01/17/2015 0913   BUN 32 (H) 03/01/2017 0555   BUN 15 01/17/2015 0913   CREATININE 1.35 (H) 03/01/2017 0555   CREATININE 1.23 01/17/2015 0913       Component Value Date/Time   CALCIUM 8.0 (L) 03/01/2017 0555   CALCIUM 8.8 (L) 01/17/2015 0913   ALKPHOS 76 01/30/2017 1405   ALKPHOS 76 01/17/2015 0913   AST 17 01/30/2017 1405   AST 20 01/17/2015 0913   ALT 15 (L) 01/30/2017 1405   ALT 15 (L) 01/17/2015 0913   BILITOT 0.4 01/30/2017 1405   BILITOT 0.5 01/17/2015 0913       RADIOGRAPHIC STUDIES: I have personally reviewed the radiological images as listed and agreed with the findings in the report. No results found.   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 Patient unable to tolerate Rituxan; bendamustine. Previously tolerated ibrutinib- however currently discontinued because of financial considerations. Would reinitiate a prescription for ibrutinib-and check if patient can avail the drug. We will check with social worker Jack/ for financial assistance.  # Advanced COPD-stable/multiple hospitalizations. Patient's pulse ox on 4 L 80%%. Currently stable.   # Pain- chest when coughing- continue pain meds prn. New script given.   # follow up with me in 6 weeks/labs; PET scan few days prior. The difficult situation was discussed with the patient and his sister in detail. They agree.  Cc; Elease Etienne.    Orders Placed This Encounter  Procedures  . NM PET Image Restag (PS) Skull Base To Thigh    Standing Status:   Future    Standing Expiration Date:   05/07/2018    Order Specific Question:   Reason for Exam (SYMPTOM  OR DIAGNOSIS REQUIRED)    Answer:   lymphoma    Order Specific Question:   Preferred imaging location?    Answer:   [facility]  . Lactate dehydrogenase    Standing Status:   Future    Standing Expiration Date:   03/07/2018  . Comprehensive metabolic panel    Standing Status:   Future    Standing Expiration Date:   03/07/2018  . CBC with Differential/Platelet    Standing Status:   Future    Standing  Expiration Date:   03/07/2018      [name], MD 03/11/2017 3:50 PM

## 2017-03-07 NOTE — Assessment & Plan Note (Addendum)
#   Small lymphocytic lymphoma-AUG  2018 CT- bulky adenopathy in the retroperitoneum progression. Patient unable to tolerate Rituxan; bendamustine. Previously tolerated ibrutinib- however currently discontinued because of financial considerations. Would reinitiate a prescription for ibrutinib-and check if patient can avail the drug. We will check with social worker Jack/ for financial assistance.  # Advanced COPD-stable/multiple hospitalizations. Patient's pulse ox on 4 L 80%%. Currently stable.   # Pain- chest when coughing- continue pain meds prn. New script given.   # follow up with me in 6 weeks/labs; PET scan few days prior. The difficult situation was discussed with the patient and his sister in detail. They agree.  Cc; Elease Etienne.

## 2017-03-07 NOTE — Progress Notes (Signed)
Patient here for follow up patient was hospitalized since last appointment.

## 2017-03-12 ENCOUNTER — Other Ambulatory Visit: Payer: Self-pay | Admitting: Internal Medicine

## 2017-03-12 ENCOUNTER — Inpatient Hospital Stay: Admission: RE | Admit: 2017-03-12 | Payer: Medicare HMO | Source: Ambulatory Visit

## 2017-03-12 DIAGNOSIS — N189 Chronic kidney disease, unspecified: Secondary | ICD-10-CM | POA: Diagnosis not present

## 2017-03-12 DIAGNOSIS — J449 Chronic obstructive pulmonary disease, unspecified: Secondary | ICD-10-CM | POA: Diagnosis not present

## 2017-03-12 DIAGNOSIS — C8300 Small cell B-cell lymphoma, unspecified site: Secondary | ICD-10-CM | POA: Diagnosis not present

## 2017-03-12 DIAGNOSIS — M25532 Pain in left wrist: Secondary | ICD-10-CM | POA: Diagnosis not present

## 2017-03-12 DIAGNOSIS — I129 Hypertensive chronic kidney disease with stage 1 through stage 4 chronic kidney disease, or unspecified chronic kidney disease: Secondary | ICD-10-CM | POA: Diagnosis not present

## 2017-03-12 DIAGNOSIS — C649 Malignant neoplasm of unspecified kidney, except renal pelvis: Secondary | ICD-10-CM | POA: Diagnosis not present

## 2017-03-12 DIAGNOSIS — C3410 Malignant neoplasm of upper lobe, unspecified bronchus or lung: Secondary | ICD-10-CM | POA: Diagnosis not present

## 2017-03-12 DIAGNOSIS — E1122 Type 2 diabetes mellitus with diabetic chronic kidney disease: Secondary | ICD-10-CM | POA: Diagnosis not present

## 2017-03-12 DIAGNOSIS — M6281 Muscle weakness (generalized): Secondary | ICD-10-CM | POA: Diagnosis not present

## 2017-03-12 NOTE — Progress Notes (Unsigned)
  Pts pharmacy has requested refill of Prednisone 50 mg #3  Rx was called to [facility] [id number] 03/04/17 For procedure scheduled for 03/06/17  Apparently CT Abd with Cx was not performed as pt did not show for imaging. Now pharmacy is calling for refill of meds  I have called pt to discuss needs of medication. NA Left message to call [phone]

## 2017-03-13 ENCOUNTER — Other Ambulatory Visit: Payer: Self-pay | Admitting: *Deleted

## 2017-03-13 MED ORDER — PREDNISONE 10 MG PO TABS: ORAL_TABLET | ORAL | 3 refills | Status: DC

## 2017-03-14 ENCOUNTER — Encounter: Payer: Self-pay | Admitting: *Deleted

## 2017-03-14 ENCOUNTER — Ambulatory Visit: Payer: Medicare HMO | Admitting: Internal Medicine

## 2017-03-14 ENCOUNTER — Other Ambulatory Visit: Payer: Self-pay | Admitting: *Deleted

## 2017-03-14 ENCOUNTER — Other Ambulatory Visit: Payer: Self-pay | Admitting: Pharmacist

## 2017-03-14 NOTE — Patient Outreach (Signed)
[facility]) Care Management   03/14/2017  Craig [name]. 25-Nov-1950 [id number]  Craig [name]. is an 65 y.o. male  Subjective: Pt reports 2 days ago called Lung MD to report color in  Mucous, antibiotic called in, feeling better today. Pt reports he has been Doing the flutter valve.  Pt reports to have new chemo treatment in July for lymphoma, previous chemo drug did not work. Pt reports appetite has improved last couple days, having one ensure a day.  Pt reports sugars range 110.    Objective:  Vitals:   03/14/17 1515  BP: 112/70  Pulse: (!) 102  Resp: 20      ROS  Physical Exam  Constitutional: He is oriented to person, place, and time. He appears well-developed and well-nourished.  Cardiovascular: Regular rhythm and normal heart sounds.   HR 102 today   Respiratory: Effort normal.  Congestion auscultated in all fields upon expiration.  Pt coughing during home visit.    GI: Soft. Bowel sounds are normal.  Musculoskeletal: Normal range of motion. He exhibits no edema.  Neurological: He is alert and oriented to person, place, and time.  Skin: Skin is warm and dry.  Psychiatric: He has a normal mood and affect. His behavior is normal. Judgment and thought content normal.    Encounter Medications:   Outpatient Encounter Prescriptions as of 03/14/2017  Medication Sig  . albuterol (PROVENTIL HFA;VENTOLIN HFA) 108 (90 BASE) MCG/ACT inhaler Inhale 2 puffs into the lungs every 6 (six) hours as needed for wheezing or shortness of breath.   Marland Kitchen albuterol (PROVENTIL) (2.5 MG/3ML) 0.083% nebulizer solution USE 1 VIAL IN NEBULIZER EVERY 3-4 HOURS AS NEEDED  . budesonide-formoterol (SYMBICORT) 160-4.5 MCG/ACT inhaler Inhale 2 puffs into the lungs 2 (two) times daily.   Marland Kitchen dextromethorphan-guaiFENesin (MUCINEX DM) 30-600 MG 12hr tablet Take 1 tablet by mouth 2 (two) times daily.  . diphenhydrAMINE (BENADRYL) 25 MG tablet Take 1 tablet (25 mg total) by mouth as needed  for itching or allergies.  . feeding supplement, ENSURE ENLIVE, (ENSURE ENLIVE) LIQD Take 237 mLs by mouth 2 (two) times daily between meals.  Marland Kitchen HYDROcodone-acetaminophen (NORCO/VICODIN) 5-325 MG tablet Take 1 tablet by mouth every 8 (eight) hours as needed for moderate pain. Please Note new directions  . Ibrutinib 420 MG TABS Take 420 mg by mouth daily.  Marland Kitchen losartan-hydrochlorothiazide (HYZAAR) 50-12.5 MG per tablet Take 1 tablet by mouth daily.  . pioglitazone (ACTOS) 30 MG tablet Take 30 mg by mouth daily.  . predniSONE (DELTASONE) 10 MG tablet TAKE 1 TABLET (10 MG TOTAL) BY MOUTH DAILY WITH BREAKFAST.  Marland Kitchen temazepam (RESTORIL) 15 MG capsule Take 15 mg by mouth at bedtime as needed.   . tiotropium (SPIRIVA) 18 MCG inhalation capsule Place 18 mcg into inhaler and inhale daily.    Facility-Administered Encounter Medications as of 03/14/2017  Medication  . sodium chloride 0.9 % injection 10 mL  . sodium chloride 0.9 % injection 10 mL    Functional Status:   In your present state of health, do you have any difficulty performing the following activities: 03/14/2017 02/28/2017  Hearing? N N  Vision? N N  Difficulty concentrating or making decisions? N N  Walking or climbing stairs? Y Y  Dressing or bathing? Y Y  Doing errands, shopping? [name]  Preparing Food and eating ? Y -  Using the Toilet? N -  In the past six months, have you accidently leaked urine? N -  Do you have problems with loss of bowel control? N -  Managing your Medications? N -  Managing your Finances? N -  Housekeeping or managing your Housekeeping? N -  Some recent data might be hidden    Fall/Depression Screening:    Fall Risk  03/14/2017 02/05/2017 12/04/2016  Falls in the past year? No No No   PHQ 2/9 Scores 03/14/2017 02/05/2017 07/20/2016  PHQ - 2 Score 0 0 0    Assessment:  Pleasant 66 year old gentleman, receives assistance as needed  from friend who resides with him.   COPD:  Congestion noted in all fields upon  expiration.  O 2 sat at rest- 96%   on 3 liters [place].  Coughing during home visit, started antibiotic 2 days ago.   Feeling better. Reviewed ongoing adherence with antibiotic, neb    Treatments, flutter valve use, hydration.  Appetite: per pt improving, taking one Ensure a day.     Plan: RN CM to continue to follow pt for transition of care, as discussed with pt coworker covering for this RN CM to follow up again next week  Telephonically.            Plan to fax Dr. Lovie Macadamia 5/31 home visit encounter.   [facility] CM Care Plan Problem One     Most Recent Value  Care Plan Problem One  At risk for readmission related to recent hospitalization for COPD exacerbation,, ARF with hypoxia   Role Documenting the Problem One  Care Management Coordinator  Care Plan for Problem One  Active  [facility] Long Term Goal   Pt would not readmit to the hospital in the next 31 days   [facility] Long Term Goal Start Date  03/04/17  Interventions for Problem One Long Term Goal  home visit done- reinforced with pt with current congestion- complete antibiotic, neb treatments,flutter valve use,hydration  [visit done 03/14/17 ]  [facility] CM Short Term Goal #1   Pt would keep all MD appointments in the next 30 days   [facility] CM Short Term Goal #1 Start Date  03/04/17  Interventions for Short Term Goal #1  reviewed with pt if congestion gets worse to call MD   [facility] CM Short Term Goal #2   Pt's appetite will improve in the next 30 days   [facility] CM Short Term Goal #2 Start Date  03/04/17  Interventions for Short Term Goal #2  discussed with pt current appetite- per pt last couple days improving.  [discussed during 5/31 h/v ]

## 2017-03-14 NOTE — Progress Notes (Signed)
Ibrutinib rx faxed to [facility] 03/14/17.

## 2017-03-14 NOTE — Patient Outreach (Signed)
[facility]) Care Management  [place]   03/14/2017  Qais Jowers. [birth year] [id number]  Subjective:  Patient was referred to [place] for medication assistance evaluation by [facility] [place].    Successful phone outreach to patient, purpose of call explained to patient.  He was willing to review his medications over the phone and reports difficulty affording Spiriva and Symbicort.  He also reports he has looked into assistance from multiple foundations for help with his oral oncology medication cost but reports not finding help.    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    Patient was admitted at [facility] 5/17-5/19/18 for COPD exacerbation.    Patient reports he paid >$400 each for 90 day supplies of Symbicort and Spiriva.    Objective:   Current Medications: Current Outpatient Prescriptions  Medication Sig Dispense Refill  . albuterol (PROVENTIL HFA;VENTOLIN HFA) 108 (90 BASE) MCG/ACT inhaler Inhale 2 puffs into the lungs every 6 (six) hours as needed for wheezing or shortness of breath.     Marland Kitchen albuterol (PROVENTIL) (2.5 MG/3ML) 0.083% nebulizer solution USE 1 VIAL IN NEBULIZER EVERY 3-4 HOURS AS NEEDED 75 mL 3  . azithromycin (ZITHROMAX) 250 MG tablet Take by mouth daily. 2 tablets first day, then 1 tablet daily for 4 days    . budesonide-formoterol (SYMBICORT) 160-4.5 MCG/ACT inhaler Inhale 2 puffs into the lungs 2 (two) times daily.     Marland Kitchen dextromethorphan-guaiFENesin (MUCINEX DM) 30-600 MG 12hr tablet Take 1 tablet by mouth 2 (two) times daily. (Patient not taking: Reported on 03/14/2017) 16 tablet 0  . diphenhydrAMINE (BENADRYL) 25 MG tablet Take 1 tablet (25 mg total) by mouth as needed for itching or allergies.    . feeding supplement, ENSURE ENLIVE, (ENSURE ENLIVE) LIQD Take 237 mLs by  mouth 2 (two) times daily between meals. 237 mL 12  . HYDROcodone-acetaminophen (NORCO/VICODIN) 5-325 MG tablet Take 1 tablet by mouth every 8 (eight) hours as needed for moderate pain. Please Note new directions 90 tablet 0  . Ibrutinib 420 MG TABS Take 420 mg by mouth daily. (Patient not taking: Reported on 03/14/2017) 30 tablet 4  . losartan-hydrochlorothiazide (HYZAAR) 50-12.5 MG per tablet Take 1 tablet by mouth daily.    . pioglitazone (ACTOS) 30 MG tablet Take 30 mg by mouth daily.    . predniSONE (DELTASONE) 10 MG tablet TAKE 1 TABLET (10 MG TOTAL) BY MOUTH DAILY WITH BREAKFAST. 30 tablet 3  . temazepam (RESTORIL) 15 MG capsule Take 15 mg by mouth at bedtime as needed.     . tiotropium (SPIRIVA) 18 MCG inhalation capsule Place 18 mcg into inhaler and inhale daily.      No current facility-administered medications for this visit.    Facility-Administered Medications Ordered in Other Visits  Medication Dose Route Frequency Provider Last Rate Last Dose  . sodium chloride 0.9 % injection 10 mL  10 mL Intracatheter PRN Choksi, Janak, MD   10 mL at 04/28/15 1410  . sodium chloride 0.9 % injection 10 mL  10 mL Intravenous PRN Forest Gleason, MD   10 mL at 05/12/15 1350    Functional Status: In your present state of health, do you have any difficulty performing the following activities: 03/14/2017 02/28/2017  Hearing? N N  Vision? N N  Difficulty concentrating or making decisions? N N  Walking or climbing  stairs? Y Y  Dressing or bathing? Y Y  Doing errands, shopping? [name]  Preparing Food and eating ? Y -  Using the Toilet? N -  In the past six months, have you accidently leaked urine? N -  Do you have problems with loss of bowel control? N -  Managing your Medications? N -  Managing your Finances? N -  Housekeeping or managing your Housekeeping? N -  Some recent data might be hidden    Fall/Depression Screening: Fall Risk  03/14/2017 02/05/2017 12/04/2016  Falls in the past year? No No  No   PHQ 2/9 Scores 03/14/2017 02/05/2017 07/20/2016  PHQ - 2 Score 0 0 0    Assessment:   Medication review per patient report and review of medication list in this chart and 03/02/17 discharge list. Patient was recently discharged from hospital and all medications have been reviewed.    Drugs sorted by system:  Cardiovascular: -losartan/hydrochlorothiazide  Pulmonary/Allergy: -albuterol nebs -albuterol inhaler -Symbicort  -Spiriva   Gastrointestinal: -patient reports uses OTC anti-diarrheal as needed---he reports it is [employer] store brand and he isn't sure of name ---he was counseled to double check name as loperamide was listed as a discontinued medication on his discharge medication list  Endocrine: -pioglitazone  Pain: -hydrocodone/acetaminophen  Infectious Diseases: -patient reports pulmonologist sent in course of azithromycin on 03/13/17.    Miscellaneous: -diphenhydramine as needed  -prednisone  -temazepam as needed  Medications to avoid in the elderly:  -diphenhydramine---increased risk of anti-cholinergic effects   Other issues: -dextromethorphan/guaifenesin is on medication list---he reports not using -ibrutinib (Imbruvica) is on medication list---he reports not using secondary to cost  Medication assistance:    Discussed Social Security Administration Extra Help---patient reports income exceed requirements.   Discussed [place] Patient Assistance Program (Symbicort) and out-of-pocket spend requirement on prescriptions and income requirement.   Discussed [place] (Spiriva)---patient may need to appeal if his income exceeds requirements.   Placed call to [employer] Patient [place] (Imbruvica---ibrutinib) and was told Medicare Part D beneficiaries must provide proof of spending at least 4% out-of-pocket on prescriptions in current calendar year.  Called patient back and explained this to him.    Patient reports  he is not sure how much he has paid year-to-date---he was counseled he will need to provide proof of out-of-pocket prescription spend such as monthly explanation of benefit statement from insurance company as well as proof of total annual gross household income.    Patient is interested in applying for patient assistance---he was counseled there is of course no guarantee he will be eligible.    Plan:  Will send patient applications for [place] Patient Assistance, [place], and [employer] Patient [employer].   Will route this note to PCP.   Will follow-up with patient via phone in the next 2 weeks.    Karrie Meres, PharmD, [place] [phone]

## 2017-03-15 ENCOUNTER — Encounter: Payer: Self-pay | Admitting: Pharmacist

## 2017-03-18 ENCOUNTER — Encounter: Payer: Self-pay | Admitting: *Deleted

## 2017-03-18 NOTE — Progress Notes (Signed)
Received notification from [facility] that pt's rx for imbruvica was approval by insurance. auth good until 03/15/2018

## 2017-03-21 ENCOUNTER — Encounter: Payer: Self-pay | Admitting: *Deleted

## 2017-03-21 ENCOUNTER — Other Ambulatory Visit: Payer: Self-pay | Admitting: *Deleted

## 2017-03-21 DIAGNOSIS — C8300 Small cell B-cell lymphoma, unspecified site: Secondary | ICD-10-CM | POA: Diagnosis not present

## 2017-03-21 DIAGNOSIS — N189 Chronic kidney disease, unspecified: Secondary | ICD-10-CM | POA: Diagnosis not present

## 2017-03-21 DIAGNOSIS — C3410 Malignant neoplasm of upper lobe, unspecified bronchus or lung: Secondary | ICD-10-CM | POA: Diagnosis not present

## 2017-03-21 DIAGNOSIS — E1122 Type 2 diabetes mellitus with diabetic chronic kidney disease: Secondary | ICD-10-CM | POA: Diagnosis not present

## 2017-03-21 DIAGNOSIS — C649 Malignant neoplasm of unspecified kidney, except renal pelvis: Secondary | ICD-10-CM | POA: Diagnosis not present

## 2017-03-21 DIAGNOSIS — M25532 Pain in left wrist: Secondary | ICD-10-CM | POA: Diagnosis not present

## 2017-03-21 DIAGNOSIS — M6281 Muscle weakness (generalized): Secondary | ICD-10-CM | POA: Diagnosis not present

## 2017-03-21 DIAGNOSIS — J449 Chronic obstructive pulmonary disease, unspecified: Secondary | ICD-10-CM | POA: Diagnosis not present

## 2017-03-21 DIAGNOSIS — I129 Hypertensive chronic kidney disease with stage 1 through stage 4 chronic kidney disease, or unspecified chronic kidney disease: Secondary | ICD-10-CM | POA: Diagnosis not present

## 2017-03-21 NOTE — Patient Outreach (Signed)
[facility]) Care Management [place] Telephone Outreach, Transition of Care  03/21/2017  Craig [name]. 02-20-1951 [id number]   Successful telephone outreach to [name], 66 y/o male followed by [place] for transition of care after recent hospitalization for COPD, Acute respiratory failure and hypoxia.  HIPAA/ Identity verified with patient during phone call today.  Today, patient reports that he "is doing very good," and denies concerns, problems or issues; patient sounded to be in no obvious distress throughout today's phone call.  -- Reports "cough is better," "feels better," "breathing pretty good;"  Continues to wear O2 at 3 L/min via [place] "all the time."  -- Completed recent round of antibiotics, reports took all as prescribed.  States he was put on a "new cancer medicine," but states he can not remember the name of it, as he is not physically near the medication to read it; reports that he has been taking this new medication as prescribed, but that it is causing him to have diarrhea, which he reports is an "expected side effect."  Encouraged patient to communicate this side effect to oncology provider, and he verbalizes understanding and agreement, stating that "it isn't too bad, and they told me to expect it."  Otherwise reports that he has and is taking all medications as prescribed, and denies questions/ concerns around medications today.  -- HH PT visited patient "today."  Reports PT session "went just fine."  Patient denies further issues, concerns, or problems today.  I explained that I was contacting patient for primary [facility] RN CM Kathie Rhodes, and that Rose would contact patient again next week for ongoing transition of care; patient verbalized understanding and agreement with this plan.  Plan:  Will update patient's primary [facility] RN CM of today's successful telephone outreach to patient  Oneta Rack, RN, BSN, [place] Coordinator [facility] Care Management  [phone]

## 2017-03-26 ENCOUNTER — Other Ambulatory Visit: Payer: Self-pay | Admitting: Pharmacist

## 2017-03-26 ENCOUNTER — Encounter: Payer: Self-pay | Admitting: Pharmacist

## 2017-03-26 DIAGNOSIS — C3410 Malignant neoplasm of upper lobe, unspecified bronchus or lung: Secondary | ICD-10-CM | POA: Diagnosis not present

## 2017-03-26 DIAGNOSIS — I129 Hypertensive chronic kidney disease with stage 1 through stage 4 chronic kidney disease, or unspecified chronic kidney disease: Secondary | ICD-10-CM | POA: Diagnosis not present

## 2017-03-26 DIAGNOSIS — N189 Chronic kidney disease, unspecified: Secondary | ICD-10-CM | POA: Diagnosis not present

## 2017-03-26 DIAGNOSIS — C8300 Small cell B-cell lymphoma, unspecified site: Secondary | ICD-10-CM | POA: Diagnosis not present

## 2017-03-26 DIAGNOSIS — M6281 Muscle weakness (generalized): Secondary | ICD-10-CM | POA: Diagnosis not present

## 2017-03-26 DIAGNOSIS — E1122 Type 2 diabetes mellitus with diabetic chronic kidney disease: Secondary | ICD-10-CM | POA: Diagnosis not present

## 2017-03-26 DIAGNOSIS — J449 Chronic obstructive pulmonary disease, unspecified: Secondary | ICD-10-CM | POA: Diagnosis not present

## 2017-03-26 DIAGNOSIS — C649 Malignant neoplasm of unspecified kidney, except renal pelvis: Secondary | ICD-10-CM | POA: Diagnosis not present

## 2017-03-26 DIAGNOSIS — M25532 Pain in left wrist: Secondary | ICD-10-CM | POA: Diagnosis not present

## 2017-03-26 NOTE — Patient Outreach (Signed)
[facility]) Care Management  03/26/2017  [place] 10/14/1951 [id number]  Follow-up call to patient regarding patient assistance applications.  Patient reports he received application in the mail yesterday and he has not yet looked through them. He reports he believes he is in the catastrophic coverage phase of his Part D benefit.   He reports he plans to review applications and still complete to see if he is able to get any assistance with medication cost.   Plan:  Outreach phone attempt to patient next week.   Karrie Meres, PharmD, [place] [phone]

## 2017-03-29 ENCOUNTER — Other Ambulatory Visit: Payer: Self-pay | Admitting: *Deleted

## 2017-03-29 DIAGNOSIS — C8300 Small cell B-cell lymphoma, unspecified site: Secondary | ICD-10-CM | POA: Diagnosis not present

## 2017-03-29 DIAGNOSIS — E1122 Type 2 diabetes mellitus with diabetic chronic kidney disease: Secondary | ICD-10-CM | POA: Diagnosis not present

## 2017-03-29 DIAGNOSIS — M25532 Pain in left wrist: Secondary | ICD-10-CM | POA: Diagnosis not present

## 2017-03-29 DIAGNOSIS — J449 Chronic obstructive pulmonary disease, unspecified: Secondary | ICD-10-CM | POA: Diagnosis not present

## 2017-03-29 DIAGNOSIS — M6281 Muscle weakness (generalized): Secondary | ICD-10-CM | POA: Diagnosis not present

## 2017-03-29 DIAGNOSIS — C649 Malignant neoplasm of unspecified kidney, except renal pelvis: Secondary | ICD-10-CM | POA: Diagnosis not present

## 2017-03-29 DIAGNOSIS — N189 Chronic kidney disease, unspecified: Secondary | ICD-10-CM | POA: Diagnosis not present

## 2017-03-29 DIAGNOSIS — C3410 Malignant neoplasm of upper lobe, unspecified bronchus or lung: Secondary | ICD-10-CM | POA: Diagnosis not present

## 2017-03-29 DIAGNOSIS — I129 Hypertensive chronic kidney disease with stage 1 through stage 4 chronic kidney disease, or unspecified chronic kidney disease: Secondary | ICD-10-CM | POA: Diagnosis not present

## 2017-03-29 NOTE — Patient Outreach (Signed)
Transition of care call completed, ongoing follow up on recent hospitalization 5/17-5/19 for COPD exacerbation,acute respiratory failure, hypoxia.   Spoke with pt, HIPAA/identity verified. Pt reports cough a lot better, has no cough, no increased sob.   Pt reports on new chemo medication started- has a little diarrhea (side effect) able to manage.  Pt reports no upcoming MD appointments, to see Oncologist in July.   Pt reports appetite is good.     Plan:  As discussed with pt, plan to follow up again next week telephonically for  final transition fo care call.    [name].   [place] Care Management  [phone]

## 2017-04-02 ENCOUNTER — Other Ambulatory Visit: Payer: Self-pay | Admitting: *Deleted

## 2017-04-02 DIAGNOSIS — M6281 Muscle weakness (generalized): Secondary | ICD-10-CM | POA: Diagnosis not present

## 2017-04-02 DIAGNOSIS — J449 Chronic obstructive pulmonary disease, unspecified: Secondary | ICD-10-CM | POA: Diagnosis not present

## 2017-04-02 DIAGNOSIS — M25532 Pain in left wrist: Secondary | ICD-10-CM | POA: Diagnosis not present

## 2017-04-02 DIAGNOSIS — C3492 Malignant neoplasm of unspecified part of left bronchus or lung: Secondary | ICD-10-CM

## 2017-04-02 DIAGNOSIS — N189 Chronic kidney disease, unspecified: Secondary | ICD-10-CM | POA: Diagnosis not present

## 2017-04-02 DIAGNOSIS — C3410 Malignant neoplasm of upper lobe, unspecified bronchus or lung: Secondary | ICD-10-CM | POA: Diagnosis not present

## 2017-04-02 DIAGNOSIS — E1122 Type 2 diabetes mellitus with diabetic chronic kidney disease: Secondary | ICD-10-CM | POA: Diagnosis not present

## 2017-04-02 DIAGNOSIS — C649 Malignant neoplasm of unspecified kidney, except renal pelvis: Secondary | ICD-10-CM | POA: Diagnosis not present

## 2017-04-02 DIAGNOSIS — C641 Malignant neoplasm of right kidney, except renal pelvis: Secondary | ICD-10-CM

## 2017-04-02 DIAGNOSIS — C8300 Small cell B-cell lymphoma, unspecified site: Secondary | ICD-10-CM | POA: Diagnosis not present

## 2017-04-02 DIAGNOSIS — I129 Hypertensive chronic kidney disease with stage 1 through stage 4 chronic kidney disease, or unspecified chronic kidney disease: Secondary | ICD-10-CM | POA: Diagnosis not present

## 2017-04-02 MED ORDER — HYDROCODONE-ACETAMINOPHEN 5-325 MG PO TABS: 1.0000 | ORAL_TABLET | Freq: Three times a day (TID) | ORAL | 0 refills | Status: DC | PRN

## 2017-04-04 ENCOUNTER — Other Ambulatory Visit: Payer: Self-pay | Admitting: *Deleted

## 2017-04-04 NOTE — Patient Outreach (Signed)
Telephone encounter successful (final transition of care call), follow up on recent hospitalization 5/17-5/19/2018 for COPD exacerbation, acute respiratory failure, hypoxia.   Spoke with pt, HIPAA identifiers provided during phone call.  Pt reports doing good, getting stronger, taking all medications as ordered.  Pt reports no change in sob, avoiding heat (temperatures 95+).   Pt reports sugars been ranging 110, to see Oncologist in 2 weeks, put on a new chemo medication, MD wants to check on it.  Pt reports he is able to afford co pay of new chemo medication at this time, knows he needs it.  RN CM discussed with pt today final transition of care call, plan to follow up again next month telephonically to which pt was in agreement with.    Plan:   As discussed with pt, plan to follow up again next month telephonically check on health status.              Care plan updated.    [name].   [place] Care Management  [phone]

## 2017-04-12 DIAGNOSIS — C649 Malignant neoplasm of unspecified kidney, except renal pelvis: Secondary | ICD-10-CM | POA: Diagnosis not present

## 2017-04-12 DIAGNOSIS — C3410 Malignant neoplasm of upper lobe, unspecified bronchus or lung: Secondary | ICD-10-CM | POA: Diagnosis not present

## 2017-04-12 DIAGNOSIS — E1122 Type 2 diabetes mellitus with diabetic chronic kidney disease: Secondary | ICD-10-CM | POA: Diagnosis not present

## 2017-04-12 DIAGNOSIS — I129 Hypertensive chronic kidney disease with stage 1 through stage 4 chronic kidney disease, or unspecified chronic kidney disease: Secondary | ICD-10-CM | POA: Diagnosis not present

## 2017-04-12 DIAGNOSIS — M6281 Muscle weakness (generalized): Secondary | ICD-10-CM | POA: Diagnosis not present

## 2017-04-12 DIAGNOSIS — C8300 Small cell B-cell lymphoma, unspecified site: Secondary | ICD-10-CM | POA: Diagnosis not present

## 2017-04-12 DIAGNOSIS — M25532 Pain in left wrist: Secondary | ICD-10-CM | POA: Diagnosis not present

## 2017-04-12 DIAGNOSIS — N189 Chronic kidney disease, unspecified: Secondary | ICD-10-CM | POA: Diagnosis not present

## 2017-04-12 DIAGNOSIS — J449 Chronic obstructive pulmonary disease, unspecified: Secondary | ICD-10-CM | POA: Diagnosis not present

## 2017-04-18 ENCOUNTER — Ambulatory Visit
Admission: RE | Admit: 2017-04-18 | Discharge: 2017-04-18 | Disposition: A | Discharge status: Home / Self Care | Payer: Medicare HMO | Source: Ambulatory Visit | Attending: Internal Medicine | Admitting: Internal Medicine

## 2017-04-18 DIAGNOSIS — I700 Atherosclerosis of aorta: Secondary | ICD-10-CM | POA: Diagnosis not present

## 2017-04-18 DIAGNOSIS — N183 Chronic kidney disease, stage 3 unspecified: Secondary | ICD-10-CM

## 2017-04-18 DIAGNOSIS — J432 Centrilobular emphysema: Secondary | ICD-10-CM | POA: Insufficient documentation

## 2017-04-18 DIAGNOSIS — C9510 Chronic leukemia of unspecified cell type not having achieved remission: Secondary | ICD-10-CM | POA: Diagnosis not present

## 2017-04-18 DIAGNOSIS — N289 Disorder of kidney and ureter, unspecified: Secondary | ICD-10-CM | POA: Insufficient documentation

## 2017-04-18 DIAGNOSIS — C8300 Small cell B-cell lymphoma, unspecified site: Secondary | ICD-10-CM | POA: Insufficient documentation

## 2017-04-18 LAB — GLUCOSE, CAPILLARY: GLUCOSE-CAPILLARY: 114 mg/dL — AB (ref 65–99)

## 2017-04-18 MED ORDER — FLUDEOXYGLUCOSE F - 18 (FDG) INJECTION
13.0000 | Freq: Once | INTRAVENOUS | Status: AC | PRN
  Administered 2017-04-18: 12.805 via INTRAVENOUS

## 2017-04-19 ENCOUNTER — Inpatient Hospital Stay (HOSPITAL_BASED_OUTPATIENT_CLINIC_OR_DEPARTMENT_OTHER): Payer: Medicare HMO | Admitting: Internal Medicine

## 2017-04-19 ENCOUNTER — Inpatient Hospital Stay: Payer: Medicare HMO

## 2017-04-19 ENCOUNTER — Inpatient Hospital Stay: Payer: Medicare HMO | Attending: Internal Medicine

## 2017-04-19 VITALS — BP 127/80 | HR 111 | Temp 97.8°F | Resp 24 | Ht 68.0 in | Wt 134.0 lb

## 2017-04-19 DIAGNOSIS — Z9221 Personal history of antineoplastic chemotherapy: Secondary | ICD-10-CM

## 2017-04-19 DIAGNOSIS — Z8553 Personal history of malignant neoplasm of renal pelvis: Secondary | ICD-10-CM

## 2017-04-19 DIAGNOSIS — M6281 Muscle weakness (generalized): Secondary | ICD-10-CM | POA: Diagnosis not present

## 2017-04-19 DIAGNOSIS — Z8673 Personal history of transient ischemic attack (TIA), and cerebral infarction without residual deficits: Secondary | ICD-10-CM | POA: Diagnosis not present

## 2017-04-19 DIAGNOSIS — Z79899 Other long term (current) drug therapy: Secondary | ICD-10-CM | POA: Insufficient documentation

## 2017-04-19 DIAGNOSIS — Z452 Encounter for adjustment and management of vascular access device: Secondary | ICD-10-CM | POA: Insufficient documentation

## 2017-04-19 DIAGNOSIS — C649 Malignant neoplasm of unspecified kidney, except renal pelvis: Secondary | ICD-10-CM | POA: Diagnosis not present

## 2017-04-19 DIAGNOSIS — Z809 Family history of malignant neoplasm, unspecified: Secondary | ICD-10-CM

## 2017-04-19 DIAGNOSIS — G8929 Other chronic pain: Secondary | ICD-10-CM | POA: Diagnosis not present

## 2017-04-19 DIAGNOSIS — Z85118 Personal history of other malignant neoplasm of bronchus and lung: Secondary | ICD-10-CM | POA: Insufficient documentation

## 2017-04-19 DIAGNOSIS — N189 Chronic kidney disease, unspecified: Secondary | ICD-10-CM | POA: Diagnosis not present

## 2017-04-19 DIAGNOSIS — Z87891 Personal history of nicotine dependence: Secondary | ICD-10-CM | POA: Insufficient documentation

## 2017-04-19 DIAGNOSIS — C8300 Small cell B-cell lymphoma, unspecified site: Secondary | ICD-10-CM

## 2017-04-19 DIAGNOSIS — I700 Atherosclerosis of aorta: Secondary | ICD-10-CM | POA: Diagnosis not present

## 2017-04-19 DIAGNOSIS — I10 Essential (primary) hypertension: Secondary | ICD-10-CM | POA: Insufficient documentation

## 2017-04-19 DIAGNOSIS — E119 Type 2 diabetes mellitus without complications: Secondary | ICD-10-CM | POA: Diagnosis not present

## 2017-04-19 DIAGNOSIS — J9610 Chronic respiratory failure, unspecified whether with hypoxia or hypercapnia: Secondary | ICD-10-CM | POA: Insufficient documentation

## 2017-04-19 DIAGNOSIS — Z8042 Family history of malignant neoplasm of prostate: Secondary | ICD-10-CM | POA: Insufficient documentation

## 2017-04-19 DIAGNOSIS — K219 Gastro-esophageal reflux disease without esophagitis: Secondary | ICD-10-CM

## 2017-04-19 DIAGNOSIS — C3410 Malignant neoplasm of upper lobe, unspecified bronchus or lung: Secondary | ICD-10-CM | POA: Diagnosis not present

## 2017-04-19 DIAGNOSIS — E1122 Type 2 diabetes mellitus with diabetic chronic kidney disease: Secondary | ICD-10-CM | POA: Diagnosis not present

## 2017-04-19 DIAGNOSIS — Z9981 Dependence on supplemental oxygen: Secondary | ICD-10-CM | POA: Diagnosis not present

## 2017-04-19 DIAGNOSIS — R0789 Other chest pain: Secondary | ICD-10-CM

## 2017-04-19 DIAGNOSIS — Z85048 Personal history of other malignant neoplasm of rectum, rectosigmoid junction, and anus: Secondary | ICD-10-CM

## 2017-04-19 DIAGNOSIS — I129 Hypertensive chronic kidney disease with stage 1 through stage 4 chronic kidney disease, or unspecified chronic kidney disease: Secondary | ICD-10-CM | POA: Diagnosis not present

## 2017-04-19 DIAGNOSIS — J449 Chronic obstructive pulmonary disease, unspecified: Secondary | ICD-10-CM | POA: Diagnosis not present

## 2017-04-19 DIAGNOSIS — F0390 Unspecified dementia without behavioral disturbance: Secondary | ICD-10-CM | POA: Diagnosis not present

## 2017-04-19 DIAGNOSIS — Z95828 Presence of other vascular implants and grafts: Secondary | ICD-10-CM

## 2017-04-19 LAB — COMPREHENSIVE METABOLIC PANEL
ALK PHOS: 55 U/L (ref 38–126)
ALT: 12 U/L — AB (ref 17–63)
AST: 20 U/L (ref 15–41)
Albumin: 3.9 g/dL (ref 3.5–5.0)
Anion gap: 9 (ref 5–15)
BUN: 20 mg/dL (ref 6–20)
CALCIUM: 9 mg/dL (ref 8.9–10.3)
CO2: 40 mmol/L — AB (ref 22–32)
CREATININE: 1.08 mg/dL (ref 0.61–1.24)
Chloride: 90 mmol/L — ABNORMAL LOW (ref 101–111)
GFR calc non Af Amer: >60 mL/min (ref >60)
GLUCOSE: 109 mg/dL — AB (ref 65–99)
Potassium: 3.9 mmol/L (ref 3.5–5.1)
SODIUM: 139 mmol/L (ref 135–145)
Total Bilirubin: 0.3 mg/dL (ref 0.3–1.2)
Total Protein: 6.9 g/dL (ref 6.5–8.1)

## 2017-04-19 LAB — CBC WITH DIFFERENTIAL/PLATELET
BASOS PCT: 0 %
Basophils Absolute: 0 10*3/uL (ref 0–0.1)
EOS PCT: 1 %
Eosinophils Absolute: 0.1 10*3/uL (ref 0–0.7)
HCT: 28.3 % — ABNORMAL LOW (ref 40.0–52.0)
Hemoglobin: 9.3 g/dL — ABNORMAL LOW (ref 13.0–18.0)
Lymphocytes Relative: 18 %
Lymphs Abs: 0.9 10*3/uL — ABNORMAL LOW (ref 1.0–3.6)
MCH: 28.8 pg (ref 26.0–34.0)
MCHC: 32.8 g/dL (ref 32.0–36.0)
MCV: 87.8 fL (ref 80.0–100.0)
MONO ABS: 0.6 10*3/uL (ref 0.2–1.0)
MONOS PCT: 12 %
NEUTROS PCT: 69 %
Neutro Abs: 3.7 10*3/uL (ref 1.4–6.5)
PLATELETS: 202 10*3/uL (ref 150–440)
RBC: 3.22 MIL/uL — ABNORMAL LOW (ref 4.40–5.90)
RDW: 14.6 % — AB (ref 11.5–14.5)
WBC: 5.3 10*3/uL (ref 3.8–10.6)

## 2017-04-19 LAB — LACTATE DEHYDROGENASE: LDH: 178 U/L (ref 98–192)

## 2017-04-19 MED ORDER — SODIUM CHLORIDE 0.9% FLUSH
10.0000 mL | INTRAVENOUS | Status: DC | PRN
  Administered 2017-04-19: 10 mL via INTRAVENOUS
  Filled 2017-04-19: qty 10

## 2017-04-19 MED ORDER — HEPARIN SOD (PORK) LOCK FLUSH 100 UNIT/ML IV SOLN
500.0000 [IU] | Freq: Once | INTRAVENOUS | Status: AC
  Administered 2017-04-19: 500 [IU] via INTRAVENOUS

## 2017-04-19 NOTE — Progress Notes (Signed)
[place] OFFICE PROGRESS NOTE  Patient Care Team: [name], MD as PCP - General (Family Medicine) Lyman Speller, RN as [place] Management [place], D[place]el Iha, [facility] as [place] Management (Pharmacist)  Cancer Staging Lung cancer, upper lobe [facility]) Staging form: Lung, AJCC 7th Edition - Clinical stage from 06/15/2010: yT2, N2, M0 - Signed by [name], NP on 02/03/2015 - Pathologic stage from 04/04/2014: Maureen Chatters, M1 - Signed by [name], NP on 02/03/2015  Lymphoma, small lymphocytic ([place]) Staging form: Lymphoid Neoplasms, AJCC 6th Edition - Clinical stage from 10/15/2013: Stage II - Signed by [name], NP on 02/03/2015  Renal cell cancer [facility]) Staging form: Kidney, AJCC 7th Edition - Clinical stage from 02/03/2015: Stage I (yT1a, N0, M0) - Signed by [name], NP on 02/03/2015    Oncology History   1. Right upper lobe lung mass biopsies positive for adenocarcinoma; PET- T2, N2, M0;  stage IIIA;  carboplatinum Alimta and Avastin;  [april  9th of 2013]  Patient had an allergic reaction to carboplatinum with acute bronchospasm in May of 2013; carbo-discontinued.; Maintenance Alimta and Avastin  from June of 2013 hold chemotherapy because of increasing shortness of breath (in July, 2013); VARISTRAT-GOOD; Started on [place] February 3 about 2014; Diarrhea 4 days after Tarceva was started.  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   # SEP 2017- GAZYVA x1 [stopped sec to respi issues]  # Nov 1st week- START Ibrutinib 2 pills day;  Last Jan 22nd 2018 [sec to co-pay].   # FEB 19th- cycle #1 Bendamustine [day-1& 2- 70mg /m2; Rituxan with cycle #2  # RIGHT KIDNEY CA- RCC [s/p Bx]- s/p Cryo [Dr.Yamagat; IR; 2016]   #  Poor pulmonary function; 3 L home O2.     Lung cancer, upper lobe ([place])   02/03/2015 Initial Diagnosis    Lung cancer, upper lobe       Lymphoma, small lymphocytic ([place])   02/03/2015 Initial Diagnosis    Lymphoma, small lymphocytic (H[facility])      Cancer of lung ([place])   12/21/2015 Initial Diagnosis    Cancer of lung ([place])      Primary cancer of right upper lobe of lung ([place])   06/01/2016 Initial Diagnosis    Primary cancer of right upper lobe of lung ([place])       INTERVAL HISTORY:  Craig [name]. 66 y.o.  male pleasant patientHistory of chronic respiratory failure on home O2/Severe COPD with above history of Multiple malignancies- most recently with progressive SLL- Currently back on ibrutinib since mid June 2018 is here for follow-up/to review the results of his PET scan.  Continues to have chronic shortness of breath is not any worse. Chronic cough. No hemoptysis. Continues to have chronic chest wall pain- for which is on narcotic pain medication. He denies any weight loss. Denies any night sweats. He denies any new lumps or bumps. Fortunately patient has not been admitted to the hospital for his COPD exacerbation this time. He denies any diarrhea. Denies any easy bruising.  Denies any palpitations.  REVIEW OF SYSTEMS:  A complete 10 point review of system is done which is negative except mentioned above/history of present illness.   PAST MEDICAL HISTORY :  Past Medical History:  Diagnosis Date  . Asthma   . Cancer ([place])   . Colon cancer ([place])   . COPD (chronic obstructive pulmonary disease) ([place])   . Diabetes mellitus without complication ([place])   . Difficulty sleeping   . Dysrhythmia   . GERD (gastroesophageal reflux disease)   . Glaucoma   . History of bronchitis   . Hypertension   .  Lung cancer ([place])    right upper lobe mass positive for adenocarcinoma  . Lymphoma (H[facility])    low grade  . Renal cancer ([place])   . Respiratory failure ([place]) 07/12/2010   acute  . Right renal mass   . Shortness of breath dyspnea   . Stroke ([place]) 1990'S   NO RESIDUAL PROBLEMS  . Supplemental oxygen dependent    USES 3 LITERS CONTINUOUSLY    PAST SURGICAL HISTORY :   Past Surgical History:  Procedure Laterality Date  . IR GENERIC HISTORICAL  05/31/2016   IR RADIOLOGIST EVAL & MGMT 05/31/2016 Aletta Edouard, MD [facility] INTERV RAD    FAMILY HISTORY :   Family History  Problem Relation Age of Onset  . Diabetes Mellitus II Mother   . Hypertension Mother   . Cancer Mother   . Diabetes Mother   . Tuberculosis Father   . Positive PPD/TB Exposure Father   . Diabetes Mellitus II Sister   . Cancer Sister   . Prostate cancer Brother   . Cancer Brother   . Cancer Sister     SOCIAL HISTORY:   Social History  Substance Use Topics  . Smoking status: Former Smoker    Packs/day: 1.00    Years: 30.00    Types: Cigarettes    Start date: 12/28/1968    Quit date: 01/13/2010  . Smokeless tobacco: Never Used  . Alcohol use No     Comment: stopped 2002     ALLERGIES:  is allergic to iodinated diagnostic agents and nexium [esomeprazole magnesium].  MEDICATIONS:  Current Outpatient Prescriptions  Medication Sig Dispense Refill  . albuterol (PROVENTIL HFA;VENTOLIN HFA) 108 (90 BASE) MCG/ACT inhaler Inhale 2 puffs into the lungs every 6 (six) hours as needed for wheezing or shortness of breath.     Marland Kitchen albuterol (PROVENTIL) (2.5 MG/3ML) 0.083% nebulizer solution USE 1 VIAL IN NEBULIZER EVERY 3-4 HOURS AS NEEDED 75 mL 3  . budesonide-formoterol (SYMBICORT) 160-4.5 MCG/ACT inhaler Inhale 2 puffs into the lungs 2 (two) times daily.     . diphenhydrAMINE (BENADRYL) 25 MG tablet Take 1 tablet (25 mg total) by mouth as needed for itching or allergies.    . feeding supplement, ENSURE ENLIVE, (ENSURE  ENLIVE) LIQD Take 237 mLs by mouth 2 (two) times daily between meals. 237 mL 12  . HYDROcodone-acetaminophen (NORCO/VICODIN) 5-325 MG tablet Take 1 tablet by mouth every 8 (eight) hours as needed for moderate pain. Please Note new directions 90 tablet 0  . Ibrutinib 420 MG TABS Take 420 mg by mouth daily. 30 tablet 4  . losartan-hydrochlorothiazide (HYZAAR) 50-12.5 MG per tablet Take 1 tablet by mouth daily.    . pioglitazone (ACTOS) 30 MG tablet Take 30 mg by mouth daily.    . predniSONE (DELTASONE) 10 MG tablet TAKE 1 TABLET (10 MG TOTAL) BY MOUTH DAILY WITH BREAKFAST. 30 tablet 3  .  temazepam (RESTORIL) 15 MG capsule Take 15 mg by mouth at bedtime as needed for sleep.     Marland Kitchen tiotropium (SPIRIVA) 18 MCG inhalation capsule Place 18 mcg into inhaler and inhale daily.     Marland Kitchen dextromethorphan-guaiFENesin (MUCINEX DM) 30-600 MG 12hr tablet Take 1 tablet by mouth 2 (two) times daily. (Patient not taking: Reported on 03/14/2017) 16 tablet 0   No current facility-administered medications for this visit.    Facility-Administered Medications Ordered in Other Visits  Medication Dose Route Frequency Provider Last Rate Last Dose  . sodium chloride 0.9 % injection 10 mL  10 mL Intracatheter PRN Choksi, Janak, MD   10 mL at 04/28/15 1410  . sodium chloride 0.9 % injection 10 mL  10 mL Intravenous PRN Forest Gleason, MD   10 mL at 05/12/15 1350  . sodium chloride flush (NS) 0.9 % injection 10 mL  10 mL Intravenous PRN [name], MD   10 mL at 04/19/17 1026    PHYSICAL EXAMINATION: ECOG PERFORMANCE STATUS: 2 - Symptomatic, <50% confined to bed  BP 127/80 (Patient Position: Sitting)   Pulse (!) 111   Temp 97.8 F (36.6 C) (Tympanic)   Resp (!) 24   Ht 5\' 8"  (1.727 m)   Wt 134 lb (60.8 kg)   SpO2 91%   BMI 20.37 kg/m   Filed Weights   04/19/17 0950  Weight: 134 lb (60.8 kg)    GENERAL: Well-nourished well-developed; Alert, no distress and comfortable. He is alone. He is on home oxygen.  He is in a wheelchair.  EYES: no pallor or icterus OROPHARYNX: no thrush or ulceration; good dentition  NECK: supple, no masses felt LYMPH:  no palpable lymphadenopathy in the cervical, axillary or inguinal regions LUNGS:Bilateral decreased air entry to auscultationn and  No wheeze or crackles HEART/CVS: regular rate & rhythm and no murmurs; No lower extremity edema ABDOMEN:abdomen soft, non-tender and normal bowel sounds Musculoskeletal:no cyanosis of digits and no clubbing  PSYCH: alert & oriented x 3 with fluent speech NEURO: no focal motor/sensory deficits SKIN:  no rashes or significant lesions  LABORATORY DATA:  I have reviewed the data as listed    Component Value Date/Time   NA 139 04/19/2017 0910   NA 139 01/17/2015 0913   K 3.9 04/19/2017 0910   K 3.8 01/17/2015 0913   CL 90 (L) 04/19/2017 0910   CL 99 (L) 01/17/2015 0913   CO2 40 (H) 04/19/2017 0910   CO2 35 (H) 01/17/2015 0913   GLUCOSE 109 (H) 04/19/2017 0910   GLUCOSE 169 (H) 01/17/2015 0913   BUN 20 04/19/2017 0910   BUN 15 01/17/2015 0913   CREATININE 1.08 04/19/2017 0910   CREATININE 1.23 01/17/2015 0913   CALCIUM 9.0 04/19/2017 0910   CALCIUM 8.8 (L) 01/17/2015 0913   PROT 6.9 04/19/2017 0910   PROT 6.6 01/17/2015 0913   ALBUMIN 3.9 04/19/2017 0910   ALBUMIN 3.7 01/17/2015 0913   AST 20 04/19/2017 0910   AST 20 01/17/2015 0913   ALT 12 (L) 04/19/2017 0910   ALT 15 (L) 01/17/2015 0913   ALKPHOS 55 04/19/2017 0910   ALKPHOS 76 01/17/2015 0913   BILITOT 0.3 04/19/2017 0910   BILITOT 0.5 01/17/2015 0913   GFRNONAA >60 04/19/2017 0910   GFRNONAA >60 01/17/2015 0913   GFRAA >60 04/19/2017 0910   GFRAA >60 01/17/2015 0913    No results found for: SPEP, UPEP  Lab Results  Component Value Date   WBC 5.3 04/19/2017  NEUTROABS 3.7 04/19/2017   HGB 9.3 (L) 04/19/2017   HCT 28.3 (L) 04/19/2017   MCV 87.8 04/19/2017   PLT 202 04/19/2017      Chemistry      Component Value Date/Time   NA 139  04/19/2017 0910   NA 139 01/17/2015 0913   K 3.9 04/19/2017 0910   K 3.8 01/17/2015 0913   CL 90 (L) 04/19/2017 0910   CL 99 (L) 01/17/2015 0913   CO2 40 (H) 04/19/2017 0910   CO2 35 (H) 01/17/2015 0913   BUN 20 04/19/2017 0910   BUN 15 01/17/2015 0913   CREATININE 1.08 04/19/2017 0910   CREATININE 1.23 01/17/2015 0913      Component Value Date/Time   CALCIUM 9.0 04/19/2017 0910   CALCIUM 8.8 (L) 01/17/2015 0913   ALKPHOS 55 04/19/2017 0910   ALKPHOS 76 01/17/2015 0913   AST 20 04/19/2017 0910   AST 20 01/17/2015 0913   ALT 12 (L) 04/19/2017 0910   ALT 15 (L) 01/17/2015 0913   BILITOT 0.3 04/19/2017 0910   BILITOT 0.5 01/17/2015 0913       RADIOGRAPHIC STUDIES: I have personally reviewed the radiological images as listed and agreed with the findings in the report. Nm Pet Image Restag (ps) Skull Base To Thigh  Result Date: 04/18/2017 CLINICAL DATA:  Subsequent Treatment strategy for small cell lymphocytic leukemia. Reported history of lung cancer and right renal cell carcinoma with prior ablation. EXAM: NUCLEAR MEDICINE PET SKULL BASE TO THIGH TECHNIQUE: 12.8 mCi F-18 FDG was injected intravenously. Full-ring PET imaging was performed from the skull base to thigh after the radiotracer. CT data was obtained and used for attenuation correction and anatomic localization. FASTING BLOOD GLUCOSE:  Value: 114 mg/dl COMPARISON:  Multiple exams, including 05/30/2016 FINDINGS: NECK No hypermetabolic lymph nodes in the neck. Old left medial orbital wall fracture. CHEST Posterolateral right mid chest airspace opacity with volume loss and continued low-grade activity, maximum SUV in this vicinity 2.5, previously 3.2. This may well be treatment related. Bandlike atelectasis or scarring in the right lung. Centrilobular emphysema. No significant abnormal hypermetabolic activity in the chest. Left Port-A-Cath tip:  Lower SVC. ABDOMEN/PELVIS Photopenic complex right mid kidney lesion, probably a site of  prior renal cell carcinoma ablation, no current abnormal hypermetabolic activity in this vicinity. Stable appearance of indistinct tissue planes in the porta hepatis without well-defined measurable adenopathy. Scattered small retroperitoneal lymph nodes. No hypermetabolic adenopathy in the abdomen or pelvis. Physiologic activity in bowel. Aortoiliac atherosclerotic vascular disease. SKELETON No focal hypermetabolic activity to suggest skeletal metastasis. Healing right posterolateral sixth and seventh rib fractures. Old bilateral rib deformities from prior fractures. IMPRESSION: 1. No recurrent pathologic or hypermetabolic adenopathy to suggest active malignancy. 2. Low-grade activity along likely post therapy related finding in the right mid lung, no findings of active malignancy. 3. Photopenic complex right kidney lesion, likely a ablation site from prior renal cell carcinoma. 4. Aortic Atherosclerosis (ICD10-I70.0) and Emphysema (ICD10-J43.9). Electronically Signed   By: Van Clines M.D.   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 Patient unable to tolerate Rituxan; bendamustine. Previously tolerated ibrutinib- however currently discontinued because of financial considerations; started back 420 mg/day [mid June 2018]. PET scan- July 5th- no active LymphomaL/ or active malignancy.   # Advanced COPD-stable/multiple hospitalizations. Patient's pulse ox on 4 L 80%%. Currently stable.   # Pain- chest  when coughing- continue pain meds prn. Stable.    # History of lung cancer- no evidence of recurrence  # History of RCC-status post ablation; no evidence of recurrence.  # Port flush every 2 months; flushed today.  # Follow up in 4 weeks/labs.  # I reviewed the blood work- with the patient in detail; also reviewed the imaging independently [as summarized above]; and  with the patient in detail.    No orders of the defined types were placed in this encounter.     [name], MD 04/19/2017 1:18 PM

## 2017-04-19 NOTE — Assessment & Plan Note (Addendum)
#   Small lymphocytic lymphoma-AUG  2018 CT- bulky adenopathy in the retroperitoneum progression. Patient unable to tolerate Rituxan; bendamustine. Previously tolerated ibrutinib- however currently discontinued because of financial considerations; started back 420 mg/day [mid June 2018]. PET scan- July 5th- no active LymphomaL/ or active malignancy.   # Advanced COPD-stable/multiple hospitalizations. Patient's pulse ox on 4 L 80%%. Currently stable.   # Pain- chest when coughing- continue pain meds prn. Stable.    # History of lung cancer- no evidence of recurrence  # History of RCC-status post ablation; no evidence of recurrence.  # Port flush every 2 months; flushed today.  # Follow up in 4 weeks/labs.  # I reviewed the blood work- with the patient in detail; also reviewed the imaging independently [as summarized above]; and with the patient in detail.

## 2017-04-19 NOTE — Progress Notes (Signed)
Started back on Ibrutinib over a month ago. Patient has not missed any dosing.

## 2017-04-22 DIAGNOSIS — C8300 Small cell B-cell lymphoma, unspecified site: Secondary | ICD-10-CM | POA: Diagnosis not present

## 2017-04-22 DIAGNOSIS — I129 Hypertensive chronic kidney disease with stage 1 through stage 4 chronic kidney disease, or unspecified chronic kidney disease: Secondary | ICD-10-CM | POA: Diagnosis not present

## 2017-04-22 DIAGNOSIS — M6281 Muscle weakness (generalized): Secondary | ICD-10-CM | POA: Diagnosis not present

## 2017-04-22 DIAGNOSIS — C3410 Malignant neoplasm of upper lobe, unspecified bronchus or lung: Secondary | ICD-10-CM | POA: Diagnosis not present

## 2017-04-22 DIAGNOSIS — N189 Chronic kidney disease, unspecified: Secondary | ICD-10-CM | POA: Diagnosis not present

## 2017-04-22 DIAGNOSIS — F0390 Unspecified dementia without behavioral disturbance: Secondary | ICD-10-CM | POA: Diagnosis not present

## 2017-04-22 DIAGNOSIS — C649 Malignant neoplasm of unspecified kidney, except renal pelvis: Secondary | ICD-10-CM | POA: Diagnosis not present

## 2017-04-22 DIAGNOSIS — E1122 Type 2 diabetes mellitus with diabetic chronic kidney disease: Secondary | ICD-10-CM | POA: Diagnosis not present

## 2017-04-22 DIAGNOSIS — J449 Chronic obstructive pulmonary disease, unspecified: Secondary | ICD-10-CM | POA: Diagnosis not present

## 2017-04-23 ENCOUNTER — Other Ambulatory Visit: Payer: Self-pay | Admitting: Pharmacist

## 2017-04-23 ENCOUNTER — Encounter: Payer: Self-pay | Admitting: Pharmacist

## 2017-04-23 NOTE — Patient Outreach (Signed)
[facility]) Care Management  04/23/2017  Craig [name]. Feb 13, 1951 [id number]  Successful phone outreach to patient, HIPAA details verified.   Purpose of call to follow-up if he is interested in completing patient assistance applications.    Today patient reports he doesn't remember receiving them.  Discussed with patient last month he reported to [facility] Pharmacist he had them, but today he says he isn't sure.   He states he would still like to apply to see if he is eligible.  He understands there is no guarantee he will be eligible.      He will need applications for [place] Program (Symbicort), [place] (Spiriva), and [employer] and [place] [phone]).   Plan:   Will have [facility] send patient applications and follow-up with patient.   Karrie Meres, PharmD, [place] [phone]

## 2017-04-25 DIAGNOSIS — E1122 Type 2 diabetes mellitus with diabetic chronic kidney disease: Secondary | ICD-10-CM | POA: Diagnosis not present

## 2017-04-25 DIAGNOSIS — I129 Hypertensive chronic kidney disease with stage 1 through stage 4 chronic kidney disease, or unspecified chronic kidney disease: Secondary | ICD-10-CM | POA: Diagnosis not present

## 2017-04-25 DIAGNOSIS — C649 Malignant neoplasm of unspecified kidney, except renal pelvis: Secondary | ICD-10-CM | POA: Diagnosis not present

## 2017-04-25 DIAGNOSIS — C3410 Malignant neoplasm of upper lobe, unspecified bronchus or lung: Secondary | ICD-10-CM | POA: Diagnosis not present

## 2017-04-25 DIAGNOSIS — M6281 Muscle weakness (generalized): Secondary | ICD-10-CM | POA: Diagnosis not present

## 2017-04-25 DIAGNOSIS — N189 Chronic kidney disease, unspecified: Secondary | ICD-10-CM | POA: Diagnosis not present

## 2017-04-25 DIAGNOSIS — C8300 Small cell B-cell lymphoma, unspecified site: Secondary | ICD-10-CM | POA: Diagnosis not present

## 2017-04-25 DIAGNOSIS — J449 Chronic obstructive pulmonary disease, unspecified: Secondary | ICD-10-CM | POA: Diagnosis not present

## 2017-04-25 DIAGNOSIS — F0390 Unspecified dementia without behavioral disturbance: Secondary | ICD-10-CM | POA: Diagnosis not present

## 2017-04-29 DIAGNOSIS — I129 Hypertensive chronic kidney disease with stage 1 through stage 4 chronic kidney disease, or unspecified chronic kidney disease: Secondary | ICD-10-CM | POA: Diagnosis not present

## 2017-04-29 DIAGNOSIS — E1122 Type 2 diabetes mellitus with diabetic chronic kidney disease: Secondary | ICD-10-CM | POA: Diagnosis not present

## 2017-04-29 DIAGNOSIS — J449 Chronic obstructive pulmonary disease, unspecified: Secondary | ICD-10-CM | POA: Diagnosis not present

## 2017-04-29 DIAGNOSIS — F0390 Unspecified dementia without behavioral disturbance: Secondary | ICD-10-CM | POA: Diagnosis not present

## 2017-04-29 DIAGNOSIS — C649 Malignant neoplasm of unspecified kidney, except renal pelvis: Secondary | ICD-10-CM | POA: Diagnosis not present

## 2017-04-29 DIAGNOSIS — C3410 Malignant neoplasm of upper lobe, unspecified bronchus or lung: Secondary | ICD-10-CM | POA: Diagnosis not present

## 2017-04-29 DIAGNOSIS — C8300 Small cell B-cell lymphoma, unspecified site: Secondary | ICD-10-CM | POA: Diagnosis not present

## 2017-04-29 DIAGNOSIS — M6281 Muscle weakness (generalized): Secondary | ICD-10-CM | POA: Diagnosis not present

## 2017-04-29 DIAGNOSIS — N189 Chronic kidney disease, unspecified: Secondary | ICD-10-CM | POA: Diagnosis not present

## 2017-04-30 ENCOUNTER — Other Ambulatory Visit: Payer: Self-pay | Admitting: *Deleted

## 2017-04-30 DIAGNOSIS — C641 Malignant neoplasm of right kidney, except renal pelvis: Secondary | ICD-10-CM

## 2017-04-30 DIAGNOSIS — C8300 Small cell B-cell lymphoma, unspecified site: Secondary | ICD-10-CM

## 2017-04-30 DIAGNOSIS — C3492 Malignant neoplasm of unspecified part of left bronchus or lung: Secondary | ICD-10-CM

## 2017-05-01 MED ORDER — HYDROCODONE-ACETAMINOPHEN 5-325 MG PO TABS: 1.0000 | ORAL_TABLET | Freq: Three times a day (TID) | ORAL | 0 refills | Status: DC | PRN

## 2017-05-06 ENCOUNTER — Other Ambulatory Visit: Payer: Self-pay | Admitting: *Deleted

## 2017-05-06 NOTE — Patient Outreach (Signed)
Successful telephone encounter with pt Craig [name], 66 year old male - followed for transition of care/recent hospitalization 5/17-5/19/18 for COPD exacerbation, acute respiratory failure, hypoxia- transition of care program ended 04/04/17.   Spoke with pt, HIPAA identifiers provided.   Pt reports since last call with RN CM  had a little issue with COPD, MD gave me an antibiotic 1.5 weeks ago- finished, feels better.  Pt reports taking all of his medications as ordered, no problems affording, started on new chemo medication which is working/results of CT scan good.  RN CM discussed with pt plan to follow up again next month telephonically- check on respiratory status with recent incident (on antibiotic) to which pt agreed. Reinforced with pt calling MD early for signs/symptoms of respiratory issues/bronchitis to which pt voiced understanding.    Plan:  As discussed with pt, plan to follow up again next month telephonically.           Note: view in Care everywhere pt called Dr. Raul Del 04/22/17- reports chest                  Congestion, cough thick yellow/green mucous, tight in chest, sob.                  Pt had 3 admits in 6 months.     [name].   [place] Care Management  [phone]

## 2017-05-07 ENCOUNTER — Other Ambulatory Visit: Payer: Self-pay | Admitting: Pharmacy Technician

## 2017-05-07 NOTE — Patient Outreach (Signed)
[facility]) Care Management  05/07/2017  Keanu Lesniak. Sep 22, 1951 [id number]   I contacted patient in reference to patient assistance application's that I sent him on 04-23-2017 for Symbicort, Spiriva, and Imbruvica. This is the second attempt to send the application's to Mr. Bye. As of today he states that he still has not received either set of application's. I requested for the patient to wait until Friday to see if application's are received, if not he will follow up with me. If I have not heard from the patient by 05-14-2017 I will attempt to follow up with him again.  Doreene Burke, [place] [phone]

## 2017-05-08 DIAGNOSIS — C3410 Malignant neoplasm of upper lobe, unspecified bronchus or lung: Secondary | ICD-10-CM | POA: Diagnosis not present

## 2017-05-08 DIAGNOSIS — I129 Hypertensive chronic kidney disease with stage 1 through stage 4 chronic kidney disease, or unspecified chronic kidney disease: Secondary | ICD-10-CM | POA: Diagnosis not present

## 2017-05-08 DIAGNOSIS — F0390 Unspecified dementia without behavioral disturbance: Secondary | ICD-10-CM | POA: Diagnosis not present

## 2017-05-08 DIAGNOSIS — M6281 Muscle weakness (generalized): Secondary | ICD-10-CM | POA: Diagnosis not present

## 2017-05-08 DIAGNOSIS — C8300 Small cell B-cell lymphoma, unspecified site: Secondary | ICD-10-CM | POA: Diagnosis not present

## 2017-05-08 DIAGNOSIS — N189 Chronic kidney disease, unspecified: Secondary | ICD-10-CM | POA: Diagnosis not present

## 2017-05-08 DIAGNOSIS — J449 Chronic obstructive pulmonary disease, unspecified: Secondary | ICD-10-CM | POA: Diagnosis not present

## 2017-05-08 DIAGNOSIS — C649 Malignant neoplasm of unspecified kidney, except renal pelvis: Secondary | ICD-10-CM | POA: Diagnosis not present

## 2017-05-08 DIAGNOSIS — E1122 Type 2 diabetes mellitus with diabetic chronic kidney disease: Secondary | ICD-10-CM | POA: Diagnosis not present

## 2017-05-15 ENCOUNTER — Telehealth: Payer: Self-pay | Admitting: *Deleted

## 2017-05-15 NOTE — Telephone Encounter (Signed)
Noted  

## 2017-05-15 NOTE — Telephone Encounter (Signed)
Unable to refill pain medications at this time. 06/01/2017 would be reilff date as prescribed. Patient will see MD on Friday and discuss.

## 2017-05-16 ENCOUNTER — Other Ambulatory Visit: Payer: Self-pay

## 2017-05-16 ENCOUNTER — Emergency Department: Payer: Medicare HMO

## 2017-05-16 ENCOUNTER — Encounter: Payer: Self-pay | Admitting: Emergency Medicine

## 2017-05-16 ENCOUNTER — Inpatient Hospital Stay
Admission: EM | Admit: 2017-05-16 | Discharge: 2017-05-19 | DRG: 193 | Disposition: A | Discharge status: Home / Self Care | Payer: Medicare HMO | Attending: Internal Medicine | Admitting: Internal Medicine

## 2017-05-16 DIAGNOSIS — I10 Essential (primary) hypertension: Secondary | ICD-10-CM | POA: Diagnosis not present

## 2017-05-16 DIAGNOSIS — K219 Gastro-esophageal reflux disease without esophagitis: Secondary | ICD-10-CM | POA: Diagnosis present

## 2017-05-16 DIAGNOSIS — J189 Pneumonia, unspecified organism: Secondary | ICD-10-CM | POA: Diagnosis not present

## 2017-05-16 DIAGNOSIS — Y95 Nosocomial condition: Secondary | ICD-10-CM | POA: Diagnosis present

## 2017-05-16 DIAGNOSIS — Z7951 Long term (current) use of inhaled steroids: Secondary | ICD-10-CM

## 2017-05-16 DIAGNOSIS — J9621 Acute and chronic respiratory failure with hypoxia: Secondary | ICD-10-CM | POA: Diagnosis present

## 2017-05-16 DIAGNOSIS — Z79899 Other long term (current) drug therapy: Secondary | ICD-10-CM | POA: Diagnosis not present

## 2017-05-16 DIAGNOSIS — Z833 Family history of diabetes mellitus: Secondary | ICD-10-CM

## 2017-05-16 DIAGNOSIS — R062 Wheezing: Secondary | ICD-10-CM | POA: Diagnosis not present

## 2017-05-16 DIAGNOSIS — Z9981 Dependence on supplemental oxygen: Secondary | ICD-10-CM

## 2017-05-16 DIAGNOSIS — J9622 Acute and chronic respiratory failure with hypercapnia: Secondary | ICD-10-CM | POA: Diagnosis not present

## 2017-05-16 DIAGNOSIS — J441 Chronic obstructive pulmonary disease with (acute) exacerbation: Secondary | ICD-10-CM | POA: Diagnosis not present

## 2017-05-16 DIAGNOSIS — Z8249 Family history of ischemic heart disease and other diseases of the circulatory system: Secondary | ICD-10-CM | POA: Diagnosis not present

## 2017-05-16 DIAGNOSIS — Z87891 Personal history of nicotine dependence: Secondary | ICD-10-CM

## 2017-05-16 DIAGNOSIS — Z9221 Personal history of antineoplastic chemotherapy: Secondary | ICD-10-CM

## 2017-05-16 DIAGNOSIS — C3490 Malignant neoplasm of unspecified part of unspecified bronchus or lung: Secondary | ICD-10-CM | POA: Diagnosis not present

## 2017-05-16 DIAGNOSIS — J449 Chronic obstructive pulmonary disease, unspecified: Secondary | ICD-10-CM | POA: Diagnosis not present

## 2017-05-16 DIAGNOSIS — Z85528 Personal history of other malignant neoplasm of kidney: Secondary | ICD-10-CM

## 2017-05-16 DIAGNOSIS — C3411 Malignant neoplasm of upper lobe, right bronchus or lung: Secondary | ICD-10-CM | POA: Diagnosis not present

## 2017-05-16 DIAGNOSIS — Z85038 Personal history of other malignant neoplasm of large intestine: Secondary | ICD-10-CM | POA: Diagnosis not present

## 2017-05-16 DIAGNOSIS — Z8673 Personal history of transient ischemic attack (TIA), and cerebral infarction without residual deficits: Secondary | ICD-10-CM | POA: Diagnosis not present

## 2017-05-16 DIAGNOSIS — H409 Unspecified glaucoma: Secondary | ICD-10-CM | POA: Diagnosis present

## 2017-05-16 DIAGNOSIS — R0602 Shortness of breath: Secondary | ICD-10-CM | POA: Diagnosis not present

## 2017-05-16 DIAGNOSIS — J9602 Acute respiratory failure with hypercapnia: Secondary | ICD-10-CM | POA: Diagnosis not present

## 2017-05-16 DIAGNOSIS — C9110 Chronic lymphocytic leukemia of B-cell type not having achieved remission: Secondary | ICD-10-CM | POA: Diagnosis not present

## 2017-05-16 DIAGNOSIS — E119 Type 2 diabetes mellitus without complications: Secondary | ICD-10-CM | POA: Diagnosis not present

## 2017-05-16 DIAGNOSIS — G4700 Insomnia, unspecified: Secondary | ICD-10-CM | POA: Diagnosis not present

## 2017-05-16 DIAGNOSIS — R05 Cough: Secondary | ICD-10-CM | POA: Diagnosis not present

## 2017-05-16 DIAGNOSIS — J440 Chronic obstructive pulmonary disease with acute lower respiratory infection: Secondary | ICD-10-CM | POA: Diagnosis present

## 2017-05-16 DIAGNOSIS — R0600 Dyspnea, unspecified: Secondary | ICD-10-CM | POA: Diagnosis not present

## 2017-05-16 DIAGNOSIS — Z85118 Personal history of other malignant neoplasm of bronchus and lung: Secondary | ICD-10-CM | POA: Diagnosis not present

## 2017-05-16 DIAGNOSIS — A419 Sepsis, unspecified organism: Secondary | ICD-10-CM

## 2017-05-16 DIAGNOSIS — J9620 Acute and chronic respiratory failure, unspecified whether with hypoxia or hypercapnia: Secondary | ICD-10-CM | POA: Diagnosis not present

## 2017-05-16 LAB — URINALYSIS, COMPLETE (UACMP) WITH MICROSCOPIC
BILIRUBIN URINE: NEGATIVE
Bacteria, UA: NONE SEEN
Glucose, UA: NEGATIVE mg/dL
HGB URINE DIPSTICK: NEGATIVE
KETONES UR: NEGATIVE mg/dL
LEUKOCYTES UA: NEGATIVE
NITRITE: NEGATIVE
Protein, ur: NEGATIVE mg/dL
SPECIFIC GRAVITY, URINE: 1.011 (ref 1.005–1.030)
Squamous Epithelial / LPF: NONE SEEN
pH: 6 (ref 5.0–8.0)

## 2017-05-16 LAB — CBC WITH DIFFERENTIAL/PLATELET
BASOS PCT: 1 %
Basophils Absolute: 0 10*3/uL (ref 0–0.1)
EOS ABS: 0.2 10*3/uL (ref 0–0.7)
EOS PCT: 3 %
HCT: 30.5 % — ABNORMAL LOW (ref 40.0–52.0)
Hemoglobin: 9.7 g/dL — ABNORMAL LOW (ref 13.0–18.0)
LYMPHS ABS: 0.9 10*3/uL — AB (ref 1.0–3.6)
Lymphocytes Relative: 17 %
MCH: 28.5 pg (ref 26.0–34.0)
MCHC: 31.8 g/dL — ABNORMAL LOW (ref 32.0–36.0)
MCV: 89.6 fL (ref 80.0–100.0)
Monocytes Absolute: 0.6 10*3/uL (ref 0.2–1.0)
Monocytes Relative: 12 %
NEUTROS PCT: 67 %
Neutro Abs: 3.6 10*3/uL (ref 1.4–6.5)
Platelets: 174 10*3/uL (ref 150–440)
RBC: 3.4 MIL/uL — AB (ref 4.40–5.90)
RDW: 14.3 % (ref 11.5–14.5)
WBC: 5.4 10*3/uL (ref 3.8–10.6)

## 2017-05-16 LAB — PROTIME-INR
INR: 0.92
PROTHROMBIN TIME: 12.4 s (ref 11.4–15.2)

## 2017-05-16 LAB — GLUCOSE, CAPILLARY
GLUCOSE-CAPILLARY: 236 mg/dL — AB (ref 65–99)
GLUCOSE-CAPILLARY: 192 mg/dL — AB (ref 65–99)

## 2017-05-16 LAB — LACTIC ACID, PLASMA: LACTIC ACID, VENOUS: 1.6 mmol/L (ref 0.5–1.9)

## 2017-05-16 LAB — COMPREHENSIVE METABOLIC PANEL
ALK PHOS: 49 U/L (ref 38–126)
ALT: 13 U/L — AB (ref 17–63)
AST: 20 U/L (ref 15–41)
Albumin: 3.8 g/dL (ref 3.5–5.0)
Anion gap: 9 (ref 5–15)
BUN: 23 mg/dL — ABNORMAL HIGH (ref 6–20)
CALCIUM: 8.9 mg/dL (ref 8.9–10.3)
CO2: 42 mmol/L — ABNORMAL HIGH (ref 22–32)
Chloride: 86 mmol/L — ABNORMAL LOW (ref 101–111)
Creatinine, Ser: 0.92 mg/dL (ref 0.61–1.24)
GFR calc non Af Amer: >60 mL/min (ref >60)
Glucose, Bld: 214 mg/dL — ABNORMAL HIGH (ref 65–99)
Potassium: 4.4 mmol/L (ref 3.5–5.1)
SODIUM: 137 mmol/L (ref 135–145)
TOTAL PROTEIN: 6.6 g/dL (ref 6.5–8.1)
Total Bilirubin: 0.6 mg/dL (ref 0.3–1.2)

## 2017-05-16 LAB — TROPONIN I: Troponin I: <0.03 ng/mL (ref <0.03)

## 2017-05-16 MED ORDER — DEXTROMETHORPHAN POLISTIREX ER 30 MG/5ML PO SUER
30.0000 mg | Freq: Two times a day (BID) | ORAL | Status: DC
  Administered 2017-05-16 – 2017-05-19 (×6): 30 mg via ORAL
  Filled 2017-05-16 (×7): qty 5

## 2017-05-16 MED ORDER — INSULIN ASPART 100 UNIT/ML ~~LOC~~ SOLN
0.0000 [IU] | Freq: Three times a day (TID) | SUBCUTANEOUS | Status: DC
  Administered 2017-05-16: 18:00:00 5 [IU] via SUBCUTANEOUS
  Administered 2017-05-17: 3 [IU] via SUBCUTANEOUS
  Administered 2017-05-17: 5 [IU] via SUBCUTANEOUS
  Administered 2017-05-17: 09:00:00 3 [IU] via SUBCUTANEOUS
  Administered 2017-05-18: 8 [IU] via SUBCUTANEOUS
  Administered 2017-05-18 – 2017-05-19 (×3): 5 [IU] via SUBCUTANEOUS
  Filled 2017-05-16 (×8): qty 1

## 2017-05-16 MED ORDER — ALBUTEROL SULFATE (2.5 MG/3ML) 0.083% IN NEBU: 2.5000 mg | INHALATION_SOLUTION | Freq: Once | RESPIRATORY_TRACT | Status: DC

## 2017-05-16 MED ORDER — IPRATROPIUM-ALBUTEROL 0.5-2.5 (3) MG/3ML IN SOLN
3.0000 mL | Freq: Once | RESPIRATORY_TRACT | Status: AC
  Administered 2017-05-16: 3 mL via RESPIRATORY_TRACT
  Filled 2017-05-16: qty 3

## 2017-05-16 MED ORDER — IPRATROPIUM-ALBUTEROL 0.5-2.5 (3) MG/3ML IN SOLN
3.0000 mL | Freq: Four times a day (QID) | RESPIRATORY_TRACT | Status: DC
  Administered 2017-05-17 – 2017-05-19 (×10): 3 mL via RESPIRATORY_TRACT
  Filled 2017-05-16 (×11): qty 3

## 2017-05-16 MED ORDER — GUAIFENESIN ER 600 MG PO TB12
600.0000 mg | ORAL_TABLET | Freq: Two times a day (BID) | ORAL | Status: DC
  Administered 2017-05-16 – 2017-05-19 (×6): 600 mg via ORAL
  Filled 2017-05-16 (×6): qty 1

## 2017-05-16 MED ORDER — LOSARTAN POTASSIUM-HCTZ 50-12.5 MG PO TABS: 1.0000 | ORAL_TABLET | Freq: Every day | ORAL | Status: DC

## 2017-05-16 MED ORDER — HYDROCHLOROTHIAZIDE 12.5 MG PO CAPS
12.5000 mg | ORAL_CAPSULE | Freq: Every day | ORAL | Status: DC
  Administered 2017-05-17 – 2017-05-19 (×3): 12.5 mg via ORAL
  Filled 2017-05-16 (×3): qty 1

## 2017-05-16 MED ORDER — METHYLPREDNISOLONE SODIUM SUCC 125 MG IJ SOLR
125.0000 mg | Freq: Once | INTRAMUSCULAR | Status: AC
  Administered 2017-05-16: 125 mg via INTRAVENOUS
  Filled 2017-05-16: qty 2

## 2017-05-16 MED ORDER — DOCUSATE SODIUM 100 MG PO CAPS
100.0000 mg | ORAL_CAPSULE | Freq: Two times a day (BID) | ORAL | Status: DC
  Filled 2017-05-16 (×6): qty 1

## 2017-05-16 MED ORDER — DEXTROSE 5 % IV SOLN
1.0000 g | Freq: Once | INTRAVENOUS | Status: AC
  Administered 2017-05-16: 1 g via INTRAVENOUS
  Filled 2017-05-16: qty 1

## 2017-05-16 MED ORDER — SODIUM CHLORIDE 0.9% FLUSH
3.0000 mL | Freq: Two times a day (BID) | INTRAVENOUS | Status: DC
  Administered 2017-05-16 – 2017-05-19 (×6): 3 mL via INTRAVENOUS

## 2017-05-16 MED ORDER — HYDROCODONE-ACETAMINOPHEN 5-325 MG PO TABS
1.0000 | ORAL_TABLET | Freq: Three times a day (TID) | ORAL | Status: DC | PRN
  Administered 2017-05-16 – 2017-05-18 (×2): 1 via ORAL
  Filled 2017-05-16 (×2): qty 1

## 2017-05-16 MED ORDER — DIPHENHYDRAMINE HCL 25 MG PO TABS
25.0000 mg | ORAL_TABLET | ORAL | Status: DC | PRN
Start: 2017-05-16 — End: 2017-05-19
  Filled 2017-05-16: qty 1

## 2017-05-16 MED ORDER — ZOLPIDEM TARTRATE 5 MG PO TABS
5.0000 mg | ORAL_TABLET | Freq: Every day | ORAL | Status: DC
  Administered 2017-05-17 – 2017-05-18 (×3): 5 mg via ORAL
  Filled 2017-05-16 (×4): qty 1

## 2017-05-16 MED ORDER — ENSURE ENLIVE PO LIQD: 237.0000 mL | Freq: Two times a day (BID) | ORAL | Status: DC

## 2017-05-16 MED ORDER — ENOXAPARIN SODIUM 40 MG/0.4ML ~~LOC~~ SOLN
40.0000 mg | SUBCUTANEOUS | Status: DC
  Administered 2017-05-16 – 2017-05-18 (×3): 40 mg via SUBCUTANEOUS
  Filled 2017-05-16 (×3): qty 0.4

## 2017-05-16 MED ORDER — CEFEPIME HCL 2 G IJ SOLR
2.0000 g | Freq: Three times a day (TID) | INTRAMUSCULAR | Status: DC
  Administered 2017-05-16 – 2017-05-19 (×8): 2 g via INTRAVENOUS
  Filled 2017-05-16 (×12): qty 2

## 2017-05-16 MED ORDER — MOMETASONE FURO-FORMOTEROL FUM 200-5 MCG/ACT IN AERO
2.0000 | INHALATION_SPRAY | Freq: Two times a day (BID) | RESPIRATORY_TRACT | Status: DC
  Administered 2017-05-16 – 2017-05-19 (×6): 2 via RESPIRATORY_TRACT
  Filled 2017-05-16: qty 8.8

## 2017-05-16 MED ORDER — VANCOMYCIN HCL IN DEXTROSE 750-5 MG/150ML-% IV SOLN
750.0000 mg | Freq: Two times a day (BID) | INTRAVENOUS | Status: DC
  Administered 2017-05-17: 02:00:00 750 mg via INTRAVENOUS
  Filled 2017-05-16 (×4): qty 150

## 2017-05-16 MED ORDER — SODIUM CHLORIDE 0.9 % IV BOLUS (SEPSIS)
1000.0000 mL | Freq: Once | INTRAVENOUS | Status: AC
  Administered 2017-05-16: 1000 mL via INTRAVENOUS

## 2017-05-16 MED ORDER — SODIUM CHLORIDE 0.9 % IV BOLUS (SEPSIS)
500.0000 mL | Freq: Once | INTRAVENOUS | Status: AC
  Administered 2017-05-16: 500 mL via INTRAVENOUS

## 2017-05-16 MED ORDER — DM-GUAIFENESIN ER 30-600 MG PO TB12: 1.0000 | ORAL_TABLET | Freq: Two times a day (BID) | ORAL | Status: DC

## 2017-05-16 MED ORDER — ALBUTEROL SULFATE (2.5 MG/3ML) 0.083% IN NEBU: 2.5000 mg | INHALATION_SOLUTION | RESPIRATORY_TRACT | Status: DC | PRN

## 2017-05-16 MED ORDER — INSULIN ASPART 100 UNIT/ML ~~LOC~~ SOLN
0.0000 [IU] | Freq: Every day | SUBCUTANEOUS | Status: DC
  Administered 2017-05-17: 2 [IU] via SUBCUTANEOUS
  Filled 2017-05-16: qty 1

## 2017-05-16 MED ORDER — LOSARTAN POTASSIUM 50 MG PO TABS
50.0000 mg | ORAL_TABLET | Freq: Every day | ORAL | Status: DC
  Administered 2017-05-17 – 2017-05-19 (×3): 50 mg via ORAL
  Filled 2017-05-16 (×3): qty 1

## 2017-05-16 MED ORDER — VANCOMYCIN HCL IN DEXTROSE 1-5 GM/200ML-% IV SOLN
1000.0000 mg | Freq: Once | INTRAVENOUS | Status: AC
  Administered 2017-05-16: 1000 mg via INTRAVENOUS
  Filled 2017-05-16: qty 200

## 2017-05-16 MED ORDER — METHYLPREDNISOLONE SODIUM SUCC 125 MG IJ SOLR
60.0000 mg | Freq: Four times a day (QID) | INTRAMUSCULAR | Status: DC
  Administered 2017-05-16 – 2017-05-18 (×7): 60 mg via INTRAVENOUS
  Filled 2017-05-16 (×7): qty 2

## 2017-05-16 NOTE — H&P (Signed)
[facility] NAME: Pancho Rushing    MR#:  [id number]  DATE OF BIRTH:  December 23, 1950  DATE OF ADMISSION:  05/16/2017  PRIMARY CARE PHYSICIAN: [name], MD   REQUESTING/REFERRING PHYSICIAN: Rudene Re, MD  CHIEF COMPLAINT:  Sob, cough  HISTORY OF PRESENT ILLNESS:  Jahzier Villalon  is a 66 y.o. male with a known history of Stage III lung cancer, lymphoma and chemotherapy, COPD, diabetes and hypertension is presenting to the ED with a chief complaint of worsening of shortness of breath and cough. patient was given a ten-day course of doxycycline and 4 days of azithromycin with no clinical improvement of his symptoms. Patient sees Dr. Raul Del pulmonology as an outpatient. Patient was wheezing at the time of presentation, he lives on 3 L of home oxygen patient was given IV Solu-Medrol and cefepime and hospitalist team is called for admission.  PAST MEDICAL HISTORY:   Past Medical History:  Diagnosis Date  . Asthma   . Cancer ([place])   . Colon cancer ([place])   . COPD (chronic obstructive pulmonary disease) ([place])   . Diabetes mellitus without complication ([place])   . Difficulty sleeping   . Dysrhythmia   . GERD (gastroesophageal reflux disease)   . Glaucoma   . History of bronchitis   . Hypertension   . Lung cancer ([place])    right upper lobe mass positive for adenocarcinoma  . Lymphoma ([facility])    low grade  . Renal cancer ([place])   . Respiratory failure ([place]) 07/12/2010   acute  . Right renal mass   . Shortness of breath dyspnea   . Stroke ([place]) 1990'S   NO RESIDUAL PROBLEMS  . Supplemental oxygen dependent    USES 3 LITERS CONTINUOUSLY    PAST SURGICAL HISTOIRY:   Past Surgical History:  Procedure Laterality Date  . IR GENERIC HISTORICAL  05/31/2016   IR RADIOLOGIST EVAL & MGMT 05/31/2016 Aletta Edouard, MD [facility] INTERV RAD    SOCIAL HISTORY:   Social History  Substance Use Topics  . Smoking status: Former  Smoker    Packs/day: 1.00    Years: 30.00    Types: Cigarettes    Start date: 12/28/1968    Quit date: 01/13/2010  . Smokeless tobacco: Never Used  . Alcohol use No     Comment: stopped 2002     FAMILY HISTORY:   Family History  Problem Relation Age of Onset  . Diabetes Mellitus II Mother   . Hypertension Mother   . Cancer Mother   . Diabetes Mother   . Tuberculosis Father   . Positive PPD/TB Exposure Father   . Diabetes Mellitus II Sister   . Cancer Sister   . Prostate cancer Brother   . Cancer Brother   . Cancer Sister     DRUG ALLERGIES:   Allergies  Allergen Reactions  . Iodinated Diagnostic Agents Shortness Of Breath and Nausea And Vomiting    13-hour prep  . Nexium [Esomeprazole Magnesium] Other (See Comments)    headache    REVIEW OF SYSTEMS:  CONSTITUTIONAL: No fever, fatigue or weakness.  EYES: No blurred or double vision.  EARS, NOSE, AND THROAT: No tinnitus or ear pain.  RESPIRATORY: reporting worsening of  cough, shortness of breath, wheezing, denies hemoptysis.  CARDIOVASCULAR: No chest pain, orthopnea, edema.  GASTROINTESTINAL: No nausea, vomiting, diarrhea or abdominal pain.  GENITOURINARY: No dysuria, hematuria.  ENDOCRINE: No polyuria, nocturia,  HEMATOLOGY: No anemia, easy bruising  or bleeding SKIN: No rash or lesion. MUSCULOSKELETAL: No joint pain or arthritis.   NEUROLOGIC: No tingling, numbness, weakness.  PSYCHIATRY: No anxiety or depression.   MEDICATIONS AT HOME:   Prior to Admission medications   Medication Sig Start Date End Date Taking? Authorizing Provider  albuterol (PROVENTIL HFA;VENTOLIN HFA) 108 (90 BASE) MCG/ACT inhaler Inhale 2 puffs into the lungs every 6 (six) hours as needed for wheezing or shortness of breath.    Yes [provider]  albuterol (PROVENTIL) (2.5 MG/3ML) 0.083% nebulizer solution USE 1 VIAL IN NEBULIZER EVERY 3-4 HOURS AS NEEDED 03/12/17  Yes [name], MD  budesonide-formoterol (SYMBICORT)  160-4.5 MCG/ACT inhaler Inhale 2 puffs into the lungs 2 (two) times daily.    Yes [provider]  diphenhydrAMINE (BENADRYL) 25 MG tablet Take 1 tablet (25 mg total) by mouth as needed for itching or allergies. 03/02/17  Yes Bettey Costa, MD  HYDROcodone-acetaminophen (NORCO/VICODIN) 5-325 MG tablet Take 1 tablet by mouth every 8 (eight) hours as needed for moderate pain. Please Note new directions 05/01/17  Yes [name], MD  Ibrutinib 420 MG TABS Take 420 mg by mouth daily. 03/07/17  Yes [name], MD  losartan-hydrochlorothiazide (HYZAAR) 50-12.5 MG per tablet Take 1 tablet by mouth daily.   Yes [provider]  pioglitazone (ACTOS) 30 MG tablet Take 30 mg by mouth daily.   Yes [provider]  predniSONE (DELTASONE) 10 MG tablet TAKE 1 TABLET (10 MG TOTAL) BY MOUTH DAILY WITH BREAKFAST. 03/13/17  Yes [name], MD  tiotropium (SPIRIVA) 18 MCG inhalation capsule Place 18 mcg into inhaler and inhale daily.    Yes [provider]  zolpidem (AMBIEN) 10 MG tablet Take 10 mg by mouth at bedtime. 05/09/17  Yes [provider]  dextromethorphan-guaiFENesin (MUCINEX DM) 30-600 MG 12hr tablet Take 1 tablet by mouth 2 (two) times daily. Patient not taking: Reported on 03/14/2017 02/01/17   Fritzi Mandes, MD  feeding supplement, ENSURE ENLIVE, (ENSURE ENLIVE) LIQD Take 237 mLs by mouth 2 (two) times daily between meals. 03/02/17   Bettey Costa, MD      VITAL SIGNS:  Blood pressure 115/78, pulse (!) 103, temperature 98.7 F (37.1 C), temperature source Oral, resp. rate (!) 33, height 5\' 8"  (1.727 m), weight 61.2 kg (135 lb), SpO2 100 %.  PHYSICAL EXAMINATION:  GENERAL:  66 y.o.-year-old patient lying in the bed with no acute distress.  EYES: Pupils equal, round, reactive to light and accommodation. No scleral icterus. Extraocular muscles intact.  HEENT: Head atraumatic, normocephalic. Oropharynx and nasopharynx clear.  NECK:  Supple,  no jugular venous distention. No thyroid enlargement, no tenderness.  LUNGS: Diminished  breath sounds bilaterally, bilateral diffuse  wheezing,  no rales,rhonchi ; positive crepitation. No use of accessory muscles of respiration.  CARDIOVASCULAR: S1, S2 normal. No murmurs, rubs, or gallops.  ABDOMEN: Soft, nontender, nondistended. Bowel sounds present. No organomegaly or mass.  EXTREMITIES: No pedal edema, cyanosis, or clubbing.  NEUROLOGIC: Cranial nerves II through XII are intact. Muscle strength 5/5 in all extremities. Sensation intact. Gait not checked.  PSYCHIATRIC: The patient is alert and oriented x 3.  SKIN: No obvious rash, lesion, or ulcer.   LABORATORY PANEL:   CBC  Recent Labs Lab 05/16/17 1226  WBC 5.4  HGB 9.7*  HCT 30.5*  PLT 174   ------------------------------------------------------------------------------------------------------------------  Chemistries   Recent Labs Lab 05/16/17 1226  NA 137  K 4.4  CL 86*  CO2 42*  GLUCOSE 214*  BUN 23*  CREATININE 0.92  CALCIUM 8.9  AST 20  ALT 13*  ALKPHOS 49  BILITOT 0.6   ------------------------------------------------------------------------------------------------------------------  Cardiac Enzymes  Recent Labs Lab 05/16/17 1226  TROPONINI <0.03   ------------------------------------------------------------------------------------------------------------------  RADIOLOGY:  Dg Chest 2 View  Result Date: 05/16/2017 CLINICAL DATA:  66 year old male with recent productive cough. Recently finished antibiotics. Increasing respiratory difficulty. Small cell lymphocytic leukemia. EXAM: CHEST  2 VIEW COMPARISON:  PET-CT 04/18/2017. Chest radiographs 02/28/2017 and earlier. FINDINGS: Seated AP and lateral views of the chest. Stable left chest Port-A-Cath. Chronic large lung volumes with architectural distortion at the right hilum and in the right upper lobe. Chronic right posterior sixth and seventh rib  fractures. Chronic left lateral seventh and eighth rib fractures. No pneumothorax, pulmonary edema, pleural effusion or confluent pulmonary opacity. Stable mediastinal contours. Osteopenia. Stable visualized osseous structures. Negative visible bowel gas pattern. IMPRESSION: Stable pulmonary hyperinflation and post treatment changes to the right chest. No acute cardiopulmonary abnormality. Electronically Signed   By: Genevie Ann M.D.   On: 05/16/2017 13:01    EKG:   Orders placed or performed during the hospital encounter of 05/16/17  . ED EKG  . ED EKG    IMPRESSION AND PLAN:   Antonia Culbertson  is a 66 y.o. male with a known history of Stage III lung cancer, lymphoma and chemotherapy, COPD, diabetes and hypertension is presenting to the ED with a chief complaint of worsening of shortness of breath and cough. patient was given a ten-day course of doxycycline and 4 days of azithromycin with no clinical improvement of his symptoms  # Acute on chronic hypoxic respiratory failure secondary to COPD exacerbation and healthcare associated pneumonia Admit to MedSurg unit Solu-Medrol 60 mg IV every 6 hours, nebulizer treatments Cefepime, vancomycin IV. Patient has failed outpatient Z-Pak and doxycycline  #Healthcare associated pneumonia-clinically-failed 2 courses of outpatient antibiotics  Will get sputum culture and sensitivity if patient can provide a sample Provide cefepime and vancomycin. We will stop vancomycin as MRSA PCR is negative  #History of lung cancer and lymphoma currently on chemotherapy Hold today's chemotherapy and consult oncology Dr. Rogue Bussing  # diabetes mellitus Check hemoglobin A1c Sliding scale insulin  #Essential hypertension Continue home meds Hyzaar   DVT prophylaxis with Lovenox subcutaneous  All the records are reviewed and case discussed with ED provider. Management plans discussed with the patient, family and they are in agreement.  CODE STATUS: fc,sister  HCPOA  TOTAL TIME TAKING CARE OF THIS PATIENT: 45  minutes.   Note: This dictation was prepared with Dragon dictation along with smaller phrase technology. Any transcriptional errors that result from this process are unintentional.  Nicholes Mango M.D on 05/16/2017 at 2:35 PM  Between 7am to 6pm - Pager - [phone]  After 6pm go to [email or web address] - password [email or web address] [facility]  Office  [phone]  CC: Primary care physician; [name], MD

## 2017-05-16 NOTE — ED Notes (Signed)
This RN to bedside. Pt requesting to go to the bathroom, pt given a urinal due to dyspnea with exertion. Pt states he is okay with that. Pt also requesting something to drink and something for pain. This RN explained would have verify orders prior to giving him anything for pain or to eat/drink. Pt states understanding. Will continue to monitor of further patient needs.

## 2017-05-16 NOTE — ED Provider Notes (Signed)
[facility] Emergency Department Provider Note  ____________________________________________  Time seen: Approximately 1:15 PM  I have reviewed the triage vital signs and the nursing notes.   HISTORY  Chief Complaint Respiratory Distress   HPI Craig [name]. is a 66 y.o. male with a history of lung cancer, lymphoma on chemotherapy, COPD, diabetes who presents for evaluation of shortness of breath. Patient has finished 2 courses of by mouth antibiotics for productive cough and shortness of breath. His symptoms are not improving. He complains ofproductive cough for almost a month. Took 10 days of doxycycline and 4 days of azithromycin with no improvement of his symptoms. Over the course of the last week progressively worsening shortness of breath which got severe today and worse with minimal exertion. Has been wheezing. Cough is productive of yellow sputum. Has had chills but no fever, no nausea or vomiting, no chest pain, no abdominal pain. Patient has been using his inhalers at home with no significant improvement. Per EMS patient was satting 99% on 4 L. His baseline is 3 L nasal cannula.  Past Medical History:  Diagnosis Date  . Asthma   . Cancer ([place])   . Colon cancer ([place])   . COPD (chronic obstructive pulmonary disease) ([place])   . Diabetes mellitus without complication ([place])   . Difficulty sleeping   . Dysrhythmia   . GERD (gastroesophageal reflux disease)   . Glaucoma   . History of bronchitis   . Hypertension   . Lung cancer ([place])    right upper lobe mass positive for adenocarcinoma  . Lymphoma (HCC)    low grade  . Renal cancer ([place])   . Respiratory failure ([place]) 07/12/2010   acute  . Right renal mass   . Shortness of breath dyspnea   . Stroke ([place]) 1990'S   NO RESIDUAL PROBLEMS  . Supplemental oxygen dependent    USES 3 LITERS CONTINUOUSLY    Patient Active Problem List   Diagnosis Date Noted  . Protein-calorie malnutrition, severe  03/02/2017  . Sepsis ([place]) 12/07/2016  . Portacath in place 11/05/2016  . Cancer associated pain 08/22/2016  . Anemia in neoplastic disease 08/22/2016  . Tachycardia 08/22/2016  . 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) ([place]) 08/22/2016  . Chronic bronchitis ([place]) 06/02/2016  . Chronic renal insufficiency 06/02/2016  . Benign essential HTN 06/02/2016  . Diabetes ([place]) 06/02/2016  . Primary cancer of right upper lobe of lung ([place]) 06/01/2016  . Renal carcinoma ([place]) 02/29/2016  . Cancer of lung ([place]) 12/21/2015  . COPD exacerbation ([place]) 11/19/2015  . Acute bronchitis 11/19/2015  . SOB (shortness of breath) 11/19/2015  . Acute respiratory distress 11/19/2015  . Glaucoma 04/15/2015  . 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 BP (high blood pressure) 03/02/2015  . Cannot sleep 03/02/2015  . Controlled type 2 diabetes mellitus without complication ([place]) 94/85/4627  . Lung cancer, upper lobe ([place]) 02/03/2015  . Renal cell cancer ([place]) 02/03/2015  . Lymphoma, small lymphocytic ([place]) 02/03/2015    Past Surgical History:  Procedure Laterality Date  . IR GENERIC HISTORICAL  05/31/2016   IR RADIOLOGIST EVAL & MGMT 05/31/2016 Aletta Edouard, MD [facility] INTERV RAD    Prior to Admission medications   Medication Sig Start Date End Date Taking? Authorizing Provider  albuterol (PROVENTIL HFA;VENTOLIN HFA) 108 (90 BASE) MCG/ACT inhaler Inhale 2 puffs into the lungs every 6 (six) hours as needed for wheezing or shortness of breath.    Yes [provider]  albuterol (  PROVENTIL) (2.5 MG/3ML) 0.083% nebulizer solution USE 1 VIAL IN NEBULIZER EVERY 3-4 HOURS AS NEEDED 03/12/17  Yes [name], MD  budesonide-formoterol (SYMBICORT) 160-4.5 MCG/ACT inhaler Inhale 2 puffs into the lungs 2 (two) times daily.    Yes [provider]  diphenhydrAMINE (BENADRYL) 25 MG tablet Take 1 tablet (25 mg total) by mouth as needed for itching or allergies. 03/02/17  Yes Bettey Costa, MD    HYDROcodone-acetaminophen (NORCO/VICODIN) 5-325 MG tablet Take 1 tablet by mouth every 8 (eight) hours as needed for moderate pain. Please Note new directions 05/01/17  Yes [name], MD  Ibrutinib 420 MG TABS Take 420 mg by mouth daily. 03/07/17  Yes [name], MD  losartan-hydrochlorothiazide (HYZAAR) 50-12.5 MG per tablet Take 1 tablet by mouth daily.   Yes [provider]  pioglitazone (ACTOS) 30 MG tablet Take 30 mg by mouth daily.   Yes [provider]  predniSONE (DELTASONE) 10 MG tablet TAKE 1 TABLET (10 MG TOTAL) BY MOUTH DAILY WITH BREAKFAST. 03/13/17  Yes [name], MD  tiotropium (SPIRIVA) 18 MCG inhalation capsule Place 18 mcg into inhaler and inhale daily.    Yes [provider]  zolpidem (AMBIEN) 10 MG tablet Take 10 mg by mouth at bedtime. 05/09/17  Yes [provider]  dextromethorphan-guaiFENesin (MUCINEX DM) 30-600 MG 12hr tablet Take 1 tablet by mouth 2 (two) times daily. Patient not taking: Reported on 03/14/2017 02/01/17   Fritzi Mandes, MD  feeding supplement, ENSURE ENLIVE, (ENSURE ENLIVE) LIQD Take 237 mLs by mouth 2 (two) times daily between meals. 03/02/17   Bettey Costa, MD    Allergies Iodinated diagnostic agents and Nexium [esomeprazole magnesium]  Family History  Problem Relation Age of Onset  . Diabetes Mellitus II Mother   . Hypertension Mother   . Cancer Mother   . Diabetes Mother   . Tuberculosis Father   . Positive PPD/TB Exposure Father   . Diabetes Mellitus II Sister   . Cancer Sister   . Prostate cancer Brother   . Cancer Brother   . Cancer Sister     Social History Social History  Substance Use Topics  . Smoking status: Former Smoker    Packs/day: 1.00    Years: 30.00    Types: Cigarettes    Start date: 12/28/1968    Quit date: 01/13/2010  . Smokeless tobacco: Never Used  . Alcohol use No     Comment: stopped 2002     Review of Systems  Constitutional: Negative for fever.  + chills Eyes: Negative for visual changes. ENT: Negative for sore throat. Neck: No neck pain  Cardiovascular: Negative for chest pain. Respiratory: + shortness of breath and cough Gastrointestinal: Negative for abdominal pain, vomiting or diarrhea. Genitourinary: Negative for dysuria. Musculoskeletal: Negative for back pain. Skin: Negative for rash. Neurological: Negative for headaches, weakness or numbness. Psych: No SI or HI  ____________________________________________   PHYSICAL EXAM:  VITAL SIGNS: ED Triage Vitals  Enc Vitals Group     BP 05/16/17 1230 138/75     Pulse Rate 05/16/17 1230 (!) 108     Resp 05/16/17 1230 (!) 38     Temp 05/16/17 1230 98.7 F (37.1 C)     Temp Source 05/16/17 1230 Oral     SpO2 05/16/17 1216 99 %     Weight 05/16/17 1217 135 lb (61.2 kg)     Height 05/16/17 1217 5\' 8"  (1.727 m)     Head Circumference --  Peak Flow --      Pain Score --      Pain Loc --      Pain Edu? --      Excl. in [place]? --     Constitutional: Alert and oriented. Well appearing and in no apparent distress. HEENT:      Head: Normocephalic and atraumatic.         Eyes: Conjunctivae are normal. Sclera is non-icteric.       Mouth/Throat: Mucous membranes are moist.       Neck: Supple with no signs of meningismus. Cardiovascular: Tachycardic with regular rhythm. No murmurs, gallops, or rubs. 2+ symmetrical distal pulses are present in all extremities. No JVD. Respiratory: Patient looks dyspneic on exam with increased work of breathing, tachypnea, satting 99% on 3 L nasal cannula, coarse rhonchi bilaterally with decreased air movement  Gastrointestinal: Soft, non tender, and non distended with positive bowel sounds. No rebound or guarding. Musculoskeletal: Nontender with normal range of motion in all extremities. No edema, cyanosis, or erythema of extremities. Neurologic: Normal speech and language. Face is symmetric. Moving all extremities. No gross focal neurologic  deficits are appreciated. Skin: Skin is warm, dry and intact. No rash noted. Psychiatric: Mood and affect are normal. Speech and behavior are normal.  ____________________________________________   LABS (all labs ordered are listed, but only abnormal results are displayed)  Labs Reviewed  COMPREHENSIVE METABOLIC PANEL - Abnormal; Notable for the following:       Result Value   Chloride 86 (*)    CO2 42 (*)    Glucose, Bld 214 (*)    BUN 23 (*)    ALT 13 (*)    All other components within normal limits  CBC WITH DIFFERENTIAL/PLATELET - Abnormal; Notable for the following:    RBC 3.40 (*)    Hemoglobin 9.7 (*)    HCT 30.5 (*)    MCHC 31.8 (*)    Lymphs Abs 0.9 (*)    All other components within normal limits  CULTURE, BLOOD (ROUTINE X 2)  CULTURE, BLOOD (ROUTINE X 2)  LACTIC ACID, PLASMA  PROTIME-INR  TROPONIN I  LACTIC ACID, PLASMA  URINALYSIS, COMPLETE (UACMP) WITH MICROSCOPIC   ____________________________________________  EKG  ED ECG REPORT I, Rudene Re, the attending physician, personally viewed and interpreted this ECG.  Sinus tachycardia, rate of 109, normal intervals, right axis deviation, T-wave in anterior leads, no ST elevations or depressions. No significant changes when compared to prior from April 2018 ____________________________________________  RADIOLOGY  CXR: Stable pulmonary hyperinflation and post treatment changes to the right chest. No acute cardiopulmonary abnormality. ____________________________________________   PROCEDURES  Procedure(s) performed: None Procedures Critical Care performed:  Yes  CRITICAL CARE Performed by: Rudene Re  ?  Total critical care time: 35 min  Critical care time was exclusive of separately billable procedures and treating other patients.  Critical care was necessary to treat or prevent imminent or life-threatening deterioration.  Critical care was time spent personally by me on the  following activities: development of treatment plan with patient and/or surrogate as well as nursing, discussions with consultants, evaluation of patient's response to treatment, examination of patient, obtaining history from patient or surrogate, ordering and performing treatments and interventions, ordering and review of laboratory studies, ordering and review of radiographic studies, pulse oximetry and re-evaluation of patient's condition.  ____________________________________________   INITIAL IMPRESSION / ASSESSMENT AND PLAN / ED COURSE   66 y.o. male with a history of lung cancer, lymphoma on  chemotherapy, COPD, diabetes who presents for evaluation of shortness of breath, cough. Patient failed 2 outpatient courses of antibiotic. Moderate increased work of breathing, tachycardia, tachypnea, with coarse rhonchi bilaterally. Clinically patient meets criteria for pneumonia/ sepsis. Chest x-ray with no infiltrate. White count and lactic are within normal limits. We'll give 3 duo nebs, Solu-Medrol, and broaden antibiotic coverage cefepime for possible hospital acquired. Patient already took azithromycin today therefore we'll hold on another dose for atypicals. Plan to admit to Hospitalist     Pertinent labs & imaging results that were available during my care of the patient were reviewed by me and considered in my medical decision making (see chart for details).    ____________________________________________   FINAL CLINICAL IMPRESSION(S) / ED DIAGNOSES  Final diagnoses:  Sepsis, due to unspecified organism ([place])  Healthcare-associated pneumonia  Acute respiratory failure with hypercapnia (HCC)  COPD exacerbation ([facility])      NEW MEDICATIONS STARTED DURING THIS VISIT:  New Prescriptions   No medications on file     Note:  This document was prepared using Dragon voice recognition software and may include unintentional dictation errors.    Alfred Levins, [place], MD 05/16/17 [phone]

## 2017-05-16 NOTE — Progress Notes (Signed)
Patient has refused svn txs for the night. O2 sats fine on nasal o2. BBS noted rhonci throughout. Patient states he coughs up sputum off and on. Encouraged patient to continue to deep breath and cough. He will have RN to call if he wishes to have a tx tonight.

## 2017-05-16 NOTE — ED Triage Notes (Signed)
Pt presents to ED via [facility] from home with c/o respiratory difficulty. Per EMS pt has hx of Lymphoma Lung cancer that he currently takes PO cancer treatment at home for. EMS also reports pt is on 2nd round of abx for cough with yellow sputum, pt states he finished his Z-pack "the other day". Pt is on chronic 3L at home. EMS reports BP 135/75, 99% on 4L, CO2 42. Pt is noted to have producted cough on arrival, dyspnea with exertion. Pt reports taking an albuterol tx prior to EMS arrival. Pt is alert and oriented at this time.

## 2017-05-16 NOTE — Progress Notes (Signed)
Pharmacy Antibiotic Note  Craig [name]. is a 66 y.o. male admitted on 05/16/2017 with pneumonia.  Pharmacy has been consulted for Vancomycin and Cefepime dosing.  Hx Lung Ca , on chemo. Recent tx w/ 10 days Doxycyline and 4 days Azithromycin.  Plan: Will give Vancomycin 1 gram IV in ER and has Received Cefepime 1 gram IV in ER. Will continue with stacked dosing of Vancomycin. Vancomycin 750 IV every 12 hours.  Goal trough 15-20 mcg/mL.  Ke 0.061  T1/2 11.36  Vd 42.8  Will check Vancomycin trough prior to 5th dose on 05/18/17.  -Cefepime 2 gram IV q8h   Height: 5\' 8"  (172.7 cm) Weight: 135 lb (61.2 kg) IBW/kg (Calculated) : 68.4  Temp (24hrs), Avg:98.7 F (37.1 C), Min:98.7 F (37.1 C), Max:98.7 F (37.1 C)   Recent Labs Lab 05/16/17 1226  WBC 5.4  CREATININE 0.92  LATICACIDVEN 1.6    Estimated Creatinine Clearance: 68.4 mL/min (by C-G formula based on SCr of 0.92 mg/dL).    Allergies  Allergen Reactions  . Iodinated Diagnostic Agents Shortness Of Breath and Nausea And Vomiting    13-hour prep  . Nexium [Esomeprazole Magnesium] Other (See Comments)    headache    Antimicrobials this admission: Vanc 8/2 >>   Cefepime 8/2 >>    Dose adjustments this admission:    Microbiology results: 8/2 BCx: pending   UCx:      Sputum:    8/2 MRSA PCR: pending  Thank you for allowing pharmacy to be a part of this patient's care.  Maddock Finigan A 05/16/2017 4:05 PM

## 2017-05-16 NOTE — ED Notes (Signed)
MD at bedside at this time.

## 2017-05-17 ENCOUNTER — Inpatient Hospital Stay: Payer: Medicare HMO | Admitting: Internal Medicine

## 2017-05-17 ENCOUNTER — Inpatient Hospital Stay: Payer: Medicare HMO

## 2017-05-17 DIAGNOSIS — Z79899 Other long term (current) drug therapy: Secondary | ICD-10-CM

## 2017-05-17 DIAGNOSIS — Z8042 Family history of malignant neoplasm of prostate: Secondary | ICD-10-CM

## 2017-05-17 DIAGNOSIS — R062 Wheezing: Secondary | ICD-10-CM

## 2017-05-17 DIAGNOSIS — Z7951 Long term (current) use of inhaled steroids: Secondary | ICD-10-CM

## 2017-05-17 DIAGNOSIS — K219 Gastro-esophageal reflux disease without esophagitis: Secondary | ICD-10-CM

## 2017-05-17 DIAGNOSIS — Z8673 Personal history of transient ischemic attack (TIA), and cerebral infarction without residual deficits: Secondary | ICD-10-CM

## 2017-05-17 DIAGNOSIS — Z9221 Personal history of antineoplastic chemotherapy: Secondary | ICD-10-CM

## 2017-05-17 DIAGNOSIS — Z9981 Dependence on supplemental oxygen: Secondary | ICD-10-CM

## 2017-05-17 DIAGNOSIS — I10 Essential (primary) hypertension: Secondary | ICD-10-CM

## 2017-05-17 DIAGNOSIS — Z794 Long term (current) use of insulin: Secondary | ICD-10-CM

## 2017-05-17 DIAGNOSIS — Z87891 Personal history of nicotine dependence: Secondary | ICD-10-CM

## 2017-05-17 DIAGNOSIS — R0602 Shortness of breath: Secondary | ICD-10-CM

## 2017-05-17 DIAGNOSIS — Z7901 Long term (current) use of anticoagulants: Secondary | ICD-10-CM

## 2017-05-17 DIAGNOSIS — E119 Type 2 diabetes mellitus without complications: Secondary | ICD-10-CM

## 2017-05-17 DIAGNOSIS — C9110 Chronic lymphocytic leukemia of B-cell type not having achieved remission: Secondary | ICD-10-CM

## 2017-05-17 DIAGNOSIS — G4700 Insomnia, unspecified: Secondary | ICD-10-CM

## 2017-05-17 DIAGNOSIS — Z85038 Personal history of other malignant neoplasm of large intestine: Secondary | ICD-10-CM

## 2017-05-17 DIAGNOSIS — J449 Chronic obstructive pulmonary disease, unspecified: Secondary | ICD-10-CM

## 2017-05-17 DIAGNOSIS — Z85118 Personal history of other malignant neoplasm of bronchus and lung: Secondary | ICD-10-CM

## 2017-05-17 DIAGNOSIS — Z85528 Personal history of other malignant neoplasm of kidney: Secondary | ICD-10-CM

## 2017-05-17 DIAGNOSIS — Z809 Family history of malignant neoplasm, unspecified: Secondary | ICD-10-CM

## 2017-05-17 LAB — COMPREHENSIVE METABOLIC PANEL
ALBUMIN: 3.5 g/dL (ref 3.5–5.0)
ALK PHOS: 48 U/L (ref 38–126)
ALT: 12 U/L — AB (ref 17–63)
AST: 23 U/L (ref 15–41)
Anion gap: 8 (ref 5–15)
BILIRUBIN TOTAL: 0.5 mg/dL (ref 0.3–1.2)
BUN: 23 mg/dL — AB (ref 6–20)
CHLORIDE: 90 mmol/L — AB (ref 101–111)
CO2: 40 mmol/L — ABNORMAL HIGH (ref 22–32)
Calcium: 8.5 mg/dL — ABNORMAL LOW (ref 8.9–10.3)
Creatinine, Ser: 1.07 mg/dL (ref 0.61–1.24)
GFR calc Af Amer: >60 mL/min (ref >60)
GFR calc non Af Amer: >60 mL/min (ref >60)
GLUCOSE: 193 mg/dL — AB (ref 65–99)
POTASSIUM: 4.9 mmol/L (ref 3.5–5.1)
Sodium: 138 mmol/L (ref 135–145)
Total Protein: 6.3 g/dL — ABNORMAL LOW (ref 6.5–8.1)

## 2017-05-17 LAB — GLUCOSE, CAPILLARY
GLUCOSE-CAPILLARY: 165 mg/dL — AB (ref 65–99)
GLUCOSE-CAPILLARY: 130 mg/dL — AB (ref 65–99)
Glucose-Capillary: 222 mg/dL — ABNORMAL HIGH (ref 65–99)
Glucose-Capillary: 177 mg/dL — ABNORMAL HIGH (ref 65–99)
Glucose-Capillary: 208 mg/dL — ABNORMAL HIGH (ref 65–99)

## 2017-05-17 LAB — CBC
HEMATOCRIT: 28.1 % — AB (ref 40.0–52.0)
HEMOGLOBIN: 9.1 g/dL — AB (ref 13.0–18.0)
MCH: 29.1 pg (ref 26.0–34.0)
MCHC: 32.2 g/dL (ref 32.0–36.0)
MCV: 90.3 fL (ref 80.0–100.0)
Platelets: 146 10*3/uL — ABNORMAL LOW (ref 150–440)
RBC: 3.11 MIL/uL — ABNORMAL LOW (ref 4.40–5.90)
RDW: 14.3 % (ref 11.5–14.5)
WBC: 4.7 10*3/uL (ref 3.8–10.6)

## 2017-05-17 LAB — MRSA PCR SCREENING: MRSA BY PCR: NEGATIVE

## 2017-05-17 NOTE — Progress Notes (Signed)
Pharmacy Antibiotic Note  Craig [name]. is a 66 y.o. male admitted on 05/16/2017 with pneumonia.  Pharmacy consulted for Vancomycin and Cefepime dosing.  Hx Lung Ca , on chemo. Recent tx w/ 10 days Doxycyline and 4 days Azithromycin.  Plan: Vancomycin 750 IV every 12 hours.  Goal trough 15-20 mcg/mL.  Ke 0.061  T1/2 11.36  Vd 42.8  Will check Vancomycin trough prior to 5th dose on 05/18/17.  -Cefepime 2 gram IV q8h   Height: 5\' 8"  (172.7 cm) Weight: 135 lb (61.2 kg) IBW/kg (Calculated) : 68.4  Temp (24hrs), Avg:98.6 F (37 C), Min:98.5 F (36.9 C), Max:98.7 F (37.1 C)   Recent Labs Lab 05/16/17 1226 05/17/17 0311  WBC 5.4 4.7  CREATININE 0.92 1.07  LATICACIDVEN 1.6  --     Estimated Creatinine Clearance: 58.8 mL/min (by C-G formula based on SCr of 1.07 mg/dL).    Allergies  Allergen Reactions  . Iodinated Diagnostic Agents Shortness Of Breath and Nausea And Vomiting    13-hour prep  . Nexium [Esomeprazole Magnesium] Other (See Comments)    headache    Antimicrobials this admission: Vanc 8/2 >>   Cefepime 8/2 >>    Dose adjustments this admission:    Microbiology results: 8/2 BCx: pending   UCx:      Sputum:    8/2 MRSA PCR: pending  Thank you for allowing pharmacy to be a part of this patient's care.  Marilla Boddy D 05/17/2017 8:47 AM

## 2017-05-17 NOTE — Consult Note (Signed)
   [facility] CM Inpatient Consult   05/17/2017  Craig [name]. [birth year] [id number]  Patient is currently active with [facility] for chronic disease management services.  Patient has been engaged by a Geologist, engineering and [place].  Our community based plan of care has focused on disease management and community resource support.  Patient will receive a post discharge transition of care call and will be evaluated for monthly home visits for assessments and disease process education.  Of note, [facility] Care Management services does not replace or interfere with any services that are needed or arranged by inpatient case management or social work.  For additional questions or referrals please contact:  Niaja Stickley RN, [facility] Liaison  [phone]) [place] [phone]) Toll free office

## 2017-05-17 NOTE — Progress Notes (Signed)
Patient seen for svn treatment. Patient with loud audible rhonci in upper airways as well as throughout lung fields. Patient states svn tx usually helps him when at home and in this condition. He has a flutter valve also that he uses from time to time to help cough up secretions. svn given. Patient tolerated well. Flutter valve given. Loose congested cough noted. Will continue to monitor

## 2017-05-17 NOTE — Consult Note (Signed)
[facility] NOTE  Patient Care Team: [name], MD as PCP - General (Family Medicine) Lyman Speller, RN as [facility]  CHIEF COMPLAINTS/PURPOSE OF CONSULTATION:  SLL/CLL-lung cancer  HISTORY OF PRESENTING ILLNESS:  Craig [name]. 66 y.o.  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  Patient's most recent PET scan July 2018- showed significant response to ibrutinib. No evidence or concerns for recurrent lung or kidney malignancy.  Patient states that he had been having worsening shortness of breath cough over the last many weeks. He failed at least 2 rounds of antibiotics as prescribed by pulmonary; Dr. Raul Del.   Given the lack of improvement in outpatient basis he has been currently admitted to the hospital for further evaluation and management. Chest x-ray negative for pneumonia. Blood cultures are pending.  Patient is currently on cefepime IV steroids/breathing treatments- seems to be improving. But is still quite Short of breath especially with exertion and also been wheezing.   ROS: A complete 10 point review of system is done which is negative except mentioned above in history of present illness  MEDICAL HISTORY:  Past Medical History:  Diagnosis Date  . Asthma   . Cancer ([place])   . Colon cancer ([place])   . COPD (chronic obstructive pulmonary disease) ([place])   . Diabetes mellitus without complication ([place])   . Difficulty sleeping   . Dysrhythmia   . GERD (gastroesophageal reflux disease)   . Glaucoma   . History of bronchitis   . Hypertension   . Lung cancer ([place])    right upper lobe mass positive for adenocarcinoma  . Lymphoma ([facility])    low grade  . Renal cancer ([place])   . Respiratory failure ([place]) 07/12/2010   acute  . Right renal mass   . Shortness of breath dyspnea   . Stroke ([place]) 1990'S   NO RESIDUAL PROBLEMS   . Supplemental oxygen dependent    USES 3 LITERS CONTINUOUSLY    SURGICAL HISTORY: Past Surgical History:  Procedure Laterality Date  . IR GENERIC HISTORICAL  05/31/2016   IR RADIOLOGIST EVAL & MGMT 05/31/2016 Aletta Edouard, MD [facility] INTERV RAD    SOCIAL HISTORY: Social History   Social History  . Marital status: Single    Spouse name: N/A  . Number of children: N/A  . Years of education: N/A   Occupational History  . Not on file.   Social History Main Topics  . Smoking status: Former Smoker    Packs/day: 1.00    Years: 30.00    Types: Cigarettes    Start date: 12/28/1968    Quit date: 01/13/2010  . Smokeless tobacco: Never Used  . Alcohol use No     Comment: stopped 2002   . Drug use: No  . Sexual activity: Not Currently   Other Topics Concern  . Not on file   Social History Narrative  . No narrative on file    FAMILY HISTORY: Family History  Problem Relation Age of Onset  . Diabetes Mellitus II Mother   . Hypertension Mother   . Cancer Mother   . Diabetes Mother   . Tuberculosis Father   . Positive PPD/TB Exposure Father   . Diabetes Mellitus II Sister   . Cancer Sister   . Prostate cancer Brother   . Cancer Brother   . Cancer Sister     ALLERGIES:  is allergic to iodinated diagnostic agents and nexium [esomeprazole magnesium].  MEDICATIONS:  Current Facility-Administered Medications  Medication Dose Route Frequency Provider Last Rate Last Dose  . albuterol (PROVENTIL) (2.5 MG/3ML) 0.083% nebulizer solution 2.5 mg  2.5 mg Nebulization Q4H PRN Gouru, Aruna, MD      . ceFEPIme (MAXIPIME) 2 g in dextrose 5 % 50 mL IVPB  2 g Intravenous Q8H Gouru, Illene Silver, MD   Stopped at 05/17/17 1700  . guaiFENesin (MUCINEX) 12 hr tablet 600 mg  600 mg Oral BID Gouru, Aruna, MD   600 mg at 05/17/17 1108   And  . dextromethorphan (DELSYM) 30 MG/5ML liquid 30 mg  30 mg Oral BID Gouru, Aruna, MD   30 mg at 05/17/17 1107  . diphenhydrAMINE (BENADRYL) tablet 25 mg  25 mg  Oral PRN Gouru, Aruna, MD      . docusate sodium (COLACE) capsule 100 mg  100 mg Oral BID Gouru, Aruna, MD      . enoxaparin (LOVENOX) injection 40 mg  40 mg Subcutaneous Q24H Gouru, Aruna, MD   40 mg at 05/16/17 2123  . feeding supplement (ENSURE ENLIVE) (ENSURE ENLIVE) liquid 237 mL  237 mL Oral BID BM Gouru, Aruna, MD      . losartan (COZAAR) tablet 50 mg  50 mg Oral Daily Gouru, Aruna, MD   50 mg at 05/17/17 1108   And  . hydrochlorothiazide (MICROZIDE) capsule 12.5 mg  12.5 mg Oral Daily Gouru, Aruna, MD   12.5 mg at 05/17/17 1108  . HYDROcodone-acetaminophen (NORCO/VICODIN) 5-325 MG per tablet 1 tablet  1 tablet Oral Q8H PRN Nicholes Mango, MD   1 tablet at 05/16/17 1607  . insulin aspart (novoLOG) injection 0-15 Units  0-15 Units Subcutaneous TID WC Gouru, Aruna, MD   5 Units at 05/17/17 1226  . insulin aspart (novoLOG) injection 0-5 Units  0-5 Units Subcutaneous QHS Gouru, Aruna, MD      . ipratropium-albuterol (DUONEB) 0.5-2.5 (3) MG/3ML nebulizer solution 3 mL  3 mL Nebulization Q6H Gouru, Aruna, MD   3 mL at 05/17/17 1356  . methylPREDNISolone sodium succinate (SOLU-MEDROL) 125 mg/2 mL injection 60 mg  60 mg Intravenous Q6H Gouru, Aruna, MD   60 mg at 05/17/17 1427  . mometasone-formoterol (DULERA) 200-5 MCG/ACT inhaler 2 puff  2 puff Inhalation BID Nicholes Mango, MD   2 puff at 05/17/17 1111  . sodium chloride flush (NS) 0.9 % injection 3 mL  3 mL Intravenous Q12H Gouru, Aruna, MD   3 mL at 05/17/17 1000  . zolpidem (AMBIEN) tablet 5 mg  5 mg Oral QHS Gouru, Aruna, MD   5 mg at 05/17/17 0025   Facility-Administered Medications Ordered in Other Encounters  Medication Dose Route Frequency Provider Last Rate Last Dose  . sodium chloride 0.9 % injection 10 mL  10 mL Intracatheter PRN Choksi, Janak, MD   10 mL at 04/28/15 1410  . sodium chloride 0.9 % injection 10 mL  10 mL Intravenous PRN Forest Gleason, MD   10 mL at 05/12/15 1350      .  PHYSICAL EXAMINATION:  Vitals:   05/17/17  1107 05/17/17 1212  BP: (!) 126/55 125/65  Pulse: 96 96  Resp:    Temp:     Filed Weights   05/16/17 1217  Weight: 135 lb (61.2 kg)    GENERAL: Moderately built moderately nourished male patient. Alert, no distress and comfortable.   Accompanied by family. EYES: no pallor or icterus OROPHARYNX: no thrush or  ulceration. NECK: supple, no masses felt LYMPH:  no palpable lymphadenopathy in the cervical, axillary or inguinal regions LUNGS: decreased breath sounds to auscultation; bilateral wheezing and coarse breath sounds noted. HEART/CVS: regular rate & rhythm and no murmurs; No lower extremity edema ABDOMEN: abdomen soft, non-tender and normal bowel sounds Musculoskeletal:no cyanosis of digits and no clubbing  PSYCH: alert & oriented x 3 with fluent speech NEURO: no focal motor/sensory deficits SKIN:  no rashes or significant lesions  LABORATORY DATA:  I have reviewed the data as listed Lab Results  Component Value Date   WBC 4.7 05/17/2017   HGB 9.1 (L) 05/17/2017   HCT 28.1 (L) 05/17/2017   MCV 90.3 05/17/2017   PLT 146 (L) 05/17/2017    Recent Labs  04/19/17 0910 05/16/17 1226 05/17/17 0311  NA 139 137 138  K 3.9 4.4 4.9  CL 90* 86* 90*  CO2 40* 42* 40*  GLUCOSE 109* 214* 193*  BUN 20 23* 23*  CREATININE 1.08 0.92 1.07  CALCIUM 9.0 8.9 8.5*  GFRNONAA >60 >60 >60  GFRAA >60 >60 >60  PROT 6.9 6.6 6.3*  ALBUMIN 3.9 3.8 3.5  AST 20 20 23   ALT 12* 13* 12*  ALKPHOS 55 49 48  BILITOT 0.3 0.6 0.5    RADIOGRAPHIC STUDIES: I have personally reviewed the radiological images as listed and agreed with the findings in the report. Dg Chest 2 View  Result Date: 05/16/2017 CLINICAL DATA:  66 year old male with recent productive cough. Recently finished antibiotics. Increasing respiratory difficulty. Small cell lymphocytic leukemia. EXAM: CHEST  2 VIEW COMPARISON:  PET-CT 04/18/2017. Chest radiographs 02/28/2017 and earlier. FINDINGS: Seated AP and lateral views of the  chest. Stable left chest Port-A-Cath. Chronic large lung volumes with architectural distortion at the right hilum and in the right upper lobe. Chronic right posterior sixth and seventh rib fractures. Chronic left lateral seventh and eighth rib fractures. No pneumothorax, pulmonary edema, pleural effusion or confluent pulmonary opacity. Stable mediastinal contours. Osteopenia. Stable visualized osseous structures. Negative visible bowel gas pattern. IMPRESSION: Stable pulmonary hyperinflation and post treatment changes to the right chest. No acute cardiopulmonary abnormality. Electronically Signed   By: Genevie Ann M.D.   On: 05/16/2017 13:01   Nm Pet Image Restag (ps) Skull Base To Thigh  Result Date: 04/18/2017 CLINICAL DATA:  Subsequent Treatment strategy for small cell lymphocytic leukemia. Reported history of lung cancer and right renal cell carcinoma with prior ablation. EXAM: NUCLEAR MEDICINE PET SKULL BASE TO THIGH TECHNIQUE: 12.8 mCi F-18 FDG was injected intravenously. Full-ring PET imaging was performed from the skull base to thigh after the radiotracer. CT data was obtained and used for attenuation correction and anatomic localization. FASTING BLOOD GLUCOSE:  Value: 114 mg/dl COMPARISON:  Multiple exams, including 05/30/2016 FINDINGS: NECK No hypermetabolic lymph nodes in the neck. Old left medial orbital wall fracture. CHEST Posterolateral right mid chest airspace opacity with volume loss and continued low-grade activity, maximum SUV in this vicinity 2.5, previously 3.2. This may well be treatment related. Bandlike atelectasis or scarring in the right lung. Centrilobular emphysema. No significant abnormal hypermetabolic activity in the chest. Left Port-A-Cath tip:  Lower SVC. ABDOMEN/PELVIS Photopenic complex right mid kidney lesion, probably a site of prior renal cell carcinoma ablation, no current abnormal hypermetabolic activity in this vicinity. Stable appearance of indistinct tissue planes in the  porta hepatis without well-defined measurable adenopathy. Scattered small retroperitoneal lymph nodes. No hypermetabolic adenopathy in the abdomen or pelvis. Physiologic activity in bowel. Aortoiliac atherosclerotic vascular  disease. SKELETON No focal hypermetabolic activity to suggest skeletal metastasis. Healing right posterolateral sixth and seventh rib fractures. Old bilateral rib deformities from prior fractures. IMPRESSION: 1. No recurrent pathologic or hypermetabolic adenopathy to suggest active malignancy. 2. Low-grade activity along likely post therapy related finding in the right mid lung, no findings of active malignancy. 3. Photopenic complex right kidney lesion, likely a ablation site from prior renal cell carcinoma. 4. Aortic Atherosclerosis (ICD10-I70.0) and Emphysema (ICD10-J43.9). Electronically Signed   By: Van Clines M.D.   On: 04/18/2017 11:44    ASSESSMENT & PLAN:   # 67 year old male patient with a history of severe COPD on home O2; and multiple malignancies- currently most active SLL/CLL- on ibrutinib- currently admitted to the hospital for COPD exacerbation.  # SLL/CLL- on ibrutinib. Patient has had excellent response- based on most recent PET scan approximately a month ago that showed no evidence of disease. CT scan also shows- no evidence of any of his multiple other malignancies [lung and renal]. Recommend holding ibrutinib in the context of acute infection. This could be started approximately 2 weeks  # Repeated infections/COPD- exacerbation. Recommend checking quantitative immunoglobulins. If IgG less than 400 recommend IVIG infusions.   # COPD exacerbation- severe COPD on antibiotics/steroids/breathing treatments. Improving.  Thank you Dr.Vachhani for allowing me to participate in the care of your pleasant patient. Please do not hesitate to contact me with questions or concerns in the interim.  All questions were answered. The patient knows to call the clinic with  any problems, questions or concerns.    [name], MD 05/17/2017 5:54 PM

## 2017-05-17 NOTE — Progress Notes (Signed)
Craig [name] at [place] NAME: Craig [name]    MR#:  [id number]  DATE OF BIRTH:  [birth year]  SUBJECTIVE:  CHIEF COMPLAINT:   Chief Complaint  Patient presents with  . Respiratory Distress   On Chemo for lung cancer- had SOB for last 1-2 weeks,received ABx by Pulm in clinic - no help so came to ER. Feels little better today, still have wheezing.  REVIEW OF SYSTEMS:  CONSTITUTIONAL: No fever, positive forfatigue or weakness.  EYES: No blurred or double vision.  EARS, NOSE, AND THROAT: No tinnitus or ear pain.  RESPIRATORY: positive for cough, shortness of breath, wheezing ,no hemoptysis.  CARDIOVASCULAR: No chest pain, orthopnea, edema.  GASTROINTESTINAL: No nausea, vomiting, diarrhea or abdominal pain.  GENITOURINARY: No dysuria, hematuria.  ENDOCRINE: No polyuria, nocturia,  HEMATOLOGY: No anemia, easy bruising or bleeding SKIN: No rash or lesion. MUSCULOSKELETAL: No joint pain or arthritis.   NEUROLOGIC: No tingling, numbness, weakness.  PSYCHIATRY: No anxiety or depression.   ROS  DRUG ALLERGIES:   Allergies  Allergen Reactions  . Iodinated Diagnostic Agents Shortness Of Breath and Nausea And Vomiting    13-hour prep  . Nexium [Esomeprazole Magnesium] Other (See Comments)    headache    VITALS:  Blood pressure 125/65, pulse 96, temperature 98.6 F (37 C), resp. rate (!) 22, height 5\' 8"  (1.727 m), weight 61.2 kg (135 lb), SpO2 99 %.  PHYSICAL EXAMINATION:  GENERAL:  66 y.o.-year-old patient lying in the bed with no acute distress.  EYES: Pupils equal, round, reactive to light and accommodation. No scleral icterus. Extraocular muscles intact.  HEENT: Head atraumatic, normocephalic. Oropharynx and nasopharynx clear.  NECK:  Supple, no jugular venous distention. No thyroid enlargement, no tenderness.  LUNGS: Normal breath sounds bilaterally, bilateral coarse wheezing, and crepitation. No use of accessory muscles of respiration.   CARDIOVASCULAR: S1, S2 normal. No murmurs, rubs, or gallops.  ABDOMEN: Soft, nontender, nondistended. Bowel sounds present. No organomegaly or mass.  EXTREMITIES: No pedal edema, cyanosis, or clubbing.  NEUROLOGIC: Cranial nerves II through XII are intact. Muscle strength 5/5 in all extremities. Sensation intact. Gait not checked.  PSYCHIATRIC: The patient is alert and oriented x 3.  SKIN: No obvious rash, lesion, or ulcer.   Physical Exam LABORATORY PANEL:   CBC  Recent Labs Lab 05/17/17 0311  WBC 4.7  HGB 9.1*  HCT 28.1*  PLT 146*   ------------------------------------------------------------------------------------------------------------------  Chemistries   Recent Labs Lab 05/17/17 0311  NA 138  K 4.9  CL 90*  CO2 40*  GLUCOSE 193*  BUN 23*  CREATININE 1.07  CALCIUM 8.5*  AST 23  ALT 12*  ALKPHOS 48  BILITOT 0.5   ------------------------------------------------------------------------------------------------------------------  Cardiac Enzymes  Recent Labs Lab 05/16/17 1226  TROPONINI <0.03   ------------------------------------------------------------------------------------------------------------------  RADIOLOGY:  Dg Chest 2 View  Result Date: 05/16/2017 CLINICAL DATA:  66 year old male with recent productive cough. Recently finished antibiotics. Increasing respiratory difficulty. Small cell lymphocytic leukemia. EXAM: CHEST  2 VIEW COMPARISON:  PET-CT 04/18/2017. Chest radiographs 02/28/2017 and earlier. FINDINGS: Seated AP and lateral views of the chest. Stable left chest Port-A-Cath. Chronic large lung volumes with architectural distortion at the right hilum and in the right upper lobe. Chronic right posterior sixth and seventh rib fractures. Chronic left lateral seventh and eighth rib fractures. No pneumothorax, pulmonary edema, pleural effusion or confluent pulmonary opacity. Stable mediastinal contours. Osteopenia. Stable visualized osseous  structures. Negative visible bowel gas pattern. IMPRESSION: Stable pulmonary hyperinflation and  post treatment changes to the right chest. No acute cardiopulmonary abnormality. Electronically Signed   By: Genevie Ann M.D.   On: 05/16/2017 13:01    ASSESSMENT AND PLAN:   Active Problems:   COPD exacerbation ([place])  Craig [name]  is a 66 y.o. male with a known history of Stage III lung cancer, lymphoma and chemotherapy, COPD, diabetes and hypertension is presenting to the ED with a chief complaint of worsening of shortness of breath and cough. patient was given a ten-day course of doxycycline and 4 days of azithromycin with no clinical improvement of his symptoms  # Acute on chronic hypoxic respiratory failure secondary to COPD exacerbation and healthcare associated pneumonia  Solu-Medrol 60 mg IV every 6 hours, nebulizer treatments Cefepime, vancomycin IV. Patient has failed outpatient Z-Pak and doxycycline  #Healthcare associated pneumonia-clinically-failed 2 courses of outpatient antibiotics  get sputum culture and sensitivity if patient can provide a sample Provide cefepime and vancomycin. Check MRSA screening.  #History of lung cancer and lymphoma currently on oral chemotherapy Hold today's chemotherapy and consult oncology Dr. Rogue Bussing  # diabetes mellitus Check hemoglobin A1c Sliding scale insulin  #Essential hypertension Continue home meds Hyzaar   All the records are reviewed and case discussed with Care Management/Social Workerr. Management plans discussed with the patient, family and they are in agreement.  CODE STATUS: Full.  TOTAL TIME TAKING CARE OF THIS PATIENT: 35 minutes.    POSSIBLE D/C IN 1-2 DAYS, DEPENDING ON CLINICAL CONDITION.   Vaughan Basta M.D on 05/17/2017   Between 7am to 6pm - Pager - [phone]  After 6pm go to [email or web address] - password [email or web address] [facility]  Office  [phone]  CC: Primary care physician;  [name], MD  Note: This dictation was prepared with Dragon dictation along with smaller phrase technology. Any transcriptional errors that result from this process are unintentional.

## 2017-05-18 LAB — GLUCOSE, CAPILLARY
GLUCOSE-CAPILLARY: 264 mg/dL — AB (ref 65–99)
Glucose-Capillary: 209 mg/dL — ABNORMAL HIGH (ref 65–99)
Glucose-Capillary: 205 mg/dL — ABNORMAL HIGH (ref 65–99)
Glucose-Capillary: 90 mg/dL (ref 65–99)

## 2017-05-18 LAB — HEMOGLOBIN A1C
HEMOGLOBIN A1C: 6.7 % — AB (ref 4.8–5.6)
MEAN PLASMA GLUCOSE: 146 mg/dL

## 2017-05-18 MED ORDER — METHYLPREDNISOLONE SODIUM SUCC 125 MG IJ SOLR
60.0000 mg | Freq: Two times a day (BID) | INTRAMUSCULAR | Status: DC
  Administered 2017-05-19: 02:00:00 60 mg via INTRAVENOUS
  Filled 2017-05-18: qty 2

## 2017-05-18 MED ORDER — GLUCERNA SHAKE PO LIQD
237.0000 mL | Freq: Two times a day (BID) | ORAL | Status: DC
  Administered 2017-05-19: 237 mL via ORAL

## 2017-05-18 MED ORDER — HYDROCODONE-ACETAMINOPHEN 5-325 MG PO TABS
1.0000 | ORAL_TABLET | Freq: Four times a day (QID) | ORAL | Status: DC | PRN
Start: 2017-05-18 — End: 2017-05-19
  Administered 2017-05-18: 1 via ORAL
  Administered 2017-05-18 – 2017-05-19 (×2): 2 via ORAL
  Filled 2017-05-18 (×2): qty 2
  Filled 2017-05-18: qty 1

## 2017-05-18 NOTE — Progress Notes (Signed)
This is to inform Mr. Craig [name] is admitted to [facility] on 05/16/17 and still here as of today.  Ms.Craig [name] was here with him last both days ( 05/16/17 and 05/17/17) to visit him as per pt and staff.  Please call for any further questions.

## 2017-05-18 NOTE — Progress Notes (Signed)
Work note copied and given to [employer] by Dr Anselm Jungling

## 2017-05-18 NOTE — Progress Notes (Signed)
[facility] [place] NAME: Craig [name]    MR#:  [id number]  DATE OF BIRTH:  10/30/1950  SUBJECTIVE:  CHIEF COMPLAINT:   Chief Complaint  Patient presents with  . Respiratory Distress   On Chemo for lung cancer- had SOB for last 1-2 weeks,received ABx by Pulm in clinic - no help so came to ER. Feels little better today, still have wheezing. Have some cough.  REVIEW OF SYSTEMS:  CONSTITUTIONAL: No fever, positive for fatigue or weakness.  EYES: No blurred or double vision.  EARS, NOSE, AND THROAT: No tinnitus or ear pain.  RESPIRATORY: positive for cough, shortness of breath, wheezing ,no hemoptysis.  CARDIOVASCULAR: No chest pain, orthopnea, edema.  GASTROINTESTINAL: No nausea, vomiting, diarrhea or abdominal pain.  GENITOURINARY: No dysuria, hematuria.  ENDOCRINE: No polyuria, nocturia,  HEMATOLOGY: No anemia, easy bruising or bleeding SKIN: No rash or lesion. MUSCULOSKELETAL: No joint pain or arthritis.   NEUROLOGIC: No tingling, numbness, weakness.  PSYCHIATRY: No anxiety or depression.   ROS  DRUG ALLERGIES:   Allergies  Allergen Reactions  . Iodinated Diagnostic Agents Shortness Of Breath and Nausea And Vomiting    13-hour prep  . Nexium [Esomeprazole Magnesium] Other (See Comments)    headache    VITALS:  Blood pressure (!) 125/56, pulse 97, temperature 97.8 F (36.6 C), temperature source Oral, resp. rate 20, height 5\' 8"  (1.727 m), weight 61.2 kg (135 lb), SpO2 100 %.  PHYSICAL EXAMINATION:  GENERAL:  66 y.o.-year-old patient lying in the bed with no acute distress.  EYES: Pupils equal, round, reactive to light and accommodation. No scleral icterus. Extraocular muscles intact.  HEENT: Head atraumatic, normocephalic. Oropharynx and nasopharynx clear.  NECK:  Supple, no jugular venous distention. No thyroid enlargement, no tenderness.  LUNGS: Normal breath sounds bilaterally, bilateral coarse wheezing, and crepitation.  No use of accessory muscles of respiration.  CARDIOVASCULAR: S1, S2 normal. No murmurs, rubs, or gallops.  ABDOMEN: Soft, nontender, nondistended. Bowel sounds present. No organomegaly or mass.  EXTREMITIES: No pedal edema, cyanosis, or clubbing.  NEUROLOGIC: Cranial nerves II through XII are intact. Muscle strength 5/5 in all extremities. Sensation intact. Gait not checked.  PSYCHIATRIC: The patient is alert and oriented x 3.  SKIN: No obvious rash, lesion, or ulcer.   Physical Exam LABORATORY PANEL:   CBC  Recent Labs Lab 05/17/17 0311  WBC 4.7  HGB 9.1*  HCT 28.1*  PLT 146*   ------------------------------------------------------------------------------------------------------------------  Chemistries   Recent Labs Lab 05/17/17 0311  NA 138  K 4.9  CL 90*  CO2 40*  GLUCOSE 193*  BUN 23*  CREATININE 1.07  CALCIUM 8.5*  AST 23  ALT 12*  ALKPHOS 48  BILITOT 0.5   ------------------------------------------------------------------------------------------------------------------  Cardiac Enzymes  Recent Labs Lab 05/16/17 1226  TROPONINI <0.03   ------------------------------------------------------------------------------------------------------------------  RADIOLOGY:  No results found.  ASSESSMENT AND PLAN:   Active Problems:   COPD exacerbation ([place])  Craig [name]  is a 66 y.o. male with a known history of Stage III lung cancer, lymphoma and chemotherapy, COPD, diabetes and hypertension is presenting to the ED with a chief complaint of worsening of shortness of breath and cough. patient was given a ten-day course of doxycycline and 4 days of azithromycin with no clinical improvement of his symptoms  # Acute on chronic hypoxic respiratory failure secondary to COPD exacerbation and healthcare associated pneumonia  Solu-Medrol 60 mg IV every 6 hours, nebulizer treatments Cefepime, vancomycin IV. Patient has  failed outpatient Z-Pak and doxycycline    MRSA PCR negative, d/c vanc.  #Healthcare associated pneumonia-clinically-failed 2 courses of outpatient antibiotics  get sputum culture and sensitivity if patient can provide a sample Provide cefepime and vancomycin. Checked MRSA screening- negative.  #History of lung cancer and lymphoma currently on oral chemotherapy Held chemotherapy as per Dr. Rogue Bussing  # diabetes mellitus Check hemoglobin A1c Sliding scale insulin  #Essential hypertension Continue home meds Hyzaar   All the records are reviewed and case discussed with Care Management/Social Workerr. Management plans discussed with the patient, family and they are in agreement.  CODE STATUS: Full.  TOTAL TIME TAKING CARE OF THIS PATIENT: 35 minutes.  Still have some wheezing, will monitor in hospital tonight.  POSSIBLE D/C IN 1-2 DAYS, DEPENDING ON CLINICAL CONDITION.   Vaughan Basta M.D on 05/18/2017   Between 7am to 6pm - Pager - [phone]  After 6pm go to [email or web address] - password [email or web address] [facility]  Office  [phone]  CC: Primary care physician; [name], MD  Note: This dictation was prepared with Dragon dictation along with smaller phrase technology. Any transcriptional errors that result from this process are unintentional.

## 2017-05-19 LAB — IGG, IGA, IGM
IgA: 231 mg/dL (ref 61–437)
IgG (Immunoglobin G), Serum: 589 mg/dL — ABNORMAL LOW (ref 700–1600)
IgM, Serum: 73 mg/dL (ref 20–172)

## 2017-05-19 LAB — BASIC METABOLIC PANEL
ANION GAP: 8 (ref 5–15)
BUN: 33 mg/dL — ABNORMAL HIGH (ref 6–20)
CALCIUM: 8.9 mg/dL (ref 8.9–10.3)
CO2: 40 mmol/L — ABNORMAL HIGH (ref 22–32)
Chloride: 87 mmol/L — ABNORMAL LOW (ref 101–111)
Creatinine, Ser: 1.03 mg/dL (ref 0.61–1.24)
GLUCOSE: 230 mg/dL — AB (ref 65–99)
POTASSIUM: 4.9 mmol/L (ref 3.5–5.1)
Sodium: 135 mmol/L (ref 135–145)

## 2017-05-19 LAB — GLUCOSE, CAPILLARY: GLUCOSE-CAPILLARY: 219 mg/dL — AB (ref 65–99)

## 2017-05-19 MED ORDER — PREDNISONE 10 MG (21) PO TBPK: ORAL_TABLET | ORAL | 0 refills | Status: DC

## 2017-05-19 MED ORDER — AMOXICILLIN-POT CLAVULANATE 875-125 MG PO TABS: 1.0000 | ORAL_TABLET | Freq: Two times a day (BID) | ORAL | 0 refills | Status: AC

## 2017-05-19 MED ORDER — GUAIFENESIN ER 600 MG PO TB12: 600.0000 mg | ORAL_TABLET | Freq: Two times a day (BID) | ORAL | 0 refills | Status: AC

## 2017-05-19 MED ORDER — BUDESONIDE 0.25 MG/2ML IN SUSP: 0.2500 mg | Freq: Two times a day (BID) | RESPIRATORY_TRACT | 0 refills | Status: AC

## 2017-05-19 MED ORDER — DEXTROMETHORPHAN POLISTIREX ER 30 MG/5ML PO SUER: 30.0000 mg | Freq: Two times a day (BID) | ORAL | 0 refills | Status: DC

## 2017-05-19 NOTE — Discharge Summary (Signed)
[facility] [place] NAME: Craig [name]    MR#:  [id number]  DATE OF BIRTH:  [birth year]  DATE OF ADMISSION:  05/16/2017 ADMITTING PHYSICIAN: Nicholes Mango, MD  DATE OF DISCHARGE: 05/19/2017   PRIMARY CARE PHYSICIAN: [name], MD    ADMISSION DIAGNOSIS:  Healthcare-associated pneumonia [J18.9] COPD exacerbation ([place]) [J44.1] Acute respiratory failure with hypercapnia ([place]) [J96.02] Sepsis, due to unspecified organism ([place]) [A41.9]  DISCHARGE DIAGNOSIS:  Active Problems:   COPD exacerbation ([place])   SECONDARY DIAGNOSIS:   Past Medical History:  Diagnosis Date  . Asthma   . Cancer ([place])   . Colon cancer ([place])   . COPD (chronic obstructive pulmonary disease) ([place])   . Diabetes mellitus without complication ([place])   . Difficulty sleeping   . Dysrhythmia   . GERD (gastroesophageal reflux disease)   . Glaucoma   . History of bronchitis   . Hypertension   . Lung cancer ([place])    right upper lobe mass positive for adenocarcinoma  . Lymphoma (H[facility])    low grade  . Renal cancer ([place])   . Respiratory failure ([place]) 07/12/2010   acute  . Right renal mass   . Shortness of breath dyspnea   . Stroke ([place]) 1990'S   NO RESIDUAL PROBLEMS  . Supplemental oxygen dependent    USES 3 LITERS CONTINUOUSLY    HOSPITAL COURSE:   Craig [name] a 66 y.o. malewith a known history of Stage III lung cancer, lymphoma and chemotherapy, COPD, diabetes and hypertension is presenting to the ED with a chief complaint of worsening of shortness of breath and cough. patient was given a ten-day course of doxycycline and 4 days of azithromycin with no clinical improvement of his symptoms  # Acute on chronic hypoxic respiratory failure secondary to COPD exacerbation and healthcare associated pneumonia  Solu-Medrol 60 mg IV every 6 hours, nebulizer treatments Cefepime, vancomycin IV.Patient has failed outpatient Z-Pak and doxycycline   MRSA PCR  negative, d/c vanc.   Feeling better now- will give augmentin for 5 days.  he is on 3 ltr oxygen at home.  #Healthcare associated pneumonia-clinically-failed 2 courses of outpatient antibiotics  get sputum culture and sensitivity if patient can provide a sample Provide cefepime and vancomycin.Checked MRSA screening- negative.  #History of lung cancer and lymphoma currently on oral chemotherapy Held chemotherapy as per Dr. Rogue Bussing  # diabetes mellitus Check hemoglobin A1c Sliding scale insulin  #Essential hypertension Continue home meds Hyzaar   DISCHARGE CONDITIONS:   Stable.  CONSULTS OBTAINED:  Treatment Team:  Sindy Guadeloupe, MD  DRUG ALLERGIES:   Allergies  Allergen Reactions  . Iodinated Diagnostic Agents Shortness Of Breath and Nausea And Vomiting    13-hour prep  . Nexium [Esomeprazole Magnesium] Other (See Comments)    headache    DISCHARGE MEDICATIONS:   Current Discharge Medication List    START taking these medications   Details  amoxicillin-clavulanate (AUGMENTIN) 875-125 MG tablet Take 1 tablet by mouth 2 (two) times daily. Qty: 10 tablet, Refills: 0    budesonide (PULMICORT) 0.25 MG/2ML nebulizer solution Take 2 mLs (0.25 mg total) by nebulization 2 (two) times daily. Qty: 60 mL, Refills: 0    dextromethorphan (DELSYM) 30 MG/5ML liquid Take 5 mLs (30 mg total) by mouth 2 (two) times daily. Qty: 89 mL, Refills: 0    guaiFENesin (MUCINEX) 600 MG 12 hr tablet Take 1 tablet (600 mg total) by mouth 2 (two) times daily. Qty: 10 tablet,  Refills: 0    predniSONE (STERAPRED UNI-PAK 21 TAB) 10 MG (21) TBPK tablet Take 6 tabs first day, 5 tab on day 2, then 4 on day 3rd, 3 tabs on day 4th , 2 tab on day 5th, and 1 tab on 6th day. Qty: 21 tablet, Refills: 0      CONTINUE these medications which have NOT CHANGED   Details  albuterol (PROVENTIL HFA;VENTOLIN HFA) 108 (90 BASE) MCG/ACT inhaler Inhale 2 puffs into the lungs every 6 (six) hours as  needed for wheezing or shortness of breath.     albuterol (PROVENTIL) (2.5 MG/3ML) 0.083% nebulizer solution USE 1 VIAL IN NEBULIZER EVERY 3-4 HOURS AS NEEDED Qty: 75 mL, Refills: 3    budesonide-formoterol (SYMBICORT) 160-4.5 MCG/ACT inhaler Inhale 2 puffs into the lungs 2 (two) times daily.     diphenhydrAMINE (BENADRYL) 25 MG tablet Take 1 tablet (25 mg total) by mouth as needed for itching or allergies.   Associated Diagnoses: Clear cell renal cell carcinoma, right (HCC)    HYDROcodone-acetaminophen (NORCO/VICODIN) 5-325 MG tablet Take 1 tablet by mouth every 8 (eight) hours as needed for moderate pain. Please Note new directions Qty: 90 tablet, Refills: 0   Associated Diagnoses: Malignant neoplasm of left lung, unspecified part of lung ([place]); Renal cell cancer, right ([place]); Lymphoma, small lymphocytic (HCC)    losartan-hydrochlorothiazide (HYZAAR) 50-12.5 MG per tablet Take 1 tablet by mouth daily.    pioglitazone (ACTOS) 30 MG tablet Take 30 mg by mouth daily.    tiotropium (SPIRIVA) 18 MCG inhalation capsule Place 18 mcg into inhaler and inhale daily.     zolpidem (AMBIEN) 10 MG tablet Take 10 mg by mouth at bedtime.    dextromethorphan-guaiFENesin (MUCINEX DM) 30-600 MG 12hr tablet Take 1 tablet by mouth 2 (two) times daily. Qty: 16 tablet, Refills: 0    feeding supplement, ENSURE ENLIVE, (ENSURE ENLIVE) LIQD Take 237 mLs by mouth 2 (two) times daily between meals. Qty: 237 mL, Refills: 12      STOP taking these medications     Ibrutinib 420 MG TABS      predniSONE (DELTASONE) 10 MG tablet          DISCHARGE INSTRUCTIONS:    Follow with PMD in 1 week, have appointment with Pulm in 2 weeks,.  If you experience worsening of your admission symptoms, develop shortness of breath, life threatening emergency, suicidal or homicidal thoughts you must seek medical attention immediately by calling 911 or calling your MD immediately  if symptoms less severe.  You Must read  complete instructions/literature along with all the possible adverse reactions/side effects for all the Medicines you take and that have been prescribed to you. Take any new Medicines after you have completely understood and accept all the possible adverse reactions/side effects.   Please note  You were cared for by a hospitalist during your hospital stay. If you have any questions about your discharge medications or the care you received while you were in the hospital after you are discharged, you can call the unit and asked to speak with the hospitalist on call if the hospitalist that took care of you is not available. Once you are discharged, your primary care physician will handle any further medical issues. Please note that NO REFILLS for any discharge medications will be authorized once you are discharged, as it is imperative that you return to your primary care physician (or establish a relationship with a primary care physician if you do not have one)  for your aftercare needs so that they can reassess your need for medications and monitor your lab values.    Today   CHIEF COMPLAINT:   Chief Complaint  Patient presents with  . Respiratory Distress    HISTORY OF PRESENT ILLNESS:  Craig [name]  is a 66 y.o. male with a known history of Stage III lung cancer, lymphoma and chemotherapy, COPD, diabetes and hypertension is presenting to the ED with a chief complaint of worsening of shortness of breath and cough. patient was given a ten-day course of doxycycline and 4 days of azithromycin with no clinical improvement of his symptoms. Patient sees Dr. Raul Del pulmonology as an outpatient. Patient was wheezing at the time of presentation, he lives on 3 L of home oxygen patient was given IV Solu-Medrol and cefepime and hospitalist team is called for admission.   VITAL SIGNS:  Blood pressure (!) 116/58, pulse 98, temperature 98 F (36.7 C), temperature source Oral, resp. rate 20, height 5\' 8"   (1.727 m), weight 61.2 kg (135 lb), SpO2 96 %.  I/O:   Intake/Output Summary (Last 24 hours) at 05/19/17 1111 Last data filed at 05/19/17 0951  Gross per 24 hour  Intake              648 ml  Output                0 ml  Net              648 ml    PHYSICAL EXAMINATION:   GENERAL:  66 y.o.-year-old patient lying in the bed with no acute distress.  EYES: Pupils equal, round, reactive to light and accommodation. No scleral icterus. Extraocular muscles intact.  HEENT: Head atraumatic, normocephalic. Oropharynx and nasopharynx clear.  NECK:  Supple, no jugular venous distention. No thyroid enlargement, no tenderness.  LUNGS: Normal breath sounds bilaterally, bilateral coarse wheezing, and crepitation. No use of accessory muscles of respiration.  CARDIOVASCULAR: S1, S2 normal. No murmurs, rubs, or gallops.  ABDOMEN: Soft, nontender, nondistended. Bowel sounds present. No organomegaly or mass.  EXTREMITIES: No pedal edema, cyanosis, or clubbing.  NEUROLOGIC: Cranial nerves II through XII are intact. Muscle strength 5/5 in all extremities. Sensation intact. Gait not checked.  PSYCHIATRIC: The patient is alert and oriented x 3.  SKIN: No obvious rash, lesion, or ulcer.   DATA REVIEW:   CBC  Recent Labs Lab 05/17/17 0311  WBC 4.7  HGB 9.1*  HCT 28.1*  PLT 146*    Chemistries   Recent Labs Lab 05/17/17 0311 05/19/17 0524  NA 138 135  K 4.9 4.9  CL 90* 87*  CO2 40* 40*  GLUCOSE 193* 230*  BUN 23* 33*  CREATININE 1.07 1.03  CALCIUM 8.5* 8.9  AST 23  --   ALT 12*  --   ALKPHOS 48  --   BILITOT 0.5  --     Cardiac Enzymes  Recent Labs Lab 05/16/17 1226  TROPONINI <0.03    Microbiology Results  Results for orders placed or performed during the hospital encounter of 05/16/17  Culture, blood (Routine x 2)     Status: None (Preliminary result)   Collection Time: 05/16/17 12:26 PM  Result Value Ref Range Status   Specimen Description BLOOD RIGHT ASSIST CONTROL  Final    Special Requests   Final    BOTTLES DRAWN AEROBIC AND ANAEROBIC Blood Culture results may not be optimal due to an inadequate volume of blood received in culture bottles  Culture NO GROWTH 3 DAYS  Final   Report Status PENDING  Incomplete  Culture, blood (Routine x 2)     Status: None (Preliminary result)   Collection Time: 05/16/17 12:27 PM  Result Value Ref Range Status   Specimen Description BLOOD RIGHT AC  Final   Special Requests   Final    BOTTLES DRAWN AEROBIC AND ANAEROBIC Blood Culture results may not be optimal due to an inadequate volume of blood received in culture bottles   Culture NO GROWTH 3 DAYS  Final   Report Status PENDING  Incomplete  MRSA PCR Screening     Status: None   Collection Time: 05/17/17  2:49 PM  Result Value Ref Range Status   MRSA by PCR NEGATIVE NEGATIVE Final    Comment:        The GeneXpert MRSA Assay (FDA approved for NASAL specimens only), is one component of a comprehensive MRSA colonization surveillance program. It is not intended to diagnose MRSA infection nor to guide or monitor treatment for MRSA infections.     RADIOLOGY:  No results found.  EKG:   Orders placed or performed during the hospital encounter of 05/16/17  . ED EKG  . ED EKG      Management plans discussed with the patient, family and they are in agreement.  CODE STATUS:     Code Status Orders        Start     Ordered   05/16/17 1646  Full code  Continuous     05/16/17 1645    Code Status History    Date Active Date Inactive Code Status Order ID Comments User Context   02/28/2017 11:26 AM 03/02/2017  1:13 PM Full Code [id number]  [name], MD ED   01/30/2017  6:19 PM 02/01/2017  3:17 PM Full Code [id number]  Hillary Bow, MD ED   12/07/2016  4:43 PM 12/10/2016  8:50 PM Full Code [id number]  Bettey Costa, MD Inpatient   07/02/2016  3:01 PM 07/03/2016  3:55 PM DNR [id number]  Hillary Bow, MD ED   05/31/2016  6:48 PM 06/02/2016  3:41 PM DNR [id number]   Nicholes Mango, MD Inpatient   11/19/2015  7:53 PM 11/20/2015  2:12 PM Full Code [id number]  Idelle Crouch, MD Inpatient      TOTAL TIME TAKING CARE OF THIS PATIENT: 35 minutes.    Vaughan Basta M.D on 05/19/2017 at 11:11 AM  Between 7am to 6pm - Pager - [phone]  After 6pm go to [email or web address] - password [email or web address] [facility]  Office  [phone]  CC: Primary care physician; [name], MD   Note: This dictation was prepared with Dragon dictation along with smaller phrase technology. Any transcriptional errors that result from this process are unintentional.

## 2017-05-19 NOTE — Progress Notes (Signed)
MD order received in [facility] to discharge pt home today; verbally reviewed AVS with pt, gave written Rxs to pt; no questions voiced at this time; pt's discharge pending arrival of his ride home who will be bringing his clothes and portable O2 (pt on chronic 3L [place]).

## 2017-05-19 NOTE — Progress Notes (Signed)
Pt's ride present for discharge; pt discharged via wheelchair by nursing to the visitor's entrance on portable O2 at 3L [place]

## 2017-05-20 ENCOUNTER — Encounter: Payer: Self-pay | Admitting: *Deleted

## 2017-05-20 ENCOUNTER — Other Ambulatory Visit: Payer: Self-pay | Admitting: *Deleted

## 2017-05-20 NOTE — Patient Outreach (Addendum)
Successful telephone encounter to [name], 66 year old male for transition of care/recent hospitalization August 2-5,2018 for Health care associated pneumonia, COPD exacerbation, Acute respiratory failure with hypercapnia, Sepsis (4 admits in 6 months).  This RN CM followed pt for transition of care on last hospitalization 02/28/17- 03/02/17 for COPD exacerbation, acute respiratory failure, hypoxia. Spoke with pt, HIPAA identifiers provided.  Pt reports on recent hospitalization, was told resistance got down being on chemo (currently not taking), was on two  different antibiotics/did not work, had to go to hospital to get IV antibiotic.  Pt reports last report, no evidence of cancer.  Pt requested  RN CM call back tomorrow (complete transition of care), not feeling good.    Plan:  As requested by pt, plan to follow up again tomorrow- complete transition of care.              RN CM to update  Dr. Lovie Macadamia of plan to  follow pt for transition of care.    [name].   [facility]  [phone]

## 2017-05-21 ENCOUNTER — Encounter: Payer: Self-pay | Admitting: Radiology

## 2017-05-21 ENCOUNTER — Other Ambulatory Visit: Payer: Self-pay | Admitting: *Deleted

## 2017-05-21 LAB — CULTURE, BLOOD (ROUTINE X 2)
CULTURE: NO GROWTH
Culture: NO GROWTH

## 2017-05-21 NOTE — Patient Outreach (Signed)
Successful telephone encounter to Craig [name], 66 year old male, follow up ((per pt's request) from yesterday's call/complete transition of care- recent hospitalization August 2-5, 2018 for Health care associated pneumonia, COPD exacerbation, Acute respiratory failure with hypercapnia, Sepsis.  Pt had 4 admits in 6 months. This RN CM was following pt with [facility] CM services prior to hospitalization.  Spoke with pt, HIPAA identifiers provided.  Pt reports getting better slowly, appetite coming back, has all of his medications/taking as ordered.  Pt reports nebulizer treatment helping, only used rescue medication once since discharge.  Pt did not want to  review discharge medications at this time with RN CM.    Pt reports to see Dr. Raul Del in 1.5 weeks, saw PCP prior to hospitalization.  RN CM discussed with pt post discharge orders recommended following up again with PCP.    Plan: As discussed with pt, plan to follow up again next week- home visit (part of ongoing transition of care).   [name].   [facility]  [phone]

## 2017-05-27 ENCOUNTER — Other Ambulatory Visit: Payer: Self-pay | Admitting: *Deleted

## 2017-05-27 NOTE — Patient Outreach (Signed)
Successful telephone encounter to Craig [name], 66 year old male, follow up on [facility] discharge done  05/24/17 as informed by [name] today. Per [name]- had 2 red flags (pt reached 05/24/17- answered yes to lost interest in things, sad/hopeless/anxiious/empty),recent hospitalization August 2-5,2018 for Health care associated pneumonia, COPD exacerbation, Acute respiratory failure with  Hypercapnia, Sepsis.   Spoke with pt, HIPAA identifiers verified, discussed purpose of call - follow up on Emmi to which pt did remember  call.  Pt reports he did not feel good last week/recent hospital discharge/body drained/ just not able to do but today feel a little better.   RN CM reminded pt of home visit tomorrow to which pt was aware.   Plan:  As discussed with pt, plan to follow up again tomorrow- home visit.    [name].   [facility]  [phone]

## 2017-05-28 ENCOUNTER — Encounter: Payer: Self-pay | Admitting: *Deleted

## 2017-05-28 ENCOUNTER — Other Ambulatory Visit: Payer: Self-pay | Admitting: *Deleted

## 2017-05-28 NOTE — Patient Outreach (Signed)
[facility]) Care Management   05/28/2017  Craig [name]. [birth year] [id number]  Craig [name]. is an 66 y.o. male  Subjective:  Pt reports feels better, completed antibiotic, to see Dr. Raul Del 8/201/8.   Pt reports  To see cancer MD next week, will do labs, last report no evidence of cancer.   Pt reports not sleeping good, always had a problem, takes Ambien every night,not really helping.  Pt reports  Sugar this am was 127, been staying around there.   Objective:   Vitals:   05/28/17 1125  BP: 112/62  Pulse: (!) 107  Resp: 20  SpO2: 92%    ROS  Physical Exam  Constitutional: He is oriented to person, place, and time. He appears well-developed.  Thin frame.    Cardiovascular: Regular rhythm and normal heart sounds.   HR 107   Respiratory: Effort normal.  Congestion noted on expiration in all fields.    GI: Soft. Bowel sounds are normal.  Genitourinary: Penis normal.  Musculoskeletal: Normal range of motion. He exhibits no edema.  Remained sitting during home visit.   Neurological: He is alert and oriented to person, place, and time.  Skin: Skin is warm and dry.  Psychiatric: He has a normal mood and affect. His behavior is normal. Judgment and thought content normal.    Encounter Medications:   Outpatient Encounter Prescriptions as of 05/28/2017  Medication Sig Note  . albuterol (PROVENTIL HFA;VENTOLIN HFA) 108 (90 BASE) MCG/ACT inhaler Inhale 2 puffs into the lungs every 6 (six) hours as needed for wheezing or shortness of breath.    Marland Kitchen albuterol (PROVENTIL) (2.5 MG/3ML) 0.083% nebulizer solution USE 1 VIAL IN NEBULIZER EVERY 3-4 HOURS AS NEEDED   . budesonide (PULMICORT) 0.25 MG/2ML nebulizer solution Take 2 mLs (0.25 mg total) by nebulization 2 (two) times daily.   . budesonide-formoterol (SYMBICORT) 160-4.5 MCG/ACT inhaler Inhale 2 puffs into the lungs 2 (two) times daily.    Marland Kitchen dextromethorphan (DELSYM) 30 MG/5ML liquid Take 5 mLs (30 mg  total) by mouth 2 (two) times daily.   Marland Kitchen dextromethorphan-guaiFENesin (MUCINEX DM) 30-600 MG 12hr tablet Take 1 tablet by mouth 2 (two) times daily. (Patient not taking: Reported on 03/14/2017)   . diphenhydrAMINE (BENADRYL) 25 MG tablet Take 1 tablet (25 mg total) by mouth as needed for itching or allergies. 03/14/2017: As needed.   . feeding supplement, ENSURE ENLIVE, (ENSURE ENLIVE) LIQD Take 237 mLs by mouth 2 (two) times daily between meals. 03/14/2017: Once  A day   . guaiFENesin (MUCINEX) 600 MG 12 hr tablet Take 1 tablet (600 mg total) by mouth 2 (two) times daily.   Marland Kitchen HYDROcodone-acetaminophen (NORCO/VICODIN) 5-325 MG tablet Take 1 tablet by mouth every 8 (eight) hours as needed for moderate pain. Please Note new directions   . losartan-hydrochlorothiazide (HYZAAR) 50-12.5 MG per tablet Take 1 tablet by mouth daily.   . pioglitazone (ACTOS) 30 MG tablet Take 30 mg by mouth daily.   . predniSONE (STERAPRED UNI-PAK 21 TAB) 10 MG (21) TBPK tablet Take 6 tabs first day, 5 tab on day 2, then 4 on day 3rd, 3 tabs on day 4th , 2 tab on day 5th, and 1 tab on 6th day.   . tiotropium (SPIRIVA) 18 MCG inhalation capsule Place 18 mcg into inhaler and inhale daily.    Marland Kitchen zolpidem (AMBIEN) 10 MG tablet Take 10 mg by mouth at bedtime.    Facility-Administered Encounter Medications as of 05/28/2017  Medication  .  sodium chloride 0.9 % injection 10 mL  . sodium chloride 0.9 % injection 10 mL    Functional Status:   In your present state of health, do you have any difficulty performing the following activities: 05/28/2017 05/16/2017  Hearing? N N  Vision? N N  Difficulty concentrating or making decisions? N N  Walking or climbing stairs? Y Y  Dressing or bathing? Y Y  Doing errands, shopping? [name]  Preparing Food and eating ? Y -  Using the Toilet? N -  In the past six months, have you accidently leaked urine? N -  Do you have problems with loss of bowel control? N -  Managing your Medications? N -   Managing your Finances? N -  Housekeeping or managing your Housekeeping? Y -  Some recent data might be hidden    Fall/Depression Screening:    Fall Risk  05/28/2017 03/21/2017 03/14/2017  Falls in the past year? No (No Data) No  Comment - No new falls reported today -   PHQ 2/9 Scores 05/28/2017 03/14/2017 02/05/2017 07/20/2016  PHQ - 2 Score 1 0 0 0    Assessment:  Pleasant 66 year old gentleman, following for transition of care/recent    Hospitalization August 2-5, 2018 for Health care associated pneumonia, COPD exacerbation,   Acute respiratory failure with hypercapnia,sepsis (4 admits in 6 months).    COPD:  Congestion auscultated upon expiration in all fields.  O2 sat at rest with deep breaths   92% on continuous O 2 three liters [place], HR 107.   Per pt been using flutter valve several times    during the day- per pt helps.  Nutrition: moderate loss of appetite- reinforced with pt  to call [employer] about (meals post     Discharge- delivered to home).     Plan: As discussed with pt, plan to continue to follow for transition of care, follow up again           Next week telephonically.          Plan to send Dr. Lovie Macadamia 05/28/17 home visit encounter.    THN CM Care Plan Problem One     Most Recent Value  Care Plan Problem One  At risk for readmission related to recent hospitalization for Health care associated pneumonia,COPD exacerbation,Sepsis, ARF,4 admits in 6 months.   Role Documenting the Problem One  Care Management Coordinator  Care Plan for Problem One  Active  THN Long Term Goal   Pt would not readmit to the hospital in the next 31 days   THN Long Term Goal Start Date  05/20/17  Interventions for Problem One Long Term Goal  home visit done- reviewed with pt when to call MD, ongoing use of flutter valve   THN CM Short Term Goal #1   Pt would keep all MD appointments in the next 30 days   THN CM Short Term Goal #1 Start Date  05/21/17  Interventions for Short Term Goal #1   Discussed with pt following up with PCP, per pt called MD office- does not need to be seen/pt to f/u with Lung MD 06/03/17  THN CM Short Term Goal #2   Pt would take all medications as ordered for the next 30 days   THN CM Short Term Goal #2 Start Date  05/21/17  Interventions for Short Term Goal #2  Medications reveiwed, discussed ongoing adherence.      [name].   [place]  Management  [phone]

## 2017-05-29 ENCOUNTER — Other Ambulatory Visit: Payer: Self-pay | Admitting: *Deleted

## 2017-05-29 DIAGNOSIS — C3492 Malignant neoplasm of unspecified part of left bronchus or lung: Secondary | ICD-10-CM

## 2017-05-29 DIAGNOSIS — C641 Malignant neoplasm of right kidney, except renal pelvis: Secondary | ICD-10-CM

## 2017-05-29 DIAGNOSIS — C8300 Small cell B-cell lymphoma, unspecified site: Secondary | ICD-10-CM

## 2017-05-29 MED ORDER — HYDROCODONE-ACETAMINOPHEN 5-325 MG PO TABS: 1.0000 | ORAL_TABLET | Freq: Three times a day (TID) | ORAL | 0 refills | Status: DC | PRN

## 2017-05-29 NOTE — Telephone Encounter (Signed)
Patient requesting refill on Norco, He has no FU with Dr B, he was in hospital for his last appointment and is scheduled for IR Cryoablation on 06/27/17. Please advise

## 2017-05-30 ENCOUNTER — Telehealth: Payer: Self-pay | Admitting: *Deleted

## 2017-05-30 NOTE — Telephone Encounter (Signed)
Hospice form has been filled out and faxed.

## 2017-05-30 NOTE — Telephone Encounter (Signed)
Patient called requesting hospice services. Please send orders and demo if in agreement.

## 2017-05-30 NOTE — Telephone Encounter (Signed)
MD approved hospice service

## 2017-06-01 ENCOUNTER — Emergency Department: Payer: Medicare HMO

## 2017-06-01 ENCOUNTER — Inpatient Hospital Stay
Admission: EM | Admit: 2017-06-01 | Discharge: 2017-06-06 | DRG: 208 | Disposition: A | Discharge status: Hospice | Payer: Medicare HMO | Attending: Internal Medicine | Admitting: Internal Medicine

## 2017-06-01 DIAGNOSIS — Z8673 Personal history of transient ischemic attack (TIA), and cerebral infarction without residual deficits: Secondary | ICD-10-CM

## 2017-06-01 DIAGNOSIS — R0602 Shortness of breath: Secondary | ICD-10-CM

## 2017-06-01 DIAGNOSIS — C3411 Malignant neoplasm of upper lobe, right bronchus or lung: Secondary | ICD-10-CM | POA: Diagnosis present

## 2017-06-01 DIAGNOSIS — Z7984 Long term (current) use of oral hypoglycemic drugs: Secondary | ICD-10-CM

## 2017-06-01 DIAGNOSIS — D649 Anemia, unspecified: Secondary | ICD-10-CM | POA: Diagnosis present

## 2017-06-01 DIAGNOSIS — K219 Gastro-esophageal reflux disease without esophagitis: Secondary | ICD-10-CM | POA: Diagnosis present

## 2017-06-01 DIAGNOSIS — E119 Type 2 diabetes mellitus without complications: Secondary | ICD-10-CM | POA: Diagnosis present

## 2017-06-01 DIAGNOSIS — C771 Secondary and unspecified malignant neoplasm of intrathoracic lymph nodes: Secondary | ICD-10-CM | POA: Diagnosis present

## 2017-06-01 DIAGNOSIS — J441 Chronic obstructive pulmonary disease with (acute) exacerbation: Secondary | ICD-10-CM | POA: Diagnosis present

## 2017-06-01 DIAGNOSIS — Z87891 Personal history of nicotine dependence: Secondary | ICD-10-CM | POA: Diagnosis not present

## 2017-06-01 DIAGNOSIS — Z91041 Radiographic dye allergy status: Secondary | ICD-10-CM | POA: Diagnosis not present

## 2017-06-01 DIAGNOSIS — Z7401 Bed confinement status: Secondary | ICD-10-CM | POA: Diagnosis not present

## 2017-06-01 DIAGNOSIS — Z9981 Dependence on supplemental oxygen: Secondary | ICD-10-CM | POA: Diagnosis not present

## 2017-06-01 DIAGNOSIS — Z66 Do not resuscitate: Secondary | ICD-10-CM | POA: Diagnosis present

## 2017-06-01 DIAGNOSIS — Z515 Encounter for palliative care: Secondary | ICD-10-CM | POA: Diagnosis not present

## 2017-06-01 DIAGNOSIS — J9620 Acute and chronic respiratory failure, unspecified whether with hypoxia or hypercapnia: Secondary | ICD-10-CM | POA: Diagnosis not present

## 2017-06-01 DIAGNOSIS — J9601 Acute respiratory failure with hypoxia: Secondary | ICD-10-CM

## 2017-06-01 DIAGNOSIS — Z888 Allergy status to other drugs, medicaments and biological substances status: Secondary | ICD-10-CM

## 2017-06-01 DIAGNOSIS — J9621 Acute and chronic respiratory failure with hypoxia: Secondary | ICD-10-CM | POA: Diagnosis present

## 2017-06-01 DIAGNOSIS — Z7189 Other specified counseling: Secondary | ICD-10-CM | POA: Diagnosis not present

## 2017-06-01 DIAGNOSIS — Z79899 Other long term (current) drug therapy: Secondary | ICD-10-CM

## 2017-06-01 DIAGNOSIS — N179 Acute kidney failure, unspecified: Secondary | ICD-10-CM | POA: Diagnosis present

## 2017-06-01 DIAGNOSIS — J449 Chronic obstructive pulmonary disease, unspecified: Secondary | ICD-10-CM | POA: Diagnosis not present

## 2017-06-01 DIAGNOSIS — J189 Pneumonia, unspecified organism: Secondary | ICD-10-CM

## 2017-06-01 DIAGNOSIS — Z85528 Personal history of other malignant neoplasm of kidney: Secondary | ICD-10-CM | POA: Diagnosis not present

## 2017-06-01 DIAGNOSIS — J9602 Acute respiratory failure with hypercapnia: Secondary | ICD-10-CM

## 2017-06-01 DIAGNOSIS — J440 Chronic obstructive pulmonary disease with acute lower respiratory infection: Secondary | ICD-10-CM | POA: Diagnosis present

## 2017-06-01 DIAGNOSIS — C3490 Malignant neoplasm of unspecified part of unspecified bronchus or lung: Secondary | ICD-10-CM | POA: Diagnosis not present

## 2017-06-01 DIAGNOSIS — J9622 Acute and chronic respiratory failure with hypercapnia: Secondary | ICD-10-CM | POA: Diagnosis not present

## 2017-06-01 DIAGNOSIS — R0902 Hypoxemia: Secondary | ICD-10-CM

## 2017-06-01 DIAGNOSIS — I10 Essential (primary) hypertension: Secondary | ICD-10-CM | POA: Diagnosis present

## 2017-06-01 DIAGNOSIS — R0603 Acute respiratory distress: Secondary | ICD-10-CM

## 2017-06-01 DIAGNOSIS — E875 Hyperkalemia: Secondary | ICD-10-CM | POA: Diagnosis present

## 2017-06-01 DIAGNOSIS — Z85038 Personal history of other malignant neoplasm of large intestine: Secondary | ICD-10-CM

## 2017-06-01 LAB — CBC WITH DIFFERENTIAL/PLATELET
BASOS ABS: 0.1 10*3/uL (ref 0–0.1)
BASOS PCT: 1 %
EOS ABS: 0.3 10*3/uL (ref 0–0.7)
EOS PCT: 4 %
HCT: 30.9 % — ABNORMAL LOW (ref 40.0–52.0)
HEMOGLOBIN: 9.8 g/dL — AB (ref 13.0–18.0)
Lymphocytes Relative: 23 %
Lymphs Abs: 1.5 10*3/uL (ref 1.0–3.6)
MCH: 28.9 pg (ref 26.0–34.0)
MCHC: 31.6 g/dL — ABNORMAL LOW (ref 32.0–36.0)
MCV: 91.3 fL (ref 80.0–100.0)
Monocytes Absolute: 0.8 10*3/uL (ref 0.2–1.0)
Monocytes Relative: 11 %
NEUTROS PCT: 61 %
Neutro Abs: 4.1 10*3/uL (ref 1.4–6.5)
PLATELETS: 156 10*3/uL (ref 150–440)
RBC: 3.39 MIL/uL — AB (ref 4.40–5.90)
RDW: 14.2 % (ref 11.5–14.5)
WBC: 6.7 10*3/uL (ref 3.8–10.6)

## 2017-06-01 LAB — LACTIC ACID, PLASMA: LACTIC ACID, VENOUS: 1.3 mmol/L (ref 0.5–1.9)

## 2017-06-01 LAB — COMPREHENSIVE METABOLIC PANEL
ALT: 16 U/L — ABNORMAL LOW (ref 17–63)
ANION GAP: 8 (ref 5–15)
AST: 22 U/L (ref 15–41)
Albumin: 3.7 g/dL (ref 3.5–5.0)
Alkaline Phosphatase: 58 U/L (ref 38–126)
BUN: 18 mg/dL (ref 6–20)
CALCIUM: 8.8 mg/dL — AB (ref 8.9–10.3)
CHLORIDE: 88 mmol/L — AB (ref 101–111)
CO2: 40 mmol/L — AB (ref 22–32)
Creatinine, Ser: 1.15 mg/dL (ref 0.61–1.24)
GFR calc non Af Amer: >60 mL/min (ref >60)
Glucose, Bld: 168 mg/dL — ABNORMAL HIGH (ref 65–99)
Potassium: 4.6 mmol/L (ref 3.5–5.1)
SODIUM: 136 mmol/L (ref 135–145)
Total Bilirubin: 0.4 mg/dL (ref 0.3–1.2)
Total Protein: 6.5 g/dL (ref 6.5–8.1)

## 2017-06-01 LAB — BLOOD GAS, ARTERIAL
ACID-BASE EXCESS: 17.9 mmol/L — AB (ref 0.0–2.0)
Bicarbonate: 46.9 mmol/L — ABNORMAL HIGH (ref 20.0–28.0)
DELIVERY SYSTEMS: POSITIVE
Expiratory PAP: 8
FIO2: 0.35
Inspiratory PAP: 16
Mechanical Rate: 12
O2 SAT: 85.6 %
PCO2 ART: 87 mmHg — AB (ref 32.0–48.0)
PH ART: 7.34 — AB (ref 7.350–7.450)
PO2 ART: 54 mmHg — AB (ref 83.0–108.0)
Patient temperature: 37

## 2017-06-01 LAB — FIBRIN DERIVATIVES D-DIMER (ARMC ONLY): Fibrin derivatives D-dimer (ARMC): 861.34 — ABNORMAL HIGH (ref 0.00–499.00)

## 2017-06-01 LAB — TROPONIN I

## 2017-06-01 LAB — BRAIN NATRIURETIC PEPTIDE: B NATRIURETIC PEPTIDE 5: 37 pg/mL (ref 0.0–100.0)

## 2017-06-01 MED ORDER — LORAZEPAM 2 MG/ML IJ SOLN
1.0000 mg | Freq: Once | INTRAMUSCULAR | Status: AC
  Administered 2017-06-01: 1 mg via INTRAVENOUS

## 2017-06-01 MED ORDER — IPRATROPIUM-ALBUTEROL 0.5-2.5 (3) MG/3ML IN SOLN
3.0000 mL | Freq: Once | RESPIRATORY_TRACT | Status: AC
  Administered 2017-06-01: 3 mL via RESPIRATORY_TRACT

## 2017-06-01 MED ORDER — ALBUTEROL SULFATE (2.5 MG/3ML) 0.083% IN NEBU
2.5000 mg | INHALATION_SOLUTION | Freq: Once | RESPIRATORY_TRACT | Status: AC
  Administered 2017-06-01: 2.5 mg via RESPIRATORY_TRACT
  Filled 2017-06-01: qty 3

## 2017-06-01 MED ORDER — LORAZEPAM 2 MG/ML IJ SOLN
1.0000 mg | Freq: Once | INTRAMUSCULAR | Status: AC
  Administered 2017-06-01: 1 mg via INTRAVENOUS
  Filled 2017-06-01: qty 1

## 2017-06-01 MED ORDER — DIPHENHYDRAMINE HCL 50 MG/ML IJ SOLN
50.0000 mg | Freq: Once | INTRAMUSCULAR | Status: DC
  Filled 2017-06-01: qty 1

## 2017-06-01 MED ORDER — IPRATROPIUM-ALBUTEROL 0.5-2.5 (3) MG/3ML IN SOLN
RESPIRATORY_TRACT | Status: AC
  Administered 2017-06-01: 3 mL via RESPIRATORY_TRACT
  Filled 2017-06-01: qty 3

## 2017-06-01 MED ORDER — LORAZEPAM 2 MG/ML IJ SOLN
1.0000 mg | Freq: Once | INTRAMUSCULAR | Status: DC
Start: 2017-06-01 — End: 2017-06-01

## 2017-06-01 MED ORDER — METHYLPREDNISOLONE SODIUM SUCC 125 MG IJ SOLR
125.0000 mg | Freq: Once | INTRAMUSCULAR | Status: AC
  Administered 2017-06-01: 125 mg via INTRAVENOUS
  Filled 2017-06-01: qty 2

## 2017-06-01 MED ORDER — DIPHENHYDRAMINE HCL 25 MG PO CAPS: 50.0000 mg | ORAL_CAPSULE | Freq: Once | ORAL | Status: DC

## 2017-06-01 MED ORDER — HYDROCORTISONE NA SUCCINATE PF 250 MG IJ SOLR
200.0000 mg | Freq: Once | INTRAMUSCULAR | Status: DC
  Filled 2017-06-01: qty 200

## 2017-06-01 MED ORDER — HALOPERIDOL LACTATE 5 MG/ML IJ SOLN
5.0000 mg | Freq: Once | INTRAMUSCULAR | Status: AC
  Administered 2017-06-01: 5 mg via INTRAVENOUS
  Filled 2017-06-01: qty 1

## 2017-06-01 MED ORDER — IPRATROPIUM-ALBUTEROL 0.5-2.5 (3) MG/3ML IN SOLN
3.0000 mL | Freq: Once | RESPIRATORY_TRACT | Status: AC
  Administered 2017-06-01: 3 mL via RESPIRATORY_TRACT
  Filled 2017-06-01: qty 3

## 2017-06-01 NOTE — ED Notes (Signed)
Pt starting to complain and wanting to take off BIPAP and be put on nasal cannula. MD informed pt he was breathing too fast and needed the BIPAP. Pt becoming irritable and unable to calm down. Pt given medications for breathing by RT. RN at bedside and trying calm pt down. Pt currently labored breathing, RR-32, HR-129, O2-95%.

## 2017-06-01 NOTE — ED Provider Notes (Signed)
[facility] Emergency Department Provider Note   ____________________________________________   First MD Initiated Contact with Patient 06/01/17 2145     (approximate)  I have reviewed the triage vital signs and the nursing notes.   HISTORY  Chief Complaint Respiratory Distress  History limited by respiratory distress   HPI Craig [name]. is a 66 y.o. male who apparently has been short of breath for 3 days getting worse. He has stage IV lung cancer and was recently diagnosed with pneumonia started on antibiotics. Called EMS today with a arrived his CO2 was 11 it put him on BiPAP and it has gone up since then to 25-34. Mr. rate was very fast. He was short of breath coughing up pink sputum and is very sweaty. He has now running an O2 sat of 99 on BiPAP. He is not coughing his lungs are clear and side ultrasound shows no, comet tails.   Past Medical History:  Diagnosis Date  . Asthma   . Cancer ([place])   . Colon cancer ([place])   . COPD (chronic obstructive pulmonary disease) ([place])   . Diabetes mellitus without complication ([place])   . Difficulty sleeping   . Dysrhythmia   . GERD (gastroesophageal reflux disease)   . Glaucoma   . History of bronchitis   . Hypertension   . Lung cancer ([place])    right upper lobe mass positive for adenocarcinoma  . Lymphoma (HCC)    low grade  . Renal cancer ([place])   . Respiratory failure ([place]) 07/12/2010   acute  . Right renal mass   . Shortness of breath dyspnea   . Stroke ([place]) 1990'S   NO RESIDUAL PROBLEMS  . Supplemental oxygen dependent    USES 3 LITERS CONTINUOUSLY    Patient Active Problem List   Diagnosis Date Noted  . Protein-calorie malnutrition, severe 03/02/2017  . Sepsis ([place]) 12/07/2016  . Portacath in place 11/05/2016  . Cancer associated pain 08/22/2016  . Anemia in neoplastic disease 08/22/2016  . Tachycardia 08/22/2016  . 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) ([place])  08/22/2016  . Chronic bronchitis ([place]) 06/02/2016  . Chronic renal insufficiency 06/02/2016  . Benign essential HTN 06/02/2016  . Diabetes ([place]) 06/02/2016  . Primary cancer of right upper lobe of lung ([place]) 06/01/2016  . Renal carcinoma ([place]) 02/29/2016  . Cancer of lung ([place]) 12/21/2015  . COPD exacerbation ([place]) 11/19/2015  . Acute bronchitis 11/19/2015  . SOB (shortness of breath) 11/19/2015  . Acute respiratory distress 11/19/2015  . Glaucoma 04/15/2015  . 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 BP (high blood pressure) 03/02/2015  . Cannot sleep 03/02/2015  . Controlled type 2 diabetes mellitus without complication ([place]) 78/58/8502  . Lung cancer, upper lobe ([place]) 02/03/2015  . Renal cell cancer ([place]) 02/03/2015  . 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 IR GENERIC HISTORICAL  05/31/2016   IR RADIOLOGIST EVAL & MGMT 05/31/2016 Aletta Edouard, MD [facility] INTERV RAD    Prior to Admission medications   Medication Sig Start Date End Date Taking? Authorizing Provider  albuterol (PROVENTIL HFA;VENTOLIN HFA) 108 (90 BASE) MCG/ACT inhaler Inhale 2 puffs into the lungs every 6 (six) hours as needed for wheezing or shortness of breath.    Yes [provider]  albuterol (PROVENTIL) (2.5 MG/3ML) 0.083% nebulizer solution USE 1 VIAL IN NEBULIZER EVERY 3-4 HOURS AS NEEDED 03/12/17  Yes [name], MD  budesonide (PULMICORT)  0.25 MG/2ML nebulizer solution Take 2 mLs (0.25 mg total) by nebulization 2 (two) times daily. 05/19/17 06/03/17 Yes Vaughan Basta, MD  budesonide-formoterol (SYMBICORT) 160-4.5 MCG/ACT inhaler Inhale 2 puffs into the lungs 2 (two) times daily.    Yes [provider]  feeding supplement, ENSURE ENLIVE, (ENSURE ENLIVE) LIQD Take 237 mLs by mouth 2 (two) times daily between meals. 03/02/17  Yes Bettey Costa, MD  HYDROcodone-acetaminophen (NORCO/VICODIN) 5-325 MG tablet Take 1 tablet by mouth every  8 (eight) hours as needed for moderate pain. Please Note new directions 05/29/17  Yes [name], MD  losartan-hydrochlorothiazide (HYZAAR) 50-12.5 MG per tablet Take 1 tablet by mouth daily.   Yes [provider]  Pediatric Multivitamins-Iron (MULTIPLE VITAMINS-IRON PO) Take by mouth. One a day   Yes [provider]  pioglitazone (ACTOS) 30 MG tablet Take 30 mg by mouth daily.   Yes [provider]  tiotropium (SPIRIVA) 18 MCG inhalation capsule Place 18 mcg into inhaler and inhale daily.    Yes [provider]  zolpidem (AMBIEN) 10 MG tablet Take 10 mg by mouth at bedtime. 05/09/17  Yes [provider]  dextromethorphan (DELSYM) 30 MG/5ML liquid Take 5 mLs (30 mg total) by mouth 2 (two) times daily. Patient not taking: Reported on 05/28/2017 05/19/17   Vaughan Basta, MD  dextromethorphan-guaiFENesin [facility] DM) 30-600 MG 12hr tablet Take 1 tablet by mouth 2 (two) times daily. Patient not taking: Reported on 03/14/2017 02/01/17   Fritzi Mandes, MD  diphenhydrAMINE (BENADRYL) 25 MG tablet Take 1 tablet (25 mg total) by mouth as needed for itching or allergies. Patient not taking: Reported on 05/28/2017 03/02/17   Bettey Costa, MD  guaiFENesin (MUCINEX) 600 MG 12 hr tablet Take 1 tablet (600 mg total) by mouth 2 (two) times daily. Patient not taking: Reported on 05/28/2017 05/19/17   Vaughan Basta, MD  predniSONE (STERAPRED UNI-PAK 21 TAB) 10 MG (21) TBPK tablet Take 6 tabs first day, 5 tab on day 2, then 4 on day 3rd, 3 tabs on day 4th , 2 tab on day 5th, and 1 tab on 6th day. Patient not taking: Reported on 05/28/2017 05/19/17   Vaughan Basta, MD    Allergies Iodinated diagnostic agents and Nexium [esomeprazole magnesium]  Family History  Problem Relation Age of Onset  . Diabetes Mellitus II Mother   . Hypertension Mother   . Cancer Mother   . Diabetes Mother   . Tuberculosis Father   . Positive PPD/TB Exposure Father   .  Diabetes Mellitus II Sister   . Cancer Sister   . Sickle cell anemia Sister   . Prostate cancer Brother   . Cancer Brother   . Cancer Sister   . Cancer Maternal Aunt     Social History Social History  Substance Use Topics  . Smoking status: Former Smoker    Packs/day: 1.00    Years: 30.00    Types: Cigarettes    Start date: 12/28/1968    Quit date: 01/13/2010  . Smokeless tobacco: Never Used  . Alcohol use No     Comment: stopped 2002     Review of Systems Unable to obtain ______________   PHYSICAL EXAM:  VITAL SIGNS: ED Triage Vitals  Enc Vitals Group     BP 06/01/17 2143 (!) 125/99     Pulse Rate 06/01/17 2143 (!) 110     Resp 06/01/17 2143 (!) 28     Temp --      Temp Source  06/01/17 2143 Oral     SpO2 06/01/17 2143 99 %     Weight 06/01/17 2145 135 lb (61.2 kg)     Height 06/01/17 2145 5\' 8"  (1.727 m)     Head Circumference --      Peak Flow --      Pain Score --      Pain Loc --      Pain Edu? --      Excl. in [place]? --     Constitutional: Alert and oriented. In respiratory distress Eyes: Conjunctivae are normal.  Head: Atraumatic. Nose: No congestion/rhinnorhea. Mouth/Throat: Mucous membranes are moist.  Oropharynx non-erythematous. Neck: No stridor. There is JVD on the left side of his neck Cardiovascular: Normal rate, regular rhythm. Grossly normal heart sounds.  Good peripheral circulation. Respiratory: Normal respiratory effort.  No retractions. Lungs CTAB. Gastrointestinal: Soft and nontender. No distention. No abdominal bruits. No CVA tenderness. Musculoskeletal: No lower extremity tenderness nor edema.   Neurologic:  Normal speech and language. No gross focal neurologic deficits are appreciated.  Skin:  Skin is warm, dry and intact. No rash noted.   ____________________________________________   LABS (all labs ordered are listed, but only abnormal results are displayed)  Labs Reviewed  COMPREHENSIVE METABOLIC PANEL - Abnormal; Notable for the  following:       Result Value   Chloride 88 (*)    CO2 40 (*)    Glucose, Bld 168 (*)    Calcium 8.8 (*)    ALT 16 (*)    All other components within normal limits  CBC WITH DIFFERENTIAL/PLATELET - Abnormal; Notable for the following:    RBC 3.39 (*)    Hemoglobin 9.8 (*)    HCT 30.9 (*)    MCHC 31.6 (*)    All other components within normal limits  BLOOD GAS, ARTERIAL - Abnormal; Notable for the following:    pH, Arterial 7.34 (*)    pCO2 arterial 87 (*)    pO2, Arterial 54 (*)    Bicarbonate 46.9 (*)    Acid-Base Excess 17.9 (*)    All other components within normal limits  FIBRIN DERIVATIVES D-DIMER ([facility] ONLY) - Abnormal; Notable for the following:    Fibrin derivatives D-dimer ([facility]) 861.34 (*)    All other components within normal limits  TROPONIN I  BRAIN NATRIURETIC PEPTIDE  LACTIC ACID, PLASMA  LACTIC ACID, PLASMA   ____________________________________________  EKG  EKG read and interpreted by me shows sinus tach at a rate of 116 extreme right axis deviation. Poor R-wave progression and no acute ST-T wave changes probable right atrial enlargement ____________________________________________  RADIOLOGY  Chest x-ray seen by me shows some haziness on the right side in the middle lung field but no obvious infiltrate and no effusion although there is blunting of both angles the lungs are hyperexpanded ____________________________________________   PROCEDURES  Procedure(s) performed:   Procedures  Critical Care performed:   ____________________________________________   INITIAL IMPRESSION / ASSESSMENT AND PLAN / ED COURSE  Pertinent labs & imaging results that were available during my care of the patient were reviewed by me and considered in my medical decision making (see chart for details).    patient began speaking very well much more fluently instead of one or 2 words at a time and now I think therefore senses that time but is trying to rip his mask  off because he says it is making him worse. I can't do sedated with Ativan but that really doesn't  work very well also give him some Haldol as well. Blood gas come back showing hypercarbia and hypoxia will admit him and leave him on the BiPAP for now.   ____________________________________________   FINAL CLINICAL IMPRESSION(S) / ED DIAGNOSES  Final diagnoses:  Respiratory distress  COPD exacerbation (HCC)  Hypoxia  Shortness of breath  Acute respiratory failure with hypoxia and hypercapnia (HCC)      NEW MEDICATIONS STARTED DURING THIS VISIT:  New Prescriptions   No medications on file     Note:  This document was prepared using Dragon voice recognition software and may include unintentional dictation errors.    Nena Polio, MD 06/01/17 [phone]

## 2017-06-01 NOTE — ED Notes (Addendum)
Pt given warm blanket and seems to becoming more calm at this time.

## 2017-06-01 NOTE — ED Triage Notes (Signed)
Pt comes via [facility] from home with c/o difficulty breathing and SHOB. Pt recently dx with stage 4 lung cancer and pneumonia currently taking antibiotics. Per EMS pt was in bed in fetal position upon arrival. Pt put on CPAP with RR 28-32 X min, BP 138/94. Pt is A&OX4. Pt denies any pain at this time. Pt currently on BIPAP with 100% and RR 22.

## 2017-06-01 NOTE — ED Notes (Signed)
Pt's sister Dorian Heckle listed as emergency contact in pt's chart called for update on pt. Randall Hiss niece of pt information added to emergency contact list. Per Dorian Heckle it is ok to provide updates to niece if she calls.

## 2017-06-02 DIAGNOSIS — J9620 Acute and chronic respiratory failure, unspecified whether with hypoxia or hypercapnia: Secondary | ICD-10-CM | POA: Diagnosis present

## 2017-06-02 DIAGNOSIS — Z91041 Radiographic dye allergy status: Secondary | ICD-10-CM | POA: Diagnosis not present

## 2017-06-02 DIAGNOSIS — J440 Chronic obstructive pulmonary disease with acute lower respiratory infection: Secondary | ICD-10-CM | POA: Diagnosis present

## 2017-06-02 DIAGNOSIS — R0603 Acute respiratory distress: Secondary | ICD-10-CM | POA: Diagnosis not present

## 2017-06-02 DIAGNOSIS — J189 Pneumonia, unspecified organism: Secondary | ICD-10-CM | POA: Diagnosis present

## 2017-06-02 DIAGNOSIS — C771 Secondary and unspecified malignant neoplasm of intrathoracic lymph nodes: Secondary | ICD-10-CM | POA: Diagnosis present

## 2017-06-02 DIAGNOSIS — N179 Acute kidney failure, unspecified: Secondary | ICD-10-CM | POA: Diagnosis present

## 2017-06-02 DIAGNOSIS — C3411 Malignant neoplasm of upper lobe, right bronchus or lung: Secondary | ICD-10-CM | POA: Diagnosis present

## 2017-06-02 DIAGNOSIS — Z7189 Other specified counseling: Secondary | ICD-10-CM | POA: Diagnosis not present

## 2017-06-02 DIAGNOSIS — Z87891 Personal history of nicotine dependence: Secondary | ICD-10-CM | POA: Diagnosis not present

## 2017-06-02 DIAGNOSIS — Z515 Encounter for palliative care: Secondary | ICD-10-CM | POA: Diagnosis not present

## 2017-06-02 DIAGNOSIS — J9621 Acute and chronic respiratory failure with hypoxia: Secondary | ICD-10-CM | POA: Diagnosis present

## 2017-06-02 DIAGNOSIS — D649 Anemia, unspecified: Secondary | ICD-10-CM | POA: Diagnosis present

## 2017-06-02 DIAGNOSIS — K219 Gastro-esophageal reflux disease without esophagitis: Secondary | ICD-10-CM | POA: Diagnosis present

## 2017-06-02 DIAGNOSIS — Z85528 Personal history of other malignant neoplasm of kidney: Secondary | ICD-10-CM | POA: Diagnosis not present

## 2017-06-02 DIAGNOSIS — Z9981 Dependence on supplemental oxygen: Secondary | ICD-10-CM | POA: Diagnosis not present

## 2017-06-02 DIAGNOSIS — Z66 Do not resuscitate: Secondary | ICD-10-CM | POA: Diagnosis present

## 2017-06-02 DIAGNOSIS — E119 Type 2 diabetes mellitus without complications: Secondary | ICD-10-CM | POA: Diagnosis present

## 2017-06-02 DIAGNOSIS — Z79899 Other long term (current) drug therapy: Secondary | ICD-10-CM | POA: Diagnosis not present

## 2017-06-02 DIAGNOSIS — Z7984 Long term (current) use of oral hypoglycemic drugs: Secondary | ICD-10-CM | POA: Diagnosis not present

## 2017-06-02 DIAGNOSIS — Z8673 Personal history of transient ischemic attack (TIA), and cerebral infarction without residual deficits: Secondary | ICD-10-CM | POA: Diagnosis not present

## 2017-06-02 DIAGNOSIS — R0602 Shortness of breath: Secondary | ICD-10-CM | POA: Diagnosis not present

## 2017-06-02 DIAGNOSIS — J9602 Acute respiratory failure with hypercapnia: Secondary | ICD-10-CM | POA: Diagnosis not present

## 2017-06-02 DIAGNOSIS — J441 Chronic obstructive pulmonary disease with (acute) exacerbation: Secondary | ICD-10-CM | POA: Diagnosis present

## 2017-06-02 DIAGNOSIS — J9622 Acute and chronic respiratory failure with hypercapnia: Secondary | ICD-10-CM | POA: Diagnosis not present

## 2017-06-02 DIAGNOSIS — I10 Essential (primary) hypertension: Secondary | ICD-10-CM | POA: Diagnosis present

## 2017-06-02 DIAGNOSIS — J9601 Acute respiratory failure with hypoxia: Secondary | ICD-10-CM | POA: Diagnosis not present

## 2017-06-02 DIAGNOSIS — E875 Hyperkalemia: Secondary | ICD-10-CM | POA: Diagnosis present

## 2017-06-02 DIAGNOSIS — Z85038 Personal history of other malignant neoplasm of large intestine: Secondary | ICD-10-CM | POA: Diagnosis not present

## 2017-06-02 DIAGNOSIS — Z888 Allergy status to other drugs, medicaments and biological substances status: Secondary | ICD-10-CM | POA: Diagnosis not present

## 2017-06-02 LAB — BASIC METABOLIC PANEL
Anion gap: 10 (ref 5–15)
BUN: 34 mg/dL — AB (ref 6–20)
CHLORIDE: 89 mmol/L — AB (ref 101–111)
CO2: 34 mmol/L — AB (ref 22–32)
CREATININE: 1.99 mg/dL — AB (ref 0.61–1.24)
Calcium: 8 mg/dL — ABNORMAL LOW (ref 8.9–10.3)
GFR calc Af Amer: 39 mL/min — ABNORMAL LOW (ref >60)
GFR calc non Af Amer: 33 mL/min — ABNORMAL LOW (ref >60)
GLUCOSE: 132 mg/dL — AB (ref 65–99)
Potassium: 5.4 mmol/L — ABNORMAL HIGH (ref 3.5–5.1)
Sodium: 133 mmol/L — ABNORMAL LOW (ref 135–145)

## 2017-06-02 LAB — GLUCOSE, CAPILLARY
GLUCOSE-CAPILLARY: 226 mg/dL — AB (ref 65–99)
GLUCOSE-CAPILLARY: 181 mg/dL — AB (ref 65–99)
GLUCOSE-CAPILLARY: 64 mg/dL — AB (ref 65–99)
GLUCOSE-CAPILLARY: 242 mg/dL — AB (ref 65–99)
Glucose-Capillary: 207 mg/dL — ABNORMAL HIGH (ref 65–99)
Glucose-Capillary: 77 mg/dL (ref 65–99)
Glucose-Capillary: 100 mg/dL — ABNORMAL HIGH (ref 65–99)

## 2017-06-02 LAB — COMPREHENSIVE METABOLIC PANEL
ALK PHOS: 60 U/L (ref 38–126)
ALT: 24 U/L (ref 17–63)
ANION GAP: 9 (ref 5–15)
AST: 28 U/L (ref 15–41)
Albumin: 3.7 g/dL (ref 3.5–5.0)
BUN: 24 mg/dL — ABNORMAL HIGH (ref 6–20)
CALCIUM: 8.6 mg/dL — AB (ref 8.9–10.3)
CO2: 38 mmol/L — ABNORMAL HIGH (ref 22–32)
Chloride: 90 mmol/L — ABNORMAL LOW (ref 101–111)
Creatinine, Ser: 1.79 mg/dL — ABNORMAL HIGH (ref 0.61–1.24)
GFR, EST AFRICAN AMERICAN: 44 mL/min — AB (ref >60)
GFR, EST NON AFRICAN AMERICAN: 38 mL/min — AB (ref >60)
Glucose, Bld: 191 mg/dL — ABNORMAL HIGH (ref 65–99)
Potassium: 5.5 mmol/L — ABNORMAL HIGH (ref 3.5–5.1)
Sodium: 137 mmol/L (ref 135–145)
TOTAL PROTEIN: 6.7 g/dL (ref 6.5–8.1)
Total Bilirubin: 0.3 mg/dL (ref 0.3–1.2)

## 2017-06-02 LAB — MAGNESIUM: MAGNESIUM: 1.8 mg/dL (ref 1.7–2.4)

## 2017-06-02 LAB — CBC
HCT: 30.9 % — ABNORMAL LOW (ref 40.0–52.0)
HEMOGLOBIN: 9.9 g/dL — AB (ref 13.0–18.0)
MCH: 29.5 pg (ref 26.0–34.0)
MCHC: 32 g/dL (ref 32.0–36.0)
MCV: 92.4 fL (ref 80.0–100.0)
Platelets: 144 10*3/uL — ABNORMAL LOW (ref 150–440)
RBC: 3.34 MIL/uL — AB (ref 4.40–5.90)
RDW: 14.6 % — ABNORMAL HIGH (ref 11.5–14.5)
WBC: 9.3 10*3/uL (ref 3.8–10.6)

## 2017-06-02 LAB — MRSA PCR SCREENING: MRSA by PCR: NEGATIVE

## 2017-06-02 LAB — PROCALCITONIN: PROCALCITONIN: 0.15 ng/mL

## 2017-06-02 LAB — PHOSPHORUS: PHOSPHORUS: 4.6 mg/dL (ref 2.5–4.6)

## 2017-06-02 MED ORDER — MORPHINE SULFATE (PF) 2 MG/ML IV SOLN
2.0000 mg | INTRAVENOUS | Status: DC | PRN
  Administered 2017-06-02 (×3): 2 mg via INTRAVENOUS
  Administered 2017-06-02 – 2017-06-03 (×2): 4 mg via INTRAVENOUS
  Administered 2017-06-03 (×4): 2 mg via INTRAVENOUS
  Filled 2017-06-02 (×7): qty 1
  Filled 2017-06-02 (×2): qty 2

## 2017-06-02 MED ORDER — ONDANSETRON HCL 4 MG PO TABS: 4.0000 mg | ORAL_TABLET | Freq: Four times a day (QID) | ORAL | Status: DC | PRN

## 2017-06-02 MED ORDER — HYDROCHLOROTHIAZIDE 12.5 MG PO CAPS
12.5000 mg | ORAL_CAPSULE | Freq: Every day | ORAL | Status: DC
  Administered 2017-06-03: 12.5 mg via ORAL
  Filled 2017-06-02 (×2): qty 1

## 2017-06-02 MED ORDER — ACETAMINOPHEN 325 MG PO TABS: 650.0000 mg | ORAL_TABLET | Freq: Four times a day (QID) | ORAL | Status: DC | PRN

## 2017-06-02 MED ORDER — IPRATROPIUM BROMIDE 0.02 % IN SOLN: 0.5000 mg | Freq: Four times a day (QID) | RESPIRATORY_TRACT | Status: DC | PRN

## 2017-06-02 MED ORDER — BISACODYL 5 MG PO TBEC
5.0000 mg | DELAYED_RELEASE_TABLET | Freq: Every day | ORAL | Status: DC | PRN
  Administered 2017-06-04 – 2017-06-06 (×2): 5 mg via ORAL
  Filled 2017-06-02 (×2): qty 1

## 2017-06-02 MED ORDER — INSULIN ASPART 100 UNIT/ML ~~LOC~~ SOLN
0.0000 [IU] | SUBCUTANEOUS | Status: DC
  Administered 2017-06-02: 3 [IU] via SUBCUTANEOUS
  Administered 2017-06-02 (×2): 5 [IU] via SUBCUTANEOUS
  Administered 2017-06-03: 8 [IU] via SUBCUTANEOUS
  Administered 2017-06-03: 3 [IU] via SUBCUTANEOUS
  Administered 2017-06-03: 5 [IU] via SUBCUTANEOUS
  Administered 2017-06-03 – 2017-06-04 (×3): 3 [IU] via SUBCUTANEOUS
  Filled 2017-06-02 (×8): qty 1

## 2017-06-02 MED ORDER — SODIUM CHLORIDE 0.9 % IV BOLUS (SEPSIS)
1000.0000 mL | Freq: Once | INTRAVENOUS | Status: AC
  Administered 2017-06-02: 1000 mL via INTRAVENOUS

## 2017-06-02 MED ORDER — LOSARTAN POTASSIUM-HCTZ 50-12.5 MG PO TABS: 1.0000 | ORAL_TABLET | Freq: Every day | ORAL | Status: DC

## 2017-06-02 MED ORDER — ALBUTEROL SULFATE (2.5 MG/3ML) 0.083% IN NEBU: 2.5000 mg | INHALATION_SOLUTION | Freq: Four times a day (QID) | RESPIRATORY_TRACT | Status: DC | PRN

## 2017-06-02 MED ORDER — SENNOSIDES-DOCUSATE SODIUM 8.6-50 MG PO TABS
1.0000 | ORAL_TABLET | Freq: Every evening | ORAL | Status: DC | PRN
  Administered 2017-06-04: 1 via ORAL
  Filled 2017-06-02: qty 1

## 2017-06-02 MED ORDER — IPRATROPIUM-ALBUTEROL 0.5-2.5 (3) MG/3ML IN SOLN
3.0000 mL | RESPIRATORY_TRACT | Status: DC
  Administered 2017-06-02 – 2017-06-03 (×7): 3 mL via RESPIRATORY_TRACT
  Filled 2017-06-02 (×7): qty 3

## 2017-06-02 MED ORDER — BUDESONIDE 0.25 MG/2ML IN SUSP
0.2500 mg | Freq: Two times a day (BID) | RESPIRATORY_TRACT | Status: DC
  Administered 2017-06-02 – 2017-06-03 (×3): 0.25 mg via RESPIRATORY_TRACT
  Filled 2017-06-02 (×3): qty 2

## 2017-06-02 MED ORDER — ACETAMINOPHEN 650 MG RE SUPP: 650.0000 mg | Freq: Four times a day (QID) | RECTAL | Status: DC | PRN

## 2017-06-02 MED ORDER — HEPARIN SODIUM (PORCINE) 5000 UNIT/ML IJ SOLN
5000.0000 [IU] | Freq: Three times a day (TID) | INTRAMUSCULAR | Status: DC
  Administered 2017-06-03 – 2017-06-06 (×8): 5000 [IU] via SUBCUTANEOUS
  Filled 2017-06-02 (×9): qty 1

## 2017-06-02 MED ORDER — OXYCODONE HCL 5 MG PO TABS
5.0000 mg | ORAL_TABLET | ORAL | Status: DC | PRN
  Administered 2017-06-04: 17:00:00 5 mg via ORAL
  Filled 2017-06-02: qty 1

## 2017-06-02 MED ORDER — DEXTROSE 5 % IV SOLN
500.0000 mg | INTRAVENOUS | Status: DC
  Administered 2017-06-02: 500 mg via INTRAVENOUS
  Filled 2017-06-02 (×2): qty 500

## 2017-06-02 MED ORDER — SODIUM CHLORIDE 0.9 % IV SOLN
INTRAVENOUS | Status: DC
  Administered 2017-06-02 (×2): via INTRAVENOUS
  Administered 2017-06-02: 100 mL/h via INTRAVENOUS

## 2017-06-02 MED ORDER — ONDANSETRON HCL 4 MG/2ML IJ SOLN: 4.0000 mg | Freq: Four times a day (QID) | INTRAMUSCULAR | Status: DC | PRN

## 2017-06-02 MED ORDER — DEXTROSE 5 % IV SOLN
2.0000 g | Freq: Every day | INTRAVENOUS | Status: DC
  Administered 2017-06-02 – 2017-06-03 (×2): 2 g via INTRAVENOUS
  Filled 2017-06-02 (×3): qty 2

## 2017-06-02 MED ORDER — SODIUM CHLORIDE 0.9% FLUSH
3.0000 mL | Freq: Two times a day (BID) | INTRAVENOUS | Status: DC
  Administered 2017-06-02 – 2017-06-05 (×9): 3 mL via INTRAVENOUS

## 2017-06-02 MED ORDER — MAGNESIUM CITRATE PO SOLN
1.0000 | Freq: Once | ORAL | Status: DC | PRN
  Filled 2017-06-02: qty 296

## 2017-06-02 MED ORDER — ENSURE ENLIVE PO LIQD
1.0000 | Freq: Three times a day (TID) | ORAL | Status: DC
  Administered 2017-06-03 – 2017-06-06 (×9): 237 mL via ORAL

## 2017-06-02 MED ORDER — TIOTROPIUM BROMIDE MONOHYDRATE 18 MCG IN CAPS
18.0000 ug | ORAL_CAPSULE | Freq: Every day | RESPIRATORY_TRACT | Status: DC
  Administered 2017-06-03: 18 ug via RESPIRATORY_TRACT
  Filled 2017-06-02: qty 5

## 2017-06-02 MED ORDER — METHYLPREDNISOLONE SODIUM SUCC 125 MG IJ SOLR
60.0000 mg | Freq: Four times a day (QID) | INTRAMUSCULAR | Status: DC
  Administered 2017-06-02 – 2017-06-03 (×6): 60 mg via INTRAVENOUS
  Filled 2017-06-02 (×6): qty 2

## 2017-06-02 MED ORDER — MOMETASONE FURO-FORMOTEROL FUM 200-5 MCG/ACT IN AERO
2.0000 | INHALATION_SPRAY | Freq: Two times a day (BID) | RESPIRATORY_TRACT | Status: DC
  Administered 2017-06-03: 2 via RESPIRATORY_TRACT
  Filled 2017-06-02: qty 8.8

## 2017-06-02 MED ORDER — LOSARTAN POTASSIUM 50 MG PO TABS
50.0000 mg | ORAL_TABLET | Freq: Every day | ORAL | Status: DC
  Administered 2017-06-03: 50 mg via ORAL
  Filled 2017-06-02: qty 1

## 2017-06-02 MED ORDER — ENOXAPARIN SODIUM 60 MG/0.6ML ~~LOC~~ SOLN
60.0000 mg | Freq: Once | SUBCUTANEOUS | Status: AC
  Administered 2017-06-02: 60 mg via SUBCUTANEOUS
  Filled 2017-06-02: qty 0.6

## 2017-06-02 NOTE — Progress Notes (Signed)
Chaplain met family in hallway and family desired prayer at bedside. Prayer for comfort and assurance offered. Girlfriend had questions and was not getting information from [facility]. Katharine Look (sister) is POA and is enroute from out of state as is daughter Esmond Camper). Chaplain spoke to [place] by phone and offered education and prayer. Doctor arrived and provided family Katharine Look via phone) and girl friend education, emotional support and conversation regarding AD (DNR in place). Katharine Look desires for family to be in room one at a time and desires for treatment to be provided that is comforting. Chaplain offered education and support to girlfriend about being in room in quiet manner. Chaplain informed RN of availability for addition supported as requested.    06/02/17 0900  Clinical Encounter Type  Visited With Patient;Family;Health care provider  Visit Type Initial;Spiritual support;Critical Care  Spiritual Encounters  Spiritual Needs Prayer;Grief support;Emotional  Stress Factors  Patient Stress Factors Health changes  Family Stress Factors Family relationships;Loss of control  Advance Directives (For Healthcare)  Does Patient Have a Medical Advance Directive? Yes

## 2017-06-02 NOTE — Progress Notes (Signed)
Patient continues on BiPap. Tolerating well. Patient continues with SOB. Provider/Respiratory aware.  Niece at bedside. Code status to be clarified this morning. Currently patient is FC.  Report given to oncoming nurse.

## 2017-06-02 NOTE — ED Notes (Signed)
Pt Niece Sharyn Lull at bedside and updated on pt's condition. Admitting MD currently at bedside.

## 2017-06-02 NOTE — Progress Notes (Signed)
Pharmacy Antibiotic Note  Craig [name]. is a 66 y.o. male admitted on 06/01/2017 with pneumonia.  Pharmacy has been consulted for Cefepime dosing.  Plan: Pharmacy has been consulted for cefepime dosing. Due to the patient's renal function (CrCl 33.8 mL/min) and likely AKI, cefepime dose was initiated as 2g Q24H. Pharmacy will continue to follow and adjust as needed.   Height: 5\' 8"  (172.7 cm) Weight: 129 lb 10.1 oz (58.8 kg) IBW/kg (Calculated) : 68.4  Temp (24hrs), Avg:96.6 F (35.9 C), Min:96.6 F (35.9 C), Max:96.6 F (35.9 C)   Recent Labs Lab 06/01/17 2141 06/02/17 0419  WBC 6.7 9.3  CREATININE 1.15 1.79*  LATICACIDVEN 1.3  --     Estimated Creatinine Clearance: 33.8 mL/min (A) (by C-G formula based on SCr of 1.79 mg/dL (H)).    Allergies  Allergen Reactions  . Iodinated Diagnostic Agents Shortness Of Breath and Nausea And Vomiting    13-hour prep  . Nexium [Esomeprazole Magnesium] Other (See Comments)    headache    Antimicrobials this admission: Azithromycin 8/19 >> x1 dose Cefepime 8/19 >>   Dose adjustments this admission: Cefepime dose initiated as a renally adjustd dose of 2g Q24H.   Microbiology results:  Sputum 8/19 >>   MRSA PCR 8/19 >>  Thank you for allowing pharmacy to be a part of this patient's care.  Lendon Ka, PharmD Pharmacy Resident  06/02/2017 9:35 AM

## 2017-06-02 NOTE — ED Notes (Signed)
RT at bedside pt to transferred to ICU 4

## 2017-06-02 NOTE — Progress Notes (Signed)
Received report from offgoing nurse.  Patient on HFNC at this time. Patient had many visitors today. Anxious.  Patient current status is DNR.  Family members at bedside. Makes needs known.  Comfort provided.

## 2017-06-02 NOTE — ED Notes (Signed)
Pt currently resting, HR 117, O2 100%, RR 39

## 2017-06-02 NOTE — Progress Notes (Signed)
PT Cancellation Note  Patient Details Name: Craig [name]. MRN: [id number] DOB: [birth year]   Cancelled Treatment:    Reason Eval/Treat Not Completed: Medical issues which prohibited therapy. Patient currently with potassium value >5, and is not appropriate for mobility evaluation at this time. PT will continue to follow and re-attempt as able/appropriate.   Royce Macadamia PT, DPT, CSCS    06/02/2017, 4:18 PM

## 2017-06-02 NOTE — Progress Notes (Signed)
Patient placed on HFNC 55 liters @ 55%.  Patient currently states he is tolerating it ok.  Saturations 98% RR 26 HR 94.

## 2017-06-02 NOTE — Progress Notes (Signed)
This patient has received PRN morphine through this writer's shift for pain and comfort. This has relieved his anxiety, decreased his RR, and increased his tidal volume. SpO2 has remained 95-100% in BiPAP at 40%. Patient has become somewhat tired of wearing BiPAP over the last hour, asking to try something else, and pulling at mask. To avoid increased agitation and reverse progress made during this shift regarding his breathing, I spoke w/ Dr. Tamala Julian at [facility], who gave orders to try HFNC, while monitoring for somnolence, and is that becomes present, to obtain an ABG - monitor for increased hypercapnea, especially while giving morphine for pain and comfort. RT at bedside at this time setting up HFNC. BiPAP will be resumed if necessary.  Patient's sister, Katharine Look, is making decisions for the patient, per the patient and her. She has arrived from [place] approximately 2 hours ago.  Will continue to monitor this patient.

## 2017-06-02 NOTE — Progress Notes (Signed)
ANTICOAGULATION CONSULT NOTE - Initial Consult  Pharmacy Consult for Lovenox Indication: R/O PE  Allergies  Allergen Reactions  . Iodinated Diagnostic Agents Shortness Of Breath and Nausea And Vomiting    13-hour prep  . Nexium [Esomeprazole Magnesium] Other (See Comments)    headache    Patient Measurements: Height: 5\' 8"  (172.7 cm) Weight: 129 lb 10.1 oz (58.8 kg) IBW/kg (Calculated) : 68.4 Heparin Dosing Weight: 58.8 kg  Vital Signs: Temp: 96.6 F (35.9 C) (08/19 0200) Temp Source: Axillary (08/19 0200) BP: 92/60 (08/19 0300) Pulse Rate: 110 (08/19 0300)  Labs:  Recent Labs  06/01/17 2141  HGB 9.8*  HCT 30.9*  PLT 156  CREATININE 1.15  TROPONINI <0.03    Estimated Creatinine Clearance: 52.6 mL/min (by C-G formula based on SCr of 1.15 mg/dL).   Medical History: Past Medical History:  Diagnosis Date  . Asthma   . Cancer ([place])   . Colon cancer ([place])   . COPD (chronic obstructive pulmonary disease) ([place])   . Diabetes mellitus without complication ([place])   . Difficulty sleeping   . Dysrhythmia   . GERD (gastroesophageal reflux disease)   . Glaucoma   . History of bronchitis   . Hypertension   . Lung cancer ([place])    right upper lobe mass positive for adenocarcinoma  . Lymphoma ([facility])    low grade  . Renal cancer ([place])   . Respiratory failure ([place]) 07/12/2010   acute  . Right renal mass   . Shortness of breath dyspnea   . Stroke ([place]) 1990'S   NO RESIDUAL PROBLEMS  . Supplemental oxygen dependent    USES 3 LITERS CONTINUOUSLY    Medications:  Scheduled:  . budesonide  0.25 mg Nebulization BID  . diphenhydrAMINE  50 mg Intravenous Once  . enoxaparin (LOVENOX) injection  60 mg Subcutaneous Once  . losartan  50 mg Oral Daily   And  . hydrochlorothiazide  12.5 mg Oral Daily  . hydrocortisone sod succinate (SOLU-CORTEF) inj  200 mg Intravenous Once  . insulin aspart  0-15 Units Subcutaneous Q4H  . methylPREDNISolone (SOLU-MEDROL) injection  60 mg  Intravenous Q6H  . mometasone-formoterol  2 puff Inhalation BID  . sodium chloride flush  3 mL Intravenous Q12H  . tiotropium  18 mcg Inhalation Daily    Assessment: Patient admitted w/ respiratory distress w/ prior admit on 8/2 for sepsis s/t respiratory failure. Pt has h/o lung cancer. Giving one dose of lovenox until PE can be ruled out.  Goal of Therapy:   Monitor platelets by anticoagulation protocol: Yes   Plan:  Lovenox 60 mg subq x 1  Sirinity Outland 06/02/2017,3:53 AM

## 2017-06-02 NOTE — Progress Notes (Signed)
Admitted this morning for respiratory distress and COPD exacerbation. She now on BiPAP. Patient is DO NOT RESUSCITATE now. Has less wheezing and alert and appears comfortable. Has BiPAP on. Unable to cough the BiPAP. He has multiple medical problems of metastatic lung cancer, colon cancer, renal cell cancer. Physical examination: Patient alert, awake. Has BiPAP on. Cardiovascular system S1, S2 regular.  Lungs the bilateral expiratory wheeze in all lung fields.  Abdomen soft, nontender, nondistended .  Assessment and plan: 66 year old male with history of stage IV lung cancer, kidney cancer, colon cancer admitted because of COPD exacerbation.  #1 acute respiratory failure due to COPD exacerbation: Continue BiPAP, bronchodilators, IV steroids, antibiotics. Patient is DO NOT RESUSCITATE now.  #2 history of elevated d-dimer unable to get PE study. Continue full dose Lovenox.  #3. history of diabetes: Continue  RISS with coverage.  #4: Condition is critical and high risk for cardiac arrest. CODE STATUS is DO NOT RESUSCITATE. Time spent 25 minutes.

## 2017-06-02 NOTE — H&P (Signed)
History and Physical   [facility] - [place] @ [facility] Admission History and Physical [employer], D.O.    Patient Name: Craig [name] MR#: [id number] Date of Birth: Feb 15, 1951 Date of Admission: 06/01/2017  Referring MD/NP/PA: Dr. Cinda Quest Primary Care Physician: [name], MD  Chief Complaint:  Chief Complaint  Patient presents with  . Respiratory Distress  Please note the entire history is obtained from the patient's emergency department chart, emergency department provider and the patient's family who is at the bedside. Patient's personal history is limited by sedation.   HPI: Craig [name] is a 66 y.o. male with a known history of asthma/COPD, stage IV, lung ca, DM, GERD, HTN, CVA presents to the emergency department for evaluation of respiratory distress.  Patient was in a usual state of health until 3 days ago when he developed progressively worsening shortness of breath associated with cough productive of pinkish white sputum.   He was here on 05/16/17 with sepsis 2/2 HCAP, acute resp failure and COPD exac.  Otherwise there has been no change in status. Patient has been taking medication as prescribed and there has been no recent change in medication or diet.    EMS/ED Course: Patient received duonebd, Benadryl, Haldol, Solucortef, Ativan, Solumedrol and was placed on BiPAP. Medical admission has been requested for further management of acute respiratory failure secondary to COPD exacerbation  Review of Systems:  Unable to obtain 2/2 sedation   Past Medical History:  Diagnosis Date  . Asthma   . Cancer ([place])   . Colon cancer ([place])   . COPD (chronic obstructive pulmonary disease) ([place])   . Diabetes mellitus without complication ([place])   . Difficulty sleeping   . Dysrhythmia   . GERD (gastroesophageal reflux disease)   . Glaucoma   . History of bronchitis   . Hypertension   . Lung cancer ([place])    right upper lobe mass positive for adenocarcinoma  .  Lymphoma ([facility])    low grade  . Renal cancer ([place])   . Respiratory failure ([place]) 07/12/2010   acute  . Right renal mass   . Shortness of breath dyspnea   . Stroke ([place]) 1990'S   NO RESIDUAL PROBLEMS  . Supplemental oxygen dependent    USES 3 LITERS CONTINUOUSLY    Past Surgical History:  Procedure Laterality Date  . IR GENERIC HISTORICAL  05/31/2016   IR RADIOLOGIST EVAL & MGMT 05/31/2016 Aletta Edouard, MD [facility] INTERV RAD     reports that he quit smoking about 7 years ago. His smoking use included Cigarettes. He started smoking about 48 years ago. He has a 30.00 pack-year smoking history. He has never used smokeless tobacco. He reports that he does not drink alcohol or use drugs.  Allergies  Allergen Reactions  . Iodinated Diagnostic Agents Shortness Of Breath and Nausea And Vomiting    13-hour prep  . Nexium [Esomeprazole Magnesium] Other (See Comments)    headache    Family History  Problem Relation Age of Onset  . Diabetes Mellitus II Mother   . Hypertension Mother   . Cancer Mother   . Diabetes Mother   . Tuberculosis Father   . Positive PPD/TB Exposure Father   . Diabetes Mellitus II Sister   . Cancer Sister   . Sickle cell anemia Sister   . Prostate cancer Brother   . Cancer Brother   . Cancer Sister   . Cancer Maternal Aunt     Prior to Admission medications   Medication  Sig Start Date End Date Taking? Authorizing Provider  albuterol (PROVENTIL HFA;VENTOLIN HFA) 108 (90 BASE) MCG/ACT inhaler Inhale 2 puffs into the lungs every 6 (six) hours as needed for wheezing or shortness of breath.    Yes [provider]  albuterol (PROVENTIL) (2.5 MG/3ML) 0.083% nebulizer solution USE 1 VIAL IN NEBULIZER EVERY 3-4 HOURS AS NEEDED 03/12/17  Yes Charlaine Dalton R, MD  budesonide (PULMICORT) 0.25 MG/2ML nebulizer solution Take 2 mLs (0.25 mg total) by nebulization 2 (two) times daily. 05/19/17 06/03/17 Yes Vaughan Basta, MD  budesonide-formoterol  (SYMBICORT) 160-4.5 MCG/ACT inhaler Inhale 2 puffs into the lungs 2 (two) times daily.    Yes [provider]  feeding supplement, ENSURE ENLIVE, (ENSURE ENLIVE) LIQD Take 237 mLs by mouth 2 (two) times daily between meals. 03/02/17  Yes Bettey Costa, MD  HYDROcodone-acetaminophen (NORCO/VICODIN) 5-325 MG tablet Take 1 tablet by mouth every 8 (eight) hours as needed for moderate pain. Please Note new directions 05/29/17  Yes [name], MD  losartan-hydrochlorothiazide (HYZAAR) 50-12.5 MG per tablet Take 1 tablet by mouth daily.   Yes [provider]  Pediatric Multivitamins-Iron (MULTIPLE VITAMINS-IRON PO) Take by mouth. One a day   Yes [provider]  pioglitazone (ACTOS) 30 MG tablet Take 30 mg by mouth daily.   Yes [provider]  tiotropium (SPIRIVA) 18 MCG inhalation capsule Place 18 mcg into inhaler and inhale daily.    Yes [provider]  zolpidem (AMBIEN) 10 MG tablet Take 10 mg by mouth at bedtime. 05/09/17  Yes [provider]  dextromethorphan (DELSYM) 30 MG/5ML liquid Take 5 mLs (30 mg total) by mouth 2 (two) times daily. Patient not taking: Reported on 05/28/2017 05/19/17   Vaughan Basta, MD  dextromethorphan-guaiFENesin [facility] DM) 30-600 MG 12hr tablet Take 1 tablet by mouth 2 (two) times daily. Patient not taking: Reported on 03/14/2017 02/01/17   Fritzi Mandes, MD  diphenhydrAMINE (BENADRYL) 25 MG tablet Take 1 tablet (25 mg total) by mouth as needed for itching or allergies. Patient not taking: Reported on 05/28/2017 03/02/17   Bettey Costa, MD  guaiFENesin (MUCINEX) 600 MG 12 hr tablet Take 1 tablet (600 mg total) by mouth 2 (two) times daily. Patient not taking: Reported on 05/28/2017 05/19/17   Vaughan Basta, MD  predniSONE (STERAPRED UNI-PAK 21 TAB) 10 MG (21) TBPK tablet Take 6 tabs first day, 5 tab on day 2, then 4 on day 3rd, 3 tabs on day 4th , 2 tab on day 5th, and 1 tab on 6th day. Patient not taking:  Reported on 05/28/2017 05/19/17   Vaughan Basta, MD    Physical Exam: Vitals:   06/02/17 0030 06/02/17 0033 06/02/17 0036 06/02/17 0037  BP: 99/73     Pulse: (!) 117  (!) 115   Resp: (!) 34 (!) 45 (!) 41 (!) 34  TempSrc:      SpO2:      Weight:      Height:        GENERAL: 66 y.o.-year-old chronically ill appearing black male patient, well-developed, well-nourished lying in the bed, resting comfortably on BiPAP HEENT: Head atraumatic, normocephalic. Pupils equal. Mucus membranes moist. NECK: Supple, full range of motion. No JVD, no bruit heard. No thyroid enlargement, no tenderness, no cervical lymphadenopathy. CHEST: Diminished bibasilar breath sounds bilaterally. No wheezing, rales, rhonchi or crackles. No use of accessory muscles of respiration.  No reproducible chest wall tenderness.  CARDIOVASCULAR: S1, S2 normal. No murmurs, rubs, or gallops. Cap refill <2  seconds. Pulses intact distally.  ABDOMEN: Soft, nondistended, nontender. No rebound, guarding, rigidity. Normoactive bowel sounds present in all four quadrants.  EXTREMITIES: No pedal edema, cyanosis, or clubbing. No calf tenderness or Homan's sign.  NEUROLOGIC: The patient arouses to painful stimuli, cannot comply with exam.   SKIN: Warm, dry, and intact without obvious rash, lesion, or ulcer.    Labs on Admission:  CBC:  Recent Labs Lab 06/01/17 2141  WBC 6.7  NEUTROABS 4.1  HGB 9.8*  HCT 30.9*  MCV 91.3  PLT 458   Basic Metabolic Panel:  Recent Labs Lab 06/01/17 2141  NA 136  K 4.6  CL 88*  CO2 40*  GLUCOSE 168*  BUN 18  CREATININE 1.15  CALCIUM 8.8*   GFR: Estimated Creatinine Clearance: 54.7 mL/min (by C-G formula based on SCr of 1.15 mg/dL). Liver Function Tests:  Recent Labs Lab 06/01/17 2141  AST 22  ALT 16*  ALKPHOS 58  BILITOT 0.4  PROT 6.5  ALBUMIN 3.7   No results for input(s): LIPASE, AMYLASE in the last 168 hours. No results for input(s): AMMONIA in the last 168  hours. Coagulation Profile: No results for input(s): INR, PROTIME in the last 168 hours. Cardiac Enzymes:  Recent Labs Lab 06/01/17 2141  TROPONINI <0.03   BNP (last 3 results) No results for input(s): PROBNP in the last 8760 hours. HbA1C: No results for input(s): HGBA1C in the last 72 hours. CBG: No results for input(s): GLUCAP in the last 168 hours. Lipid Profile: No results for input(s): CHOL, HDL, LDLCALC, TRIG, CHOLHDL, LDLDIRECT in the last 72 hours. Thyroid Function Tests: No results for input(s): TSH, T4TOTAL, FREET4, T3FREE, THYROIDAB in the last 72 hours. Anemia Panel: No results for input(s): VITAMINB12, FOLATE, FERRITIN, TIBC, IRON, RETICCTPCT in the last 72 hours. Urine analysis:    Component Value Date/Time   COLORURINE YELLOW (A) 05/16/2017 1530   APPEARANCEUR CLEAR (A) 05/16/2017 1530   APPEARANCEUR Clear 12/21/2015 1333   LABSPEC 1.011 05/16/2017 1530   LABSPEC 1.013 08/26/2014 1840   PHURINE 6.0 05/16/2017 1530   GLUCOSEU NEGATIVE 05/16/2017 1530   GLUCOSEU Negative 08/26/2014 1840   HGBUR NEGATIVE 05/16/2017 1530   BILIRUBINUR NEGATIVE 05/16/2017 1530   BILIRUBINUR Negative 12/21/2015 1333   BILIRUBINUR Negative 08/26/2014 [place] 05/16/2017 1530   PROTEINUR NEGATIVE 05/16/2017 1530   NITRITE NEGATIVE 05/16/2017 1530   LEUKOCYTESUR NEGATIVE 05/16/2017 1530   LEUKOCYTESUR Negative 12/21/2015 1333   LEUKOCYTESUR Negative 08/26/2014 1840   Sepsis Labs: @LABRCNTIP (procalcitonin:4,lacticidven:4) )No results found for this or any previous visit (from the past 240 hour(s)).   Radiological Exams on Admission: Dg Chest Port 1 View  Result Date: 06/01/2017 CLINICAL DATA:  Respiratory distress EXAM: PORTABLE CHEST 1 VIEW COMPARISON:  May 16, 2017 FINDINGS: Lungs are hyperexpanded, stable. There is focal scarring in the right apex and right perihilar regions. There is also mild bibasilar scarring, stable. There is no appreciable edema or  consolidation. The heart size is normal. Pulmonary vascularity is within normal limits and stable. Port-A-Cath tip is in the superior vena cava. No pneumothorax. No appreciable adenopathy. There old healed rib fractures bilaterally with remodeling. IMPRESSION: Areas of scarring and hyperinflation. No edema or consolidation. Stable cardiac silhouette. No pneumothorax. Electronically Signed   By: Lowella Grip III M.D.   On: 06/01/2017 21:58    EKG: Sinus tachycardia 116 bpm with rightward axis and nonspecific ST-T wave changes.   Assessment/Plan  This is a 66 y.o. male with a history  of asthma/COPD, stage IV, lung ca, DM, GERD, HTN, CVA now being admitted with:  #. Acute respiratory failure 2/2 COPD exacerbation - Admit inpatient, stepdown, continuous pulse ox - Continue BiPAP, NPO - IV steroids and azithromycin - Nebulizers, O2 therapy and expectorants as needed.  - Continue Pulmicort, Symbicort, Spiriva - CCM consult  #. Elevated D-Dimer rule out PE - Will need V/Q scan as patient has severe allergy to contrast dye.  - Will give weight based dose of Lovenox given risk factors  #. History of anemia, chronic and stable - Continue monitor CBC  #. History of Diabetes - Accuchecks achs with RISS coverage - Heart healthy, carb controlled diet  #. History of HTN - Continue Hyzaar  Admission status: Inpatient, stepdwon IV Fluids: NS Diet/Nutrition: NPO Consults called: CCM  DVT Px: Lovenox, SCDs and early ambulation. Code Status: Full Code  Disposition Plan: To home in 2-3 days  All the records are reviewed and case discussed with ED provider. Management plans discussed with the patient and/or family who express understanding and agree with plan of care.  Zephan Beauchaine D.O. on 06/02/2017 at 12:50 AM Between 7am to 6pm - Pager - [phone] After 6pm go to [email or web address] - [employer] [facility] [phone] CC: Primary care  physician; [name], MD   06/02/2017, 12:50 AM

## 2017-06-02 NOTE — Consult Note (Signed)
PULMONARY / CRITICAL CARE MEDICINE   Name: Craig [name]. MRN: [id number] DOB: Jul 10, 1951    ADMISSION DATE:  06/01/2017   CONSULTATION DATE:  06/02/17  REFERRING MD:  Dr. Dia Sitter  Reason: Acute respiratory failure  CHIEF COMPLAINT:  Worsening dyspnea  HISTORY OF PRESENT ILLNESS:   This is a 66 year old African-American male with a past medical history as indicated below. End stage IV lung cancer who is currently diagnosed with pneumonia and being treated with antibiotics on an outpatient basis, presenting with worsening dyspnea. Symptoms started 3 days ago and has progressively gotten worse. Hence, patient called EMS. Upon EMS arrival, patient was severely tachypneic and hypoxic, hence, he was placed on BiPAP. He remains tachypneic, with hypoxemic episodes with minimal exertion. Dyspnea is associated with a productive cough with thick pinkish sputum. No reported fever. Patient got very agitated in the ED and tried taking off his BiPAP. He was given a dose of lorazepam and Haldol. He awakens to voice, but does not follow commands. Patient was recently discharged from the hospital on the [facility] of August 2018 after hospitalization for pneumonia, COPD exacerbation, acute respiratory failure and sepsis. His last office visit with Oncology was on the 5th of July 2018. It is unclear if patient is currently on any chemotherapeutic agents as there is none on his medication list.  PAST MEDICAL HISTORY :  He  has a past medical history of Asthma; Cancer ([place]); Colon cancer ([place]); COPD (chronic obstructive pulmonary disease) ([place]); Diabetes mellitus without complication ([place]); Difficulty sleeping; Dysrhythmia; GERD (gastroesophageal reflux disease); Glaucoma; History of bronchitis; Hypertension; Lung cancer ([place]); Lymphoma ([place]); Renal cancer ([place]); Respiratory failure ([place]) (07/12/2010); Right renal mass; Shortness of breath dyspnea; Stroke ([place]) (1990'S); and Supplemental oxygen dependent.  PAST  SURGICAL HISTORY: He  has a past surgical history that includes ir generic historical (05/31/2016).  Allergies  Allergen Reactions  . Iodinated Diagnostic Agents Shortness Of Breath and Nausea And Vomiting    13-hour prep  . Nexium [Esomeprazole Magnesium] Other (See Comments)    headache    Current Facility-Administered Medications on File Prior to Encounter  Medication  . sodium chloride 0.9 % injection 10 mL  . sodium chloride 0.9 % injection 10 mL   Current Outpatient Prescriptions on File Prior to Encounter  Medication Sig  . budesonide-formoterol (SYMBICORT) 160-4.5 MCG/ACT inhaler Inhale 2 puffs into the lungs 2 (two) times daily.   . feeding supplement, ENSURE ENLIVE, (ENSURE ENLIVE) LIQD Take 237 mLs by mouth 2 (two) times daily between meals.  Marland Kitchen HYDROcodone-acetaminophen (NORCO/VICODIN) 5-325 MG tablet Take 1 tablet by mouth every 8 (eight) hours as needed for moderate pain. Please Note new directions  . losartan-hydrochlorothiazide (HYZAAR) 50-12.5 MG per tablet Take 1 tablet by mouth daily.  . Pediatric Multivitamins-Iron (MULTIPLE VITAMINS-IRON PO) Take by mouth. One a day  . pioglitazone (ACTOS) 30 MG tablet Take 30 mg by mouth daily.  Marland Kitchen tiotropium (SPIRIVA) 18 MCG inhalation capsule Place 18 mcg into inhaler and inhale daily.   Marland Kitchen zolpidem (AMBIEN) 10 MG tablet Take 10 mg by mouth at bedtime.  Marland Kitchen albuterol (PROVENTIL HFA;VENTOLIN HFA) 108 (90 BASE) MCG/ACT inhaler Inhale 2 puffs into the lungs every 6 (six) hours as needed for wheezing or shortness of breath.   Marland Kitchen albuterol (PROVENTIL) (2.5 MG/3ML) 0.083% nebulizer solution USE 1 VIAL IN NEBULIZER EVERY 3-4 HOURS AS NEEDED  . budesonide (PULMICORT) 0.25 MG/2ML nebulizer solution Take 2 mLs (0.25 mg total) by nebulization 2 (two) times daily.  Marland Kitchen  dextromethorphan (DELSYM) 30 MG/5ML liquid Take 5 mLs (30 mg total) by mouth 2 (two) times daily. (Patient not taking: Reported on 05/28/2017)  . dextromethorphan-guaiFENesin (MUCINEX  DM) 30-600 MG 12hr tablet Take 1 tablet by mouth 2 (two) times daily. (Patient not taking: Reported on 03/14/2017)  . diphenhydrAMINE (BENADRYL) 25 MG tablet Take 1 tablet (25 mg total) by mouth as needed for itching or allergies. (Patient not taking: Reported on 05/28/2017)  . guaiFENesin (MUCINEX) 600 MG 12 hr tablet Take 1 tablet (600 mg total) by mouth 2 (two) times daily. (Patient not taking: Reported on 05/28/2017)  . predniSONE (STERAPRED UNI-PAK 21 TAB) 10 MG (21) TBPK tablet Take 6 tabs first day, 5 tab on day 2, then 4 on day 3rd, 3 tabs on day 4th , 2 tab on day 5th, and 1 tab on 6th day. (Patient not taking: Reported on 05/28/2017)    FAMILY HISTORY:  His indicated that his mother is deceased. He indicated that his father is deceased. He indicated that all of his three sisters are deceased. He indicated that the status of his brother is unknown. He indicated that the status of his maternal aunt is unknown.    SOCIAL HISTORY: He  reports that he quit smoking about 7 years ago. His smoking use included Cigarettes. He started smoking about 48 years ago. He has a 30.00 pack-year smoking history. He has never used smokeless tobacco. He reports that he does not drink alcohol or use drugs.  REVIEW OF SYSTEMS:   Unable to obtain as patient is currently on continuous BiPAP and somnolent  SUBJECTIVE:   VITAL SIGNS: BP 92/60   Pulse (!) 110   Temp (!) 96.6 F (35.9 C) (Axillary)   Resp (!) 7   Ht 5\' 8"  (1.727 m)   Wt 135 lb (61.2 kg)   SpO2 100%   BMI 20.53 kg/m   HEMODYNAMICS:    VENTILATOR SETTINGS:    INTAKE / OUTPUT: No intake/output data recorded.  PHYSICAL EXAMINATION: General:  Acutely ill-looking, in moderate to severe respiratory distress Neuro: Awakens to voice and noxious stimulus, follows some basic commands, speech is slurred, moves all extremities HEENT:  PERRLA, oral mucosa dry, trachea midline Cardiovascular: Apical pulse tachycardic, S1, S2, no murmur, regurg  or gallop, no edema, +2 pulses bilateral Lungs:  Increased work of breathing, diminished breath sounds in all lung fields, worse in the right, scattered rhonchi in all  anterior lung fields, no wheezing Abdomen:  Nondistended, normal bowel sounds, palpation reveals no organomegaly Musculoskeletal:  No joint deformity, positive range of motion Skin:  Warm and dry, poor turgor  LABS:  BMET  Recent Labs Lab 06/01/17 2141  NA 136  K 4.6  CL 88*  CO2 40*  BUN 18  CREATININE 1.15  GLUCOSE 168*    Electrolytes  Recent Labs Lab 06/01/17 2141  CALCIUM 8.8*    CBC  Recent Labs Lab 06/01/17 2141  WBC 6.7  HGB 9.8*  HCT 30.9*  PLT 156    Coag's No results for input(s): APTT, INR in the last 168 hours.  Sepsis Markers  Recent Labs Lab 06/01/17 2141  LATICACIDVEN 1.3    ABG  Recent Labs Lab 06/01/17 2225  PHART 7.34*  PCO2ART 87*  PO2ART 54*    Liver Enzymes  Recent Labs Lab 06/01/17 2141  AST 22  ALT 16*  ALKPHOS 58  BILITOT 0.4  ALBUMIN 3.7    Cardiac Enzymes  Recent Labs Lab 06/01/17 2141  TROPONINI <  0.03    Glucose  Recent Labs Lab 06/02/17 0151  GLUCAP 226*    Imaging Dg Chest Port 1 View  Result Date: 06/01/2017 CLINICAL DATA:  Respiratory distress EXAM: PORTABLE CHEST 1 VIEW COMPARISON:  May 16, 2017 FINDINGS: Lungs are hyperexpanded, stable. There is focal scarring in the right apex and right perihilar regions. There is also mild bibasilar scarring, stable. There is no appreciable edema or consolidation. The heart size is normal. Pulmonary vascularity is within normal limits and stable. Port-A-Cath tip is in the superior vena cava. No pneumothorax. No appreciable adenopathy. There old healed rib fractures bilaterally with remodeling. IMPRESSION: Areas of scarring and hyperinflation. No edema or consolidation. Stable cardiac silhouette. No pneumothorax. Electronically Signed   By: Lowella Grip III M.D.   On: 06/01/2017 21:58     STUDIES:  None  CULTURES: Blood cultures 2. Urine culture  ANTIBIOTICS: Azithromycin 8/18-8/19 Cefepime 8/19>  SIGNIFICANT EVENTS: 05/19/2017: Discharge after hospitalization for HCAP  LINES/TUBES: Peripheral IVs Left chest wall Mediport  DISCUSSION: This is a 66 year old African-American male with a history of stage IV lung cancer, unclear if in remission, presenting with acute respiratory failure secondary to recurrent pneumonia  ASSESSMENT  Lung cancer- Right upper lobe lung mass biopsies positive for adenocarcinoma; was on ibrutinib since mid June 2018-discontinued because of financial considerations. Unclear if on active treatment Recurrent right lower lobe pneumonia Acute hypoxemic/hypercarbic respiratory failure Acute COPD exacerbation Acute kidney failure-creatinine 1.79; 1.1 Anemia of chronic disease Thrombocytopenia Hyperkalemia Rule out pulmonary embolism-patient is at high-risk for thromboembolic take events, unable to obtain. CTA PE study secondary to worsening kidney function   PLAN Continuous BiPAP as tolerated; patient is at high risk for intubation Antibiotics as above Nebulized bronchodilators and steroids IV steroids IV fluids Monitor and correct electrolyte imbalances Trend creatinine Trend pro-calcitonin and adjust antibiotics. Trend CBC ABG and chest x-ray as needed. Monitor fever curve Keep nothing by mouth VQ scan to rule out pulmonary embolism if worsening respiratory status Palliative care consult to address goals of care and CODE STATUS. GI and DVT prophylaxis  FAMILY  - Updates: Patient's niece updated at bedside  - Inter-disciplinary family meet or Palliative Care meeting due by:  day [place]. [facility] ANP-BC Pulmonary and Critical Care Medicine [facility] Pager [phone] or [phone]  06/02/2017, 3:06 AM

## 2017-06-02 NOTE — Progress Notes (Signed)
[place] Progress Note Patient Name: Craig [name]. DOB: January 10, 1951 MRN: [id number]   Date of Service  06/02/2017  HPI/Events of Note  66 yo man, severe COPD and lung CA. Admitted with acute on chronic resp failure, probable AE=COPD. Currently on BiPAP, appears compensated. PCCM to see this evening  eICU Interventions       Intervention Category Evaluation Type: New Patient Evaluation  Krissia Schreier S. 06/02/2017, 2:13 AM

## 2017-06-03 ENCOUNTER — Inpatient Hospital Stay: Payer: Medicare HMO | Admitting: Internal Medicine

## 2017-06-03 DIAGNOSIS — J441 Chronic obstructive pulmonary disease with (acute) exacerbation: Principal | ICD-10-CM

## 2017-06-03 DIAGNOSIS — Z515 Encounter for palliative care: Secondary | ICD-10-CM

## 2017-06-03 DIAGNOSIS — Z7189 Other specified counseling: Secondary | ICD-10-CM

## 2017-06-03 DIAGNOSIS — J9621 Acute and chronic respiratory failure with hypoxia: Secondary | ICD-10-CM

## 2017-06-03 DIAGNOSIS — J189 Pneumonia, unspecified organism: Secondary | ICD-10-CM

## 2017-06-03 DIAGNOSIS — J9622 Acute and chronic respiratory failure with hypercapnia: Secondary | ICD-10-CM

## 2017-06-03 DIAGNOSIS — J9602 Acute respiratory failure with hypercapnia: Secondary | ICD-10-CM

## 2017-06-03 LAB — GLUCOSE, CAPILLARY
GLUCOSE-CAPILLARY: 115 mg/dL — AB (ref 65–99)
GLUCOSE-CAPILLARY: 168 mg/dL — AB (ref 65–99)
GLUCOSE-CAPILLARY: 225 mg/dL — AB (ref 65–99)
GLUCOSE-CAPILLARY: 258 mg/dL — AB (ref 65–99)
GLUCOSE-CAPILLARY: 275 mg/dL — AB (ref 65–99)
GLUCOSE-CAPILLARY: 277 mg/dL — AB (ref 65–99)
Glucose-Capillary: 56 mg/dL — ABNORMAL LOW (ref 65–99)
Glucose-Capillary: 197 mg/dL — ABNORMAL HIGH (ref 65–99)
Glucose-Capillary: 187 mg/dL — ABNORMAL HIGH (ref 65–99)

## 2017-06-03 LAB — POTASSIUM: Potassium: 5.6 mmol/L — ABNORMAL HIGH (ref 3.5–5.1)

## 2017-06-03 LAB — CBC
HEMATOCRIT: 24.7 % — AB (ref 40.0–52.0)
HEMOGLOBIN: 8.1 g/dL — AB (ref 13.0–18.0)
MCH: 29.6 pg (ref 26.0–34.0)
MCHC: 32.6 g/dL (ref 32.0–36.0)
MCV: 90.9 fL (ref 80.0–100.0)
Platelets: 139 10*3/uL — ABNORMAL LOW (ref 150–440)
RBC: 2.72 MIL/uL — ABNORMAL LOW (ref 4.40–5.90)
RDW: 14.2 % (ref 11.5–14.5)
WBC: 9.1 10*3/uL (ref 3.8–10.6)

## 2017-06-03 LAB — BASIC METABOLIC PANEL
ANION GAP: 6 (ref 5–15)
BUN: 42 mg/dL — AB (ref 6–20)
CALCIUM: 8.4 mg/dL — AB (ref 8.9–10.3)
CO2: 36 mmol/L — ABNORMAL HIGH (ref 22–32)
Chloride: 92 mmol/L — ABNORMAL LOW (ref 101–111)
Creatinine, Ser: 1.6 mg/dL — ABNORMAL HIGH (ref 0.61–1.24)
GFR calc Af Amer: 50 mL/min — ABNORMAL LOW (ref >60)
GFR, EST NON AFRICAN AMERICAN: 43 mL/min — AB (ref >60)
GLUCOSE: 102 mg/dL — AB (ref 65–99)
POTASSIUM: 6 mmol/L — AB (ref 3.5–5.1)
SODIUM: 134 mmol/L — AB (ref 135–145)

## 2017-06-03 LAB — PROCALCITONIN: Procalcitonin: 0.26 ng/mL

## 2017-06-03 LAB — PHOSPHORUS: PHOSPHORUS: 2.9 mg/dL (ref 2.5–4.6)

## 2017-06-03 LAB — MAGNESIUM: MAGNESIUM: 1.7 mg/dL (ref 1.7–2.4)

## 2017-06-03 MED ORDER — SODIUM CHLORIDE 0.9 % IV SOLN
1.0000 g | Freq: Once | INTRAVENOUS | Status: AC
  Administered 2017-06-03: 1 g via INTRAVENOUS
  Filled 2017-06-03: qty 10

## 2017-06-03 MED ORDER — LORAZEPAM 2 MG/ML IJ SOLN
1.0000 mg | Freq: Once | INTRAMUSCULAR | Status: DC
  Filled 2017-06-03: qty 1

## 2017-06-03 MED ORDER — ORAL CARE MOUTH RINSE
15.0000 mL | Freq: Two times a day (BID) | OROMUCOSAL | Status: DC
  Administered 2017-06-03 – 2017-06-06 (×6): 15 mL via OROMUCOSAL

## 2017-06-03 MED ORDER — IPRATROPIUM-ALBUTEROL 0.5-2.5 (3) MG/3ML IN SOLN
3.0000 mL | Freq: Four times a day (QID) | RESPIRATORY_TRACT | Status: DC
  Administered 2017-06-03 – 2017-06-06 (×12): 3 mL via RESPIRATORY_TRACT
  Filled 2017-06-03 (×13): qty 3

## 2017-06-03 MED ORDER — METHYLPREDNISOLONE SODIUM SUCC 125 MG IJ SOLR
80.0000 mg | Freq: Two times a day (BID) | INTRAMUSCULAR | Status: DC
  Administered 2017-06-03: 80 mg via INTRAVENOUS
  Filled 2017-06-03: qty 2

## 2017-06-03 MED ORDER — DEXTROSE 50 % IV SOLN
1.0000 | Freq: Once | INTRAVENOUS | Status: AC
  Administered 2017-06-03: 50 mL via INTRAVENOUS
  Filled 2017-06-03: qty 50

## 2017-06-03 MED ORDER — CHLORHEXIDINE GLUCONATE 0.12 % MT SOLN
15.0000 mL | Freq: Two times a day (BID) | OROMUCOSAL | Status: DC
  Administered 2017-06-04 – 2017-06-06 (×3): 15 mL via OROMUCOSAL
  Filled 2017-06-03 (×4): qty 15

## 2017-06-03 MED ORDER — ALBUTEROL SULFATE (2.5 MG/3ML) 0.083% IN NEBU: 2.5000 mg | INHALATION_SOLUTION | RESPIRATORY_TRACT | Status: DC | PRN

## 2017-06-03 MED ORDER — SODIUM CHLORIDE 0.9% FLUSH
10.0000 mL | INTRAVENOUS | Status: DC | PRN
  Administered 2017-06-06 (×2): 10 mL
  Filled 2017-06-03 (×2): qty 40

## 2017-06-03 MED ORDER — INSULIN ASPART 100 UNIT/ML IV SOLN
5.0000 [IU] | Freq: Once | INTRAVENOUS | Status: AC
  Administered 2017-06-03: 5 [IU] via INTRAVENOUS
  Filled 2017-06-03: qty 0.05

## 2017-06-03 MED ORDER — SODIUM BICARBONATE 8.4 % IV SOLN
50.0000 meq | Freq: Once | INTRAVENOUS | Status: AC
  Administered 2017-06-03: 50 meq via INTRAVENOUS
  Filled 2017-06-03: qty 50

## 2017-06-03 MED ORDER — BUDESONIDE 0.25 MG/2ML IN SUSP
0.2500 mg | Freq: Four times a day (QID) | RESPIRATORY_TRACT | Status: DC
  Administered 2017-06-03 – 2017-06-06 (×12): 0.25 mg via RESPIRATORY_TRACT
  Filled 2017-06-03 (×14): qty 2

## 2017-06-03 MED ORDER — ZOLPIDEM TARTRATE 5 MG PO TABS
5.0000 mg | ORAL_TABLET | Freq: Every evening | ORAL | Status: DC | PRN
  Administered 2017-06-03 (×2): 5 mg via ORAL
  Filled 2017-06-03 (×2): qty 1

## 2017-06-03 NOTE — Progress Notes (Deleted)
[place] OFFICE PROGRESS NOTE  Patient Care Team: [name], MD as PCP - General (Family Medicine) Lyman Speller, RN as [place] Management  Cancer Staging Lung cancer, upper lobe [facility]) Staging form: Lung, AJCC 7th Edition - Clinical stage from 06/15/2010: yT2, N2, M0 - Signed by [name], NP on 02/03/2015 - Pathologic stage from 04/04/2014: Maureen Chatters, M1 - Signed by [name], NP on 02/03/2015  Lymphoma, small lymphocytic ([place]) Staging form: Lymphoid Neoplasms, AJCC 6th Edition - Clinical stage from 10/15/2013: Stage II - Signed by [name], NP on 02/03/2015  Renal cell cancer [facility]) Staging form: Kidney, AJCC 7th Edition - Clinical stage from 02/03/2015: Stage I (yT1a, N0, M0) - Signed by [name], NP on 02/03/2015    Oncology History   1. Right upper lobe lung mass biopsies positive for adenocarcinoma; PET- T2, N2, M0;  stage IIIA;  carboplatinum Alimta and Avastin;  [april  9th of 2013]  Patient had an allergic reaction to carboplatinum with acute bronchospasm in May of 2013; carbo-discontinued.; Maintenance Alimta and Avastin  from June of 2013 hold chemotherapy because of increasing shortness of breath (in July, 2013); VARISTRAT-GOOD; Started on [place] February 3 about 2014; Diarrhea 4 days after Tarceva was started.  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   # SEP 2017- GAZYVA x1 [stopped sec to respi issues]  # Nov 1st week- START Ibrutinib 2 pills day; Last Jan 22nd 2018 [sec to co-pay].   # FEB 19th- cycle #1 Bendamustine [day-1& 2-  70mg /m2; Rituxan with cycle #2  # RIGHT KIDNEY CA- RCC [s/p Bx]- s/p Cryo [Dr.Yamagat; IR; 2016]   #  Poor pulmonary function; 3 L home O2.     Lung cancer, upper lobe ([place])   02/03/2015 Initial Diagnosis    Lung cancer, upper lobe       Lymphoma, small lymphocytic ([place])   02/03/2015 Initial Diagnosis    Lymphoma, small lymphocytic ([facility])      Cancer of lung ([place])   12/21/2015 Initial Diagnosis    Cancer of lung ([place])      Primary cancer of right upper lobe of lung ([place])   06/01/2016 Initial Diagnosis    Primary cancer of right upper lobe of lung ([place])       INTERVAL HISTORY:  Craig [name]. 66 y.o.  male pleasant patientHistory of chronic respiratory failure on home O2/Severe COPD with above history of Multiple malignancies- most recently with progressive SLL- Currently back on ibrutinib since mid June 2018 is here for follow-up/to review the results of his PET scan.  Continues to have chronic shortness of breath is not any worse. Chronic cough. No hemoptysis. Continues to have chronic chest wall pain- for which is on narcotic pain medication. He denies any weight loss. Denies any night sweats. He denies any new lumps or bumps. Fortunately patient has not been admitted to the hospital for his COPD exacerbation this time. He denies any diarrhea. Denies any easy bruising. Denies any palpitations.  REVIEW OF SYSTEMS:  A complete 10  point review of system is done which is negative except mentioned above/history of present illness.   PAST MEDICAL HISTORY :  Past Medical History:  Diagnosis Date  . Asthma   . Cancer ([place])   . Colon cancer ([place])   . COPD (chronic obstructive pulmonary disease) ([place])   . Diabetes mellitus without complication ([place])   . Difficulty sleeping   . Dysrhythmia   . GERD (gastroesophageal reflux disease)   . Glaucoma   . History of bronchitis   . Hypertension   . Lung cancer ([place])    right upper lobe mass positive for adenocarcinoma  . Lymphoma  ([facility])    low grade  . Renal cancer ([place])   . Respiratory failure ([place]) 07/12/2010   acute  . Right renal mass   . Shortness of breath dyspnea   . Stroke ([place]) 1990'S   NO RESIDUAL PROBLEMS  . Supplemental oxygen dependent    USES 3 LITERS CONTINUOUSLY    PAST SURGICAL HISTORY :   Past Surgical History:  Procedure Laterality Date  . IR GENERIC HISTORICAL  05/31/2016   IR RADIOLOGIST EVAL & MGMT 05/31/2016 Aletta Edouard, MD [facility] INTERV RAD    FAMILY HISTORY :   Family History  Problem Relation Age of Onset  . Diabetes Mellitus II Mother   . Hypertension Mother   . Cancer Mother   . Diabetes Mother   . Tuberculosis Father   . Positive PPD/TB Exposure Father   . Diabetes Mellitus II Sister   . Cancer Sister   . Sickle cell anemia Sister   . Prostate cancer Brother   . Cancer Brother   . Cancer Sister   . Cancer Maternal Aunt     SOCIAL HISTORY:   Social History  Substance Use Topics  . Smoking status: Former Smoker    Packs/day: 1.00    Years: 30.00    Types: Cigarettes    Start date: 12/28/1968    Quit date: 01/13/2010  . Smokeless tobacco: Never Used  . Alcohol use No     Comment: stopped 2002     ALLERGIES:  is allergic to iodinated diagnostic agents and nexium [esomeprazole magnesium].  MEDICATIONS:  No current facility-administered medications for this visit.    No current outpatient prescriptions on file.   Facility-Administered Medications Ordered in Other Visits  Medication Dose Route Frequency Provider Last Rate Last Dose  . 0.9 %  sodium chloride infusion   Intravenous Continuous Tukov, Magadalene S, NP 100 mL/hr at 06/03/17 0700    . acetaminophen (TYLENOL) tablet 650 mg  650 mg Oral Q6H PRN Hugelmeyer, Alexis, DO       Or  . acetaminophen (TYLENOL) suppository 650 mg  650 mg Rectal Q6H PRN Hugelmeyer, Alexis, DO      . albuterol (PROVENTIL) (2.5 MG/3ML) 0.083% nebulizer solution 2.5 mg  2.5 mg Nebulization Q6H PRN Hugelmeyer, Alexis, DO      .  bisacodyl (DULCOLAX) EC tablet 5 mg  5 mg Oral Daily PRN Hugelmeyer, Alexis, DO      . budesonide (PULMICORT) nebulizer solution 0.25 mg  0.25 mg Nebulization BID Hugelmeyer, Alexis, DO   0.25 mg at 06/03/17 0731  . ceFEPIme (MAXIPIME) 2 g in dextrose 5 % 50 mL IVPB  2 g Intravenous Daily Lifsey, Betti Cruz, RPH   Stopped at 06/02/17 1200  . chlorhexidine (PERIDEX) 0.12 % solution 15 mL  15 mL Mouth Rinse BID Tukov, Magadalene S, NP      . diphenhydrAMINE (BENADRYL)  injection 50 mg  50 mg Intravenous Once Nena Polio, MD      . feeding supplement (ENSURE ENLIVE) (ENSURE ENLIVE) liquid 237 mL  1 Bottle Oral TID Dorene Sorrow S, NP      . heparin injection 5,000 Units  5,000 Units Subcutaneous Q8H Flora Lipps, MD   5,000 Units at 06/03/17 0503  . losartan (COZAAR) tablet 50 mg  50 mg Oral Daily Hugelmeyer, Alexis, DO       And  . hydrochlorothiazide (MICROZIDE) capsule 12.5 mg  12.5 mg Oral Daily Hugelmeyer, Alexis, DO      . hydrocortisone sodium succinate (SOLU-CORTEF) injection 200 mg  200 mg Intravenous Once Nena Polio, MD      . insulin aspart (novoLOG) injection 0-15 Units  0-15 Units Subcutaneous Q4H Hugelmeyer, Alexis, DO   8 Units at 06/03/17 0027  . ipratropium (ATROVENT) nebulizer solution 0.5 mg  0.5 mg Nebulization Q6H PRN Hugelmeyer, Alexis, DO      . ipratropium-albuterol (DUONEB) 0.5-2.5 (3) MG/3ML nebulizer solution 3 mL  3 mL Nebulization Q4H Flora Lipps, MD   3 mL at 06/03/17 0731  . magnesium citrate solution 1 Bottle  1 Bottle Oral Once PRN Hugelmeyer, Alexis, DO      . MEDLINE mouth rinse  15 mL Mouth Rinse q12n4p Tukov, Magadalene S, NP      . methylPREDNISolone sodium succinate (SOLU-MEDROL) 125 mg/2 mL injection 60 mg  60 mg Intravenous Q6H Hugelmeyer, Alexis, DO   60 mg at 06/03/17 0414  . mometasone-formoterol (DULERA) 200-5 MCG/ACT inhaler 2 puff  2 puff Inhalation BID Hugelmeyer, Alexis, DO   Stopped at 06/02/17 0800  . morphine 2 MG/ML injection 2-4 mg  2-4  mg Intravenous Q1H PRN Flora Lipps, MD   2 mg at 06/03/17 0500  . ondansetron (ZOFRAN) tablet 4 mg  4 mg Oral Q6H PRN Hugelmeyer, Alexis, DO       Or  . ondansetron (ZOFRAN) injection 4 mg  4 mg Intravenous Q6H PRN Hugelmeyer, Alexis, DO      . oxyCODONE (Oxy IR/ROXICODONE) immediate release tablet 5 mg  5 mg Oral Q4H PRN Hugelmeyer, Alexis, DO      . senna-docusate (Senokot-S) tablet 1 tablet  1 tablet Oral QHS PRN Hugelmeyer, Alexis, DO      . sodium chloride 0.9 % injection 10 mL  10 mL Intracatheter PRN Choksi, Janak, MD   10 mL at 04/28/15 1410  . sodium chloride 0.9 % injection 10 mL  10 mL Intravenous PRN Forest Gleason, MD   10 mL at 05/12/15 1350  . sodium chloride flush (NS) 0.9 % injection 3 mL  3 mL Intravenous Q12H Hugelmeyer, Alexis, DO   3 mL at 06/02/17 2158  . tiotropium (SPIRIVA) inhalation capsule 18 mcg  18 mcg Inhalation Daily Hugelmeyer, Alexis, DO      . zolpidem (AMBIEN) tablet 5 mg  5 mg Oral QHS PRN Dorene Sorrow S, NP   5 mg at 06/03/17 0047    PHYSICAL EXAMINATION: ECOG PERFORMANCE STATUS: 2 - Symptomatic, <50% confined to bed  There were no vitals taken for this visit.  There were no vitals filed for this visit.  GENERAL: Well-nourished well-developed; Alert, no distress and comfortable. He is alone. He is on home oxygen. He is in a wheelchair.  EYES: no pallor or icterus OROPHARYNX: no thrush or ulceration; good dentition  NECK: supple, no masses felt LYMPH:  no palpable lymphadenopathy in the cervical, axillary or inguinal regions LUNGS:Bilateral decreased  air entry to auscultationn and  No wheeze or crackles HEART/CVS: regular rate & rhythm and no murmurs; No lower extremity edema ABDOMEN:abdomen soft, non-tender and normal bowel sounds Musculoskeletal:no cyanosis of digits and no clubbing  PSYCH: alert & oriented x 3 with fluent speech NEURO: no focal motor/sensory deficits SKIN:  no rashes or significant lesions  LABORATORY DATA:  I have  reviewed the data as listed    Component Value Date/Time   NA 133 (L) 06/02/2017 1708   NA 139 01/17/2015 0913   K 5.4 (H) 06/02/2017 1708   K 3.8 01/17/2015 0913   CL 89 (L) 06/02/2017 1708   CL 99 (L) 01/17/2015 0913   CO2 34 (H) 06/02/2017 1708   CO2 35 (H) 01/17/2015 0913   GLUCOSE 132 (H) 06/02/2017 1708   GLUCOSE 169 (H) 01/17/2015 0913   BUN 34 (H) 06/02/2017 1708   BUN 15 01/17/2015 0913   CREATININE 1.99 (H) 06/02/2017 1708   CREATININE 1.23 01/17/2015 0913   CALCIUM 8.0 (L) 06/02/2017 1708   CALCIUM 8.8 (L) 01/17/2015 0913   PROT 6.7 06/02/2017 0419   PROT 6.6 01/17/2015 0913   ALBUMIN 3.7 06/02/2017 0419   ALBUMIN 3.7 01/17/2015 0913   AST 28 06/02/2017 0419   AST 20 01/17/2015 0913   ALT 24 06/02/2017 0419   ALT 15 (L) 01/17/2015 0913   ALKPHOS 60 06/02/2017 0419   ALKPHOS 76 01/17/2015 0913   BILITOT 0.3 06/02/2017 0419   BILITOT 0.5 01/17/2015 0913   GFRNONAA 33 (L) 06/02/2017 1708   GFRNONAA >60 01/17/2015 0913   GFRAA 39 (L) 06/02/2017 1708   GFRAA >60 01/17/2015 0913    No results found for: SPEP, UPEP  Lab Results  Component Value Date   WBC 9.3 06/02/2017   NEUTROABS 4.1 06/01/2017   HGB 9.9 (L) 06/02/2017   HCT 30.9 (L) 06/02/2017   MCV 92.4 06/02/2017   PLT 144 (L) 06/02/2017      Chemistry      Component Value Date/Time   NA 133 (L) 06/02/2017 1708   NA 139 01/17/2015 0913   K 5.4 (H) 06/02/2017 1708   K 3.8 01/17/2015 0913   CL 89 (L) 06/02/2017 1708   CL 99 (L) 01/17/2015 0913   CO2 34 (H) 06/02/2017 1708   CO2 35 (H) 01/17/2015 0913   BUN 34 (H) 06/02/2017 1708   BUN 15 01/17/2015 0913   CREATININE 1.99 (H) 06/02/2017 1708   CREATININE 1.23 01/17/2015 0913      Component Value Date/Time   CALCIUM 8.0 (L) 06/02/2017 1708   CALCIUM 8.8 (L) 01/17/2015 0913   ALKPHOS 60 06/02/2017 0419   ALKPHOS 76 01/17/2015 0913   AST 28 06/02/2017 0419   AST 20 01/17/2015 0913   ALT 24 06/02/2017 0419   ALT 15 (L) 01/17/2015 0913    BILITOT 0.3 06/02/2017 0419   BILITOT 0.5 01/17/2015 0913       RADIOGRAPHIC STUDIES: I have personally reviewed the radiological images as listed and agreed with the findings in the report. Dg Chest Port 1 View  Result Date: 06/01/2017 CLINICAL DATA:  Respiratory distress EXAM: PORTABLE CHEST 1 VIEW COMPARISON:  May 16, 2017 FINDINGS: Lungs are hyperexpanded, stable. There is focal scarring in the right apex and right perihilar regions. There is also mild bibasilar scarring, stable. There is no appreciable edema or consolidation. The heart size is normal. Pulmonary vascularity is within normal limits and stable. Port-A-Cath tip is in the superior vena cava. No  pneumothorax. No appreciable adenopathy. There old healed rib fractures bilaterally with remodeling. IMPRESSION: Areas of scarring and hyperinflation. No edema or consolidation. Stable cardiac silhouette. No pneumothorax. Electronically Signed   By: Lowella Grip III M.D.   On: 06/01/2017 21:58     ASSESSMENT & PLAN:  No problem-specific Assessment & Plan notes found for this encounter.   No orders of the defined types were placed in this encounter.     [name], MD 06/03/2017 8:28 AM

## 2017-06-03 NOTE — Progress Notes (Signed)
Patient requested to go back on Bi-PAP. RR was noted to be 40, in obvious distress, speaking in 2-3 word sentences.

## 2017-06-03 NOTE — Assessment & Plan Note (Deleted)
#   Small lymphocytic lymphoma-AUG  2018 CT- bulky adenopathy in the retroperitoneum progression. Patient unable to tolerate Rituxan; bendamustine. Previously tolerated ibrutinib- however currently discontinued because of financial considerations; started back 420 mg/day [mid June 2018]. PET scan- July 5th- no active LymphomaL/ or active malignancy.   # Advanced COPD-stable/multiple hospitalizations. Patient's pulse ox on 4 L 80%%. Currently stable.   # Pain- chest when coughing- continue pain meds prn. Stable.    # History of lung cancer- no evidence of recurrence  # History of RCC-status post ablation; no evidence of recurrence.  # Port flush every 2 months; flushed today.  # Follow up in 4 weeks/labs.  # I reviewed the blood work- with the patient in detail; also reviewed the imaging independently [as summarized above]; and with the patient in detail.

## 2017-06-03 NOTE — Progress Notes (Signed)
PULMONARY / CRITICAL CARE MEDICINE   Name: Craig [name]. MRN: [id number] DOB: 1951-08-03    ADMISSION DATE:  06/01/2017   CONSULTATION DATE:  06/02/17  REFERRING MD:  Dr. Dia Sitter  Reason: Acute respiratory failure  CHIEF COMPLAINT:  Worsening dyspnea  HISTORY OF PRESENT ILLNESS:   This is a 66 year old African-American male with a past medical history as indicated below. End stage IV lung cancer who is currently diagnosed with pneumonia and being treated with antibiotics on an outpatient basis, presenting with worsening dyspnea. Symptoms started 3 days ago and has progressively gotten worse. Hence, patient called EMS. Upon EMS arrival, patient was severely tachypneic and hypoxic, hence, he was placed on BiPAP. He remains tachypneic, with hypoxemic episodes with minimal exertion. Dyspnea is associated with a productive cough with thick pinkish sputum. No reported fever. Patient got very agitated in the ED and tried taking off his BiPAP. He was given a dose of lorazepam and Haldol. He awakens to voice, but does not follow commands. Patient was recently discharged from the hospital on the [facility] of August 2018 after hospitalization for pneumonia, COPD exacerbation, acute respiratory failure and sepsis. His last office visit with Oncology was on the 5th of July 2018. It is unclear if patient is currently on any chemotherapeutic agents as there is none on his medication list.  SUBJECTIVE: Improved respiratory status. Off BiPAP; now on Hi-Flow [place]. Patient is very resistant to care. Wanted Hi-Flow nasal cannula off but because acute dyspneic, tachypneic and tachycardic on regula nasal cannula. After much convincing, patient agreed to be placed on Hi-Flow [place]. Slept the rest of the night.   VITAL SIGNS: BP (!) 98/57   Pulse 95   Temp 98.5 F (36.9 C) (Oral)   Resp (!) 36   Ht 5\' 8"  (1.727 m)   Wt 137 lb 9.1 oz (62.4 kg)   SpO2 100%   BMI 20.92 kg/m   HEMODYNAMICS:    VENTILATOR  SETTINGS: FiO2 (%):  [40 %-50 %] 50 %  INTAKE / OUTPUT: I/O last 3 completed shifts: In: 2706.3 [I.V.:2656.3; IV Piggyback:50] Out: 955 [Urine:955]  PHYSICAL EXAMINATION: General:  Acutely ill-looking, in moderate respiratory distress Neuro: Awakens to voice and noxious stimulus, follows some basic commands, speech is slurred, moves all extremities HEENT:  PERRLA, oral mucosa dry, trachea midline Cardiovascular: Apical pulse tachycardic, S1, S2, no murmur, regurg or gallop, no edema, +2 pulses bilateral Lungs: Improved work of breathing, diminished breath sounds in all lung fields, worse in the right, scattered rhonchi in all  anterior lung fields, no wheezing Abdomen:  Nondistended, normal bowel sounds, palpation reveals no organomegaly Musculoskeletal:  No joint deformity, positive range of motion Skin:  Warm and dry, poor turgor  LABS:  BMET  Recent Labs Lab 06/01/17 2141 06/02/17 0419 06/02/17 1708  NA 136 137 133*  K 4.6 5.5* 5.4*  CL 88* 90* 89*  CO2 40* 38* 34*  BUN 18 24* 34*  CREATININE 1.15 1.79* 1.99*  GLUCOSE 168* 191* 132*    Electrolytes  Recent Labs Lab 06/01/17 2141 06/02/17 0419 06/02/17 1708  CALCIUM 8.8* 8.6* 8.0*  MG  --  1.8  --   PHOS  --  4.6  --     CBC  Recent Labs Lab 06/01/17 2141 06/02/17 0419 06/03/17 0306  WBC 6.7 9.3 9.1  HGB 9.8* 9.9* 8.1*  HCT 30.9* 30.9* 24.7*  PLT 156 144* 139*    Coag's No results for input(s): APTT, INR in the last  168 hours.  Sepsis Markers  Recent Labs Lab 06/01/17 2141 06/02/17 0419 06/03/17 0306  LATICACIDVEN 1.3  --   --   PROCALCITON  --  0.15 0.26    ABG  Recent Labs Lab 06/01/17 2225  PHART 7.34*  PCO2ART 87*  PO2ART 54*    Liver Enzymes  Recent Labs Lab 06/01/17 2141 06/02/17 0419  AST 22 28  ALT 16* 24  ALKPHOS 58 60  BILITOT 0.4 0.3  ALBUMIN 3.7 3.7    Cardiac Enzymes  Recent Labs Lab 06/01/17 2141  TROPONINI <0.03    Glucose  Recent Labs Lab  06/02/17 1556 06/02/17 1932 06/03/17 0013 06/03/17 0411 06/03/17 0437 06/03/17 0722  GLUCAP 100* 242* 275* 56* 115* 168*    Imaging No results found.  STUDIES:  None  CULTURES: Blood cultures 2. Urine culture  ANTIBIOTICS: Azithromycin 8/18-8/19 Cefepime 8/19>  SIGNIFICANT EVENTS: 05/19/2017: Discharge after hospitalization for HCAP  LINES/TUBES: Peripheral IVs Left chest wall Mediport  DISCUSSION: This is a 66 year old African-American male with a history of stage IV lung cancer, unclear if in remission, presenting with acute respiratory failure secondary to recurrent pneumonia  ASSESSMENT  Lung cancer- Right upper lobe lung mass biopsies positive for adenocarcinoma; was on ibrutinib since mid June 2018-discontinued because of financial considerations. Unclear if on active treatment Recurrent right lower lobe pneumonia Acute hypoxemic/hypercarbic respiratory failure Acute COPD exacerbation Acute kidney failure-creatinine 1.79; 1.1 Anemia of chronic disease Thrombocytopenia Hyperkalemia Rule out pulmonary embolism-patient is at high-risk for thromboembolic take events, unable to obtain. CTA PE study secondary to worsening kidney function   PLAN Supplemental O2 Via Hi-Flow [place] and prn  BiPAP as tolerated; patient is at high risk for intubation Antibiotics as above Nebulized bronchodilators and steroids IV steroids IV fluids Monitor and correct electrolyte imbalances Trend creatinine Trend pro-calcitonin and adjust antibiotics. Trend CBC ABG and chest x-ray as needed. Monitor fever curve Resume regular diet starting with liquids and progressing as tolerated VQ scan to rule out pulmonary embolism if worsening respiratory status Palliative care consult to address goals of care and CODE STATUS. GI and DVT prophylaxis  FAMILY  - Updates: Patient and girlfriend updated on current treatment plan - Inter-disciplinary family meet or Palliative Care meeting due  by:  day 7    Magdalene S. [facility] ANP-BC Pulmonary and [place] Pager [phone] or [phone]  06/03/2017, 8:42 AM   PCCM ATTENDING ATTESTATION:  I have evaluated patient with the APP [place], reviewed database in its entirety and discussed care plan in detail. In addition, this patient was discussed on multidisciplinary rounds.   Important exam findings: Still requiring BiPAP Cognition intact Neurologic exam normal HEENT WNL Diffuse coarse wheezes RRR, no M Abdomen soft, + BS Extremities warm, no edema  CXR 08/18: severe emphysema, no acute findings   Major problems addressed by PCCM team: Acute on chronic hypoxemic/hypercarbic respiratory failure Emphysema AE COPD Doubt PNA Hyperkalemia AKI - improving Cr  PLAN/REC: Continue PRN BiPAP Continue Supplemental O2 Continue Systemic steroids Continue Nebulized steroids and bronchodilators Continue Empiric antibiotics for now. Recheck CXR in a.m. Consider narrowing or DC ABX Discontinue ARB until renal function and electrolytes improved   Merton Border, MD PCCM service Mobile [phone] Pager [phone] 06/03/2017 2:49 PM

## 2017-06-03 NOTE — Progress Notes (Signed)
Patient resting in room at this time.  PRN sleep med given with + effect. Patient atttempted to rfused HFNC.  NP aware.  Patient advised to continue with HFNC at this time. Patient reluctantly agreed. PRN morphine given for comfort. + affect.  Significant other at bedside.

## 2017-06-03 NOTE — Progress Notes (Signed)
Pharmacy Antibiotic Note  Craig [name]. is a 66 y.o. male admitted on 06/01/2017 with pneumonia.  Pharmacy has been consulted for Cefepime dosing.  Plan: Will continue cefepime 2 g iv q 24 hours.   Height: 5\' 8"  (172.7 cm) Weight: 137 lb 9.1 oz (62.4 kg) IBW/kg (Calculated) : 68.4  Temp (24hrs), Avg:98.3 F (36.8 C), Min:97.8 F (36.6 C), Max:98.8 F (37.1 C)   Recent Labs Lab 06/01/17 2141 06/02/17 0419 06/02/17 1708 06/03/17 0306  WBC 6.7 9.3  --  9.1  CREATININE 1.15 1.79* 1.99* 1.60*  LATICACIDVEN 1.3  --   --   --     Estimated Creatinine Clearance: 40.1 mL/min (A) (by C-G formula based on SCr of 1.6 mg/dL (H)).    Allergies  Allergen Reactions  . Iodinated Diagnostic Agents Shortness Of Breath and Nausea And Vomiting    13-hour prep  . Nexium [Esomeprazole Magnesium] Other (See Comments)    headache    Antimicrobials this admission: Azithromycin 8/19 >> x1 dose Cefepime 8/19 >>   Dose adjustments this admission: Cefepime dose initiated as a renally adjustd dose of 2g Q24H.   Microbiology results: MRSA PCR 8/19 >> negative  Thank you for allowing pharmacy to be a part of this patient's care.  Ulice Dash, PharmD Clinical Pharmacist  06/03/2017 1:27 PM

## 2017-06-03 NOTE — Progress Notes (Signed)
[place] [facility] [place] NAME: Tamotsu Wiederholt    MR#:  [id number]  DATE OF BIRTH:  1951/07/01  SUBJECTIVE: Patient is admitted for COPD evaluation. And she is on BiPAP. No shortness of breath today.   CHIEF COMPLAINT:   Chief Complaint  Patient presents with  . Respiratory Distress    REVIEW OF SYSTEMS:   ROS CONSTITUTIONAL: No fever, fatigue or weakness.  EYES: No blurred or double vision.  EARS, NOSE, AND THROAT: No tinnitus or ear pain.  RESPIRATORY: On BiPAP for COPD exacerbation. CARDIOVASCULAR: No chest pain, orthopnea, edema.  GASTROINTESTINAL: No nausea, vomiting, diarrhea or abdominal pain.  GENITOURINARY: No dysuria, hematuria.  ENDOCRINE: No polyuria, nocturia,  HEMATOLOGY: No anemia, easy bruising or bleeding SKIN: No rash or lesion. MUSCULOSKELETAL: No joint pain or arthritis.   NEUROLOGIC: No tingling, numbness, weakness.  PSYCHIATRY: No anxiety or depression.   DRUG ALLERGIES:   Allergies  Allergen Reactions  . Iodinated Diagnostic Agents Shortness Of Breath and Nausea And Vomiting    13-hour prep  . Nexium [Esomeprazole Magnesium] Other (See Comments)    headache    VITALS:  Blood pressure 134/74, pulse (!) 105, temperature 97.8 F (36.6 C), temperature source Axillary, resp. rate (!) 34, height 5\' 8"  (1.727 m), weight 62.4 kg (137 lb 9.1 oz), SpO2 97 %.  PHYSICAL EXAMINATION:  GENERAL:  66 y.o.-year-old patient lying in the bed with no acute distress.  EYES: Pupils equal, round, reactive to light and accommodation. No scleral icterus. Extraocular muscles intact.  HEENT: Head atraumatic, normocephalic. Oropharynx and nasopharynx clear.  NECK:  Supple, no jugular venous distention. No thyroid enlargement, no tenderness.  LUNGS: Bilateral expiratory wheeze in all lung fields. CARDIOVASCULAR: S1, S2 normal. No murmurs, rubs, or gallops.  ABDOMEN: Soft, nontender, nondistended. Bowel sounds present. No  organomegaly or mass.  EXTREMITIES: No pedal edema, cyanosis, or clubbing.  NEUROLOGIC: Cranial nerves II through XII are intact. Muscle strength 5/5 in all extremities. Sensation intact. Gait not checked.  PSYCHIATRIC: The patient is alert and oriented x 3.  SKIN: No obvious rash, lesion, or ulcer.    LABORATORY PANEL:   CBC  Recent Labs Lab 06/03/17 0306  WBC 9.1  HGB 8.1*  HCT 24.7*  PLT 139*   ------------------------------------------------------------------------------------------------------------------  Chemistries   Recent Labs Lab 06/02/17 0419  06/03/17 0306  NA 137  < > 134*  K 5.5*  < > 6.0*  CL 90*  < > 92*  CO2 38*  < > 36*  GLUCOSE 191*  < > 102*  BUN 24*  < > 42*  CREATININE 1.79*  < > 1.60*  CALCIUM 8.6*  < > 8.4*  MG 1.8  --  1.7  AST 28  --   --   ALT 24  --   --   ALKPHOS 60  --   --   BILITOT 0.3  --   --   < > = values in this interval not displayed. ------------------------------------------------------------------------------------------------------------------  Cardiac Enzymes  Recent Labs Lab 06/01/17 2141  TROPONINI <0.03   ------------------------------------------------------------------------------------------------------------------  RADIOLOGY:  Dg Chest Port 1 View  Result Date: 06/01/2017 CLINICAL DATA:  Respiratory distress EXAM: PORTABLE CHEST 1 VIEW COMPARISON:  May 16, 2017 FINDINGS: Lungs are hyperexpanded, stable. There is focal scarring in the right apex and right perihilar regions. There is also mild bibasilar scarring, stable. There is no appreciable edema or consolidation. The heart size is normal. Pulmonary vascularity is within normal  limits and stable. Port-A-Cath tip is in the superior vena cava. No pneumothorax. No appreciable adenopathy. There old healed rib fractures bilaterally with remodeling. IMPRESSION: Areas of scarring and hyperinflation. No edema or consolidation. Stable cardiac silhouette. No  pneumothorax. Electronically Signed   By: Lowella Grip III M.D.   On: 06/01/2017 21:58    EKG:   Orders placed or performed during the hospital encounter of 06/01/17  . ED EKG  . ED EKG  . EKG 12-Lead  . EKG 12-Lead    ASSESSMENT AND PLAN:   #1 COPD exacerbation: Acute respiratory failure on chronic respiratory failure with hypoxia and hypercarbia. Continue IV steroids, bronchodilators, backward support, monitored in intensive care unit. Continue IV antibiotics. 2.Stage IV lung cancer, colon cancer, renal cancer: Prognosis is really poor, poor status is DO NOT RESUSCITATE,  seen by palliative care also. Asked #3 diabetes mellitus type 2: Continue SSI with coverage. #4 severe hyperkalemia with potassium up to 6, acute renal failure. Continue IV hydration, Kayexalate, recheck potassium. Condition critical, high risk for cardiac arrest. Patient to get a VQ scan to rule out PE.     All the records are reviewed and case discussed with Care Management/Social Workerr. Management plans discussed with the patient, family and they are in agreement.  CODE STATUS:DNR  TOTAL TIME TAKING CARE OF THIS PATIENT: 35 min minutes.   POSSIBLE D/C IN 3-4 DAYS, DEPENDING ON CLINICAL CONDITION.   [name] M.D on 06/03/2017 at 1:50 PM  Between 7am to 6pm - Pager - [phone]  After 6pm go to [email or web address] - password [email or web address] [facility]  Office  [phone]  CC: Primary care physician; [name], MD   Note: This dictation was prepared with Dragon dictation along with smaller phrase technology. Any transcriptional errors that result from this process are unintentional.

## 2017-06-03 NOTE — Progress Notes (Signed)
Chaplain was doing evening rounds in the ICU unit and stopped in to check on the patient and then the nurse was getting ready to work with the patient. Chaplain did a prayer and left.

## 2017-06-03 NOTE — Consult Note (Signed)
Consultation Note Date: 06/03/2017   Patient Name: Craig [name].  DOB: 08/21/1951  MRN: [id number]  Age / Sex: 66 y.o., male  PCP: [name], MD Referring Physician: [name], MD  Reason for Consultation: Establishing goals of care  HPI/Patient Profile: 66 y.o. male  with past medical history of COPD, oxygen dependent, stage IV lung cancer, colon and renal cancer, hypertension, GERD, diabetes mellitus, and CVA admitted on 06/01/2017 with respiratory distress. Recent hospitalization for sepsis, HCAP, and COPD exacerbation. Hx of stage IV lung Cancer was recently on ibrutibib. Oncology notes reviewed. Unable to perform CTA to r/o PE due to worsening kidney function. Currently on BiPAP. Receiving antibiotics, nebulized bronchodilators, steroids for recurrent RLL pneumonia and COPD exacerbation. Palliative medicine consultation for goals of care.   Clinical Assessment and Goals of Care: I have reviewed medical records, discussed with care team, and initially met with patient and daughter Mliss Sax) at bedside to discuss diagnosis, [place], EOL wishes, disposition and options. Patient currently on BiPAP. He tells me that decisions can be deferred to daughter and sister. Shortly after, met in family waiting room with Mliss Sax, sister Katharine Look), and niece Engineer, maintenance).   Introduced Palliative Medicine as specialized medical care for people living with serious illness. It focuses on providing relief from the symptoms and stress of a serious illness. The goal is to improve quality of life for both the patient and the family.  We discussed a brief life review of the patient. 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 One daughter Mliss Sax) who lives in [place]. Lives with significant other. Patient tells me he was diagnosed with COPD 8-9 years ago and lung cancer in 2011. He  has been on home oxygen for 5 years. Katharine Look speaks of his decline in the last few weeks with increased weakness and shortness of breath with minimal exertion.   Dr. Alva Garnet provided family an update outside of room. We then discussed hospital diagnoses and interventions. Educated on chronic progressive nature of COPD--worsening respiratory status due to COPD and underlying pneumonia this hospitalization. Also with underlying stage IV lung cancer.   Advanced directives, concepts specific to code status, and artifical feeding and hydration were discussed. Mliss Sax is documented HCPOA (documentation reviewed in paper chart). Patient has recently spoken of his wishes to Katharine Look of NOT wanting to be resuscitated or on life support if his condition worsened. Mliss Sax and Katharine Look respect his decision and tell me he has recently stated "I am tired."   The difference between aggressive medical intervention and comfort care was considered in light of the patient's goals of care. Katharine Look and Mliss Sax are hopeful for improvement and wean from BiPAP in the next day or two. They also seem realistic that he may continue to decline with progressive COPD and acute on chronic respiratory failure. Per daughter and sister, he was recently inquiring about hospice services in the home and has been preparing for EOL. Briefly discussed comfort approach  if he is unable to wean from BiPAP/high flow.    Mliss Sax and Katharine Look ask about discharge options. They feel he cannot return home after hospitalization because his girlfriend cannot provide the care he needs. Explained that we need to see how he does through course of hospitalization. If he is able to wean from oxygen and work with physical therapy, rehab may be an option. We also briefly discussed hospice options--home versus hospice home if he does not show improvement.   Questions and concerns were addressed. Spiritual family. Therapeutic listening and emotional/spiritual  support provided.     SUMMARY OF RECOMMENDATIONS    Patient/family confirm DNR/DNI.   Continue current interventions. Watchful waiting.   HCPOA's--daughter Mliss Sax) and sister Katharine Look). They are realistic of guarded prognosis with acute on chronic respiratory failure secondary to COPD, pneumonia, and stage IV lung cancer.   PMT will continue to support patient/family through hospitalization and further discuss hospice if appropriate.   Code Status/Advance Care Planning:  DNR  Symptom Management:   Agree with prn morphine for pain/dyspnea/air hunger  Palliative Prophylaxis:   Aspiration, Delirium Protocol, Frequent Pain Assessment, Oral Care and Turn Reposition  Psycho-social/Spiritual:   Desire for further Chaplaincy support:yes  Additional Recommendations: Caregiving  Support/Resources and Education on Hospice  Prognosis:   Unable to determine: guarded with acute on chronic respiratory failure secondary to COPD, pneumonia, and stage IV lung cancer.   Discharge Planning: To Be Determined      Primary Diagnoses: Present on Admission: . Acute respiratory failure with hypoxia ([place])   I have reviewed the medical record, interviewed the patient and family, and examined the patient. The following aspects are pertinent.  Past Medical History:  Diagnosis Date  . Asthma   . Cancer ([place])   . Colon cancer ([place])   . COPD (chronic obstructive pulmonary disease) ([place])   . Diabetes mellitus without complication ([place])   . Difficulty sleeping   . Dysrhythmia   . GERD (gastroesophageal reflux disease)   . Glaucoma   . History of bronchitis   . Hypertension   . 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 Lymphoma ([facility])    low grade  . Renal cancer ([place])   . Respiratory failure ([place]) 07/12/2010   acute  . Right renal mass   . Shortness of breath dyspnea   . Stroke ([place]) 1990'S   NO RESIDUAL PROBLEMS  . Supplemental oxygen dependent      USES 3 LITERS CONTINUOUSLY   Social History   Social History  . Marital status: Single    Spouse name: N/A  . Number of children: N/A  . Years of education: N/A   Social History Main Topics  . Smoking status: Former Smoker    Packs/day: 1.00    Years: 30.00    Types: Cigarettes    Start date: 12/28/1968    Quit date: 01/13/2010  . Smokeless tobacco: Never Used  . Alcohol use No     Comment: stopped 2002   . Drug use: No  . Sexual activity: Not Currently   Other Topics Concern  . None   Social History Narrative  . None   Family History  Problem Relation Age of Onset  . Diabetes Mellitus II Mother   . Hypertension Mother   . Cancer Mother   . Diabetes Mother   . Tuberculosis Father   . Positive PPD/TB Exposure Father   . Diabetes Mellitus II Sister   .  Cancer Sister   . Sickle cell anemia Sister   . Prostate cancer Brother   . Cancer Brother   . Cancer Sister   . Cancer Maternal Aunt    Scheduled Meds: . budesonide  0.25 mg Nebulization Q6H  . chlorhexidine  15 mL Mouth Rinse BID  . feeding supplement (ENSURE ENLIVE)  1 Bottle Oral TID  . heparin subcutaneous  5,000 Units Subcutaneous Q8H  . insulin aspart  0-15 Units Subcutaneous Q4H  . ipratropium-albuterol  3 mL Nebulization Q6H  . mouth rinse  15 mL Mouth Rinse q12n4p  . methylPREDNISolone (SOLU-MEDROL) injection  80 mg Intravenous Q12H  . sodium chloride flush  3 mL Intravenous Q12H   Continuous Infusions: . sodium chloride 50 mL/hr at 06/03/17 1200  . ceFEPime (MAXIPIME) IV Stopped (06/03/17 0918)   PRN Meds:.acetaminophen **OR** acetaminophen, albuterol, bisacodyl, magnesium citrate, morphine injection, [DISCONTINUED] ondansetron **OR** ondansetron (ZOFRAN) IV, oxyCODONE, senna-docusate, zolpidem Medications Prior to Admission:  Prior to Admission medications   Medication Sig Start Date End Date Taking? Authorizing Provider  budesonide-formoterol (SYMBICORT) 160-4.5 MCG/ACT inhaler Inhale 2 puffs  into the lungs 2 (two) times daily.    Yes [provider]  feeding supplement, ENSURE ENLIVE, (ENSURE ENLIVE) LIQD Take 237 mLs by mouth 2 (two) times daily between meals. 03/02/17  Yes Bettey Costa, MD  HYDROcodone-acetaminophen (NORCO/VICODIN) 5-325 MG tablet Take 1 tablet by mouth every 8 (eight) hours as needed for moderate pain. Please Note new directions 05/29/17  Yes [name], MD  losartan-hydrochlorothiazide (HYZAAR) 50-12.5 MG per tablet Take 1 tablet by mouth daily.   Yes [provider]  Pediatric Multivitamins-Iron (MULTIPLE VITAMINS-IRON PO) Take by mouth. One a day   Yes [provider]  pioglitazone (ACTOS) 30 MG tablet Take 30 mg by mouth daily.   Yes [provider]  pioglitazone (ACTOS) 30 MG tablet TAKE 1 TABLET BY MOUTH EVERY DAY 01/18/17  Yes [provider]  predniSONE (DELTASONE) 10 MG tablet Prednisone 10 mg, 4 pills q day x 2 days, 3 pills q day x 2 days, 2 pills q day x 2 days, 1 pill q day x 2 days. 05/08/17  Yes [provider]  tiotropium (SPIRIVA HANDIHALER) 18 MCG inhalation capsule Place into inhaler and inhale. 03/26/17  Yes [provider]  tiotropium (SPIRIVA) 18 MCG inhalation capsule Place 18 mcg into inhaler and inhale daily.    Yes [provider]  zolpidem (AMBIEN) 10 MG tablet Take 10 mg by mouth at bedtime. 05/09/17  Yes [provider]  zolpidem (AMBIEN) 10 MG tablet TAKE 1 TABLET BY MOUTH NIGHTLY AS NEEDED 05/31/17  Yes [provider]  albuterol (PROVENTIL HFA;VENTOLIN HFA) 108 (90 BASE) MCG/ACT inhaler Inhale 2 puffs into the lungs every 6 (six) hours as needed for wheezing or shortness of breath.     [provider]  albuterol (PROVENTIL) (2.5 MG/3ML) 0.083% nebulizer solution USE 1 VIAL IN NEBULIZER EVERY 3-4 HOURS AS NEEDED 03/12/17   [name], MD  budesonide (PULMICORT) 0.25 MG/2ML nebulizer solution Take 2 mLs (0.25 mg total) by  nebulization 2 (two) times daily. 05/19/17 06/03/17  Vaughan Basta, MD  dextromethorphan (DELSYM) 30 MG/5ML liquid Take 5 mLs (30 mg total) by mouth 2 (two) times daily. Patient not taking: Reported on 05/28/2017 05/19/17   Vaughan Basta, MD  dextromethorphan-guaiFENesin [facility] DM) 30-600 MG 12hr tablet Take 1 tablet by mouth 2 (two) times daily. Patient not taking: Reported on 03/14/2017 02/01/17   Posey Pronto,  Sona, MD  diphenhydrAMINE (BENADRYL) 25 MG tablet Take 1 tablet (25 mg total) by mouth as needed for itching or allergies. Patient not taking: Reported on 05/28/2017 03/02/17   Bettey Costa, MD  guaiFENesin (MUCINEX) 600 MG 12 hr tablet Take 1 tablet (600 mg total) by mouth 2 (two) times daily. Patient not taking: Reported on 05/28/2017 05/19/17   Vaughan Basta, MD  predniSONE (STERAPRED UNI-PAK 21 TAB) 10 MG (21) TBPK tablet Take 6 tabs first day, 5 tab on day 2, then 4 on day 3rd, 3 tabs on day 4th , 2 tab on day 5th, and 1 tab on 6th day. Patient not taking: Reported on 05/28/2017 05/19/17   Vaughan Basta, MD   Allergies  Allergen Reactions  . Iodinated Diagnostic Agents Shortness Of Breath and Nausea And Vomiting    13-hour prep  . Nexium [Esomeprazole Magnesium] Other (See Comments)    headache   Review of Systems  Constitutional: Positive for activity change and appetite change.  Respiratory: Positive for shortness of breath.   Neurological: Positive for weakness.   Physical Exam  Constitutional: He is oriented to person, place, and time. He is cooperative. He appears ill.  HENT:  Head: Normocephalic and atraumatic.  Cardiovascular: Regular rhythm.   Pulmonary/Chest: Tachypnea noted. He has decreased breath sounds. He has wheezes.  Dyspnea at rest. Currently on BiPAP  Abdominal: Normal appearance.  Neurological: He is alert and oriented to person, place, and time.  Skin: Skin is warm and dry.  Psychiatric: He has a normal mood and affect. His speech is  normal and behavior is normal. Cognition and memory are normal.  Nursing note and vitals reviewed.  Vital Signs: BP 134/74   Pulse (!) 105   Temp 97.8 F (36.6 C) (Axillary)   Resp (!) 34   Ht _0  (1.727 m)   Wt 62.4 kg (137 lb 9.1 oz)   SpO2 97%   BMI 20.92 kg/m  Pain Assessment: CPOT POSS *See Group Information*: S-Acceptable,Sleep, easy to arouse Pain Score: 0-No pain  SpO2: SpO2: 97 % O2 Device:SpO2: 97 % O2 Flow Rate: .O2 Flow Rate (L/min): 50 L/min  IO: Intake/output summary:   Intake/Output Summary (Last 24 hours) at 06/03/17 1313 Last data filed at 06/03/17 1200  Gross per 24 hour  Intake          2275.83 ml  Output              575 ml  Net          1700.83 ml   LBM:   Baseline Weight: Weight: 61.2 kg (135 lb) Most recent weight: Weight: 62.4 kg (137 lb 9.1 oz)     Palliative Assessment/Data: PPS 30%   Flowsheet Rows     Most Recent Value  Intake Tab  Referral Department  Critical care  Unit at Time of Referral  ICU  Palliative Care Primary Diagnosis  Sepsis/Infectious Disease  Palliative Care Type  New Palliative care  Reason for referral  Clarify Goals of Care  Date first seen by Palliative Care  06/03/17  Clinical Assessment  Palliative Performance Scale Score  30%  Psychosocial & Spiritual Assessment  Palliative Care Outcomes  Patient/Family meeting held?  Yes  Who was at the meeting?  patient, daughter, sister, niece  Palliative Care Outcomes  Clarified goals of care, Provided end of life care assistance, Provided psychosocial or spiritual support, ACP counseling assistance, Counseled regarding hospice, Improved non-pain symptom therapy      Time  In: 0900   Time Out: 1030 Time Total: 77mn Greater than 50%  of this time was spent counseling and coordinating care related to the above assessment and plan.  Signed by:  MIhor Dow FNP-C Palliative Medicine Team  Phone: [phone]: [phone]  Please contact Palliative Medicine  Team phone at [phone] questions and concerns.  For individual provider: See AShea Evans

## 2017-06-03 NOTE — Progress Notes (Signed)
PT Cancellation Note  Patient Details Name: Craig [name]. MRN: [id number] DOB: September 25, 1951   Cancelled Treatment:    Reason Eval/Treat Not Completed: Medical issues which prohibited therapy. PT consult received. Chart reviewed. Pt's potassium 6.0. Per PT guidelines for elevated potassium will hold PT at this time. Will re-attempt PT at later date/time as appropriate.   Laqueta Carina, SPT 06/03/17,9:28 AM [phone]

## 2017-06-04 DIAGNOSIS — R0602 Shortness of breath: Secondary | ICD-10-CM

## 2017-06-04 DIAGNOSIS — J9620 Acute and chronic respiratory failure, unspecified whether with hypoxia or hypercapnia: Secondary | ICD-10-CM

## 2017-06-04 DIAGNOSIS — Z7189 Other specified counseling: Secondary | ICD-10-CM

## 2017-06-04 DIAGNOSIS — C3411 Malignant neoplasm of upper lobe, right bronchus or lung: Secondary | ICD-10-CM

## 2017-06-04 LAB — BASIC METABOLIC PANEL
Anion gap: 7 (ref 5–15)
BUN: 39 mg/dL — AB (ref 6–20)
CO2: 38 mmol/L — ABNORMAL HIGH (ref 22–32)
CREATININE: 1.15 mg/dL (ref 0.61–1.24)
Calcium: 8.7 mg/dL — ABNORMAL LOW (ref 8.9–10.3)
Chloride: 92 mmol/L — ABNORMAL LOW (ref 101–111)
GFR calc Af Amer: >60 mL/min (ref >60)
Glucose, Bld: 152 mg/dL — ABNORMAL HIGH (ref 65–99)
Potassium: 6.3 mmol/L (ref 3.5–5.1)
SODIUM: 137 mmol/L (ref 135–145)

## 2017-06-04 LAB — GLUCOSE, CAPILLARY
GLUCOSE-CAPILLARY: 151 mg/dL — AB (ref 65–99)
Glucose-Capillary: 161 mg/dL — ABNORMAL HIGH (ref 65–99)
Glucose-Capillary: 159 mg/dL — ABNORMAL HIGH (ref 65–99)

## 2017-06-04 LAB — PROCALCITONIN: Procalcitonin: 0.13 ng/mL

## 2017-06-04 MED ORDER — CEFUROXIME AXETIL 500 MG PO TABS
500.0000 mg | ORAL_TABLET | Freq: Two times a day (BID) | ORAL | Status: DC
Start: 2017-06-04 — End: 2017-06-06
  Administered 2017-06-04 – 2017-06-06 (×4): 500 mg via ORAL
  Filled 2017-06-04 (×5): qty 1

## 2017-06-04 MED ORDER — METHYLPREDNISOLONE SODIUM SUCC 40 MG IJ SOLR: 40.0000 mg | Freq: Two times a day (BID) | INTRAMUSCULAR | Status: DC

## 2017-06-04 MED ORDER — CEFUROXIME AXETIL 500 MG PO TABS: 500.0000 mg | ORAL_TABLET | Freq: Two times a day (BID) | ORAL | 0 refills | Status: DC

## 2017-06-04 MED ORDER — DEXTROSE 50 % IV SOLN
1.0000 | Freq: Once | INTRAVENOUS | Status: AC
  Administered 2017-06-04: 50 mL via INTRAVENOUS
  Filled 2017-06-04: qty 50

## 2017-06-04 MED ORDER — INSULIN ASPART 100 UNIT/ML IV SOLN
10.0000 [IU] | Freq: Once | INTRAVENOUS | Status: AC
  Administered 2017-06-04: 10 [IU] via INTRAVENOUS
  Filled 2017-06-04 (×3): qty 0.1

## 2017-06-04 MED ORDER — MORPHINE SULFATE (CONCENTRATE) 10 MG/0.5ML PO SOLN: 10.0000 mg | ORAL | Status: DC

## 2017-06-04 MED ORDER — GLYCOPYRROLATE 0.2 MG/ML IJ SOLN
0.2000 mg | INTRAMUSCULAR | Status: DC | PRN
  Administered 2017-06-05 – 2017-06-06 (×2): 0.2 mg via INTRAVENOUS
  Filled 2017-06-04 (×3): qty 1

## 2017-06-04 MED ORDER — PREDNISONE 50 MG PO TABS
60.0000 mg | ORAL_TABLET | Freq: Every day | ORAL | Status: DC
  Administered 2017-06-05 – 2017-06-06 (×2): 60 mg via ORAL
  Filled 2017-06-04 (×2): qty 1

## 2017-06-04 MED ORDER — PREDNISONE 10 MG (21) PO TBPK: ORAL_TABLET | ORAL | 0 refills | Status: DC

## 2017-06-04 MED ORDER — MORPHINE SULFATE (PF) 2 MG/ML IV SOLN
2.0000 mg | INTRAVENOUS | Status: DC | PRN
  Administered 2017-06-04 (×3): 4 mg via INTRAVENOUS
  Filled 2017-06-04 (×3): qty 2

## 2017-06-04 MED ORDER — SODIUM POLYSTYRENE SULFONATE 15 GM/60ML PO SUSP
15.0000 g | Freq: Once | ORAL | Status: AC
  Administered 2017-06-04: 15 g via ORAL
  Filled 2017-06-04: qty 60

## 2017-06-04 MED ORDER — MORPHINE SULFATE (CONCENTRATE) 10 MG/0.5ML PO SOLN
10.0000 mg | Freq: Four times a day (QID) | ORAL | Status: DC
  Administered 2017-06-04 (×2): 10 mg via SUBLINGUAL
  Filled 2017-06-04 (×4): qty 1

## 2017-06-04 NOTE — Progress Notes (Signed)
[facility] [facility] [place] NAME: Craig [name]    MR#:  [id number]  DATE OF BIRTH:  20-May-1951  SUBJECTIVE: Patient wanted to be off BiPAP, And wants hospice care.spoke with sister.     CHIEF COMPLAINT:   Chief Complaint  Patient presents with  . Respiratory Distress    REVIEW OF SYSTEMS:   ROS CONSTITUTIONAL: No fever, Has had lots of fatigue.,  Has Shortness of breath. EYES: No blurred or double vision.  EARS, NOSE, AND THROAT: No tinnitus or ear pain.  RESPIRATORY: Patient had decreased breath sounds bilaterally.  CARDIOVASCULAR: No chest pain, orthopnea, edema.  GASTROINTESTINAL: No nausea, vomiting, diarrhea or abdominal pain.  GENITOURINARY: No dysuria, hematuria.  ENDOCRINE: No polyuria, nocturia,  HEMATOLOGY: No anemia, easy bruising or bleeding SKIN: No rash or lesion. MUSCULOSKELETAL: No joint pain or arthritis.   NEUROLOGIC: No tingling, numbness, weakness.  PSYCHIATRY: No anxiety or depression.   DRUG ALLERGIES:   Allergies  Allergen Reactions  . Iodinated Diagnostic Agents Shortness Of Breath and Nausea And Vomiting    13-hour prep  . Nexium [Esomeprazole Magnesium] Other (See Comments)    headache    VITALS:  Blood pressure 130/75, pulse (!) 102, temperature 98 F (36.7 C), temperature source Axillary, resp. rate (!) 32, height 5\' 8"  (1.727 m), weight 62.4 kg (137 lb 9.1 oz), SpO2 97 %.  PHYSICAL EXAMINATION:  GENERAL:  66 y.o.-year-old patient lying in the bed with no acute distress.  EYES: Pupils equal, round, reactive to light and accommodation. No scleral icterus. Extraocular muscles intact.  HEENT: Head atraumatic, normocephalic. Oropharynx and nasopharynx clear.  NECK:  Supple, no jugular venous distention. No thyroid enlargement, no tenderness.  LUNGS:Ddereased breath sounds bilaterally.   CARDIOVASCULAR: S1, S2 normal. No murmurs, rubs, or gallops.  ABDOMEN: Soft, nontender, nondistended. Bowel  sounds present. No organomegaly or mass.  EXTREMITIES: No pedal edema, cyanosis, or clubbing.  NEUROLOGIC: Cranial nerves II through XII are intact. Muscle strength 5/5 in all extremities. Sensation intact. Gait not checked.  PSYCHIATRIC: The patient is alert and oriented x 3.  SKIN: No obvious rash, lesion, or ulcer.    LABORATORY PANEL:   CBC  Recent Labs Lab 06/03/17 0306  WBC 9.1  HGB 8.1*  HCT 24.7*  PLT 139*   ------------------------------------------------------------------------------------------------------------------  Chemistries   Recent Labs Lab 06/02/17 0419  06/03/17 0306  06/04/17 0341  NA 137  < > 134*  --  137  K 5.5*  < > 6.0*  < > 6.3*  CL 90*  < > 92*  --  92*  CO2 38*  < > 36*  --  38*  GLUCOSE 191*  < > 102*  --  152*  BUN 24*  < > 42*  --  39*  CREATININE 1.79*  < > 1.60*  --  1.15  CALCIUM 8.6*  < > 8.4*  --  8.7*  MG 1.8  --  1.7  --   --   AST 28  --   --   --   --   ALT 24  --   --   --   --   ALKPHOS 60  --   --   --   --   BILITOT 0.3  --   --   --   --   < > = values in this interval not displayed. ------------------------------------------------------------------------------------------------------------------  Cardiac Enzymes  Recent Labs Lab 06/01/17 2141  TROPONINI <0.03   ------------------------------------------------------------------------------------------------------------------  RADIOLOGY:  No results found.  EKG:   Orders placed or performed during the hospital encounter of 06/01/17  . ED EKG  . ED EKG  . EKG 12-Lead  . EKG 12-Lead    ASSESSMENT AND PLAN:   #1 COPD exacerbation: Acute respiratory failure on chronic respiratory failure with hypoxia andPatient refusing BiPAP, wants to be comfortable only and nasal cannula. Continue oral antibiotics, morphine, Ativan, oxygen via nasal cannula, palliative care is consulted to see if he can go home with hospice tomorrow.   3 diabetes mellitus type 2:  Continue SSI with coverage. #4 severe hyperkalemia Continue comfort measures, continue oral antibiotics, bronchodilators, palliative care consulted and possible discharge home with hospice. D/w sister   All the records are reviewed and case discussed with Care Management/Social Workerr. Management plans discussed with the patient, family and they are in agreement.  CODE STATUS:DNR  TOTAL TIME TAKING CARE OF THIS PATIENT: 35 min minutes.   POSSIBLE D/C IN 3-4 DAYS, DEPENDING ON CLINICAL CONDITION.   Craig [name] M.D on 06/04/2017 at 1:32 PM  Between 7am to 6pm - Pager - [phone]  After 6pm go to [email or web address] - password [email or web address] [facility]  Office  [phone]  CC: Primary care physician; Craig Pitch, MD   Note: This dictation was prepared with Dragon dictation along with smaller phrase technology. Any transcriptional errors that result from this process are unintentional.

## 2017-06-04 NOTE — Progress Notes (Addendum)
Report called to [facility], RN on 1C. Patient transported to room 129 on O2 at 6 lpm via hospital bed by this writer. 1C staff was notified of this patient's arrival to this room.

## 2017-06-04 NOTE — Progress Notes (Signed)
Still requiring BiPAP intermittently. However, he expresses a desire to get it off and not resume it. He voices a desire to "go to hospice". He has no new complaints. Remains dyspneic at rest. Denies pain.  Vitals:   06/04/17 0900 06/04/17 1000 06/04/17 1100 06/04/17 1200  BP: 111/62 134/77 (!) 147/75 130/75  Pulse: 94 (!) 110 (!) 111 (!) 102  Resp: (!) 29 (!) 34 (!) 29 (!) 32  Temp:      TempSrc:      SpO2: 90% 100% 98% 97%  Weight:      Height:        Cognition intact Mildly labored at rest HEENT WNL No JVD Scattered coarse wheezes Tachycardia, regular, no murmurs NABS, NT No edema No focal deficits  BMP Latest Ref Rng & Units 06/04/2017 06/03/2017 06/03/2017  Glucose 65 - 99 mg/dL 152(H) - 102(H)  BUN 6 - 20 mg/dL 39(H) - 42(H)  Creatinine 0.61 - 1.24 mg/dL 1.15 - 1.60(H)  Sodium 135 - 145 mmol/L 137 - 134(L)  Potassium 3.5 - 5.1 mmol/L 6.3(HH) 5.6(H) 6.0(H)  Chloride 101 - 111 mmol/L 92(L) - 92(L)  CO2 22 - 32 mmol/L 38(H) - 36(H)  Calcium 8.9 - 10.3 mg/dL 8.7(L) - 8.4(L)   CBC Latest Ref Rng & Units 06/03/2017 06/02/2017 06/01/2017  WBC 3.8 - 10.6 K/uL 9.1 9.3 6.7  Hemoglobin 13.0 - 18.0 g/dL 8.1(L) 9.9(L) 9.8(L)  Hematocrit 40.0 - 52.0 % 24.7(L) 30.9(L) 30.9(L)  Platelets 150 - 440 K/uL 139(L) 144(L) 156   No new CXR  IMPRESSION: Advanced lung cancer Severe emphysema Acute exacerbation COPD Hyperkalemia AKI - resolved  DISCUSSION: The patient expressed a desire to discontinue noninvasive ventilatory support and focus on comfort. He wishes to go home with hospice as soon as possible. We discussed which therapies might still be appropriate for him.  PLAN/REC: Change antibiotics and steroids to by mouth Continue nebulized steroids and bronchodilators Continue supplemental oxygen Morphine as needed for shortness of breath Discontinue BiPAP Discussed with palliative care - goal of home with hospice Transfer to MedSurg After transfer, PCCM will sign off. Please  call if we can be of further assistance  Merton Border, MD PCCM service Mobile [phone] Pager [phone] 06/04/2017 1:29 PM

## 2017-06-04 NOTE — Progress Notes (Addendum)
Daily Progress Note   Patient Name: Craig [name].       Date: 06/04/2017 DOB: 1950/12/18  Age: 66 y.o. MRN#: [id number] Attending Physician: [name], MD Primary Care Physician: [name], MD Admit Date: 06/01/2017  Reason for Consultation/Follow-up: Establishing goals of care  Subjective/GOC: Patient resting upon arrival to room. Maintaining oxygen saturation on 6L [place]. No s/s of discomfort.   Spoke with sister, Katharine Look, outside of room. She tells me the patient texted her yesterday and stated "it's time for hospice." He has requested discontinuation of BiPAP and focus on comfort. He wants to go home with hospice. Discussed hospice options--home versus inpatient hospice facility. Katharine Look wants to honor his wishes and take him home. The girlfriend is with him majority of the day and able to call hospice if he becomes distressed. Discussed focus on comfort, quality, and dignity at the EOL. Educated on medications as needed to control symptoms. Katharine Look has notified family to come and visit. Daughter will be back in town on Friday. Katharine Look has a good friend that works with [facility] and requesting their services.   Therapeutic listening and emotional support provided.   Length of Stay: 2  Current Medications: Scheduled Meds:  . budesonide  0.25 mg Nebulization Q6H  . cefUROXime  500 mg Oral BID WC  . chlorhexidine  15 mL Mouth Rinse BID  . feeding supplement (ENSURE ENLIVE)  1 Bottle Oral TID  . heparin subcutaneous  5,000 Units Subcutaneous Q8H  . ipratropium-albuterol  3 mL Nebulization Q6H  . mouth rinse  15 mL Mouth Rinse q12n4p  . [START ON 06/05/2017] predniSONE  60 mg Oral Q breakfast  . sodium chloride flush  3 mL Intravenous Q12H    Continuous  Infusions:   PRN Meds: acetaminophen **OR** acetaminophen, albuterol, bisacodyl, magnesium citrate, morphine injection, [DISCONTINUED] ondansetron **OR** ondansetron (ZOFRAN) IV, oxyCODONE, senna-docusate, sodium chloride flush, zolpidem  Physical Exam  Constitutional: He is oriented to person, place, and time. He is easily aroused. He appears ill.  HENT:  Head: Normocephalic and atraumatic.  Cardiovascular: Regular rhythm.   Pulmonary/Chest: No accessory muscle usage. No tachypnea. No respiratory distress. He has decreased breath sounds. He has wheezes.  Resting on 6L [place]  Neurological: He is alert, oriented to person, place, and time and easily aroused.  Skin: Skin  is warm and dry.  Nursing note and vitals reviewed.          Vital Signs: BP 130/75   Pulse (!) 102   Temp 98 F (36.7 C) (Axillary)   Resp (!) 32   Ht 5\' 8"  (1.727 m)   Wt 62.4 kg (137 lb 9.1 oz)   SpO2 97%   BMI 20.92 kg/m  SpO2: SpO2: 97 % O2 Device: O2 Device: Bi-PAP O2 Flow Rate: O2 Flow Rate (L/min): 45 L/min  Intake/output summary:  Intake/Output Summary (Last 24 hours) at 06/04/17 1326 Last data filed at 06/04/17 0930  Gross per 24 hour  Intake             1220 ml  Output             2450 ml  Net            -1230 ml   LBM:   Baseline Weight: Weight: 61.2 kg (135 lb) Most recent weight: Weight: 62.4 kg (137 lb 9.1 oz)       Palliative Assessment/Data: PPS 30%   Flowsheet Rows     Most Recent Value  Intake Tab  Referral Department  Critical care  Unit at Time of Referral  ICU  Palliative Care Primary Diagnosis  Sepsis/Infectious Disease  Palliative Care Type  New Palliative care  Reason for referral  Clarify Goals of Care  Date first seen by Palliative Care  06/03/17  Clinical Assessment  Palliative Performance Scale Score  30%  Psychosocial & Spiritual Assessment  Palliative Care Outcomes  Patient/Family meeting held?  Yes  Who was at the meeting?  patient, daughter, sister, niece    Palliative Care Outcomes  Clarified goals of care, Provided end of life care assistance, Provided psychosocial or spiritual support, ACP counseling assistance, Counseled regarding hospice, Improved non-pain symptom therapy      Patient Active Problem List   Diagnosis Date Noted  . Pneumonia due to infectious organism   . Palliative care by specialist   . Goals of care, counseling/discussion   . Acute respiratory failure with hypoxia and hypercapnia ([place]) 06/02/2017  . Protein-calorie malnutrition, severe 03/02/2017  . Sepsis ([place]) 12/07/2016  . Portacath in place 11/05/2016  . Cancer associated pain 08/22/2016  . Anemia in neoplastic disease 08/22/2016  . Tachycardia 08/22/2016  . 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 Chronic bronchitis ([place]) 06/02/2016  . Chronic renal insufficiency 06/02/2016  . Benign essential HTN 06/02/2016  . Diabetes ([place]) 06/02/2016  . Primary cancer of right upper lobe of lung ([place]) 06/01/2016  . Renal carcinoma ([place]) 02/29/2016  . Cancer of lung ([place]) 12/21/2015  . COPD exacerbation ([place]) 11/19/2015  . Acute bronchitis 11/19/2015  . SOB (shortness of breath) 11/19/2015  . Acute respiratory distress 11/19/2015  . Glaucoma 04/15/2015  . 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 BP (high blood pressure) 03/02/2015  . Cannot sleep 03/02/2015  . Controlled type 2 diabetes mellitus without complication ([place]) 93/26/7124  . Lung cancer, upper lobe ([place]) 02/03/2015  . Renal cell cancer ([place]) 02/03/2015  . 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 male  with past medical history of COPD, oxygen dependent, stage IV lung cancer, colon and renal cancer, hypertension, GERD, diabetes mellitus, and CVA admitted on 06/01/2017 with respiratory distress. Recent hospitalization for sepsis, HCAP, and COPD exacerbation. Hx of stage IV lung Cancer was recently on ibrutibib. Oncology  notes  reviewed. Unable to perform CTA to r/o PE due to worsening kidney function. Currently on BiPAP. Receiving antibiotics, nebulized bronchodilators, steroids for recurrent RLL pneumonia and COPD exacerbation. Palliative medicine consultation for goals of care.   Assessment: Acute on chronic respiratory failure COPD exacerbation Stage IV lung cancer Diabetes mellitus Severe hyperkalemia  Recommendations/Plan:  DNR/DNI  Focus on comfort and symptom management with goal of getting patient home with hospice services. If he declines over night, he will be eligible for inpatient hospice.  Scheduled Roxanol 10mg  SL q6h. Continue Morphine 2-4mg  IV as needed for pain/dyspnea/air hunger.   RN CM notified of request for home hospice services.   Goals of Care and Additional Recommendations:  Comfort focus. No escalation of care. Treat dyspnea with morphine. Continue bronchodilators, steroids, and antibiotics.   Code Status: DNR/DNI   Code Status Orders        Start     Ordered   06/02/17 0947  Do not attempt resuscitation (DNR)  Continuous    Question Answer Comment  In the event of cardiac or respiratory ARREST Do not call a "code blue"   In the event of cardiac or respiratory ARREST Do not perform Intubation, CPR, defibrillation or ACLS   In the event of cardiac or respiratory ARREST Use medication by any route, position, wound care, and other measures to relive pain and suffering. May use oxygen, suction and manual treatment of airway obstruction as needed for comfort.      06/02/17 0946    Code Status History    Date Active Date Inactive Code Status Order ID Comments User Context   06/02/2017  3:03 AM 06/02/2017  9:46 AM Full Code [id number]  Hugelmeyer, Ubaldo Glassing, DO Inpatient   05/16/2017  4:45 PM 05/19/2017  5:38 PM Full Code [id number]  Nicholes Mango, MD ED   02/28/2017 11:26 AM 03/02/2017  1:13 PM Full Code [id number]  [name], MD ED   01/30/2017  6:19 PM 02/01/2017  3:17 PM  Full Code [id number]  Hillary Bow, MD ED   12/07/2016  4:43 PM 12/10/2016  8:50 PM Full Code [id number]  Bettey Costa, MD Inpatient   07/02/2016  3:01 PM 07/03/2016  3:55 PM DNR [id number]  Hillary Bow, MD ED   05/31/2016  6:48 PM 06/02/2016  3:41 PM DNR [id number]  Nicholes Mango, MD Inpatient   11/19/2015  7:53 PM 11/20/2015  2:12 PM Full Code [id number]  Idelle Crouch, MD Inpatient    Advance Directive Documentation     Most Recent Value  Type of Advance Directive  Healthcare Power of [place], Living will  Pre-existing out of facility DNR order (yellow form or pink MOST form)  -  "MOST" Form in Place?  -       Prognosis:   < 6 weeks: if not less with acute on chronic respiratory secondary to severe COPD, pneumonia, and stage IV lung cancer. Requiring frequent morphine for air hunger/dyspnea.   Discharge Planning:  Home with Hospice  Care plan was discussed with patient, RN, sister, Dr. Alva Garnet  Thank you for allowing the Palliative Medicine Team to assist in the care of this patient.   Time In: 1300 Time Out: 1345 Total Time 73min Prolonged Time Billed  no      Greater than 50%  of this time was spent counseling and coordinating care related to the above assessment and plan.  Ihor Dow, FNP-C Palliative Medicine Team  Phone: [phone] Fax: [phone]  Please contact Palliative Medicine Team phone at [phone]  for questions and concerns.

## 2017-06-04 NOTE — Progress Notes (Signed)
PT Cancellation Note  Patient Details Name: Craig [name]. MRN: [id number] DOB: 1951/08/17   Cancelled Treatment:    Reason Eval/Treat Not Completed: Medical issues which prohibited therapy. PT consult received. Chart reviewed. Pt's potassium 6.3. Per PT guidelines for elevated potassium will hold PT at this time. Will re-attempt PT at later date/time as appropriate.   Laqueta Carina, SPT 06/04/17,8:12 AM [phone]

## 2017-06-04 NOTE — Progress Notes (Signed)
Received pt to 129 via bed from ccu 9 alert.dyspnea on exertion and at rest.

## 2017-06-04 NOTE — Plan of Care (Signed)
Problem: Safety: Goal: Ability to remain free from injury will improve Alarm on pt instructed  To call for assistance  Problem: Pain Managment: Goal: General experience of comfort will improve Outcome: Progressing No pain on arrival to unit but meds given for  Dyspnea with ease of dyspnea  Problem: Nutrition: Goal: Adequate nutrition will be maintained Outcome: Not Progressing Poor appetite  Ate a little more today  And pt likes strawberry  Ensure and drank one on arrival to floor  Problem: Bowel/Gastric: Goal: Will not experience complications related to bowel motility Outcome: Progressing Pt reports bm on sat prior to admission aug 18th  Lax given

## 2017-06-05 ENCOUNTER — Other Ambulatory Visit: Payer: Self-pay | Admitting: Pharmacist

## 2017-06-05 ENCOUNTER — Ambulatory Visit: Payer: Self-pay | Admitting: *Deleted

## 2017-06-05 DIAGNOSIS — R0603 Acute respiratory distress: Secondary | ICD-10-CM

## 2017-06-05 LAB — BASIC METABOLIC PANEL
Anion gap: 8 (ref 5–15)
BUN: 31 mg/dL — AB (ref 6–20)
CHLORIDE: 91 mmol/L — AB (ref 101–111)
CO2: 43 mmol/L — AB (ref 22–32)
CREATININE: 1 mg/dL (ref 0.61–1.24)
Calcium: 9.2 mg/dL (ref 8.9–10.3)
GFR calc non Af Amer: >60 mL/min (ref >60)
GLUCOSE: 212 mg/dL — AB (ref 65–99)
Potassium: 4.9 mmol/L (ref 3.5–5.1)
SODIUM: 142 mmol/L (ref 135–145)

## 2017-06-05 MED ORDER — HEPARIN SOD (PORK) LOCK FLUSH 100 UNIT/ML IV SOLN
500.0000 [IU] | INTRAVENOUS | Status: AC | PRN
  Administered 2017-06-06: 500 [IU]
  Filled 2017-06-05: qty 5

## 2017-06-05 MED ORDER — MORPHINE SULFATE (CONCENTRATE) 10 MG/0.5ML PO SOLN
5.0000 mg | Freq: Four times a day (QID) | ORAL | Status: DC
  Administered 2017-06-05 – 2017-06-06 (×3): 5 mg via SUBLINGUAL
  Filled 2017-06-05 (×3): qty 1

## 2017-06-05 MED ORDER — BUDESONIDE 0.25 MG/2ML IN SUSP: 0.2500 mg | Freq: Two times a day (BID) | RESPIRATORY_TRACT | 0 refills | Status: AC

## 2017-06-05 MED ORDER — ALBUTEROL SULFATE (2.5 MG/3ML) 0.083% IN NEBU: 3.0000 mL | INHALATION_SOLUTION | Freq: Four times a day (QID) | RESPIRATORY_TRACT | 0 refills | Status: AC

## 2017-06-05 MED ORDER — CEFUROXIME AXETIL 500 MG PO TABS: 500.0000 mg | ORAL_TABLET | Freq: Two times a day (BID) | ORAL | 0 refills | Status: AC

## 2017-06-05 MED ORDER — PREDNISONE 10 MG PO TABS: ORAL_TABLET | ORAL | 0 refills | Status: AC

## 2017-06-05 MED ORDER — MORPHINE SULFATE (PF) 2 MG/ML IV SOLN
2.0000 mg | INTRAVENOUS | Status: DC | PRN
  Administered 2017-06-05: 2 mg via INTRAVENOUS
  Filled 2017-06-05: qty 1

## 2017-06-05 MED ORDER — MORPHINE SULFATE (CONCENTRATE) 10 MG/0.5ML PO SOLN
10.0000 mg | ORAL | Status: DC | PRN
  Administered 2017-06-05 – 2017-06-06 (×2): 10 mg via SUBLINGUAL
  Filled 2017-06-05 (×2): qty 1

## 2017-06-05 MED ORDER — DOCUSATE SODIUM 100 MG PO CAPS
100.0000 mg | ORAL_CAPSULE | Freq: Two times a day (BID) | ORAL | Status: DC
  Administered 2017-06-05 – 2017-06-06 (×3): 100 mg via ORAL
  Filled 2017-06-05 (×3): qty 1

## 2017-06-05 MED ORDER — MORPHINE SULFATE (CONCENTRATE) 10 MG/0.5ML PO SOLN: 10.0000 mg | ORAL | 0 refills | Status: AC | PRN

## 2017-06-05 NOTE — Progress Notes (Signed)
Patient ID: Craig Falco., male   DOB: 1951-02-21, 66 y.o.   MRN: [id number]  [employer] PROGRESS NOTE  Craig Falco. DXA:[id number] DOB: 1951/02/16 DOA: 06/01/2017 PCP: [name], MD  HPI/Subjective: Patient would like to go home. Audible wheezing heard. Patient's sister called me back this afternoon and stated that his home is not appropriate for him to go back today. They would like time to clean it up today in order for him to go home tomorrow. Patient has made a decision to go home with hospice. Patient would like the Foley catheter out.  Objective: Vitals:   06/05/17 0336 06/05/17 1330  BP: (!) 150/74 128/62  Pulse: (!) 108 (!) 118  Resp: (!) 24 (!) 22  Temp: 97.9 F (36.6 C) 98.1 F (36.7 C)  SpO2: 97% 98%    Filed Weights   06/01/17 2145 06/02/17 0200 06/02/17 2000  Weight: 61.2 kg (135 lb) 58.8 kg (129 lb 10.1 oz) 62.4 kg (137 lb 9.1 oz)    ROS: Review of Systems  Constitutional: Negative for chills and fever.  Eyes: Negative for blurred vision.  Respiratory: Positive for cough, shortness of breath and wheezing.   Cardiovascular: Negative for chest pain.  Gastrointestinal: Positive for constipation. Negative for abdominal pain, diarrhea, nausea and vomiting.  Genitourinary: Negative for dysuria.  Musculoskeletal: Negative for joint pain.  Neurological: Negative for dizziness and headaches.   Exam: Physical Exam  Constitutional: He is oriented to person, place, and time.  HENT:  Nose: No mucosal edema.  Mouth/Throat: No oropharyngeal exudate or posterior oropharyngeal edema.  Eyes: Pupils are equal, round, and reactive to light. Conjunctivae, EOM and lids are normal.  Neck: No JVD present. Carotid bruit is not present. No edema present. No thyroid mass and no thyromegaly present.  Cardiovascular: S1 normal and S2 normal.  Exam reveals no gallop.   No murmur heard. Pulses:      Dorsalis pedis pulses are 2+ on the right side, and 2+ on the  left side.  Respiratory: No respiratory distress. He has decreased breath sounds in the right middle field, the right lower field, the left middle field and the left lower field. He has wheezes in the right middle field, the right lower field, the left middle field and the left lower field. He has no rhonchi. He has no rales.  GI: Soft. Bowel sounds are normal. There is no tenderness.  Musculoskeletal:       Right shoulder: He exhibits no swelling.  Lymphadenopathy:    He has no cervical adenopathy.  Neurological: He is alert and oriented to person, place, and time. No cranial nerve deficit.  Skin: Skin is warm. No rash noted. Nails show no clubbing.  Psychiatric: He has a normal mood and affect.      Data Reviewed: Basic Metabolic Panel:  Recent Labs Lab 06/02/17 0419 06/02/17 1708 06/03/17 0306 06/03/17 1800 06/04/17 0341 06/05/17 0815  NA 137 133* 134*  --  137 142  K 5.5* 5.4* 6.0* 5.6* 6.3* 4.9  CL 90* 89* 92*  --  92* 91*  CO2 38* 34* 36*  --  38* 43*  GLUCOSE 191* 132* 102*  --  152* 212*  BUN 24* 34* 42*  --  39* 31*  CREATININE 1.79* 1.99* 1.60*  --  1.15 1.00  CALCIUM 8.6* 8.0* 8.4*  --  8.7* 9.2  MG 1.8  --  1.7  --   --   --   PHOS 4.6  --  2.9  --   --   --    Liver Function Tests:  Recent Labs Lab 06/01/17 2141 06/02/17 0419  AST 22 28  ALT 16* 24  ALKPHOS 58 60  BILITOT 0.4 0.3  PROT 6.5 6.7  ALBUMIN 3.7 3.7   CBC:  Recent Labs Lab 06/01/17 2141 06/02/17 0419 06/03/17 0306  WBC 6.7 9.3 9.1  NEUTROABS 4.1  --   --   HGB 9.8* 9.9* 8.1*  HCT 30.9* 30.9* 24.7*  MCV 91.3 92.4 90.9  PLT 156 144* 139*   Cardiac Enzymes:  Recent Labs Lab 06/01/17 2141  TROPONINI <0.03   BNP (last 3 results)  Recent Labs  01/30/17 1511 06/01/17 2141  BNP 58.0 37.0     CBG:  Recent Labs Lab 06/03/17 1816 06/03/17 2003 06/03/17 2308 06/04/17 0432 06/04/17 0745  GLUCAP 225* 187* 151* 161* 159*    Recent Results (from the past 240 hour(s))   MRSA PCR Screening     Status: None   Collection Time: 06/02/17  2:35 AM  Result Value Ref Range Status   MRSA by PCR NEGATIVE NEGATIVE Final    Comment:        The GeneXpert MRSA Assay (FDA approved for NASAL specimens only), is one component of a comprehensive MRSA colonization surveillance program. It is not intended to diagnose MRSA infection nor to guide or monitor treatment for MRSA infections.       Scheduled Meds: . budesonide  0.25 mg Nebulization Q6H  . cefUROXime  500 mg Oral BID WC  . chlorhexidine  15 mL Mouth Rinse BID  . docusate sodium  100 mg Oral BID  . feeding supplement (ENSURE ENLIVE)  1 Bottle Oral TID  . heparin subcutaneous  5,000 Units Subcutaneous Q8H  . ipratropium-albuterol  3 mL Nebulization Q6H  . mouth rinse  15 mL Mouth Rinse q12n4p  . morphine CONCENTRATE  5 mg Sublingual Q6H  . predniSONE  60 mg Oral Q breakfast  . sodium chloride flush  3 mL Intravenous Q12H    Assessment/Plan:  1. Acute on chronic respiratory failure. Continue oxygen supplementation. Spoke with nursing staff to try to taper oxygen further. 2. COPD exacerbation with audible wheeze. Continue prednisone and nebulizer treatment 3. History of lung cancer 4. Hyperkalemia. Improved 5. Acute kidney injury improved  Case discussed with patient and family. Patient would like to go home with hospice as soon as possible. Family would like to clean his house prior to going home. Plan on disposition tomorrow with hospice.  Code Status:     Code Status Orders        Start     Ordered   06/02/17 0947  Do not attempt resuscitation (DNR)  Continuous    Question Answer Comment  In the event of cardiac or respiratory ARREST Do not call a "code blue"   In the event of cardiac or respiratory ARREST Do not perform Intubation, CPR, defibrillation or ACLS   In the event of cardiac or respiratory ARREST Use medication by any route, position, wound care, and other measures to relive  pain and suffering. May use oxygen, suction and manual treatment of airway obstruction as needed for comfort.      06/02/17 0946    Code Status History    Date Active Date Inactive Code Status Order ID Comments User Context   06/02/2017  3:03 AM 06/02/2017  9:46 AM Full Code [id number]  Harvie Bridge, DO Inpatient   05/16/2017  4:45 PM  05/19/2017  5:38 PM Full Code [id number]  Nicholes Mango, MD ED   02/28/2017 11:26 AM 03/02/2017  1:13 PM Full Code [id number]  [name], MD ED   01/30/2017  6:19 PM 02/01/2017  3:17 PM Full Code [id number]  Hillary Bow, MD ED   12/07/2016  4:43 PM 12/10/2016  8:50 PM Full Code [id number]  Bettey Costa, MD Inpatient   07/02/2016  3:01 PM 07/03/2016  3:55 PM DNR [id number]  Hillary Bow, MD ED   05/31/2016  6:48 PM 06/02/2016  3:41 PM DNR [id number]  Nicholes Mango, MD Inpatient   11/19/2015  7:53 PM 11/20/2015  2:12 PM Full Code [id number]  Idelle Crouch, MD Inpatient    Advance Directive Documentation     Most Recent Value  Type of Advance Directive  Healthcare Power of Attorney, Living will  Pre-existing out of facility DNR order (yellow form or pink MOST form)  -  "MOST" Form in Place?  -     Family Communication: Family at the bedside and sister on the phone Disposition Plan: Home tomorrow with hospice  Consultants:  Critical care specialist  Palliative care  Time spent: 28 minutes  Loletha Grayer  [employer]

## 2017-06-05 NOTE — Care Management Important Message (Signed)
Important Message  Patient Details  Name: Craig [name]. MRN: [id number] Date of Birth: March 18, 1951   Medicare Important Message Given:  Yes    Shelbie Ammons, RN 06/05/2017, 8:08 AM

## 2017-06-05 NOTE — Consult Note (Signed)
   [facility] CM Inpatient Consult   06/05/2017  Craig [name]. 10/03/1951 [id number]   Patient is currently active with [facility] for chronic disease management services.  Patient has been engaged by a Geologist, engineering and [facility] Pharmacist.  Electronic medical record reveals patient's discharge plan is home with hospice services. [facility] Care Management services not appropriate at this time and services will be closed appropriately.  If patient's post hospital needs change please place a [facility] Care Management consult. For questions please contact:   Fadumo Heng RN, [facility] Liaison  [phone]) Business Mobile [phone]) Toll free office

## 2017-06-05 NOTE — Progress Notes (Signed)
8/23 CBC ordered per anticoagulation monitoring protocol, CBC to be checked q3days.   Thomasenia Sales, PharmD, MBA, [facility]

## 2017-06-05 NOTE — Discharge Instructions (Signed)
Acute Respiratory Failure, Adult Acute respiratory failure occurs when there is not enough oxygen passing from your lungs to your body. When this happens, your lungs have trouble removing carbon dioxide from the blood. This causes your blood oxygen level to drop too low as carbon dioxide builds up. Acute respiratory failure is a medical emergency. It can develop quickly, but it is temporary if treated promptly. Your lung capacity, or how much air your lungs can hold, may improve with time, exercise, and treatment. What are the causes? There are many possible causes of acute respiratory failure, including:  Lung injury.  Chest injury or damage to the ribs or tissues near the lungs.  Lung conditions that affect the flow of air and blood into and out of the lungs, such as pneumonia, acute respiratory distress syndrome, and cystic fibrosis.  Medical conditions, such as strokes or spinal cord injuries, that affect the muscles and nerves that control breathing.  Blood infection (sepsis).  Inflammation of the pancreas (pancreatitis).  A blood clot in the lungs (pulmonary embolism).  A large-volume blood transfusion.  Burns.  Near-drowning.  Seizure.  Smoke inhalation.  Reaction to medicines.  Alcohol or drug overdose.  What increases the risk? This condition is more likely to develop in people who have:  A blocked airway.  Asthma.  A condition or disease that damages or weakens the muscles, nerves, bones, or tissues that are involved in breathing.  A serious infection.  A health problem that blocks the unconscious reflex that is involved in breathing, such as hypothyroidism or sleep apnea.  A lung injury or trauma.  What are the signs or symptoms? Trouble breathing is the main symptom of acute respiratory failure. Symptoms may also include:  Rapid breathing.  Restlessness or anxiety.  Skin, lips, or fingernails that appear blue (cyanosis).  Rapid heart  rate.  Abnormal heart rhythms (arrhythmias).  Confusion or changes in behavior.  Tiredness or loss of energy.  Feeling sleepy or having a loss of consciousness.  How is this diagnosed? Your health care provider can diagnose acute respiratory failure with a medical history and physical exam. During the exam, your health care provider will listen to your heart and check for crackling or wheezing sounds in your lungs. Your may also have tests to confirm the diagnosis and determine what is causing respiratory failure. These tests may include:  Measuring the amount of oxygen in your blood (pulse oximetry). The measurement comes from a small device that is placed on your finger, earlobe, or toe.  Other blood tests to measure blood gases and to look for signs of infection.  Sampling your cerebral spinal fluid or tracheal fluid to check for infections.  Chest X-ray to look for fluid in spaces that should be filled with air.  Electrocardiogram (ECG) to look at the heart's electrical activity.  How is this treated? Treatment for this condition usually takes places in a hospital intensive care unit (ICU). Treatment depends on what is causing the condition. It may include one or more treatments until your symptoms improve. Treatment may include:  Supplemental oxygen. Extra oxygen is given through a tube in the nose, a face mask, or a hood.  A device such as a continuous positive airway pressure (CPAP) or bi-level positive airway pressure (BiPAP or BPAP) machine. This treatment uses mild air pressure to keep the airways open. A mask or other device will be placed over your nose or mouth. A tube that is connected to a motor will deliver  oxygen through the mask.  Ventilator. This treatment helps move air into and out of the lungs. This may be done with a bag and mask or a machine. For this treatment, a tube is placed in your windpipe (trachea) so air and oxygen can flow to the lungs.  Extracorporeal  membrane oxygenation (ECMO). This treatment temporarily takes over the function of the heart and lungs, supplying oxygen and removing carbon dioxide. ECMO gives the lungs a chance to recover. It may be used if a ventilator is not effective.  Tracheostomy. This is a procedure that creates a hole in the neck to insert a breathing tube.  Receiving fluids and medicines.  Rocking the bed to help breathing.  Follow these instructions at home:  Take over-the-counter and prescription medicines only as told by your health care provider.  Return to normal activities as told by your health care provider. Ask your health care provider what activities are safe for you.  Keep all follow-up visits as told by your health care provider. This is important. How is this prevented? Treating infections and medical conditions that may lead to acute respiratory failure can help prevent the condition from developing. Contact a health care provider if:  You have a fever.  Your symptoms do not improve or they get worse. Get help right away if:  You are having trouble breathing.  You lose consciousness.  Your have cyanosis or turn blue.  You develop a rapid heart rate.  You are confused. These symptoms may represent a serious problem that is an emergency. Do not wait to see if the symptoms will go away. Get medical help right away. Call your local emergency services (911 in the [place] Do not drive yourself to the hospital. This information is not intended to replace advice given to you by your health care provider. Make sure you discuss any questions you have with your health care provider. Document Released: 10/06/2013 Document Revised: 04/28/2016 Document Reviewed: 04/18/2016 Elsevier Interactive Patient Education  2018 [employer] Inc.   Chronic Obstructive Pulmonary Disease Chronic obstructive pulmonary disease (COPD) is a long-term (chronic) lung problem. When you have COPD, it is hard for air to get  in and out of your lungs. The way your lungs work will never return to normal. Usually the condition gets worse over time. There are things you can do to keep yourself as healthy as possible. Your doctor may treat your condition with:  Medicines.  Quitting smoking, if you smoke.  Rehabilitation. This may involve a team of specialists.  Oxygen.  Exercise and changes to your diet.  Lung surgery.  Comfort measures (palliative care).  Follow these instructions at home: Medicines  Take over-the-counter and prescription medicines only as told by your doctor.  Talk to your doctor before taking any cough or allergy medicines. You may need to avoid medicines that cause your lungs to be dry. Lifestyle  If you smoke, stop. Smoking makes the problem worse. If you need help quitting, ask your doctor.  Avoid being around things that make your breathing worse. This may include smoke, chemicals, and fumes.  Stay active, but remember to also rest.  Learn and use tips on how to relax.  Make sure you get enough sleep. Most adults need at least 7 hours a night.  Eat healthy foods. Eat smaller meals more often. Rest before meals. Controlled breathing  Learn and use tips on how to control your breathing as told by your doctor. Try: ? Breathing in (inhaling) through  your nose for 1 second. Then, pucker your lips and breath out (exhale) through your lips for 2 seconds. ? Putting one hand on your belly (abdomen). Breathe in slowly through your nose for 1 second. Your hand on your belly should move out. Pucker your lips and breathe out slowly through your lips. Your hand on your belly should move in as you breathe out. Controlled coughing  Learn and use controlled coughing to clear mucus from your lungs. The steps are: 1. Lean your head a little forward. 2. Breathe in deeply. 3. Try to hold your breath for 3 seconds. 4. Keep your mouth slightly open while coughing 2 times. 5. Spit any mucus out  into a tissue. 6. Rest and do the steps again 1 or 2 times as needed. General instructions  Make sure you get all the shots (vaccines) that your doctor recommends. Ask your doctor about a flu shot and a pneumonia shot.  Use oxygen therapy and therapy to help improve your lungs (pulmonary rehabilitation) if told by your doctor. If you need home oxygen therapy, ask your doctor if you should buy a tool to measure your oxygen level (oximeter).  Make a COPD action plan with your doctor. This helps you know what to do if you feel worse than usual.  Manage any other conditions you have as told by your doctor.  Avoid going outside when it is very hot, cold, or humid.  Avoid people who have a sickness you can catch (contagious).  Keep all follow-up visits as told by your doctor. This is important. Contact a doctor if:  You cough up more mucus than usual.  There is a change in the color or thickness of the mucus.  It is harder to breathe than usual.  Your breathing is faster than usual.  You have trouble sleeping.  You need to use your medicines more often than usual.  You have trouble doing your normal activities such as getting dressed or walking around the house. Get help right away if:  You have shortness of breath while resting.  You have shortness of breath that stops you from: ? Being able to talk. ? Doing normal activities.  Your chest hurts for longer than 5 minutes.  Your skin color is more blue than usual.  Your pulse oximeter shows that you have low oxygen for longer than 5 minutes.  You have a fever.  You feel too tired to breathe normally. Summary  Chronic obstructive pulmonary disease (COPD) is a long-term lung problem.  The way your lungs work will never return to normal. Usually the condition gets worse over time. There are things you can do to keep yourself as healthy as possible.  Take over-the-counter and prescription medicines only as told by your  doctor.  If you smoke, stop. Smoking makes the problem worse. This information is not intended to replace advice given to you by your health care provider. Make sure you discuss any questions you have with your health care provider. Document Released: 03/19/2008 Document Revised: 03/08/2016 Document Reviewed: 05/28/2013 Elsevier Interactive Patient Education  2017 [employer].

## 2017-06-05 NOTE — Care Management Note (Signed)
Case Management Note  Patient Details  Name: Craig [name]. MRN: [id number] Date of Birth: 05/14/1951  Subjective/Objective:      Admitted to [facility] with the diagnosis of acute on chronic respiratory failure. Lives with girlfriend, Arsenio Loader. Sister is Katharine Look [phone]). Last seen Dr, Lovie Macadamia a couple of months ago. Prescriptions are filled at [facility] on [address]. Home oxygen per [place] x 5 years, 3 liters per nasal cannula continuous. No home health. No skilled facility. No other medical equipment in the home. No falls.Takes care of basic needs himself.                Action/Plan: Received referral for Hospice services in the home. Spoke with Mr. Roseboom at the bedside. [place]. Doreatha Lew, RN representative for [facility] updated.   Expected Discharge Date:  06/06/17               Expected Discharge Plan:     In-House Referral:     Discharge planning Services     Post Acute Care Choice:    Choice offered to:     DME Arranged:    DME Agency:     HH Arranged:   yes HH Agency:   [facility]  Status of Service:     If discussed at [employer] of Stay Meetings, dates discussed:    Additional Comments:  Shelbie Ammons, [place] Management [phone] 06/05/2017, 8:35 AM

## 2017-06-05 NOTE — Progress Notes (Signed)
Daily Progress Note   Patient Name: Craig [name].       Date: 06/05/2017 DOB: 09/08/1951  Age: 66 y.o. MRN#: [id number] Attending Physician: Loletha Grayer, MD Primary Care Physician: [name], MD Admit Date: 06/01/2017  Reason for Consultation/Follow-up: Establishing goals of care  Subjective/GOC: Visited with patient multiple times throughout the day. Sister Craig [name]) at bedside. This afternoon, patient with respiratory distress/tachypnea. Unable to eat lunch because he is working hard to breath. He has been declining scheduled morphine. We discussed use of morphine to relieve dyspnea/air hunger and allow him to function with end-stage COPD. Patient does not like how it "makes me fuzzy." I decreased scheduled Roxanol to 26m SL q6h scheduled. Hopefully this will make him feel less "fuzzy" but able to breath. He can also have Roxanol 165mSL q1h prn. Patient with minimal relief after SL given. Instructed RN to give 32m5mV Morphine.   Craig [name] have relief after SL and IV morphine. [name] I encouraged patient to take scheduled morphine to prevent episodes of respiratory distress. He understands he can also request medicine every hour if needed for dyspnea.  Hospice liaison has met with patient and sister this afternoon and arranging hospice services at home. Likely tomorrow if stable.   Therapeutic listening and emotional/spiritual support provided throughout the day.   Length of Stay: 3  Current Medications: Scheduled Meds:  . budesonide  0.25 mg Nebulization Q6H  . cefUROXime  500 mg Oral BID WC  . chlorhexidine  15 mL Mouth Rinse BID  . docusate sodium  100 mg Oral BID  . feeding supplement (ENSURE ENLIVE)  1 Bottle Oral TID  . heparin subcutaneous  5,000 Units  Subcutaneous Q8H  . ipratropium-albuterol  3 mL Nebulization Q6H  . mouth rinse  15 mL Mouth Rinse q12n4p  . morphine CONCENTRATE  5 mg Sublingual Q6H  . predniSONE  60 mg Oral Q breakfast  . sodium chloride flush  3 mL Intravenous Q12H    Continuous Infusions:   PRN Meds: acetaminophen **OR** acetaminophen, albuterol, bisacodyl, glycopyrrolate, heparin lock flush, magnesium citrate, morphine injection, morphine CONCENTRATE, [DISCONTINUED] ondansetron **OR** ondansetron (ZOFRAN) IV, oxyCODONE, senna-docusate, sodium chloride flush, zolpidem  Physical Exam  Constitutional: He is oriented to person, place, and time. He appears ill.  HENT:  Head: Normocephalic and  atraumatic.  Cardiovascular: Regular rhythm.   Pulmonary/Chest: Accessory muscle usage present. Tachypnea noted. He is in respiratory distress. He has decreased breath sounds. He has wheezes.  Audible secretions--RN at bedside giving prn morphine  Abdominal: Normal appearance.  Neurological: He is alert and oriented to person, place, and time.  Skin: Skin is warm and dry.  Psychiatric: He has a normal mood and affect. His speech is normal and behavior is normal. Cognition and memory are normal.  Nursing note and vitals reviewed.          Vital Signs: BP 128/62 (BP Location: Right Arm)   Pulse (!) 118   Temp 98.1 F (36.7 C) (Oral)   Resp (!) 22   Ht _0  (1.727 m)   Wt 62.4 kg (137 lb 9.1 oz)   SpO2 98%   BMI 20.92 kg/m  SpO2: SpO2: 98 % O2 Device: O2 Device: Nasal Cannula O2 Flow Rate: O2 Flow Rate (L/min): 6 L/min  Intake/output summary:   Intake/Output Summary (Last 24 hours) at 06/05/17 1538 Last data filed at 06/05/17 1503  Gross per 24 hour  Intake                0 ml  Output             2100 ml  Net            -2100 ml   LBM: Last BM Date: 06/01/17 Baseline Weight: Weight: 61.2 kg (135 lb) Most recent weight: Weight: 62.4 kg (137 lb 9.1 oz)       Palliative Assessment/Data: PPS 30%   Flowsheet  Rows     Most Recent Value  Intake Tab  Referral Department  Critical care  Unit at Time of Referral  ICU  Palliative Care Primary Diagnosis  Sepsis/Infectious Disease  Palliative Care Type  New Palliative care  Reason for referral  Clarify Goals of Care  Date first seen by Palliative Care  06/03/17  Clinical Assessment  Palliative Performance Scale Score  30%  Psychosocial & Spiritual Assessment  Palliative Care Outcomes  Patient/Family meeting held?  Yes  Who was at the meeting?  patient and sister  Palliative Care Outcomes  Clarified goals of care, Provided end of life care assistance, Provided psychosocial or spiritual support, Counseled regarding hospice, Improved non-pain symptom therapy, Improved pain interventions      Patient Active Problem List   Diagnosis Date Noted  . Pneumonia due to infectious organism   . Palliative care by specialist   . Goals of care, counseling/discussion   . Acute on chronic respiratory failure ([place]) 06/02/2017  . Protein-calorie malnutrition, severe 03/02/2017  . Sepsis ([place]) 12/07/2016  . Portacath in place 11/05/2016  . Cancer associated pain 08/22/2016  . Anemia in neoplastic disease 08/22/2016  . Tachycardia 08/22/2016  . 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 Chronic bronchitis ([place]) 06/02/2016  . Chronic renal insufficiency 06/02/2016  . Benign essential HTN 06/02/2016  . Diabetes ([place]) 06/02/2016  . Primary cancer of right upper lobe of lung ([place]) 06/01/2016  . Renal carcinoma ([place]) 02/29/2016  . Cancer of lung ([place]) 12/21/2015  . COPD exacerbation ([place]) 11/19/2015  . Acute bronchitis 11/19/2015  . Shortness of breath 11/19/2015  . Acute respiratory distress 11/19/2015  . Glaucoma 04/15/2015  . 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 BP (high blood pressure) 03/02/2015  . Cannot sleep 03/02/2015  . Controlled type 2 diabetes mellitus without complication ([place]) 25/42/7062  . Lung cancer, upper  lobe ([place])  02/03/2015  . Renal cell cancer ([place]) 02/03/2015  . 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 male  with past medical history of COPD, oxygen dependent, stage IV lung cancer, colon and renal cancer, hypertension, GERD, diabetes mellitus, and CVA admitted on 06/01/2017 with respiratory distress. Recent hospitalization for sepsis, HCAP, and COPD exacerbation. Hx of stage IV lung Cancer was recently on ibrutibib. Oncology notes reviewed. Unable to perform CTA to r/o PE due to worsening kidney function. Currently on BiPAP. Receiving antibiotics, nebulized bronchodilators, steroids for recurrent RLL pneumonia and COPD exacerbation. Palliative medicine consultation for goals of care.   Assessment: Acute on chronic respiratory failure COPD exacerbation Stage IV lung cancer Diabetes mellitus Severe hyperkalemia  Recommendations/Plan:  DNR/DNI  Focus on comfort and symptom management with goal of getting patient home with hospice services.   Encouraged patient to take scheduled Roxanol 26m SL q6h in order to prevent episodes of respiratory distress. He understands to ask for more morphine as needed.   Hospice services at home on discharge. Likely tomorrow.   Goals of Care and Additional Recommendations:  Comfort focus. No escalation of care. Treat dyspnea with morphine. Continue bronchodilators, steroids, and antibiotics until discharged.   Code Status: DNR/DNI   Code Status Orders        Start     Ordered   06/02/17 0947  Do not attempt resuscitation (DNR)  Continuous    Question Answer Comment  In the event of cardiac or respiratory ARREST Do not call a "code blue"   In the event of cardiac or respiratory ARREST Do not perform Intubation, CPR, defibrillation or ACLS   In the event of cardiac or respiratory ARREST Use medication by any route, position, wound care, and other measures to relive pain and suffering. May  use oxygen, suction and manual treatment of airway obstruction as needed for comfort.      06/02/17 0946    Code Status History    Date Active Date Inactive Code Status Order ID Comments User Context   06/02/2017  3:03 AM 06/02/2017  9:46 AM Full Code [id number] Hugelmeyer, AUbaldo Glassing DO Inpatient   05/16/2017  4:45 PM 05/19/2017  5:38 PM Full Code [id number] GNicholes Mango MD ED   02/28/2017 11:26 AM 03/02/2017  1:13 PM Full Code [id number] KEpifanio Lesches MD ED   01/30/2017  6:19 PM 02/01/2017  3:17 PM Full Code [id number] SHillary Bow MD ED   12/07/2016  4:43 PM 12/10/2016  8:50 PM Full Code [id number] MBettey Costa MD Inpatient   07/02/2016  3:01 PM 07/03/2016  3:55 PM DNR [id number] SHillary Bow MD ED   05/31/2016  6:48 PM 06/02/2016  3:41 PM DNR [id number] GNicholes Mango MD Inpatient   11/19/2015  7:53 PM 11/20/2015  2:12 PM Full Code [id number] SIdelle Crouch MD Inpatient    Advance Directive Documentation     Most Recent Value  Type of Advance Directive  Healthcare Power of [facility]ed [place] [facility] will  Pre-existing out of facility DNR order (yellow form or pink MOST form)  -  "MOST" Form in Place?  -       Prognosis:   Unable to determine: poor prognosis with acute on chronic respiratory failure secondary to severe COPD, pneumonia, and stage IV lung cancer. High risk for decompensation requiring intubation. Patient wishes for DNR/DNI and focus on comfort.   Discharge Planning:  Home  with Hospice  Care plan was discussed with patient, RN, sister, RN CM, hospice liaison, and during multidisciplinary team rounds.    Thank you for allowing the Palliative Medicine Team to assist in the care of this patient.   Time In: 1250- 1530- Time Out: 1355 1540 Total Time 73mn Prolonged Time Billed yes      Greater than 50%  of this time was spent counseling and coordinating care related to the above assessment and plan.  MIhor Dow FNP-C Palliative Medicine Team  Phone: [phone]:  [phone] Please contact Palliative Medicine Team phone at [phone] questions and concerns.

## 2017-06-05 NOTE — Consult Note (Signed)
New referral for hospice services at home post palliative medicine consult on 66yo gentleman who was admitted to [facility] on 8.18.18 with Dx COPD exacerbation/acute respiratory failure with hypoxia.  He was initially admitted on Bi-Pap, but has weaned to 6L oxygen via [place].  He has medical history of stage IV lung cancer, lymphoma, DM, colon & renal cancer, CVA, asthma, GERD and COPD.  He has home oxygen at 3L/[place] prior to this hospitalization.  I met with Craig [name] to discuss hospice services.  He verbally consents to wanting services started at discharge.  Current DME in the home:  Oxygen through [employer] (He is on board with changing companies to [facility]).  DME needs:  Hospital bed, oxygen concentrator, portable tanks, over bed table.  He plans discharge today.  Information faxed to referral intake.  He will need oxygen concentrator that goes to 6L.  Information on face sheet is correct.  Patient lives with significant other, Craig [name] who can be reached at [phone] and should be the contact for delivery of the DME.  Updated hospital information faxed to referral intake.  Thank you for allowing participation in this patient's care.   Dimas Aguas, RN [facility]

## 2017-06-05 NOTE — Progress Notes (Signed)
Pt Denies pain during the shift. O2 sats in the mid 90's on 3L O2 per [place] as at home. Resp in the high 20's. Rest of VSS. Scheduled morphine SL given. Morphine SL and morphine IV prn given once for sob with improvement. Attempted to remove foley earlier today, but pt requested to remove it later today. Foley removed at 1615.

## 2017-06-05 NOTE — Patient Outreach (Signed)
[facility]) Care Management  06/05/2017  Rambo Sarafian. 29-Oct-1950 [id number]  Received message from [facility] RN hospital liaison, Merlene Morse, patient will be getting discharged with home hospice.  [place] was attempting to work with patient for medication patient assistance.    Plan:  Updated [place].   Will close pharmacy episode.   Karrie Meres, PharmD, [place] [phone]

## 2017-06-05 NOTE — Care Management (Signed)
Discharge to home today per Dr. Leslye Peer  Transportation will be arranged per [facility]. Shelbie Ammons RN MSN CCM Care Management [phone]

## 2017-06-06 ENCOUNTER — Encounter: Payer: Self-pay | Admitting: *Deleted

## 2017-06-06 ENCOUNTER — Other Ambulatory Visit: Payer: Self-pay | Admitting: *Deleted

## 2017-06-06 LAB — CBC
HCT: 24.8 % — ABNORMAL LOW (ref 40.0–52.0)
Hemoglobin: 8 g/dL — ABNORMAL LOW (ref 13.0–18.0)
MCH: 28.8 pg (ref 26.0–34.0)
MCHC: 32.3 g/dL (ref 32.0–36.0)
MCV: 89.3 fL (ref 80.0–100.0)
PLATELETS: 147 10*3/uL — AB (ref 150–440)
RBC: 2.78 MIL/uL — AB (ref 4.40–5.90)
RDW: 14.5 % (ref 11.5–14.5)
WBC: 6.9 10*3/uL (ref 3.8–10.6)

## 2017-06-06 MED ORDER — DOCUSATE SODIUM 100 MG PO CAPS
100.0000 mg | ORAL_CAPSULE | Freq: Two times a day (BID) | ORAL | Status: DC
  Administered 2017-06-06: 11:00:00 100 mg via ORAL
  Filled 2017-06-06: qty 1

## 2017-06-06 MED ORDER — DOCUSATE SODIUM 100 MG PO CAPS: 100.0000 mg | ORAL_CAPSULE | Freq: Two times a day (BID) | ORAL | 0 refills | Status: AC

## 2017-06-06 NOTE — Patient Outreach (Addendum)
This RN CM received an in basket on  06/05/17 from [place] pt to be discharged with home hospice/to  discharge pt from [facility] CM services.    Plan:  RN CM to inform Dr. Lovie Macadamia of discharge, send case closure letter.             Plan to inform [facility] care management assistant to close case.   [name].   [place] Care Management  [phone]

## 2017-06-06 NOTE — Progress Notes (Signed)
Pt is being discharged home with hospice. Discharge papers given and explained to pt and pt's sister, Sandra(POA). Both verbalized understanding. Meds reviewed. RX given. Pt to be discharge with foley catheter. Educated pt sister about foley care with back demonstration, verbalized understanding. Awaiting EMS.

## 2017-06-06 NOTE — Patient Outreach (Signed)
Case closure letter to be sent to Dr. Lovie Macadamia.- pt to discharge with home hospice.    Craig [name].   [facility]  [phone]

## 2017-06-06 NOTE — Discharge Summary (Signed)
[facility] NAME: Craig [name]    MR#:  [id number]  DATE OF BIRTH:  1951-08-09  DATE OF ADMISSION:  06/01/2017 ADMITTING PHYSICIAN: Harvie Bridge, DO  DATE OF DISCHARGE: 06/06/2017  1:43 PM  PRIMARY CARE PHYSICIAN: [name], MD    ADMISSION DIAGNOSIS:  Shortness of breath [R06.02] Respiratory distress [R06.03] Hypoxia [R09.02] COPD exacerbation (HCC) [J44.1] Acute respiratory failure with hypoxia and hypercapnia (HCC) [J96.01, J96.02]  DISCHARGE DIAGNOSIS:  Active Problems:   Acute on chronic respiratory failure ([place])   Pneumonia due to infectious organism   Palliative care by specialist   Goals of care, counseling/discussion   SECONDARY DIAGNOSIS:   Past Medical History:  Diagnosis Date  . Asthma   . Cancer ([place])   . Colon cancer ([place])   . COPD (chronic obstructive pulmonary disease) ([place])   . Diabetes mellitus without complication ([place])   . Difficulty sleeping   . Dysrhythmia   . GERD (gastroesophageal reflux disease)   . Glaucoma   . History of bronchitis   . Hypertension   . 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 Lymphoma (HCC)    low grade  . Renal cancer ([place])   . Respiratory failure ([place]) 07/12/2010   acute  . Right renal mass   . Shortness of breath dyspnea   . Stroke ([place]) 1990'S   NO RESIDUAL PROBLEMS  . Supplemental oxygen dependent    USES 3 LITERS CONTINUOUSLY    HOSPITAL COURSE:   1. Acute on chronic respiratory failure. Patient on 3 L nasal cannula. I asked the nurse to try to taper down to 2 L nasal cannula. Initially required BiPAP and ICU monitoring. 2. COPD exacerbation with audible wheeze. Patient will go home with hospice. Overall prognosis is poor. DuoNeb nebulizer solution and budesonide nebulizer solution prescribed to make it easier for him to breathe. Roxanol for ease of breathing. LABA not prescribed secondary to going home with  hospice 3. History of lung cancer. Going home with hospice 4. Hyperkalemia. This has improved 5. Acute kidney injury. This has improved 6. Urinary retention. Patient will go home with Foley catheter. Foley catheter to be changed by hospice nurse every 3 weeks. Foley catheter was changed 06/05/2017 7. history of type 2 diabetes mellitus. Sugars in the 150s. Stop oral medication secondary to poor appetite  DISCHARGE CONDITIONS:   Home with hospice  CONSULTS OBTAINED:   seen by critical care specialist and palliative care  DRUG ALLERGIES:   Allergies  Allergen Reactions  . Iodinated Diagnostic Agents Shortness Of Breath and Nausea And Vomiting    13-hour prep  . Nexium [Esomeprazole Magnesium] Other (See Comments)    headache    DISCHARGE MEDICATIONS:   Discharge Medication List as of 06/06/2017 11:05 AM    START taking these medications   Details  docusate sodium (COLACE) 100 MG capsule Take 1 capsule (100 mg total) by mouth 2 (two) times daily., Starting Thu 06/06/2017, Print    Morphine Sulfate (MORPHINE CONCENTRATE) 10 MG/0.5ML SOLN concentrated solution Place 0.5 mLs (10 mg total) under the tongue every 3 (three) hours as needed for severe pain., Starting Wed 06/05/2017, Print      CONTINUE these medications which have CHANGED   Details  albuterol (PROVENTIL) (2.5 MG/3ML) 0.083% nebulizer solution Take 3 mLs by nebulization every 6 (six) hours., Starting Wed 06/05/2017, Print    budesonide (PULMICORT) 0.25 MG/2ML nebulizer solution Take 2  mLs (0.25 mg total) by nebulization 2 (two) times daily., Starting Wed 06/05/2017, Until Thu 06/20/2017, Print    cefUROXime (CEFTIN) 500 MG tablet Take 1 tablet (500 mg total) by mouth 2 (two) times daily with a meal., Starting Wed 06/05/2017, Print    predniSONE (DELTASONE) 10 MG tablet 4 tabs po daily for 5 days, Print      CONTINUE these medications which have NOT CHANGED   Details  feeding supplement, ENSURE ENLIVE, (ENSURE ENLIVE)  LIQD Take 237 mLs by mouth 2 (two) times daily between meals., Starting Sat 03/02/2017, Normal    Pediatric Multivitamins-Iron (MULTIPLE VITAMINS-IRON PO) Take by mouth. One a day, Historical Med    zolpidem (AMBIEN) 10 MG tablet Take 10 mg by mouth at bedtime., Starting Thu 05/09/2017, Historical Med    albuterol (PROVENTIL HFA;VENTOLIN HFA) 108 (90 BASE) MCG/ACT inhaler Inhale 2 puffs into the lungs every 6 (six) hours as needed for wheezing or shortness of breath. , Historical Med    diphenhydrAMINE (BENADRYL) 25 MG tablet Take 1 tablet (25 mg total) by mouth as needed for itching or allergies., Starting Sat 03/02/2017, OTC    guaiFENesin (MUCINEX) 600 MG 12 hr tablet Take 1 tablet (600 mg total) by mouth 2 (two) times daily., Starting Sun 05/19/2017, Normal      STOP taking these medications     budesonide-formoterol (SYMBICORT) 160-4.5 MCG/ACT inhaler      HYDROcodone-acetaminophen (NORCO/VICODIN) 5-325 MG tablet      losartan-hydrochlorothiazide (HYZAAR) 50-12.5 MG per tablet      pioglitazone (ACTOS) 30 MG tablet      pioglitazone (ACTOS) 30 MG tablet      tiotropium (SPIRIVA HANDIHALER) 18 MCG inhalation capsule      tiotropium (SPIRIVA) 18 MCG inhalation capsule      dextromethorphan (DELSYM) 30 MG/5ML liquid      dextromethorphan-guaiFENesin (MUCINEX DM) 30-600 MG 12hr tablet      predniSONE (STERAPRED UNI-PAK 21 TAB) 10 MG (21) TBPK tablet          DISCHARGE INSTRUCTIONS:   Follow-up with hospice at home  If you experience worsening of your admission symptoms, develop shortness of breath, life threatening emergency, suicidal or homicidal thoughts you must seek medical attention immediately by calling 911 or calling your MD immediately  if symptoms less severe.  You Must read complete instructions/literature along with all the possible adverse reactions/side effects for all the Medicines you take and that have been prescribed to you. Take any new Medicines after you  have completely understood and accept all the possible adverse reactions/side effects.   Please note  You were cared for by a hospitalist during your hospital stay. If you have any questions about your discharge medications or the care you received while you were in the hospital after you are discharged, you can call the unit and asked to speak with the hospitalist on call if the hospitalist that took care of you is not available. Once you are discharged, your primary care physician will handle any further medical issues. Please note that NO REFILLS for any discharge medications will be authorized once you are discharged, as it is imperative that you return to your primary care physician (or establish a relationship with a primary care physician if you do not have one) for your aftercare needs so that they can reassess your need for medications and monitor your lab values.    Today   CHIEF COMPLAINT:   Chief Complaint  Patient presents with  . Respiratory  Distress    HISTORY OF PRESENT ILLNESS:  Craig [name]  is a 66 y.o. male presenting with respiratory distress   VITAL SIGNS:  Blood pressure (!) 141/67, pulse (!) 102, temperature 98.6 F (37 C), temperature source Oral, resp. rate (!) 29, height 5\' 8"  (1.727 m), weight 62.4 kg (137 lb 9.1 oz), SpO2 100 %.   PHYSICAL EXAMINATION:  GENERAL:  66 y.o.-year-old patient lying in the bed with Use of accessory muscles to breathe and tachypnea.  EYES: Pupils equal, round, reactive to light and accommodation. No scleral icterus. Extraocular muscles intact.  HEENT: Head atraumatic, normocephalic. Oropharynx and nasopharynx clear.  NECK:  Supple, no jugular venous distention. No thyroid enlargement, no tenderness.  LUNGS: Decreased breath sounds bilaterally, audible wheezing heard throughout entire lung field even without a stethoscope. Positive use of accessory muscles of respiration.  CARDIOVASCULAR: S1, S2 tachycardic. No murmurs, rubs, or  gallops.  ABDOMEN: Soft, non-tender, non-distended. Bowel sounds present. No organomegaly or mass.  EXTREMITIES: No pedal edema, cyanosis, or clubbing.  NEUROLOGIC: Cranial nerves II through XII are intact. Gait not checked.  PSYCHIATRIC: The patient is alert and oriented x 3.  SKIN: No obvious rash, lesion, or ulcer.   DATA REVIEW:   CBC  Recent Labs Lab 06/06/17 0541  WBC 6.9  HGB 8.0*  HCT 24.8*  PLT 147*    Chemistries   Recent Labs Lab 06/02/17 0419  06/03/17 0306  06/05/17 0815  NA 137  < > 134*  < > 142  K 5.5*  < > 6.0*  < > 4.9  CL 90*  < > 92*  < > 91*  CO2 38*  < > 36*  < > 43*  GLUCOSE 191*  < > 102*  < > 212*  BUN 24*  < > 42*  < > 31*  CREATININE 1.79*  < > 1.60*  < > 1.00  CALCIUM 8.6*  < > 8.4*  < > 9.2  MG 1.8  --  1.7  --   --   AST 28  --   --   --   --   ALT 24  --   --   --   --   ALKPHOS 60  --   --   --   --   BILITOT 0.3  --   --   --   --   < > = values in this interval not displayed.  Cardiac Enzymes  Recent Labs Lab 06/01/17 2141  TROPONINI <0.03    Microbiology Results  Results for orders placed or performed during the hospital encounter of 06/01/17  MRSA PCR Screening     Status: None   Collection Time: 06/02/17  2:35 AM  Result Value Ref Range Status   MRSA by PCR NEGATIVE NEGATIVE Final    Comment:        The GeneXpert MRSA Assay (FDA approved for NASAL specimens only), is one component of a comprehensive MRSA colonization surveillance program. It is not intended to diagnose MRSA infection nor to guide or monitor treatment for MRSA infections.       Management plans discussed with the patient, family and they are in agreement.  CODE STATUS:     Code Status Orders        Start     Ordered   06/02/17 0947  Do not attempt resuscitation (DNR)  Continuous    Question Answer Comment  In the event of cardiac or respiratory ARREST Do not call  a "code blue"   In the event of cardiac or respiratory ARREST Do not  perform Intubation, CPR, defibrillation or ACLS   In the event of cardiac or respiratory ARREST Use medication by any route, position, wound care, and other measures to relive pain and suffering. May use oxygen, suction and manual treatment of airway obstruction as needed for comfort.      06/02/17 0946    Code Status History    Date Active Date Inactive Code Status Order ID Comments User Context   06/02/2017  3:03 AM 06/02/2017  9:46 AM Full Code [id number]  Hugelmeyer, Ubaldo Glassing, DO Inpatient   05/16/2017  4:45 PM 05/19/2017  5:38 PM Full Code [id number]  Nicholes Mango, MD ED   02/28/2017 11:26 AM 03/02/2017  1:13 PM Full Code [id number]  [name], MD ED   01/30/2017  6:19 PM 02/01/2017  3:17 PM Full Code [id number]  Hillary Bow, MD ED   12/07/2016  4:43 PM 12/10/2016  8:50 PM Full Code [id number]  Bettey Costa, MD Inpatient   07/02/2016  3:01 PM 07/03/2016  3:55 PM DNR [id number]  Hillary Bow, MD ED   05/31/2016  6:48 PM 06/02/2016  3:41 PM DNR [id number]  Nicholes Mango, MD Inpatient   11/19/2015  7:53 PM 11/20/2015  2:12 PM Full Code [id number]  Idelle Crouch, MD Inpatient    Advance Directive Documentation     Most Recent Value  Type of Advance Directive  Healthcare Power of [place], Living will  Pre-existing out of facility DNR order (yellow form or pink MOST form)  -  "MOST" Form in Place?  -      TOTAL TIME TAKING CARE OF THIS PATIENT: 35 minutes.    Loletha Grayer M.D on 06/06/2017 at 4:15 PM  Between 7am to 6pm - Pager - [phone]  After 6pm go to [email or web address] - password [email or web address] [facility]  [phone]  CC: Primary care physician; [name], MD

## 2017-06-12 ENCOUNTER — Ambulatory Visit: Payer: Self-pay | Admitting: *Deleted

## 2017-06-15 DEATH — deceased

## 2017-06-18 ENCOUNTER — Ambulatory Visit: Payer: Medicare HMO

## 2017-06-20 ENCOUNTER — Ambulatory Visit: Payer: Self-pay | Admitting: *Deleted

## 2017-06-25 ENCOUNTER — Other Ambulatory Visit: Payer: Medicare HMO

## 2017-06-27 ENCOUNTER — Other Ambulatory Visit: Payer: Medicare HMO

## 2017-07-26 ENCOUNTER — Other Ambulatory Visit: Payer: Self-pay | Admitting: Nurse Practitioner

## 2018-02-20 IMAGING — CT CT GUIDANCE TISSUE ABLATION
2 of 15 series · 10 of 32 positions shown, 15 images · non-contrast
Comparison: none

CLINICAL DATA: Biopsy proven and enlarging right clear cell renal
carcinoma. The patient is not a candidate for surgical resection.
After discussing risks and benefits of treatment, he has chosen to
pursue cryoablation of the tumor.

[Series 2: ablation · axial · 0.74mm/px · z∈[-277,-127]mm · 5 of 46 slices shown, 10 images (1 of 2)]
[im 8/46  soft-tissue]
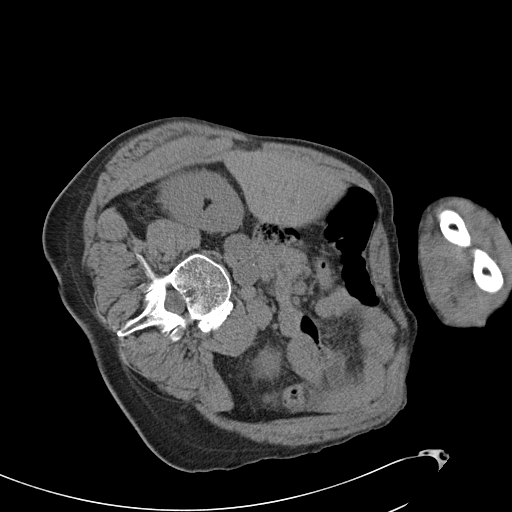
[im 8/46  bone]
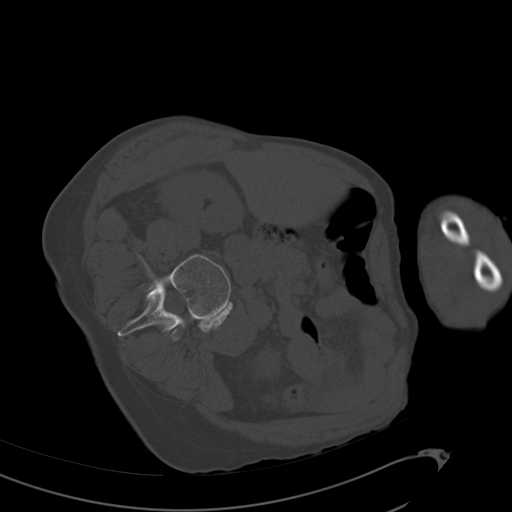
[im 16/46  soft-tissue]
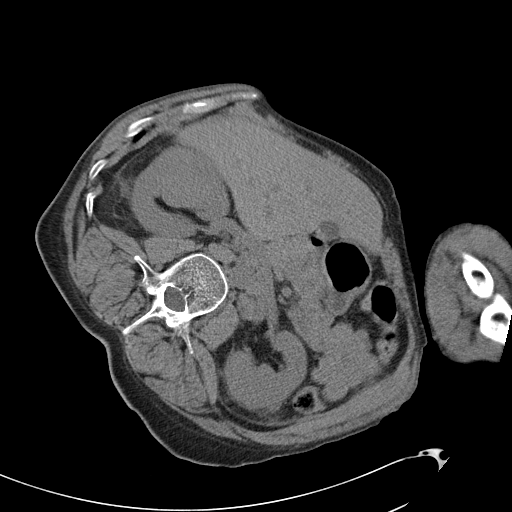
[im 16/46  lung]
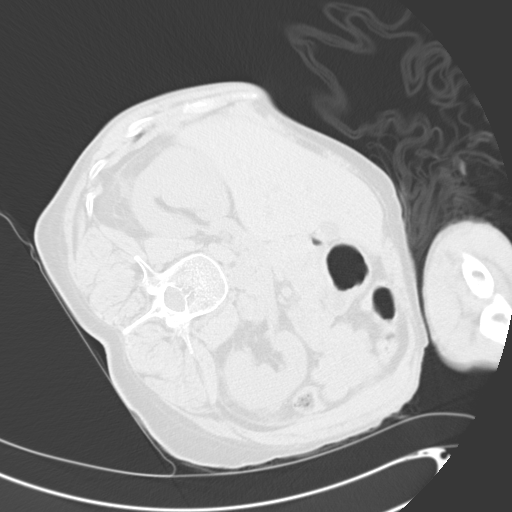
[im 23/46  soft-tissue]
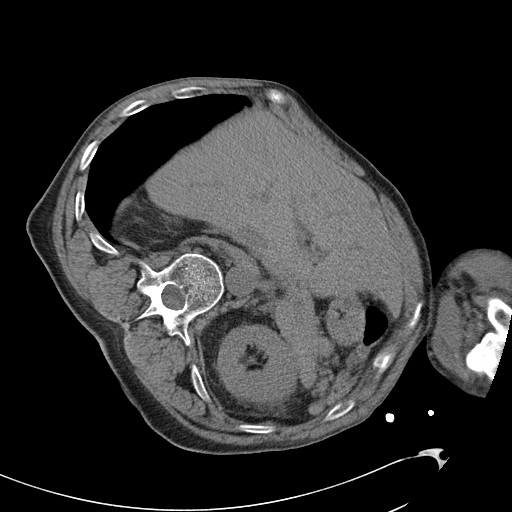
[im 23/46  lung]
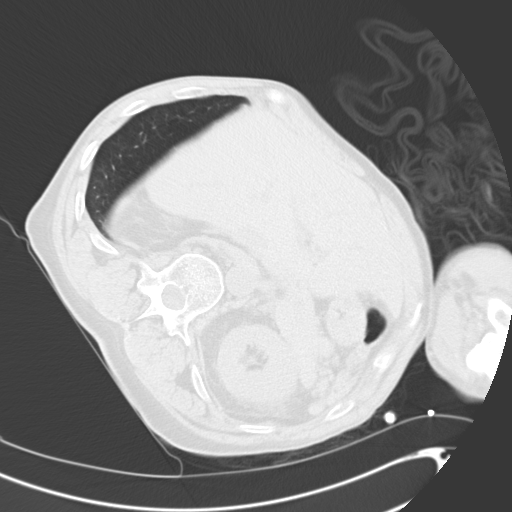
[im 31/46  soft-tissue]
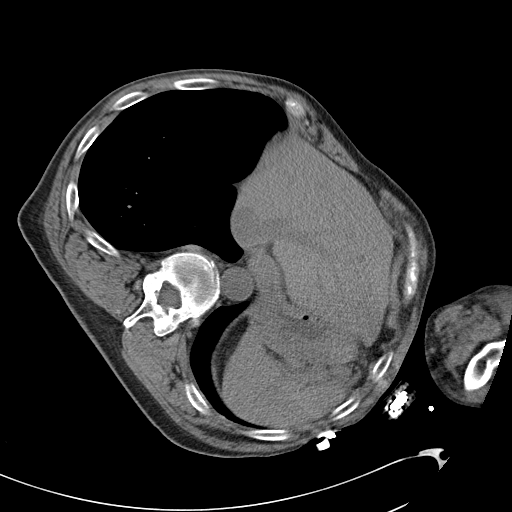
[im 31/46  lung]
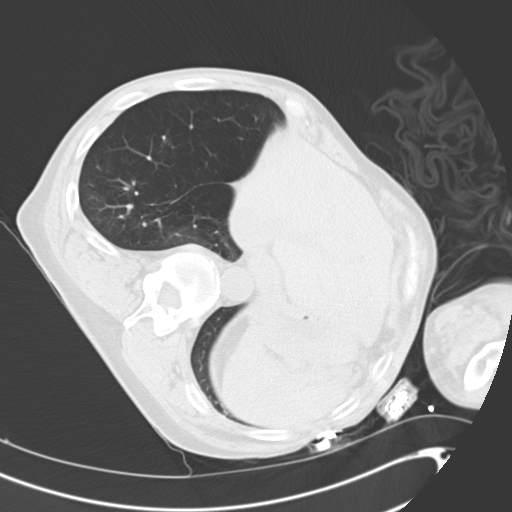
[im 38/46  soft-tissue]
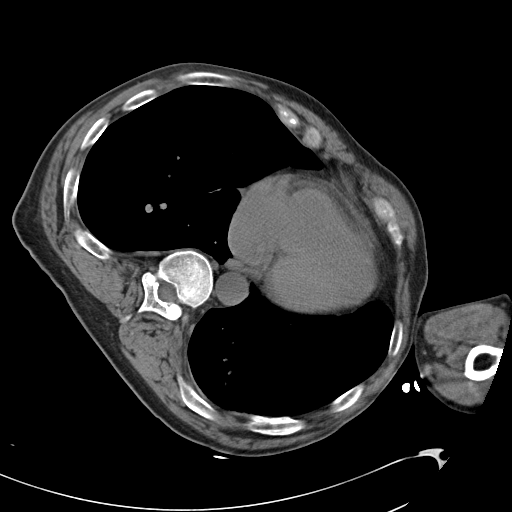
[im 38/46  lung]
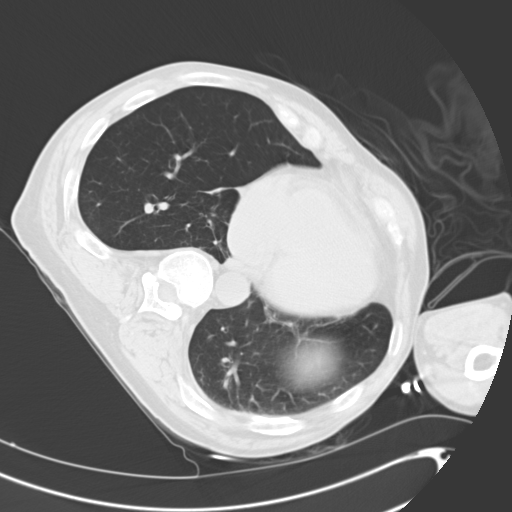

[Series 3: ablation · axial · 0.74mm/px · z∈[-267,-127]mm · 5 of 44 slices shown (2 of 2)]
[im 8/44  soft-tissue]
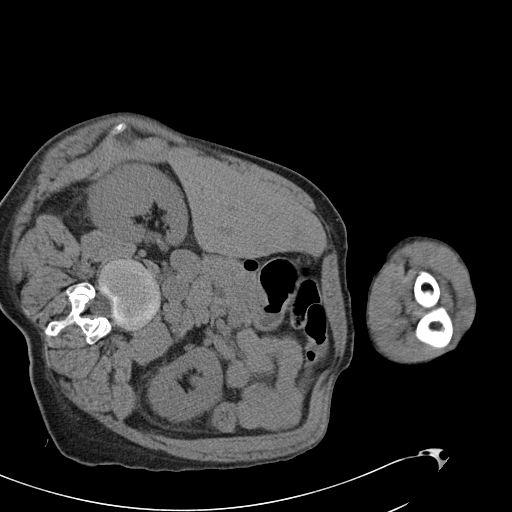
[im 15/44  soft-tissue]
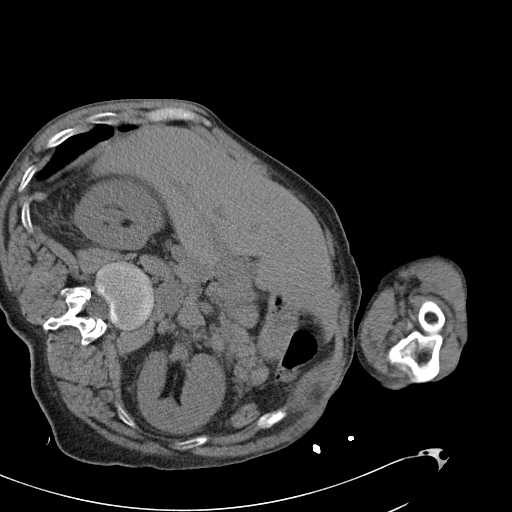
[im 22/44  soft-tissue]
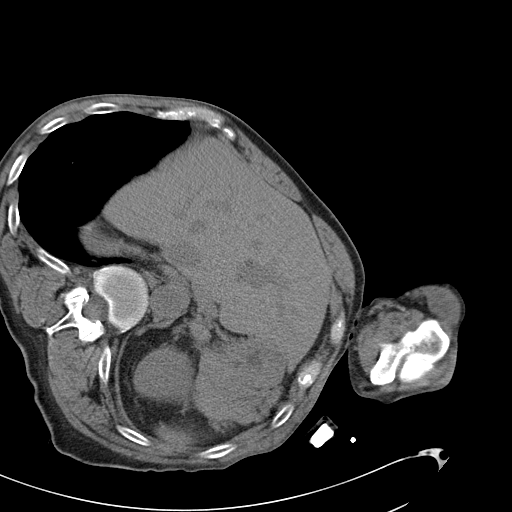
[im 29/44  soft-tissue]
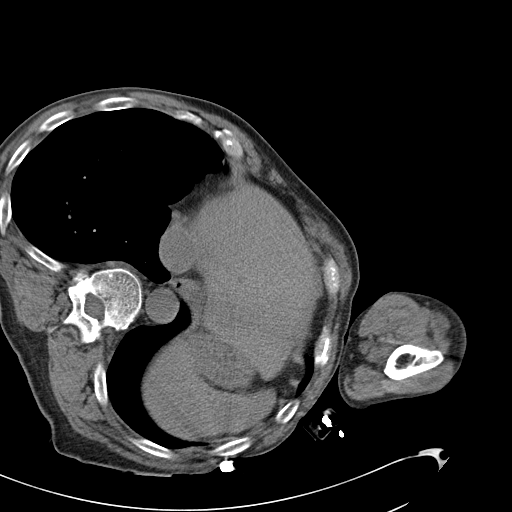
[im 36/44  soft-tissue]
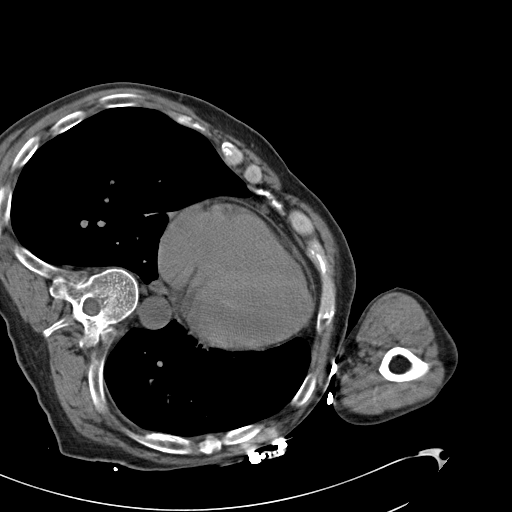

[10 of 32 positions shown; findings below may reference images not displayed]

EXAM:
CT-GUIDED PERCUTANEOUS CRYOABLATION OF RIGHT RENAL CARCINOMA

ANESTHESIA/SEDATION:
General

MEDICATIONS:
2 g IV Ancef.

CONTRAST:  No IV contrast was administered.

PROCEDURE:
The procedure, risks, benefits, and alternatives were explained to
the patient. Questions regarding the procedure were encouraged and
answered. The patient understands and consents to the procedure.

The patient was placed under general anesthesia. A time-out was
performed. Initial unenhanced CT was performed in a supine position
with the right side rolled up slightly to localize the right renal
carcinoma.

The patient was prepped with Betadine in a sterile fashion, and a
sterile drape was applied covering the operative field. A sterile
gown and sterile gloves were used for the procedure.

Under combined ultrasound and CT guidance, a total of four [place] [employer]
[place] percutaneous cryoablation probes were advanced into the right
renal tumor. Probe positioning was confirmed by CT prior to
cryoablation. Hydrodissection was then performed via a 22 gauge
spinal needle. A mixture of 250 mL sterile saline with 5 mL of
contrast was mixed. Approximately 160 mL of this fluid was utilized
for hydrodissection with injection of fluid between the right kidney
and liver.

Cryoablation was performed through the 4 probes simultaneously.
Initial 10 minute cycle of cryoablation was performed. This was
followed by a 6 minute thaw cycle. A second 10 minute cycle of
cryoablation was then performed. During ablation, periodic CT
imaging was performed to monitor ice ball formation and morphology.
After active thaw, the cryoablation probes were removed.

COMPLICATIONS:
None
FINDINGS: The superior to mid right renal mass was visible by ultrasound and
unenhanced CT. Maximal tumor diameter is approximately 4.8 cm.
Hydrodissection was performed to create space between the
anterolateral margin of the mass and the inferior liver capsule.
Monitoring during cryoablation demonstrates adequate ice ball
formation encompassing the mass.
IMPRESSION: CT guided percutaneous cryoablation of right renal carcinoma. The
patient will be observed overnight. Initial follow-up will be
performed in approximately 4 weeks.
